# Patient Record
Sex: Female | Born: 1955 | Race: Black or African American | Hispanic: No | Smoking: Former smoker
Health system: Southern US, Community
[De-identification: ages and names within clinical notes are randomized; demographics above are authoritative.]

## PROBLEM LIST (undated history)

## (undated) DIAGNOSIS — I671 Cerebral aneurysm, nonruptured: Secondary | ICD-10-CM

## (undated) DIAGNOSIS — M1990 Unspecified osteoarthritis, unspecified site: Secondary | ICD-10-CM

## (undated) DIAGNOSIS — F329 Major depressive disorder, single episode, unspecified: Secondary | ICD-10-CM

## (undated) DIAGNOSIS — I10 Essential (primary) hypertension: Secondary | ICD-10-CM

## (undated) DIAGNOSIS — F32A Depression, unspecified: Secondary | ICD-10-CM

## (undated) HISTORY — PX: BRAIN SURGERY: SHX531

---

## 2011-01-01 ENCOUNTER — Emergency Department (INDEPENDENT_AMBULATORY_CARE_PROVIDER_SITE_OTHER)
Admission: EM | Admit: 2011-01-01 | Discharge: 2011-01-01 | Disposition: A | Discharge status: Home / Self Care | Payer: Medicare Other | Source: Home / Self Care | Attending: Family Medicine | Admitting: Family Medicine

## 2011-01-01 ENCOUNTER — Encounter: Payer: Self-pay | Admitting: *Deleted

## 2011-01-01 DIAGNOSIS — E119 Type 2 diabetes mellitus without complications: Secondary | ICD-10-CM

## 2011-01-01 DIAGNOSIS — I10 Essential (primary) hypertension: Secondary | ICD-10-CM

## 2011-01-01 HISTORY — DX: Essential (primary) hypertension: I10

## 2011-01-01 HISTORY — DX: Major depressive disorder, single episode, unspecified: F32.9

## 2011-01-01 HISTORY — DX: Cerebral aneurysm, nonruptured: I67.1

## 2011-01-01 HISTORY — DX: Depression, unspecified: F32.A

## 2011-01-01 MED ORDER — METFORMIN HCL 850 MG PO TABS: 850.0000 mg | ORAL_TABLET | Freq: Two times a day (BID) | ORAL | Status: DC

## 2011-01-01 NOTE — ED Notes (Signed)
Requesting refill for Metformin - new to [place], no PCP, almost out of med.  Has not checked CBGs.

## 2011-01-01 NOTE — ED Provider Notes (Signed)
Linda Chase is a 55 year old woman with past medical history significant for hypertension, diabetes, depression and, history of cerebral aneurysm status post surgery who presents to clinic tonight to refill her metformin.  She moved from [place] one and a half months ago and is about to run out of metformin. She has no complaints today and feels well otherwise. She can't remember last blood sugars have been. She denies any polyuria or polydipsia or symptomatic hypoglycemic episodes. Additionally she denies any history of renal disease. She has tried: Several doctors in town to get an appointment however cannot find one that is taking new patients.    PMH reviewed.  ROS as above otherwise neg Medications reviewed. No current facility-administered medications for this encounter.   Current Outpatient Prescriptions  Medication Sig Dispense Refill  . HYDROCHLOROTHIAZIDE PO Take by mouth.        . labetalol (NORMODYNE) 200 MG tablet Take 200 mg by mouth 2 (two) times daily.        . metFORMIN (GLUCOPHAGE) 850 MG tablet Take 850 mg by mouth 2 (two) times daily with a meal.        . UNKNOWN TO PATIENT Antidepressant & pain pill for arthritis       . metFORMIN (GLUCOPHAGE) 850 MG tablet Take 1 tablet (850 mg total) by mouth 2 (two) times daily with a meal.  60 tablet  2    Exam:  BP 157/83  Pulse 82  Temp(Src) 98.7 F (37.1 C) (Oral)  Resp 20  SpO2 100% Gen: Well NAD, obese HEENT: EOMI,  MMM Lungs: CTABL Nl WOB Heart: RRR no MRG Abd: NABS, NT, ND Exts: Non edematous BL  LE, warm and well perfused.   Assessment and plan: 55 year old with diabetes and hypertension. 1) Will refill metformin for diabetes. Encouraged her to establish with primary care provider. 2) encourage her to continue taking her hypertension medicines and followup with her primary care provider.  Provided her the number to [facility] to get a doctor.  Handout on diabetes and hypertension given  Clementeen Graham 01/01/11  1950

## 2011-01-02 NOTE — ED Provider Notes (Signed)
Medical screening examination/treatment/procedure(s) were performed by resident physician and as supervising physician I was immediately available for consultation/collaboration.   [facility]; MD   Sharin Grave, MD 01/02/11 [phone]

## 2011-04-22 ENCOUNTER — Other Ambulatory Visit (HOSPITAL_COMMUNITY): Payer: Self-pay | Admitting: Internal Medicine

## 2011-04-22 DIAGNOSIS — Z1231 Encounter for screening mammogram for malignant neoplasm of breast: Secondary | ICD-10-CM

## 2011-05-19 ENCOUNTER — Ambulatory Visit (HOSPITAL_COMMUNITY)
Admission: RE | Admit: 2011-05-19 | Discharge: 2011-05-19 | Disposition: A | Discharge status: Home / Self Care | Payer: Medicare Other | Source: Ambulatory Visit | Attending: Internal Medicine | Admitting: Internal Medicine

## 2011-05-19 DIAGNOSIS — Z1231 Encounter for screening mammogram for malignant neoplasm of breast: Secondary | ICD-10-CM | POA: Insufficient documentation

## 2012-02-01 ENCOUNTER — Emergency Department (HOSPITAL_COMMUNITY): Payer: Medicare Other

## 2012-02-01 ENCOUNTER — Encounter (HOSPITAL_COMMUNITY): Payer: Self-pay | Admitting: Emergency Medicine

## 2012-02-01 ENCOUNTER — Inpatient Hospital Stay (HOSPITAL_COMMUNITY)
Admission: EM | Admit: 2012-02-01 | Discharge: 2012-02-05 | DRG: 871 | Disposition: A | Discharge status: Home / Self Care | Payer: Medicare Other | Attending: Internal Medicine | Admitting: Internal Medicine

## 2012-02-01 ENCOUNTER — Inpatient Hospital Stay (HOSPITAL_COMMUNITY): Payer: Medicare Other

## 2012-02-01 DIAGNOSIS — A419 Sepsis, unspecified organism: Principal | ICD-10-CM | POA: Diagnosis present

## 2012-02-01 DIAGNOSIS — E669 Obesity, unspecified: Secondary | ICD-10-CM | POA: Diagnosis present

## 2012-02-01 DIAGNOSIS — F329 Major depressive disorder, single episode, unspecified: Secondary | ICD-10-CM

## 2012-02-01 DIAGNOSIS — R6510 Systemic inflammatory response syndrome (SIRS) of non-infectious origin without acute organ dysfunction: Secondary | ICD-10-CM | POA: Diagnosis present

## 2012-02-01 DIAGNOSIS — F17200 Nicotine dependence, unspecified, uncomplicated: Secondary | ICD-10-CM | POA: Diagnosis present

## 2012-02-01 DIAGNOSIS — J101 Influenza due to other identified influenza virus with other respiratory manifestations: Secondary | ICD-10-CM

## 2012-02-01 DIAGNOSIS — R0689 Other abnormalities of breathing: Secondary | ICD-10-CM

## 2012-02-01 DIAGNOSIS — J9602 Acute respiratory failure with hypercapnia: Secondary | ICD-10-CM | POA: Diagnosis present

## 2012-02-01 DIAGNOSIS — J9620 Acute and chronic respiratory failure, unspecified whether with hypoxia or hypercapnia: Secondary | ICD-10-CM | POA: Diagnosis present

## 2012-02-01 DIAGNOSIS — Z6841 Body Mass Index (BMI) 40.0 and over, adult: Secondary | ICD-10-CM

## 2012-02-01 DIAGNOSIS — E662 Morbid (severe) obesity with alveolar hypoventilation: Secondary | ICD-10-CM | POA: Diagnosis present

## 2012-02-01 DIAGNOSIS — I10 Essential (primary) hypertension: Secondary | ICD-10-CM | POA: Diagnosis present

## 2012-02-01 DIAGNOSIS — J449 Chronic obstructive pulmonary disease, unspecified: Secondary | ICD-10-CM

## 2012-02-01 DIAGNOSIS — G4733 Obstructive sleep apnea (adult) (pediatric): Secondary | ICD-10-CM | POA: Diagnosis present

## 2012-02-01 DIAGNOSIS — J441 Chronic obstructive pulmonary disease with (acute) exacerbation: Secondary | ICD-10-CM | POA: Diagnosis present

## 2012-02-01 DIAGNOSIS — R0902 Hypoxemia: Secondary | ICD-10-CM | POA: Diagnosis present

## 2012-02-01 DIAGNOSIS — R5381 Other malaise: Secondary | ICD-10-CM | POA: Diagnosis present

## 2012-02-01 DIAGNOSIS — F32A Depression, unspecified: Secondary | ICD-10-CM | POA: Diagnosis present

## 2012-02-01 DIAGNOSIS — J9600 Acute respiratory failure, unspecified whether with hypoxia or hypercapnia: Secondary | ICD-10-CM

## 2012-02-01 DIAGNOSIS — E119 Type 2 diabetes mellitus without complications: Secondary | ICD-10-CM | POA: Diagnosis present

## 2012-02-01 LAB — CBC WITH DIFFERENTIAL/PLATELET
Basophils Relative: 0 % (ref 0–1)
Hemoglobin: 11.9 g/dL — ABNORMAL LOW (ref 12.0–15.0)
Lymphocytes Relative: 8 % — ABNORMAL LOW (ref 12–46)
Lymphs Abs: 0.8 10*3/uL (ref 0.7–4.0)
MCHC: 32.2 g/dL (ref 30.0–36.0)
Monocytes Relative: 6 % (ref 3–12)
Neutro Abs: 8.6 10*3/uL — ABNORMAL HIGH (ref 1.7–7.7)
Neutrophils Relative %: 86 % — ABNORMAL HIGH (ref 43–77)
RBC: 4.37 MIL/uL (ref 3.87–5.11)
WBC: 10 10*3/uL (ref 4.0–10.5)

## 2012-02-01 LAB — BLOOD GAS, ARTERIAL
Bicarbonate: 26.9 mEq/L — ABNORMAL HIGH (ref 20.0–24.0)
Bicarbonate: 28.2 mEq/L — ABNORMAL HIGH (ref 20.0–24.0)
Delivery systems: POSITIVE
FIO2: 0.4 %
O2 Saturation: 94.8 %
O2 Saturation: 93.7 %
Patient temperature: 98.6
Patient temperature: 98.6
TCO2: 24.6 mmol/L (ref 0–100)
TCO2: 26 mmol/L (ref 0–100)
pH, Arterial: 7.316 — ABNORMAL LOW (ref 7.350–7.450)

## 2012-02-01 LAB — URINALYSIS, MICROSCOPIC ONLY
Glucose, UA: NEGATIVE mg/dL
Hgb urine dipstick: NEGATIVE
Specific Gravity, Urine: 1.027 (ref 1.005–1.030)
pH: 5.5 (ref 5.0–8.0)

## 2012-02-01 LAB — COMPREHENSIVE METABOLIC PANEL
Albumin: 3.2 g/dL — ABNORMAL LOW (ref 3.5–5.2)
Alkaline Phosphatase: 67 U/L (ref 39–117)
BUN: 12 mg/dL (ref 6–23)
Chloride: 97 mEq/L (ref 96–112)
Potassium: 3.8 mEq/L (ref 3.5–5.1)
Total Bilirubin: 0.3 mg/dL (ref 0.3–1.2)

## 2012-02-01 LAB — GLUCOSE, CAPILLARY: Glucose-Capillary: 163 mg/dL — ABNORMAL HIGH (ref 70–99)

## 2012-02-01 LAB — RAPID URINE DRUG SCREEN, HOSP PERFORMED
Benzodiazepines: NOT DETECTED
Cocaine: NOT DETECTED

## 2012-02-01 MED ORDER — LISINOPRIL 40 MG PO TABS
40.0000 mg | ORAL_TABLET | Freq: Every morning | ORAL | Status: DC
  Administered 2012-02-02 – 2012-02-05 (×4): 40 mg via ORAL
  Filled 2012-02-01 (×4): qty 1

## 2012-02-01 MED ORDER — SODIUM CHLORIDE 0.9 % IV SOLN: 250.0000 mL | INTRAVENOUS | Status: DC | PRN

## 2012-02-01 MED ORDER — IPRATROPIUM BROMIDE 0.02 % IN SOLN
0.5000 mg | Freq: Four times a day (QID) | RESPIRATORY_TRACT | Status: DC
  Administered 2012-02-01 – 2012-02-02 (×5): 0.5 mg via RESPIRATORY_TRACT
  Filled 2012-02-01 (×5): qty 2.5

## 2012-02-01 MED ORDER — ALBUTEROL SULFATE (5 MG/ML) 0.5% IN NEBU
2.5000 mg | INHALATION_SOLUTION | RESPIRATORY_TRACT | Status: DC
  Administered 2012-02-01 – 2012-02-03 (×8): 2.5 mg via RESPIRATORY_TRACT
  Filled 2012-02-01 (×8): qty 0.5

## 2012-02-01 MED ORDER — ACETAMINOPHEN 650 MG RE SUPP: 650.0000 mg | Freq: Four times a day (QID) | RECTAL | Status: DC | PRN

## 2012-02-01 MED ORDER — DEXTROSE 5 % IV SOLN
1.0000 g | Freq: Two times a day (BID) | INTRAVENOUS | Status: DC
  Administered 2012-02-02: 1 g via INTRAVENOUS
  Filled 2012-02-01: qty 1

## 2012-02-01 MED ORDER — IOHEXOL 350 MG/ML SOLN
100.0000 mL | Freq: Once | INTRAVENOUS | Status: AC | PRN
Start: 2012-02-01 — End: 2012-02-01
  Administered 2012-02-01: 100 mL via INTRAVENOUS

## 2012-02-01 MED ORDER — INSULIN ASPART 100 UNIT/ML ~~LOC~~ SOLN
0.0000 [IU] | Freq: Three times a day (TID) | SUBCUTANEOUS | Status: DC
  Administered 2012-02-02 (×3): 3 [IU] via SUBCUTANEOUS
  Administered 2012-02-03: 5 [IU] via SUBCUTANEOUS
  Administered 2012-02-03 (×2): 3 [IU] via SUBCUTANEOUS

## 2012-02-01 MED ORDER — SODIUM CHLORIDE 0.9 % IV SOLN
INTRAVENOUS | Status: DC
  Administered 2012-02-01: 17:00:00 via INTRAVENOUS

## 2012-02-01 MED ORDER — SODIUM CHLORIDE 0.9 % IV SOLN
INTRAVENOUS | Status: DC
  Administered 2012-02-01 (×2): via INTRAVENOUS

## 2012-02-01 MED ORDER — SODIUM CHLORIDE 0.9 % IJ SOLN
3.0000 mL | Freq: Two times a day (BID) | INTRAMUSCULAR | Status: DC
  Administered 2012-02-01 – 2012-02-05 (×3): 3 mL via INTRAVENOUS

## 2012-02-01 MED ORDER — FUROSEMIDE 10 MG/ML IJ SOLN
40.0000 mg | Freq: Once | INTRAMUSCULAR | Status: AC
  Administered 2012-02-01: 40 mg via INTRAVENOUS
  Filled 2012-02-01: qty 4

## 2012-02-01 MED ORDER — NICOTINE 21 MG/24HR TD PT24
21.0000 mg | MEDICATED_PATCH | Freq: Every day | TRANSDERMAL | Status: DC
  Administered 2012-02-01 – 2012-02-05 (×5): 21 mg via TRANSDERMAL
  Filled 2012-02-01 (×5): qty 1

## 2012-02-01 MED ORDER — ACETAMINOPHEN 325 MG PO TABS
650.0000 mg | ORAL_TABLET | Freq: Four times a day (QID) | ORAL | Status: DC | PRN
  Administered 2012-02-01 – 2012-02-03 (×2): 650 mg via ORAL
  Filled 2012-02-01 (×2): qty 2

## 2012-02-01 MED ORDER — DEXTROSE 5 % IV SOLN
1.0000 g | Freq: Once | INTRAVENOUS | Status: AC
  Administered 2012-02-01: 1 g via INTRAVENOUS
  Filled 2012-02-01 (×2): qty 1

## 2012-02-01 MED ORDER — DEXTROSE 5 % IV SOLN: 1.0000 g | Freq: Two times a day (BID) | INTRAVENOUS | Status: DC

## 2012-02-01 MED ORDER — SODIUM CHLORIDE 0.9 % IJ SOLN
3.0000 mL | INTRAMUSCULAR | Status: DC | PRN
  Administered 2012-02-04: 3 mL via INTRAVENOUS

## 2012-02-01 MED ORDER — SODIUM CHLORIDE 0.9 % IV BOLUS (SEPSIS)
250.0000 mL | Freq: Once | INTRAVENOUS | Status: AC
  Administered 2012-02-01: 250 mL via INTRAVENOUS

## 2012-02-01 MED ORDER — VANCOMYCIN HCL 10 G IV SOLR
1250.0000 mg | Freq: Two times a day (BID) | INTRAVENOUS | Status: DC
  Administered 2012-02-02 – 2012-02-03 (×3): 1250 mg via INTRAVENOUS
  Filled 2012-02-01 (×3): qty 1250

## 2012-02-01 MED ORDER — ALBUTEROL SULFATE (5 MG/ML) 0.5% IN NEBU: 2.5000 mg | INHALATION_SOLUTION | RESPIRATORY_TRACT | Status: DC | PRN

## 2012-02-01 MED ORDER — ACETAMINOPHEN 325 MG PO TABS: 650.0000 mg | ORAL_TABLET | Freq: Four times a day (QID) | ORAL | Status: DC | PRN

## 2012-02-01 MED ORDER — ALBUTEROL SULFATE (5 MG/ML) 0.5% IN NEBU
2.5000 mg | INHALATION_SOLUTION | RESPIRATORY_TRACT | Status: DC
  Administered 2012-02-01: 2.5 mg via RESPIRATORY_TRACT

## 2012-02-01 MED ORDER — VANCOMYCIN HCL 10 G IV SOLR
2500.0000 mg | Freq: Once | INTRAVENOUS | Status: AC
  Administered 2012-02-01: 2500 mg via INTRAVENOUS
  Filled 2012-02-01: qty 2500

## 2012-02-01 MED ORDER — LABETALOL HCL 200 MG PO TABS
200.0000 mg | ORAL_TABLET | Freq: Two times a day (BID) | ORAL | Status: DC
  Administered 2012-02-01 – 2012-02-05 (×8): 200 mg via ORAL
  Filled 2012-02-01 (×10): qty 1

## 2012-02-01 MED ORDER — SODIUM CHLORIDE 0.9 % IJ SOLN
3.0000 mL | Freq: Two times a day (BID) | INTRAMUSCULAR | Status: DC
  Administered 2012-02-01 – 2012-02-04 (×3): 3 mL via INTRAVENOUS

## 2012-02-01 MED ORDER — METHYLPREDNISOLONE SODIUM SUCC 125 MG IJ SOLR
80.0000 mg | Freq: Three times a day (TID) | INTRAMUSCULAR | Status: DC
  Administered 2012-02-01 – 2012-02-03 (×5): 80 mg via INTRAVENOUS
  Filled 2012-02-01 (×8): qty 1.28

## 2012-02-01 MED ORDER — ENOXAPARIN SODIUM 40 MG/0.4ML ~~LOC~~ SOLN
40.0000 mg | SUBCUTANEOUS | Status: DC
  Administered 2012-02-01 – 2012-02-04 (×4): 40 mg via SUBCUTANEOUS
  Filled 2012-02-01 (×6): qty 0.4

## 2012-02-01 NOTE — ED Provider Notes (Signed)
History     CSN: [id number]  Arrival date & time 02/01/12  1115   First MD Initiated Contact with Patient 02/01/12 1146      Chief Complaint  Patient presents with  . Shortness of Breath    (Consider location/radiation/quality/duration/timing/severity/associated sxs/prior treatment) The history is provided by the patient. The history is limited by the condition of the patient.   patient 57 year old the female in by EMS for shortness of breath room air saturation was 88%. They placed her on oxygen oxygen level went up to 98%. Patient still breathing rapidly around 32 little tachycardic somnolent some limitation on the bili for answer questions however she will follow commands. Patient states that she started with a cough 2 days ago cut very short of breath today. Primary care doctor is Dr. Hanley Hays.  Past Medical History  Diagnosis Date  . Diabetes mellitus   . Depression   . Hypertension   . Cerebral aneurysm     x2    Past Surgical History  Procedure Date  . Brain surgery     double aneurysm    History reviewed. No pertinent family history.  History  Substance Use Topics  . Smoking status: Current Some Day Smoker  . Smokeless tobacco: Never Used  . Alcohol Use: No    OB History    Grav Para Term Preterm Abortions TAB SAB Ect Mult Living                  Review of Systems  Constitutional: Positive for fever and fatigue.  HENT: Negative for neck pain.   Eyes: Negative for visual disturbance.  Respiratory: Positive for shortness of breath.   Cardiovascular: Negative for chest pain.  Gastrointestinal: Negative for vomiting and abdominal pain.  Genitourinary: Negative for dysuria.  Musculoskeletal: Negative for back pain.  Skin: Negative for rash.  Neurological: Negative for headaches.  Hematological: Does not bruise/bleed easily.    Allergies  Review of patient's allergies indicates no known allergies.  Home Medications   Current Outpatient Rx  Name   Route  Sig  Dispense  Refill  . LABETALOL HCL 200 MG PO TABS   Oral   Take 200 mg by mouth 2 (two) times daily.           Marland Kitchen LISINOPRIL 40 MG PO TABS   Oral   Take 40 mg by mouth every morning.         Marland Kitchen METFORMIN HCL 850 MG PO TABS   Oral   Take 1,700 mg by mouth daily with breakfast.          . METFORMIN HCL 850 MG PO TABS   Oral   Take 1 tablet (850 mg total) by mouth 2 (two) times daily with a meal.   60 tablet   2     BP 160/90  Pulse 108  Temp 100.8 F (38.2 C) (Oral)  Resp 28  SpO2 100%  Physical Exam  Nursing note and vitals reviewed. Constitutional: She appears well-developed and well-nourished. No distress.  HENT:  Head: Normocephalic and atraumatic.  Eyes: Conjunctivae normal and EOM are normal. Pupils are equal, round, and reactive to light.  Neck: Normal range of motion.  Cardiovascular: Normal rate, regular rhythm and normal heart sounds.        Tachycardia around 108.  Pulmonary/Chest: Effort normal and breath sounds normal.  Abdominal: Soft. Bowel sounds are normal. There is no tenderness.  Musculoskeletal: Normal range of motion.  Neurological: She is alert.  Somnolent we'll follow commands.  Skin: Skin is warm. No rash noted.    ED Course  Procedures (including critical care time)  Labs Reviewed  CBC WITH DIFFERENTIAL - Abnormal; Notable for the following:    Hemoglobin 11.9 (*)     Neutrophils Relative 86 (*)     Neutro Abs 8.6 (*)     Lymphocytes Relative 8 (*)     All other components within normal limits  COMPREHENSIVE METABOLIC PANEL - Abnormal; Notable for the following:    Sodium 134 (*)     Glucose, Bld 148 (*)     Albumin 3.2 (*)     GFR calc non Af Amer 67 (*)     GFR calc Af Amer 78 (*)     All other components within normal limits  BLOOD GAS, ARTERIAL - Abnormal; Notable for the following:    pH, Arterial 7.347 (*)     pCO2 arterial 50.5 (*)     pO2, Arterial 76.8 (*)     Bicarbonate 26.9 (*)     All other  components within normal limits  URINALYSIS, MICROSCOPIC ONLY - Abnormal; Notable for the following:    Bacteria, UA FEW (*)     Squamous Epithelial / LPF FEW (*)     All other components within normal limits  GLUCOSE, CAPILLARY - Abnormal; Notable for the following:    Glucose-Capillary 148 (*)     All other components within normal limits  LACTIC ACID, PLASMA  URINE RAPID DRUG SCREEN (HOSP PERFORMED)  CULTURE, BLOOD (ROUTINE X 2)  CULTURE, BLOOD (ROUTINE X 2)  PRO B NATRIURETIC PEPTIDE   Dg Chest Portable 1 View  02/01/2012  *RADIOLOGY REPORT*  Clinical Data: Shortness of breath, hypertension  PORTABLE CHEST - 1 VIEW  Comparison: Portable exam 1152 hours without priors for comparison  Findings: Enlargement of cardiac silhouette. Tortuous aorta. Pulmonary vascular congestion. Lungs grossly clear without infiltrate, pleural effusion or pneumothorax. No acute osseous findings.  IMPRESSION: Enlargement of cardiac silhouette with minimal pulmonary vascular congestion. No acute abnormalities.   Original Report Authenticated By: Ulyses Southward, M.D.     Date: 02/01/2012  Rate: 108  Rhythm: sinus tachycardia  QRS Axis: normal  Intervals: normal  ST/T Wave abnormalities: normal  Conduction Disutrbances:left anterior fascicular block  Narrative Interpretation:   Old EKG Reviewed: none available    1. Hypoxia   2. Respiratory insufficiency     CRITICAL CARE Performed by: Shelda Jakes.   Total critical care time: 30  Critical care time was exclusive of separately billable procedures and treating other patients.  Critical care was necessary to treat or prevent imminent or life-threatening deterioration.  Critical care was time spent personally by me on the following activities: development of treatment plan with patient and/or surrogate as well as nursing, discussions with consultants, evaluation of patient's response to treatment, examination of patient, obtaining history from  patient or surrogate, ordering and performing treatments and interventions, ordering and review of laboratory studies, ordering and review of radiographic studies, pulse oximetry and re-evaluation of patient's condition.   MDM   The patient called EMS for shortness of breath and not feeling well. Rib at home on room air she was 88% she complaining of a cough for a couple days shortness of breath got worse today temperature here was 100.8 her primary care doctors Dr. August Luz. Patient does not normally use oxygen at home. Past medical history significant for diabetes and hypertension. Upon arrival to ED patient was breathing rapidly  about 32 times a minute. On oxygen she was satting 98%.  Blood gas was done which showed some mild acidosis and elevated PCO2 to 50. Patient placed on BiPAP and felt much better with significant improvement. Chest x-ray was negative for pneumonia. Urinalysis negative for urinary tract infection. Labs without significant abnormalities. No leukocytosis.  Discussed with the hospitalist team for admission they will admit to step down have added a BNP and CT angios for chest rule out pulmonary embolism for them. Also requested respiratory to do a repeat arterial blood gas.        Shelda Jakes, MD 02/01/12 [phone]

## 2012-02-01 NOTE — ED Notes (Signed)
Respiratory at bedside for eval

## 2012-02-01 NOTE — ED Notes (Signed)
EMS reports patient has had cough x 2 days.  Patient is diminished in the bases.  Patient denies fevers and pain.  Patient on 3L O2 via [place] upon arrival.

## 2012-02-01 NOTE — ED Notes (Signed)
Respiratory notified via phone

## 2012-02-01 NOTE — ED Notes (Signed)
Patient's RR 30, pulse 98, 99% on bipap.  Patient requesting to come off bipap.

## 2012-02-01 NOTE — ED Notes (Signed)
Patient transported to CT 

## 2012-02-01 NOTE — Progress Notes (Signed)
ANTIBIOTIC CONSULT NOTE - INITIAL  Pharmacy Consult for Vancomycin Indication: suspected pneumonia  No Known Allergies  Patient Measurements: Height: 5\' 4"  (162.6 cm) Weight: 279 lb 8.7 oz (126.8 kg) IBW/kg (Calculated) : 54.7  Adjusted Body Weight:   Vital Signs: Temp: 99.6 F (37.6 C) (01/21 1800) Temp src: Axillary (01/21 1800) BP: 172/96 mmHg (01/21 1800) Pulse Rate: 109  (01/21 1800) Intake/Output from previous day:   Intake/Output from this shift: Total I/O In: 500 [I.V.:500] Out: -   Labs:  Basename 02/01/12 1140  WBC 10.0  HGB 11.9*  PLT 240  LABCREA --  CREATININE 0.93   Estimated Creatinine Clearance: 89 ml/min (by C-G formula based on Cr of 0.93). No results found for this basename: VANCOTROUGH:2,VANCOPEAK:2,VANCORANDOM:2,GENTTROUGH:2,GENTPEAK:2,GENTRANDOM:2,TOBRATROUGH:2,TOBRAPEAK:2,TOBRARND:2,AMIKACINPEAK:2,AMIKACINTROU:2,AMIKACIN:2, in the last 72 hours   Microbiology: No results found for this or any previous visit (from the past 720 hour(s)).  Medical History: Past Medical History  Diagnosis Date  . Diabetes mellitus   . Depression   . Hypertension   . Cerebral aneurysm     x2    Medications:  Scheduled:    . sodium chloride   Intravenous STAT  . albuterol  2.5 mg Nebulization Q4H  . ceFEPime (MAXIPIME) IV  1 g Intravenous Once  . ceFEPime (MAXIPIME) IV  1 g Intravenous Q12H  . enoxaparin (LOVENOX) injection  40 mg Subcutaneous Q24H  . furosemide  40 mg Intravenous Once  . insulin aspart  0-15 Units Subcutaneous TID WC  . ipratropium  0.5 mg Nebulization Q6H  . labetalol  200 mg Oral BID  . lisinopril  40 mg Oral q morning - 10a  . methylPREDNISolone (SOLU-MEDROL) injection  80 mg Intravenous Q8H  . nicotine  21 mg Transdermal Daily  . [COMPLETED] sodium chloride  250 mL Intravenous Once  . [COMPLETED] sodium chloride  250 mL Intravenous Once  . sodium chloride  3 mL Intravenous Q12H  . sodium chloride  3 mL Intravenous Q12H  .  [DISCONTINUED] albuterol  2.5 mg Nebulization Q4H  . [DISCONTINUED] ceFEPime (MAXIPIME) IV  1 g Intravenous Q12H   Infusions:    . [DISCONTINUED] sodium chloride 100 mL/hr at 02/01/12 1645   PRN: sodium chloride, acetaminophen, acetaminophen, albuterol, [COMPLETED] iohexol, sodium chloride, [DISCONTINUED] acetaminophen, [DISCONTINUED] acetaminophen, [DISCONTINUED] albuterol Assessment:  57 yo F with suspected pneumonia  Sx-SOB,productive cough, chills x 2 days, temp 100.8 today  Goal of Therapy:  Vancomycin trough level 15-20 mcg/ml  Plan:  1.  Vancomycin loading dose 2500mg   ( for pt > 100kg) 2.  Vancomycin 1250mg  IV q 12 hours Measure antibiotic drug levels at steady state Follow up culture results(blood and sputum cultures)  Loletta Specter 02/01/2012,6:21 PM

## 2012-02-01 NOTE — H&P (Signed)
[facility] History and Physical  Linda Chase WUJ:[id number] DOB: [birth year] DOA: 02/01/2012  Referring physician: Dr. Deretha Emory PCP: Lonia Blood, MD   Chief Complaint:  Shortness of breath with productive cough and chills for 2 days  HPI:  57 year old obese African American female with history of hypertension, diabetes mellitus (on metformin), history of depression and cerebral aneurysm with repair presents with 2 day history of progressive shortness of breath at rest associated with cough with productive mucus. Patient informs being clearly days back when she suddenly started having the symptoms with dyspnea on rest associated with productive cough and chills. She denies any fever or body aches. Denies any headache, blurry vision, nasal congestion, chest pain, palpitations, abdominal pain, nausea, vomiting, bowel or urinary symptoms. She does complain of orthopnea. Denies any neck swellings. At baseline she is able to walk an hour a day. She denies monitoring her blood glucose regularly but he informs to be compliant with her medications. She continues to smoke one pack per day for past several years. In the ED patient was noted to be hypoxic in the 80s on room air and tachycardic. Patient was noted to have a temperature of 100.55F and tachypneic. She was placed on a BiPAP. A blood gas obtained showed pH of 7.3, PCO2 of 50.5 and PO2 of 78. Chest x-ray done showed minimal pulmonary vascular congestion. An EKG was unremarkable. [facility] called for admission to step down monitoring.  Review of Systems: (Positive symptoms in bold) Constitutional: Complains of chills, and fatigue, Denies fever, , diaphoresis, appetite change HEENT: Denies photophobia, eye pain, redness, hearing loss, ear pain, congestion, sore throat, rhinorrhea, sneezing, mouth sores, trouble swallowing, neck pain, neck stiffness and tinnitus.   Respiratory:  SOB, DOE, cough, chest tightness,  and wheezing.     Cardiovascular: Denies chest pain, palpitations and leg swelling.  Gastrointestinal: Denies nausea, vomiting, abdominal pain, diarrhea, constipation, blood in stool and abdominal distention.  Genitourinary: Denies dysuria, urgency, frequency, hematuria, flank pain and difficulty urinating.  Musculoskeletal: Denies myalgias, back pain, joint swelling, arthralgias and gait problem.  Skin: Denies pallor, rash and wound.  Neurological: Denies dizziness, seizures, syncope, weakness, light-headedness, numbness and headaches.  Hematological: Denies adenopathy. Easy bruising, personal or family bleeding history  Psychiatric/Behavioral: Denies suicidal ideation, mood changes, confusion, nervousness, sleep disturbance and agitation   Past Medical History  Diagnosis Date  . Diabetes mellitus   . Depression   . Hypertension   . Cerebral aneurysm     x2   Past Surgical History  Procedure Date  . Brain surgery     double aneurysm   Social History:  reports that she has been smoking.  She has never used smokeless tobacco. She reports that she does not drink alcohol or use illicit drugs.  No Known Allergies  History reviewed. No pertinent family history.  Prior to Admission medications   Medication Sig Start Date End Date Taking? Authorizing Provider  labetalol (NORMODYNE) 200 MG tablet Take 200 mg by mouth 2 (two) times daily.     Yes Historical Provider, MD  lisinopril (PRINIVIL,ZESTRIL) 40 MG tablet Take 40 mg by mouth every morning.   Yes Historical Provider, MD  metFORMIN (GLUCOPHAGE) 850 MG tablet Take 1,700 mg by mouth daily with breakfast.    Yes Historical Provider, MD  metFORMIN (GLUCOPHAGE) 850 MG tablet Take 1 tablet (850 mg total) by mouth 2 (two) times daily with a meal. 01/01/11 01/01/12  Rodolph Bong, MD    Physical Exam:  Ceasar Mons Vitals:  02/01/12 1117 02/01/12 1133 02/01/12 1329  BP:  160/84 160/90  Pulse:  107 108  Temp:  100.8 F (38.2 C)   TempSrc:  Oral   Resp:   32 28  SpO2: 99% 98% 100%    Constitutional: Vital signs reviewed.  Patient is an obese female lying in bed on nasal cannula with some respiratory distress and using accessory muscles of respiration. Head: Normocephalic and atraumatic Ear: TM normal bilaterally Mouth: no erythema or exudates, MMM Eyes: PERRL, EOMI, conjunctivae normal, No scleral icterus.  Neck: Supple, Trachea midline normal ROM, No JVD, mass, thyromegaly, or carotid bruit present.  Cardiovascular: RRR, S1 normal, S2 normal, no MRG, pulses symmetric and intact bilaterally Pulmonary/Chest: Increased work of breathing noted, diffuse wheezing bilaterally. Abdominal: Obese, Soft. Non-tender, non-distended, bowel sounds are normal, no masses, organomegaly, or guarding present.  GU: no CVA tenderness Musculoskeletal: No joint deformities, erythema, or stiffness, ROM full and no nontender Ext: Trace edema, no cyanosis, pulses palpable bilaterally (DP and PT) Hematology: no cervical, inginal, or axillary adenopathy.  Neurological: A&O x3, Strenght is normal and symmetric bilaterally, cranial nerve II-XII are grossly intact, no focal motor deficit, sensory intact to light touch bilaterally.  Skin: Warm, dry and intact. No rash, cyanosis, or clubbing.  Psychiatric: Normal mood and affect. speech and behavior is normal. Judgment and thought content normal. Cognition and memory are normal.   Labs on Admission:  Basic Metabolic Panel:  Lab 02/01/12 5784  NA 134*  K 3.8  CL 97  CO2 27  GLUCOSE 148*  BUN 12  CREATININE 0.93  CALCIUM 9.0  MG --  PHOS --   Liver Function Tests:  Lab 02/01/12 1140  AST 13  ALT 8  ALKPHOS 67  BILITOT 0.3  PROT 7.2  ALBUMIN 3.2*   No results found for this basename: LIPASE:5,AMYLASE:5 in the last 168 hours No results found for this basename: AMMONIA:5 in the last 168 hours CBC:  Lab 02/01/12 1140  WBC 10.0  NEUTROABS 8.6*  HGB 11.9*  HCT 36.9  MCV 84.4  PLT 240   Cardiac  Enzymes: No results found for this basename: CKTOTAL:5,CKMB:5,CKMBINDEX:5,TROPONINI:5 in the last 168 hours BNP: No components found with this basename: POCBNP:5 CBG:  Lab 02/01/12 1502  GLUCAP 148*    Radiological Exams on Admission: Dg Chest Portable 1 View  02/01/2012  *RADIOLOGY REPORT*  Clinical Data: Shortness of breath, hypertension  PORTABLE CHEST - 1 VIEW  Comparison: Portable exam 1152 hours without priors for comparison  Findings: Enlargement of cardiac silhouette. Tortuous aorta. Pulmonary vascular congestion. Lungs grossly clear without infiltrate, pleural effusion or pneumothorax. No acute osseous findings.  IMPRESSION: Enlargement of cardiac silhouette with minimal pulmonary vascular congestion. No acute abnormalities.   Original Report Authenticated By: Ulyses Southward, M.D.     EKG: Sinus tachycardia at 108, left anterior vascular block no old EKG to compare  Assessment/Plan Principal Problem:  *Acute respiratory failure with hypercapnia Patient admitted to step down for close monitoring She probably has a chronic respiratory failure with associated OSA/OHS and possible underlying COPD as well. -Blood gas showing hypercapnia. Chest x-ray in the ED unremarkable except for some pulmonary congestion. Ordered CT angio chest by  ED to rule out for PE. -Patient has been weaned off BiPAP in the ED and currently maintaining sats on 3-4 L. Will start her on IV steroid and scheduled and nebs. Given fever with associated shortness of breath and productive cough cannot rule out underlying pneumonia. Will cover her empirically  with IV vancomycin and cefepime. -Check flu PCR. -Will order nicotine patch. -Patient does give history of snoring and daytime sleepiness. Will need outpatient sleep studies. -Check proBNP. We'll obtain 2-D echo   Active Problems:  SIRS (systemic inflammatory response syndrome) Likely in the setting of underlying pneumonia/bronchitis. Check flu PCR. Patient  started empirically on IV vancomycin and Zosyn. Blood culture and sputum culture ordered. Check urine strep and Legionella antigen   Diabetes mellitus Hold metformin. Continue sliding scale insulin. Check hemoglobin A1c and lipid panel.   Hypertension Blood pressure stable. Continue home medications    Obesity Will get nutrition consult.  DVT prophylaxis: Subcutaneous Lovenox Diet: Diabetic  Code Status: full Family Communication: none at bedside Disposition Plan: home once stable  Eddie North [facility] Pager [phone]  If 7PM-7AM, please contact night-coverage [email or web address] Password [facility] 02/01/2012, 5:12 PM    Total time spent: 70 minutes

## 2012-02-01 NOTE — ED Notes (Signed)
Bed:WA12<BR> Expected date:<BR> Expected time:<BR> Means of arrival:<BR> Comments:<BR> ems 

## 2012-02-01 NOTE — ED Notes (Signed)
IV team at bedside 

## 2012-02-01 NOTE — ED Notes (Signed)
Hospitalist at bedside for eval.

## 2012-02-01 NOTE — ED Notes (Signed)
IV team paged.  

## 2012-02-01 NOTE — ED Notes (Signed)
RT notified of orders 

## 2012-02-02 DIAGNOSIS — F329 Major depressive disorder, single episode, unspecified: Secondary | ICD-10-CM

## 2012-02-02 DIAGNOSIS — R0902 Hypoxemia: Secondary | ICD-10-CM

## 2012-02-02 DIAGNOSIS — J449 Chronic obstructive pulmonary disease, unspecified: Secondary | ICD-10-CM

## 2012-02-02 DIAGNOSIS — I509 Heart failure, unspecified: Secondary | ICD-10-CM

## 2012-02-02 DIAGNOSIS — J441 Chronic obstructive pulmonary disease with (acute) exacerbation: Secondary | ICD-10-CM

## 2012-02-02 LAB — CBC
MCH: 26.8 pg (ref 26.0–34.0)
Platelets: 241 10*3/uL (ref 150–400)
RBC: 4.33 MIL/uL (ref 3.87–5.11)
RDW: 13.1 % (ref 11.5–15.5)
WBC: 8.4 10*3/uL (ref 4.0–10.5)

## 2012-02-02 LAB — GLUCOSE, CAPILLARY
Glucose-Capillary: 168 mg/dL — ABNORMAL HIGH (ref 70–99)
Glucose-Capillary: 194 mg/dL — ABNORMAL HIGH (ref 70–99)

## 2012-02-02 LAB — INFLUENZA PANEL BY PCR (TYPE A & B): Influenza B By PCR: NEGATIVE

## 2012-02-02 LAB — LEGIONELLA ANTIGEN, URINE: Legionella Antigen, Urine: NEGATIVE

## 2012-02-02 LAB — BASIC METABOLIC PANEL
Calcium: 8.9 mg/dL (ref 8.4–10.5)
Creatinine, Ser: 0.95 mg/dL (ref 0.50–1.10)
GFR calc non Af Amer: 66 mL/min — ABNORMAL LOW (ref >90)
Glucose, Bld: 177 mg/dL — ABNORMAL HIGH (ref 70–99)
Sodium: 139 mEq/L (ref 135–145)

## 2012-02-02 LAB — HIV ANTIBODY (ROUTINE TESTING W REFLEX): HIV: NONREACTIVE

## 2012-02-02 LAB — EXPECTORATED SPUTUM ASSESSMENT W GRAM STAIN, RFLX TO RESP C

## 2012-02-02 LAB — HEMOGLOBIN A1C: Hgb A1c MFr Bld: 7.3 % — ABNORMAL HIGH (ref <5.7)

## 2012-02-02 MED ORDER — VITAMINS A & D EX OINT
TOPICAL_OINTMENT | CUTANEOUS | Status: AC
  Administered 2012-02-02: via TOPICAL
  Filled 2012-02-02: qty 10

## 2012-02-02 MED ORDER — CEFEPIME HCL 2 G IJ SOLR
2.0000 g | Freq: Two times a day (BID) | INTRAMUSCULAR | Status: DC
  Administered 2012-02-02 – 2012-02-05 (×6): 2 g via INTRAVENOUS
  Filled 2012-02-02 (×7): qty 2

## 2012-02-02 MED ORDER — BIOTENE DRY MOUTH MT LIQD
15.0000 mL | Freq: Two times a day (BID) | OROMUCOSAL | Status: DC
  Administered 2012-02-02 – 2012-02-05 (×7): 15 mL via OROMUCOSAL

## 2012-02-02 MED ORDER — OSELTAMIVIR PHOSPHATE 75 MG PO CAPS
75.0000 mg | ORAL_CAPSULE | Freq: Two times a day (BID) | ORAL | Status: DC
  Administered 2012-02-02 – 2012-02-05 (×7): 75 mg via ORAL
  Filled 2012-02-02 (×9): qty 1

## 2012-02-02 NOTE — Progress Notes (Signed)
  Echocardiogram 2D Echocardiogram has been performed.  Zira Helinski 02/02/2012, 9:30 AM 

## 2012-02-02 NOTE — Progress Notes (Signed)
[facility] PROGRESS NOTE  Linda Chase XBJ:[id number] DOB: [birth year] DOA: 02/01/2012 PCP: Lonia Blood, MD  Assessment/Plan: Acute respiratory failure with hypercapnia  -Plan to transfer to telemetry unit if remains off of BiPAP -Likely due to COPD exacerbation -probably has a chronic respiratory failure with associated OSA/OHS and possible underlying COPD as well.  -Blood gas showing hypercapnia. Chest x-ray in the ED unremarkable except for some pulmonary congestion. -CT angiogram chest negative -Patient has been weaned off BiPAP in the ED  -Patient on BiPAP last night, and off this morning--monitor off -productive cough cannot rule out underlying pneumonia. Will cover her empirically with IV vancomycin and cefepime.  -Check flu PCR--awaiting results COPD exacerbation/continued tobacco use  -Will order nicotine patch.  -Patient does give history of snoring and daytime sleepiness. Will need outpatient sleep studies.  -Check proBNP. We'll obtain 2-D echo--pending -Tobacco cessation discussed SIRS (systemic inflammatory response syndrome)  Likely in the setting of underlying pneumonia/bronchitis.   -flu PCR--pending  -Patient started empirically on IV vancomycin and Zosyn. Blood culture and sputum culture ordered. -urine strep--- negative and Legionella antigen--pending  Diabetes mellitus  Hold metformin. Continue sliding scale insulin. Check hemoglobin A1c and lipid panel.  Hypertension  Blood pressure stable. Continue home medications  Obesity  nutrition consult.    Family Communication:   Pt at beside Disposition Plan:   Home when medically stable    Antibiotics:  Vancomycin/cefepime 02/01/2012>>>    Procedures/Studies: Ct Angio Chest Pe W/cm &/or Wo Cm  02/01/2012  *RADIOLOGY REPORT*  Clinical Data: Cough for 2 days.  CT ANGIOGRAPHY CHEST  Technique:  Multidetector CT imaging of the chest using the standard protocol during bolus administration of intravenous  contrast. Multiplanar reconstructed images including MIPs were obtained and reviewed to evaluate the vascular anatomy.  Contrast: OMNIPAQUE IOHEXOL 350 MG/ML SOLN  Comparison: Plain film chest 02/01/2012  Findings: The study is degraded by respiratory motion.  No pulmonary embolus is identified.  There is cardiomegaly.  No pleural or pericardial effusion.  There is no axillary lymphadenopathy.  Small right hilar node on image 34 measures 1.5 cm. No mediastinal lymphadenopathy is identified.  Lungs demonstrate only some dependent atelectatic change.  Incidentally imaged upper abdomen shows fatty infiltration of the liver.  No focal bony abnormality is identified.  IMPRESSION:  1.  Negative for pulmonary embolus or acute cardiopulmonary disease. 2.  Cardiomegaly. 3.  Fatty infiltration of the liver.   Original Report Authenticated By: Holley Dexter, M.D.    Dg Chest Portable 1 View  02/01/2012  *RADIOLOGY REPORT*  Clinical Data: Shortness of breath, hypertension  PORTABLE CHEST - 1 VIEW  Comparison: Portable exam 1152 hours without priors for comparison  Findings: Enlargement of cardiac silhouette. Tortuous aorta. Pulmonary vascular congestion. Lungs grossly clear without infiltrate, pleural effusion or pneumothorax. No acute osseous findings.  IMPRESSION: Enlargement of cardiac silhouette with minimal pulmonary vascular congestion. No acute abnormalities.   Original Report Authenticated By: Ulyses Southward, M.D.          Subjective: Patient has been off of BiPAP since 7 AM--no respiratory distress. Feels that she is breathing 50% better. Feels the cough is improved. Denies any chest discomfort, fevers, chills, nausea, vomiting, diarrhea, abdominal pain.  Objective: Filed Vitals:   02/02/12 0424 02/02/12 0630 02/02/12 0800 02/02/12 0830  BP:    133/73  Pulse:  84 88   Temp:   98 F (36.7 C)   TempSrc:   Oral   Resp:  22 25   Height:  Weight:      SpO2: 100% 100% 98%      Intake/Output Summary (Last 24 hours) at 02/02/12 1610 Last data filed at 02/02/12 0848  Gross per 24 hour  Intake   1160 ml  Output   2445 ml  Net  -1285 ml   Weight change:  Exam:   General:  Pt is alert, follows commands appropriately, not in acute distress  HEENT: No icterus, No thrush,  [place]/AT  Cardiovascular: RRR, S1/S2, no rubs, no gallops  Respiratory: Scattered expiratory wheezing with rales. Good air movement.  Abdomen: Soft/+BS, non tender, non distended, no guarding  Extremities: No edema, No lymphangitis, No petechiae, No rashes, no synovitis  Data Reviewed: Basic Metabolic Panel:  Lab 02/02/12 9604 02/01/12 1140  NA 139 134*  K 4.1 3.8  CL 102 97  CO2 29 27  GLUCOSE 177* 148*  BUN 16 12  CREATININE 0.95 0.93  CALCIUM 8.9 9.0  MG -- --  PHOS -- --   Liver Function Tests:  Lab 02/01/12 1140  AST 13  ALT 8  ALKPHOS 67  BILITOT 0.3  PROT 7.2  ALBUMIN 3.2*   No results found for this basename: LIPASE:5,AMYLASE:5 in the last 168 hours No results found for this basename: AMMONIA:5 in the last 168 hours CBC:  Lab 02/02/12 0335 02/01/12 1140  WBC 8.4 10.0  NEUTROABS -- 8.6*  HGB 11.6* 11.9*  HCT 37.3 36.9  MCV 86.1 84.4  PLT 241 240   Cardiac Enzymes: No results found for this basename: CKTOTAL:5,CKMB:5,CKMBINDEX:5,TROPONINI:5 in the last 168 hours BNP: No components found with this basename: POCBNP:5 CBG:  Lab 02/02/12 0719 02/01/12 2149 02/01/12 1502  GLUCAP 151* 163* 148*    Recent Results (from the past 240 hour(s))  CULTURE, BLOOD (ROUTINE X 2)     Status: Normal (Preliminary result)   Collection Time   02/01/12 11:40 AM      Component Value Range Status Comment   Specimen Description BLOOD RIGHT ANTECUBITAL   Final    Special Requests BOTTLES DRAWN AEROBIC ONLY   Final    Culture  Setup Time 02/01/2012 17:04   Final    Culture     Final    Value:        BLOOD CULTURE RECEIVED NO GROWTH TO DATE CULTURE WILL BE HELD FOR 5  DAYS BEFORE ISSUING A FINAL NEGATIVE REPORT   Report Status PENDING   Incomplete   CULTURE, BLOOD (ROUTINE X 2)     Status: Normal (Preliminary result)   Collection Time   02/01/12 11:45 AM      Component Value Range Status Comment   Specimen Description BLOOD LEFT ANTECUBITAL   Final    Special Requests BOTTLES DRAWN AEROBIC AND ANAEROBIC   Final    Culture  Setup Time 02/01/2012 17:04   Final    Culture     Final    Value:        BLOOD CULTURE RECEIVED NO GROWTH TO DATE CULTURE WILL BE HELD FOR 5 DAYS BEFORE ISSUING A FINAL NEGATIVE REPORT   Report Status PENDING   Incomplete   MRSA PCR SCREENING     Status: Normal   Collection Time   02/01/12  6:03 PM      Component Value Range Status Comment   MRSA by PCR NEGATIVE  NEGATIVE Final   CULTURE, EXPECTORATED SPUTUM-ASSESSMENT     Status: Normal   Collection Time   02/02/12  6:20 AM  Component Value Range Status Comment   Specimen Description SPUTUM   Final    Special Requests Normal   Final    Sputum evaluation     Final    Value: THIS SPECIMEN IS ACCEPTABLE. RESPIRATORY CULTURE REPORT TO FOLLOW.   Report Status 02/02/2012 FINAL   Final      Scheduled Meds:   . albuterol  2.5 mg Nebulization Q4H  . antiseptic oral rinse  15 mL Mouth Rinse BID  . ceFEPime (MAXIPIME) IV  1 g Intravenous Q12H  . enoxaparin (LOVENOX) injection  40 mg Subcutaneous Q24H  . insulin aspart  0-15 Units Subcutaneous TID WC  . ipratropium  0.5 mg Nebulization Q6H  . labetalol  200 mg Oral BID  . lisinopril  40 mg Oral q morning - 10a  . methylPREDNISolone (SOLU-MEDROL) injection  80 mg Intravenous Q8H  . nicotine  21 mg Transdermal Daily  . sodium chloride  3 mL Intravenous Q12H  . sodium chloride  3 mL Intravenous Q12H  . vancomycin  1,250 mg Intravenous Q12H   Continuous Infusions:    Linda Longhi, DO  [facility] Pager [phone]  If 7PM-7AM, please contact night-coverage [email or web address] Password [email or web address] 02/02/2012, 9:04 AM   LOS: 1  day

## 2012-02-02 NOTE — Progress Notes (Signed)
Dr. Arbutus Leas notified of lab results +H1N1 and influenza A  Linda Chase

## 2012-02-02 NOTE — Progress Notes (Signed)
ANTIBIOTIC CONSULT NOTE   Pharmacy Consult for Vancomycin/Cefepime Indication: suspected pneumonia/AECOPD  No Known Allergies  Patient Measurements: Height: 5\' 4"  (162.6 cm) Weight: 279 lb 8.7 oz (126.8 kg) IBW/kg (Calculated) : 54.7  Adjusted Body Weight:   Vital Signs: Temp: 98 F (36.7 C) (01/22 0800) Temp src: Oral (01/22 0800) BP: 138/70 mmHg (01/22 1032) Pulse Rate: 93  (01/22 1000) Intake/Output from previous day: 01/21 0701 - 01/22 0700 In: 744 [I.V.:690; IV Piggyback:54] Out: 2445 [Urine:2445] Intake/Output from this shift: Total I/O In: 511 [P.O.:240; I.V.:10; IV Piggyback:261] Out: -   Labs:  Basename 02/02/12 0335 02/01/12 1140  WBC 8.4 10.0  HGB 11.6* 11.9*  PLT 241 240  LABCREA -- --  CREATININE 0.95 0.93   Estimated Creatinine Clearance: 87.2 ml/min (by C-G formula based on Cr of 0.95). No results found for this basename: VANCOTROUGH:2,VANCOPEAK:2,VANCORANDOM:2,GENTTROUGH:2,GENTPEAK:2,GENTRANDOM:2,TOBRATROUGH:2,TOBRAPEAK:2,TOBRARND:2,AMIKACINPEAK:2,AMIKACINTROU:2,AMIKACIN:2, in the last 72 hours   Microbiology: Recent Results (from the past 720 hour(s))  CULTURE, BLOOD (ROUTINE X 2)     Status: Normal (Preliminary result)   Collection Time   02/01/12 11:40 AM      Component Value Range Status Comment   Specimen Description BLOOD RIGHT ANTECUBITAL   Final    Special Requests BOTTLES DRAWN AEROBIC ONLY   Final    Culture  Setup Time 02/01/2012 17:04   Final    Culture     Final    Value:        BLOOD CULTURE RECEIVED NO GROWTH TO DATE CULTURE WILL BE HELD FOR 5 DAYS BEFORE ISSUING A FINAL NEGATIVE REPORT   Report Status PENDING   Incomplete   CULTURE, BLOOD (ROUTINE X 2)     Status: Normal (Preliminary result)   Collection Time   02/01/12 11:45 AM      Component Value Range Status Comment   Specimen Description BLOOD LEFT ANTECUBITAL   Final    Special Requests BOTTLES DRAWN AEROBIC AND ANAEROBIC   Final    Culture  Setup Time 02/01/2012  17:04   Final    Culture     Final    Value:        BLOOD CULTURE RECEIVED NO GROWTH TO DATE CULTURE WILL BE HELD FOR 5 DAYS BEFORE ISSUING A FINAL NEGATIVE REPORT   Report Status PENDING   Incomplete   MRSA PCR SCREENING     Status: Normal   Collection Time   02/01/12  6:03 PM      Component Value Range Status Comment   MRSA by PCR NEGATIVE  NEGATIVE Final   CULTURE, EXPECTORATED SPUTUM-ASSESSMENT     Status: Normal   Collection Time   02/02/12  6:20 AM      Component Value Range Status Comment   Specimen Description SPUTUM   Final    Special Requests Normal   Final    Sputum evaluation     Final    Value: THIS SPECIMEN IS ACCEPTABLE. RESPIRATORY CULTURE REPORT TO FOLLOW.   Report Status 02/02/2012 FINAL   Final     Medical History: Past Medical History  Diagnosis Date  . Diabetes mellitus   . Depression   . Hypertension   . Cerebral aneurysm     x2    Medications:  Scheduled:     . albuterol  2.5 mg Nebulization Q4H  . antiseptic oral rinse  15 mL Mouth Rinse BID  . [COMPLETED] ceFEPime (MAXIPIME) IV  1 g Intravenous Once  . ceFEPime (MAXIPIME) IV  1 g Intravenous  Q12H  . enoxaparin (LOVENOX) injection  40 mg Subcutaneous Q24H  . [COMPLETED] furosemide  40 mg Intravenous Once  . insulin aspart  0-15 Units Subcutaneous TID WC  . ipratropium  0.5 mg Nebulization Q6H  . labetalol  200 mg Oral BID  . lisinopril  40 mg Oral q morning - 10a  . methylPREDNISolone (SOLU-MEDROL) injection  80 mg Intravenous Q8H  . nicotine  21 mg Transdermal Daily  . [COMPLETED] sodium chloride  250 mL Intravenous Once  . [COMPLETED] sodium chloride  250 mL Intravenous Once  . sodium chloride  3 mL Intravenous Q12H  . sodium chloride  3 mL Intravenous Q12H  . vancomycin  1,250 mg Intravenous Q12H  . [COMPLETED] vancomycin  2,500 mg Intravenous Once  . [COMPLETED] vitamin A & D      . [COMPLETED] sodium chloride   Intravenous STAT  . [DISCONTINUED] albuterol  2.5 mg Nebulization Q4H  .  [DISCONTINUED] ceFEPime (MAXIPIME) IV  1 g Intravenous Q12H   Infusions:     . [DISCONTINUED] sodium chloride 100 mL/hr at 02/01/12 1645   PRN: sodium chloride, acetaminophen, acetaminophen, albuterol, [COMPLETED] iohexol, sodium chloride, [DISCONTINUED] acetaminophen, [DISCONTINUED] acetaminophen, [DISCONTINUED] albuterol Assessment: 58 YOF admitted with SOB, cough, and chills.  She was placed on BiPAP in ED (now off).  Broad spectrum abx started for possible PNA and AECOPD  1/ 21>>Vanc >> 1/21>>Cefepime >>   Tmax:103 WBCs:8.4 Renal:SCr 0.95, CrCl = 87, N = 75  1/21 blood: NG 1/21 sputum: pending S. pneumo Ag: neg Legionella Ag: pending  Goal of Therapy:  Vancomycin trough level 15-20 mcg/ml  Plan:   Due to body weight, change cefepime 2gm IV q12h  Continue vancomycin 1250mg  IV q12h, f/u need for vanco trough if continues  Dannielle Huh 02/02/2012,10:34 AM

## 2012-02-02 NOTE — Plan of Care (Signed)
Problem: ICU Phase Progression Outcomes Goal: Pain controlled with appropriate interventions Outcome: Not Met (add Reason) No pain

## 2012-02-02 NOTE — Progress Notes (Signed)
[id number]/Rhonda Davis, RN, BSN, CCM:  CHART REVIEWED AND UPDATED.  Next chart review due on [id number]. NO DISCHARGE NEEDS PRESENT AT THIS TIME. CASE MANAGEMENT [phone]  

## 2012-02-02 NOTE — Progress Notes (Signed)
  RD consulted for nutrition education regarding weight loss.  Body mass index is 47.98 kg/(m^2). Pt meets criteria for morbid obesity based on current BMI.  RD met with pt for nutrition education.  Pt denies nutrition-related concerns r/t to wt or diabetes management.  She states she does not check her blood sugars at home.  Pt reports she consumes 2 meals/day (10 am and 7pm).  Discussed importance of controlled and consistent intake throughout the day.  Emphasized the importance of hydration with calorie-free beverages and limiting sugar-sweetened beverages. Encouraged pt to discuss physical activity options with physician. Teach back method used.  Expect good compliance.  Current diet order is CHO Mod Med, patient is consuming approximately 100% of meals at this time. Labs and medications reviewed. No further nutrition interventions warranted at this time. RD contact information provided. If additional nutrition issues arise, please re-consult RD.  Loyce Dys, MS RD LDN Clinical Inpatient Dietitian Pager: [phone] Weekend/After hours pager: [phone]

## 2012-02-03 DIAGNOSIS — J101 Influenza due to other identified influenza virus with other respiratory manifestations: Secondary | ICD-10-CM

## 2012-02-03 LAB — CBC
Hemoglobin: 11.5 g/dL — ABNORMAL LOW (ref 12.0–15.0)
MCH: 27.1 pg (ref 26.0–34.0)
RBC: 4.25 MIL/uL (ref 3.87–5.11)

## 2012-02-03 LAB — BASIC METABOLIC PANEL
GFR calc Af Amer: >90 mL/min (ref >90)
GFR calc non Af Amer: >90 mL/min (ref >90)
Glucose, Bld: 250 mg/dL — ABNORMAL HIGH (ref 70–99)
Potassium: 4.4 mEq/L (ref 3.5–5.1)
Sodium: 135 mEq/L (ref 135–145)

## 2012-02-03 LAB — GLUCOSE, CAPILLARY
Glucose-Capillary: 201 mg/dL — ABNORMAL HIGH (ref 70–99)
Glucose-Capillary: 158 mg/dL — ABNORMAL HIGH (ref 70–99)

## 2012-02-03 MED ORDER — IPRATROPIUM BROMIDE 0.02 % IN SOLN
0.5000 mg | Freq: Four times a day (QID) | RESPIRATORY_TRACT | Status: DC
  Administered 2012-02-03 – 2012-02-05 (×9): 0.5 mg via RESPIRATORY_TRACT
  Filled 2012-02-03 (×9): qty 2.5

## 2012-02-03 MED ORDER — INSULIN ASPART 100 UNIT/ML ~~LOC~~ SOLN
0.0000 [IU] | Freq: Three times a day (TID) | SUBCUTANEOUS | Status: DC
  Administered 2012-02-03: 5 [IU] via SUBCUTANEOUS
  Administered 2012-02-04: 2 [IU] via SUBCUTANEOUS
  Administered 2012-02-04: 3 [IU] via SUBCUTANEOUS
  Administered 2012-02-04: 5 [IU] via SUBCUTANEOUS

## 2012-02-03 MED ORDER — ALBUTEROL SULFATE (5 MG/ML) 0.5% IN NEBU
2.5000 mg | INHALATION_SOLUTION | Freq: Four times a day (QID) | RESPIRATORY_TRACT | Status: DC
  Administered 2012-02-03: 2.5 mg via RESPIRATORY_TRACT
  Administered 2012-02-03: 21:00:00 via RESPIRATORY_TRACT
  Administered 2012-02-03 – 2012-02-05 (×7): 2.5 mg via RESPIRATORY_TRACT
  Filled 2012-02-03 (×9): qty 0.5

## 2012-02-03 MED ORDER — DOCUSATE SODIUM 100 MG PO CAPS
100.0000 mg | ORAL_CAPSULE | Freq: Every day | ORAL | Status: DC | PRN
  Administered 2012-02-03: 100 mg via ORAL
  Filled 2012-02-03: qty 1

## 2012-02-03 MED ORDER — PREDNISONE 50 MG PO TABS
50.0000 mg | ORAL_TABLET | Freq: Two times a day (BID) | ORAL | Status: DC
  Administered 2012-02-03 – 2012-02-04 (×3): 50 mg via ORAL
  Filled 2012-02-03 (×4): qty 1

## 2012-02-03 NOTE — Progress Notes (Signed)
[facility] PROGRESS NOTE  Navi Ewton AOZ:[id number] DOB: Sep 25, 1955 DOA: 02/01/2012 PCP: Lonia Blood, MD  Assessment/Plan: Acute respiratory failure with hypercapnia  -improved-->transfer to tele -Likely due to COPD exacerbation and influenza -probably has a chronic respiratory failure with associated OSA/OHS and possible underlying COPD as well.  -Blood gas showing hypercapnia. Chest x-ray in the ED unremarkable except for some pulmonary congestion.  -CT angiogram chest negative  -Patient has been weaned off BiPAP  Influenza -Complicated by COPD exacerbation -Suspect prolonged viral shedding do to concomitant steroid use -Oseltamivir started 02/02/2012 -Continue droplet isolation COPD exacerbation/continued tobacco use  -nicotine patch.  -Patient does give history of snoring and daytime sleepiness. Will need outpatient sleep studies.  -Discontinue methylprednisolone, start prednisone by mouth -Tobacco cessation discussed  -Discontinue vancomycin -Continue cefepime SIRS (systemic inflammatory response syndrome)  Likely in the setting of underlying pneumonia/bronchitis.  -Patient started empirically on IV vancomycin and Zosyn. Blood culture and sputum culture ordered.  -urine strep--- negative and Legionella antigen--pending  Diabetes mellitus  Hold metformin. Continue sliding scale insulin.  -Hemoglobin A1c 7.3 -Sugars elevated partly due to steroids -Will add pre-meal insulin his oral intake continues to improve Hypertension  Blood pressure stable. Continue home medications  Obesity  nutrition consult.  Deconditioning -PT evaluation -Discontinue Foley catheter      Family Communication:   Pt at beside Disposition Plan:   Home when medically stable   Antibiotics:  Oseltamivir 02/02/2012>>>  Vancomycin 02/01/2012>>> January 23  Cefepime 02/01/2012>>>    Procedures/Studies: Ct Angio Chest Pe W/cm &/or Wo Cm  02/01/2012  *RADIOLOGY REPORT*  Clinical  Data: Cough for 2 days.  CT ANGIOGRAPHY CHEST  Technique:  Multidetector CT imaging of the chest using the standard protocol during bolus administration of intravenous contrast. Multiplanar reconstructed images including MIPs were obtained and reviewed to evaluate the vascular anatomy.  Contrast: OMNIPAQUE IOHEXOL 350 MG/ML SOLN  Comparison: Plain film chest 02/01/2012  Findings: The study is degraded by respiratory motion.  No pulmonary embolus is identified.  There is cardiomegaly.  No pleural or pericardial effusion.  There is no axillary lymphadenopathy.  Small right hilar node on image 34 measures 1.5 cm. No mediastinal lymphadenopathy is identified.  Lungs demonstrate only some dependent atelectatic change.  Incidentally imaged upper abdomen shows fatty infiltration of the liver.  No focal bony abnormality is identified.  IMPRESSION:  1.  Negative for pulmonary embolus or acute cardiopulmonary disease. 2.  Cardiomegaly. 3.  Fatty infiltration of the liver.   Original Report Authenticated By: Holley Dexter, M.D.    Dg Chest Portable 1 View  02/01/2012  *RADIOLOGY REPORT*  Clinical Data: Shortness of breath, hypertension  PORTABLE CHEST - 1 VIEW  Comparison: Portable exam 1152 hours without priors for comparison  Findings: Enlargement of cardiac silhouette. Tortuous aorta. Pulmonary vascular congestion. Lungs grossly clear without infiltrate, pleural effusion or pneumothorax. No acute osseous findings.  IMPRESSION: Enlargement of cardiac silhouette with minimal pulmonary vascular congestion. No acute abnormalities.   Original Report Authenticated By: Ulyses Southward, M.D.          Subjective: Patient continues to complain of some dyspnea on exertion, but states that her breathing continues to improve. Denies fevers, chills, chest pain, nausea, vomiting, diarrhea, abdominal pain, rashes, dizziness.  Objective: Filed Vitals:   02/03/12 0400 02/03/12 0430 02/03/12 0800 02/03/12 0929  BP:   133/73 148/92   Pulse:  31 64   Temp: 97.6 F (36.4 C)  96.2 F (35.7 C)   TempSrc: Oral  Axillary  Resp:  21 17   Height:      Weight:      SpO2:  99% 96% 95%    Intake/Output Summary (Last 24 hours) at 02/03/12 0952 Last data filed at 02/03/12 0554  Gross per 24 hour  Intake   1390 ml  Output   1375 ml  Net     15 ml   Weight change:  Exam:   General:  Pt is alert, follows commands appropriately, not in acute distress  HEENT: No icterus, No thrush, [place]/AT  Cardiovascular: RRR, S1/S2, no rubs, no gallops  Respiratory: Bibasilar crackles. No wheezes or rhonchi. Good air movement.  Abdomen: Soft/+BS, non tender, non distended, no guarding  Extremities: No edema, No lymphangitis, No petechiae, No rashes, no synovitis  Data Reviewed: Basic Metabolic Panel:  Lab 02/03/12 1610 02/02/12 0335 02/01/12 1140  NA 135 139 134*  K 4.4 4.1 3.8  CL 99 102 97  CO2 28 29 27   GLUCOSE 250* 177* 148*  BUN 19 16 12   CREATININE 0.73 0.95 0.93  CALCIUM 8.9 8.9 9.0  MG -- -- --  PHOS -- -- --   Liver Function Tests:  Lab 02/01/12 1140  AST 13  ALT 8  ALKPHOS 67  BILITOT 0.3  PROT 7.2  ALBUMIN 3.2*   No results found for this basename: LIPASE:5,AMYLASE:5 in the last 168 hours No results found for this basename: AMMONIA:5 in the last 168 hours CBC:  Lab 02/03/12 0330 02/02/12 0335 02/01/12 1140  WBC 12.5* 8.4 10.0  NEUTROABS -- -- 8.6*  HGB 11.5* 11.6* 11.9*  HCT 36.2 37.3 36.9  MCV 85.2 86.1 84.4  PLT 232 241 240   Cardiac Enzymes: No results found for this basename: CKTOTAL:5,CKMB:5,CKMBINDEX:5,TROPONINI:5 in the last 168 hours BNP: No components found with this basename: POCBNP:5 CBG:  Lab 02/03/12 0742 02/02/12 2200 02/02/12 1720 02/02/12 1136 02/02/12 0719  GLUCAP 172* 201* 194* 168* 151*    Recent Results (from the past 240 hour(s))  CULTURE, BLOOD (ROUTINE X 2)     Status: Normal (Preliminary result)   Collection Time   02/01/12 11:40 AM       Component Value Range Status Comment   Specimen Description BLOOD RIGHT ANTECUBITAL   Final    Special Requests BOTTLES DRAWN AEROBIC ONLY   Final    Culture  Setup Time 02/01/2012 17:04   Final    Culture     Final    Value:        BLOOD CULTURE RECEIVED NO GROWTH TO DATE CULTURE WILL BE HELD FOR 5 DAYS BEFORE ISSUING A FINAL NEGATIVE REPORT   Report Status PENDING   Incomplete   CULTURE, BLOOD (ROUTINE X 2)     Status: Normal (Preliminary result)   Collection Time   02/01/12 11:45 AM      Component Value Range Status Comment   Specimen Description BLOOD LEFT ANTECUBITAL   Final    Special Requests BOTTLES DRAWN AEROBIC AND ANAEROBIC   Final    Culture  Setup Time 02/01/2012 17:04   Final    Culture     Final    Value:        BLOOD CULTURE RECEIVED NO GROWTH TO DATE CULTURE WILL BE HELD FOR 5 DAYS BEFORE ISSUING A FINAL NEGATIVE REPORT   Report Status PENDING   Incomplete   MRSA PCR SCREENING     Status: Normal   Collection Time   02/01/12  6:03 PM  Component Value Range Status Comment   MRSA by PCR NEGATIVE  NEGATIVE Final   CULTURE, EXPECTORATED SPUTUM-ASSESSMENT     Status: Normal   Collection Time   02/02/12  6:20 AM      Component Value Range Status Comment   Specimen Description SPUTUM   Final    Special Requests Normal   Final    Sputum evaluation     Final    Value: THIS SPECIMEN IS ACCEPTABLE. RESPIRATORY CULTURE REPORT TO FOLLOW.   Report Status 02/02/2012 FINAL   Final   CULTURE, RESPIRATORY     Status: Normal (Preliminary result)   Collection Time   02/02/12  6:20 AM      Component Value Range Status Comment   Specimen Description SPUTUM   Final    Special Requests NONE   Final    Gram Stain     Final    Value: MODERATE WBC PRESENT, PREDOMINANTLY PMN     MODERATE SQUAMOUS EPITHELIAL CELLS PRESENT     MODERATE GRAM POSITIVE RODS     MODERATE GRAM NEGATIVE RODS     MODERATE GRAM POSITIVE COCCI   Culture PENDING   Incomplete    Report Status PENDING    Incomplete      Scheduled Meds:   . albuterol  2.5 mg Nebulization Q6H  . antiseptic oral rinse  15 mL Mouth Rinse BID  . ceFEPime (MAXIPIME) IV  2 g Intravenous Q12H  . enoxaparin (LOVENOX) injection  40 mg Subcutaneous Q24H  . insulin aspart  0-15 Units Subcutaneous TID WC  . ipratropium  0.5 mg Nebulization Q6H  . labetalol  200 mg Oral BID  . lisinopril  40 mg Oral q morning - 10a  . nicotine  21 mg Transdermal Daily  . oseltamivir  75 mg Oral BID  . predniSONE  50 mg Oral BID WC  . sodium chloride  3 mL Intravenous Q12H  . sodium chloride  3 mL Intravenous Q12H   Continuous Infusions:    Arsalan Brisbin, DO  [facility] Pager [phone]  If 7PM-7AM, please contact night-coverage [email or web address] Password [facility] 02/03/2012, 9:52 AM   LOS: 2 days

## 2012-02-04 DIAGNOSIS — I10 Essential (primary) hypertension: Secondary | ICD-10-CM

## 2012-02-04 LAB — BASIC METABOLIC PANEL
BUN: 17 mg/dL (ref 6–23)
Chloride: 102 mEq/L (ref 96–112)
GFR calc Af Amer: >90 mL/min (ref >90)
GFR calc non Af Amer: >90 mL/min (ref >90)
Potassium: 4.2 mEq/L (ref 3.5–5.1)
Sodium: 137 mEq/L (ref 135–145)

## 2012-02-04 LAB — GLUCOSE, CAPILLARY
Glucose-Capillary: 121 mg/dL — ABNORMAL HIGH (ref 70–99)
Glucose-Capillary: 185 mg/dL — ABNORMAL HIGH (ref 70–99)

## 2012-02-04 LAB — CBC
HCT: 36.2 % (ref 36.0–46.0)
MCH: 27.3 pg (ref 26.0–34.0)
MCHC: 31.8 g/dL (ref 30.0–36.0)
MCV: 85.8 fL (ref 78.0–100.0)
RDW: 13.3 % (ref 11.5–15.5)

## 2012-02-04 LAB — CULTURE, RESPIRATORY W GRAM STAIN

## 2012-02-04 MED ORDER — AMLODIPINE BESYLATE 5 MG PO TABS: 5.0000 mg | ORAL_TABLET | Freq: Every day | ORAL | Status: DC

## 2012-02-04 MED ORDER — PREDNISONE 20 MG PO TABS
40.0000 mg | ORAL_TABLET | Freq: Two times a day (BID) | ORAL | Status: DC
  Administered 2012-02-05: 40 mg via ORAL
  Filled 2012-02-04 (×3): qty 2

## 2012-02-04 MED ORDER — INSULIN ASPART 100 UNIT/ML ~~LOC~~ SOLN
0.0000 [IU] | Freq: Three times a day (TID) | SUBCUTANEOUS | Status: DC
  Administered 2012-02-05: 3 [IU] via SUBCUTANEOUS

## 2012-02-04 MED ORDER — AMLODIPINE BESYLATE 5 MG PO TABS
5.0000 mg | ORAL_TABLET | ORAL | Status: DC
  Administered 2012-02-04: 5 mg via ORAL
  Filled 2012-02-04 (×3): qty 1

## 2012-02-04 NOTE — Progress Notes (Signed)
Patients bp has been trending up today. Last blood pressure taken was 178/92 manually with a heart rate ranging from 55-60 SB on monitor. Patient had an episode at 0907 when HR increased to 140 when ambulating to chair. Patient did not complain of shortness of breath nor pain at the time. MD was notified of findings.  Will continue to monitor patient. Setzer, Don Broach

## 2012-02-04 NOTE — Progress Notes (Signed)
[facility] PROGRESS NOTE  Linda Chase MWU:[id number] DOB: 1955-07-13 DOA: 02/01/2012 PCP: Lonia Blood, MD  Assessment/Plan: Acute respiratory failure with hypercapnia  -improved-->transfer to tele (1/23) -Likely due to COPD exacerbation and influenza  -probably has a chronic respiratory failure with associated OSA/OHS and possible underlying COPD as well.  -Blood gas showing hypercapnia. Chest x-ray in the ED unremarkable except for some pulmonary congestion.  -CT angiogram chest negative  -Patient has been weaned off BiPAP  -check ambulatory pulseox Influenza  -Complicated by COPD exacerbation  -Suspect prolonged viral shedding due to concomitant steroid use  -Oseltamivir started 02/02/2012  -Continue droplet isolation  COPD exacerbation/continued tobacco use  -nicotine patch.  -Patient does give history of snoring and daytime sleepiness. Will need outpatient sleep studies.  -Discontinue methylprednisolone, start prednisone by mouth  -wean prednisone to 40mg  bid -Tobacco cessation discussed  -Discontinue vancomycin  -Continue cefepime  SIRS (systemic inflammatory response syndrome)  -Likely in the setting of underlying pneumonia/bronchitis.  -Patient started empirically on IV vancomycin and Zosyn. Blood culture and sputum culture ordered.  -urine strep--- negative and Legionella antigen--pending  Diabetes mellitus  Hold metformin.  -elevated novolog SSI -Hemoglobin A1c 7.3  -Sugars elevated partly due to steroids  Hypertension  -manual BP during my exam 162/96 -Continue home medications  -add amlodipine Obesity  nutrition consult.  Deconditioning  -PT evaluation  -Discontinue Foley catheter      Family Communication:   Pt at beside Disposition Plan:   Home when medically stable     Procedures/Studies: Ct Angio Chest Pe W/cm &/or Wo Cm  02/01/2012  *RADIOLOGY REPORT*  Clinical Data: Cough for 2 days.  CT ANGIOGRAPHY CHEST  Technique:  Multidetector CT  imaging of the chest using the standard protocol during bolus administration of intravenous contrast. Multiplanar reconstructed images including MIPs were obtained and reviewed to evaluate the vascular anatomy.  Contrast: OMNIPAQUE IOHEXOL 350 MG/ML SOLN  Comparison: Plain film chest 02/01/2012  Findings: The study is degraded by respiratory motion.  No pulmonary embolus is identified.  There is cardiomegaly.  No pleural or pericardial effusion.  There is no axillary lymphadenopathy.  Small right hilar node on image 34 measures 1.5 cm. No mediastinal lymphadenopathy is identified.  Lungs demonstrate only some dependent atelectatic change.  Incidentally imaged upper abdomen shows fatty infiltration of the liver.  No focal bony abnormality is identified.  IMPRESSION:  1.  Negative for pulmonary embolus or acute cardiopulmonary disease. 2.  Cardiomegaly. 3.  Fatty infiltration of the liver.   Original Report Authenticated By: Holley Dexter, M.D.    Dg Chest Portable 1 View  02/01/2012  *RADIOLOGY REPORT*  Clinical Data: Shortness of breath, hypertension  PORTABLE CHEST - 1 VIEW  Comparison: Portable exam 1152 hours without priors for comparison  Findings: Enlargement of cardiac silhouette. Tortuous aorta. Pulmonary vascular congestion. Lungs grossly clear without infiltrate, pleural effusion or pneumothorax. No acute osseous findings.  IMPRESSION: Enlargement of cardiac silhouette with minimal pulmonary vascular congestion. No acute abnormalities.   Original Report Authenticated By: Ulyses Southward, M.D.          Subjective: Patient states that she is breathing 40-50% better. Denies fevers, chills, chest pain, nausea, vomiting, diarrhea, abdominal pain, dysuria, hematuria. Patient is eating 100% of her meals.  Objective: Filed Vitals:   02/04/12 0556 02/04/12 0952 02/04/12 1557 02/04/12 1729  BP: 150/71  171/98 178/92  Pulse: 59  63   Temp: 97.6 F (36.4 C)  97.4 F (36.3 C)   TempSrc: Oral  Oral   Resp: 18  18   Height:      Weight:      SpO2: 92% 91%      Intake/Output Summary (Last 24 hours) at 02/04/12 1756 Last data filed at 02/04/12 1557  Gross per 24 hour  Intake   1620 ml  Output      0 ml  Net   1620 ml   Weight change:  Exam:   General:  Pt is alert, follows commands appropriately, not in acute distress  HEENT: No icterus, No thrush, [place]/AT  Cardiovascular: RRR, S1/S2, no rubs, no gallops  Respiratory: Scattered basilar rales without any wheezing. No rhonchi. Good air movement.  Abdomen: Soft/+BS, non tender, non distended, no guarding  Extremities: No edema, No lymphangitis, No petechiae, No rashes, no synovitis  Data Reviewed: Basic Metabolic Panel:  Lab 02/04/12 4098 02/03/12 0330 02/02/12 0335 02/01/12 1140  NA 137 135 139 134*  K 4.2 4.4 4.1 3.8  CL 102 99 102 97  CO2 28 28 29 27   GLUCOSE 146* 250* 177* 148*  BUN 17 19 16 12   CREATININE 0.76 0.73 0.95 0.93  CALCIUM 8.8 8.9 8.9 9.0  MG -- -- -- --  PHOS -- -- -- --   Liver Function Tests:  Lab 02/01/12 1140  AST 13  ALT 8  ALKPHOS 67  BILITOT 0.3  PROT 7.2  ALBUMIN 3.2*   No results found for this basename: LIPASE:5,AMYLASE:5 in the last 168 hours No results found for this basename: AMMONIA:5 in the last 168 hours CBC:  Lab 02/04/12 0450 02/03/12 0330 02/02/12 0335 02/01/12 1140  WBC 13.3* 12.5* 8.4 10.0  NEUTROABS -- -- -- 8.6*  HGB 11.5* 11.5* 11.6* 11.9*  HCT 36.2 36.2 37.3 36.9  MCV 85.8 85.2 86.1 84.4  PLT 234 232 241 240   Cardiac Enzymes: No results found for this basename: CKTOTAL:5,CKMB:5,CKMBINDEX:5,TROPONINI:5 in the last 168 hours BNP: No components found with this basename: POCBNP:5 CBG:  Lab 02/04/12 1646 02/04/12 1205 02/04/12 0806 02/03/12 2110 02/03/12 1715  GLUCAP 217* 185* 121* 245* 158*    Recent Results (from the past 240 hour(s))  CULTURE, BLOOD (ROUTINE X 2)     Status: Normal (Preliminary result)   Collection Time   02/01/12 11:40 AM       Component Value Range Status Comment   Specimen Description BLOOD RIGHT ANTECUBITAL   Final    Special Requests BOTTLES DRAWN AEROBIC ONLY   Final    Culture  Setup Time 02/01/2012 17:04   Final    Culture     Final    Value:        BLOOD CULTURE RECEIVED NO GROWTH TO DATE CULTURE WILL BE HELD FOR 5 DAYS BEFORE ISSUING A FINAL NEGATIVE REPORT   Report Status PENDING   Incomplete   CULTURE, BLOOD (ROUTINE X 2)     Status: Normal (Preliminary result)   Collection Time   02/01/12 11:45 AM      Component Value Range Status Comment   Specimen Description BLOOD LEFT ANTECUBITAL   Final    Special Requests BOTTLES DRAWN AEROBIC AND ANAEROBIC   Final    Culture  Setup Time 02/01/2012 17:04   Final    Culture     Final    Value:        BLOOD CULTURE RECEIVED NO GROWTH TO DATE CULTURE WILL BE HELD FOR 5 DAYS BEFORE ISSUING A FINAL NEGATIVE REPORT   Report Status PENDING  Incomplete   MRSA PCR SCREENING     Status: Normal   Collection Time   02/01/12  6:03 PM      Component Value Range Status Comment   MRSA by PCR NEGATIVE  NEGATIVE Final   CULTURE, EXPECTORATED SPUTUM-ASSESSMENT     Status: Normal   Collection Time   02/02/12  6:20 AM      Component Value Range Status Comment   Specimen Description SPUTUM   Final    Special Requests Normal   Final    Sputum evaluation     Final    Value: THIS SPECIMEN IS ACCEPTABLE. RESPIRATORY CULTURE REPORT TO FOLLOW.   Report Status 02/02/2012 FINAL   Final   CULTURE, RESPIRATORY     Status: Normal   Collection Time   02/02/12  6:20 AM      Component Value Range Status Comment   Specimen Description SPUTUM   Final    Special Requests NONE   Final    Gram Stain     Final    Value: MODERATE WBC PRESENT, PREDOMINANTLY PMN     MODERATE SQUAMOUS EPITHELIAL CELLS PRESENT     MODERATE GRAM POSITIVE RODS     MODERATE GRAM NEGATIVE RODS     MODERATE GRAM POSITIVE COCCI   Culture NORMAL OROPHARYNGEAL FLORA   Final    Report Status 02/04/2012 FINAL    Final      Scheduled Meds:   . albuterol  2.5 mg Nebulization Q6H  . antiseptic oral rinse  15 mL Mouth Rinse BID  . ceFEPime (MAXIPIME) IV  2 g Intravenous Q12H  . enoxaparin (LOVENOX) injection  40 mg Subcutaneous Q24H  . insulin aspart  0-20 Units Subcutaneous TID WC  . ipratropium  0.5 mg Nebulization Q6H  . labetalol  200 mg Oral BID  . lisinopril  40 mg Oral q morning - 10a  . nicotine  21 mg Transdermal Daily  . oseltamivir  75 mg Oral BID  . predniSONE  50 mg Oral BID WC  . sodium chloride  3 mL Intravenous Q12H  . sodium chloride  3 mL Intravenous Q12H   Continuous Infusions:    Tatem Holsonback, DO  [facility] Pager [phone]  If 7PM-7AM, please contact night-coverage [email or web address] Password [facility] 02/04/2012, 5:56 PM   LOS: 3 days

## 2012-02-04 NOTE — Progress Notes (Signed)
Patient ambulated around nursing unit >75 feet. Oxygen remained at 94% on room air with some mild shortness of breath.  Patient resting at this time. Will continue to monitor. Setzer, Don Broach

## 2012-02-05 LAB — GLUCOSE, CAPILLARY: Glucose-Capillary: 139 mg/dL — ABNORMAL HIGH (ref 70–99)

## 2012-02-05 LAB — BASIC METABOLIC PANEL
BUN: 16 mg/dL (ref 6–23)
Creatinine, Ser: 0.74 mg/dL (ref 0.50–1.10)
GFR calc Af Amer: >90 mL/min (ref >90)
GFR calc non Af Amer: >90 mL/min (ref >90)
Glucose, Bld: 141 mg/dL — ABNORMAL HIGH (ref 70–99)
Potassium: 3.8 mEq/L (ref 3.5–5.1)

## 2012-02-05 MED ORDER — AMLODIPINE BESYLATE 5 MG PO TABS: 5.0000 mg | ORAL_TABLET | ORAL | Status: DC

## 2012-02-05 MED ORDER — PREDNISONE 10 MG PO TABS: ORAL_TABLET | ORAL | Status: DC

## 2012-02-05 MED ORDER — OSELTAMIVIR PHOSPHATE 75 MG PO CAPS: 75.0000 mg | ORAL_CAPSULE | Freq: Two times a day (BID) | ORAL | Status: DC

## 2012-02-05 NOTE — Progress Notes (Signed)
CSW was contacted by unit concerning transportation home.   Pt requested PTAR however Pt does not qualify for PTAR transport because Pt is ambulatory.   CSW offered a bus pass and Pt stated that she "does not ride the bus".   Pt then requested cab and CSW informed Pt were are unable to provide that at this time.   Pt stated "I will try and find a ride".   CSW will assist with further d/c planning as needed.   Leron Croak, LCSWA [employer] [phone]

## 2012-02-05 NOTE — Discharge Summary (Signed)
Physician Discharge Summary  Adeola Dennen ZOX:[id number] DOB: 04-26-1955 DOA: 02/01/2012  PCP: Lonia Blood, MD  Admit date: 02/01/2012 Discharge date: 02/05/2012  Recommendations for Outpatient Follow-up:  1. Pt will need to follow up with PCP in 2 weeks post discharge 2. Please obtain BMP to evaluate electrolytes and kidney function 3. Please also check CBC to evaluate Hg and Hct levels  Discharge Diagnoses:  Acute respiratory failure with hypercapnia  -Patient was hypoxemic and hypercarbic at time of admission. She required BiPAP and was admitted to step down unit. -Patient was started on empiric vancomycin and cefepime. In addition intravenous Solu-Medrol and around-the-clock bronchodilators were started. -Influenza PCR was positive. The patient was started on Tamiflu. -The patient clinically improved and was weaned off of BiPAP. She remained stable on nasal cannula. She was surgically transferred to telemetry bed. -Likely due to COPD exacerbation and influenza  -probably has a chronic respiratory failure with associated OSA/OHS and possible underlying COPD as well.  -Blood gas showing hypercapnia. Chest x-ray in the ED unremarkable except for some pulmonary congestion.  -CT angiogram chest negative for PE, Influenza  -Complicated by COPD exacerbation  -Suspect prolonged viral shedding do to concomitant steroid use  -Oseltamivir started 02/02/2012--she will complete her course of 5 days with 4 more doses after discharge -Continue droplet isolation  COPD exacerbation/continued tobacco use  -nicotine patch.  -Patient does give history of snoring and daytime sleepiness. Will need outpatient sleep studies.  -Discontinue methylprednisolone, start prednisone by mouth  -Tobacco cessation discussed  -Discontinue vancomycin  -Patient's prednisone will be weaned to 40mg  daily, then 30mg , then 20mg , then 10mg  SIRS (systemic inflammatory response syndrome)  -As the patient was treated with  steroids antimicrobials, fractionation as well as tachycardia improved. Likely in the setting of underlying pneumonia/bronchitis.  -Patient started empirically on IV vancomycin and Zosyn. Blood culture and sputum culture ordered.  -urine strep--- negative and Legionella antigen--pending  Diabetes mellitus  Hold metformin. Continue sliding scale insulin.  -Hemoglobin A1c 7.3  -Sugars elevated partly due to steroids  Hypertension  Blood pressure stable. Continue home medications -As patient clinically improved, her blood pressure did gradually elevate. -Amlodipine 5 mg was added to the patient's current regimen  -The patient will continue on her usual labetalol and lisinopril.  Deconditioning  -PT evaluation  -Discontinue Foley catheter   Discharge Condition: stable  Disposition:   Diet:carbohydrate modified Wt Readings from Last 3 Encounters:  02/03/12 126 kg (277 lb 12.5 oz)    History of present illness:  57 year old obese African American female with history of hypertension, diabetes mellitus (on metformin), history of depression and cerebral aneurysm with repair presents with 2 day history of progressive shortness of breath at rest associated with cough with productive mucus. Patient informs being clearly days back when she suddenly started having the symptoms with dyspnea on rest associated with productive cough and chills. She denies any fever or body aches. Denies any headache, blurry vision, nasal congestion, chest pain, palpitations, abdominal pain, nausea, vomiting, bowel or urinary symptoms. She does complain of orthopnea. Denies any neck swellings. At baseline she is able to walk an hour a day. She denies monitoring her blood glucose regularly but he informs to be compliant with her medications. She continues to smoke one pack per day for past several years.  In the ED patient was noted to be hypoxic in the 80s on room air and tachycardic. Patient was noted to have a  temperature of 100.73F and tachypneic. She was placed on a BiPAP.  A blood gas obtained showed pH of 7.3, PCO2 of 50.5 and PO2 of 78. Chest x-ray done showed minimal pulmonary vascular congestion. An EKG was unremarkable.  [facility] called for admission to step down monitoring      Discharge Exam: Filed Vitals:   02/05/12 0517  BP: 157/81  Pulse: 63  Temp: 97.9 F (36.6 C)  Resp: 18   Filed Vitals:   02/04/12 2119 02/05/12 0245 02/05/12 0517 02/05/12 0825  BP:   157/81   Pulse:   63   Temp:   97.9 F (36.6 C)   TempSrc:   Oral   Resp:   18   Height:      Weight:      SpO2: 97% 99% 100% 95%   General: A&O x 3, NAD, pleasant, cooperative Cardiovascular: RRR, no rub, no gallop, no S3 Respiratory: Diminished breath sounds at the bases. No wheezes or rhonchi. Good air movement. Abdomen:soft, nontender, nondistended, positive bowel sounds Extremities: No edema, No lymphangitis, no petechiae  Discharge Instructions      Discharge Orders    Future Orders Please Complete By Expires   Diet - low sodium heart healthy      Increase activity slowly      Discharge instructions      Comments:   Prednisone--take 30mg  (3 tablets) on 02/06/12;  Take 20mg  (2 tablets) on 02/07/12; Take 10mg  (1 tablet) on 02/08/12 Tamiflu--take one tablet twice a day, start tonight       Medication List     As of 02/05/2012  1:22 PM    TAKE these medications         amLODipine 5 MG tablet   Commonly known as: NORVASC   Take 1 tablet (5 mg total) by mouth daily.      labetalol 200 MG tablet   Commonly known as: NORMODYNE   Take 200 mg by mouth 2 (two) times daily.      lisinopril 40 MG tablet   Commonly known as: PRINIVIL,ZESTRIL   Take 40 mg by mouth every morning.      metFORMIN 850 MG tablet   Commonly known as: GLUCOPHAGE   Take 1,700 mg by mouth daily with breakfast.      metFORMIN 850 MG tablet   Commonly known as: GLUCOPHAGE   Take 1 tablet (850 mg total) by mouth 2 (two)  times daily with a meal.      oseltamivir 75 MG capsule   Commonly known as: TAMIFLU   Take 1 capsule (75 mg total) by mouth 2 (two) times daily.      predniSONE 10 MG tablet   Commonly known as: DELTASONE   Take 3 tablets 02/06/12 and decrease by one tablet daily           The results of significant diagnostics from this hospitalization (including imaging, microbiology, ancillary and laboratory) are listed below for reference.    Significant Diagnostic Studies: Ct Angio Chest Pe W/cm &/or Wo Cm  02/01/2012  *RADIOLOGY REPORT*  Clinical Data: Cough for 2 days.  CT ANGIOGRAPHY CHEST  Technique:  Multidetector CT imaging of the chest using the standard protocol during bolus administration of intravenous contrast. Multiplanar reconstructed images including MIPs were obtained and reviewed to evaluate the vascular anatomy.  Contrast: OMNIPAQUE IOHEXOL 350 MG/ML SOLN  Comparison: Plain film chest 02/01/2012  Findings: The study is degraded by respiratory motion.  No pulmonary embolus is identified.  There is cardiomegaly.  No pleural or pericardial effusion.  There is no axillary lymphadenopathy.  Small right hilar node on image 34 measures 1.5 cm. No mediastinal lymphadenopathy is identified.  Lungs demonstrate only some dependent atelectatic change.  Incidentally imaged upper abdomen shows fatty infiltration of the liver.  No focal bony abnormality is identified.  IMPRESSION:  1.  Negative for pulmonary embolus or acute cardiopulmonary disease. 2.  Cardiomegaly. 3.  Fatty infiltration of the liver.   Original Report Authenticated By: Holley Dexter, M.D.    Dg Chest Portable 1 View  02/01/2012  *RADIOLOGY REPORT*  Clinical Data: Shortness of breath, hypertension  PORTABLE CHEST - 1 VIEW  Comparison: Portable exam 1152 hours without priors for comparison  Findings: Enlargement of cardiac silhouette. Tortuous aorta. Pulmonary vascular congestion. Lungs grossly clear without infiltrate,  pleural effusion or pneumothorax. No acute osseous findings.  IMPRESSION: Enlargement of cardiac silhouette with minimal pulmonary vascular congestion. No acute abnormalities.   Original Report Authenticated By: Ulyses Southward, M.D.      Microbiology: Recent Results (from the past 240 hour(s))  CULTURE, BLOOD (ROUTINE X 2)     Status: Normal (Preliminary result)   Collection Time   02/01/12 11:40 AM      Component Value Range Status Comment   Specimen Description BLOOD RIGHT ANTECUBITAL   Final    Special Requests BOTTLES DRAWN AEROBIC ONLY   Final    Culture  Setup Time 02/01/2012 17:04   Final    Culture     Final    Value:        BLOOD CULTURE RECEIVED NO GROWTH TO DATE CULTURE WILL BE HELD FOR 5 DAYS BEFORE ISSUING A FINAL NEGATIVE REPORT   Report Status PENDING   Incomplete   CULTURE, BLOOD (ROUTINE X 2)     Status: Normal (Preliminary result)   Collection Time   02/01/12 11:45 AM      Component Value Range Status Comment   Specimen Description BLOOD LEFT ANTECUBITAL   Final    Special Requests BOTTLES DRAWN AEROBIC AND ANAEROBIC   Final    Culture  Setup Time 02/01/2012 17:04   Final    Culture     Final    Value:        BLOOD CULTURE RECEIVED NO GROWTH TO DATE CULTURE WILL BE HELD FOR 5 DAYS BEFORE ISSUING A FINAL NEGATIVE REPORT   Report Status PENDING   Incomplete   MRSA PCR SCREENING     Status: Normal   Collection Time   02/01/12  6:03 PM      Component Value Range Status Comment   MRSA by PCR NEGATIVE  NEGATIVE Final   CULTURE, EXPECTORATED SPUTUM-ASSESSMENT     Status: Normal   Collection Time   02/02/12  6:20 AM      Component Value Range Status Comment   Specimen Description SPUTUM   Final    Special Requests Normal   Final    Sputum evaluation     Final    Value: THIS SPECIMEN IS ACCEPTABLE. RESPIRATORY CULTURE REPORT TO FOLLOW.   Report Status 02/02/2012 FINAL   Final   CULTURE, RESPIRATORY     Status: Normal   Collection Time   02/02/12  6:20 AM       Component Value Range Status Comment   Specimen Description SPUTUM   Final    Special Requests NONE   Final    Gram Stain     Final    Value: MODERATE WBC PRESENT, PREDOMINANTLY PMN  MODERATE SQUAMOUS EPITHELIAL CELLS PRESENT     MODERATE GRAM POSITIVE RODS     MODERATE GRAM NEGATIVE RODS     MODERATE GRAM POSITIVE COCCI   Culture NORMAL OROPHARYNGEAL FLORA   Final    Report Status 02/04/2012 FINAL   Final      Labs: Basic Metabolic Panel:  Lab 02/05/12 4540 02/04/12 0450 02/03/12 0330 02/02/12 0335 02/01/12 1140  NA 139 137 135 139 134*  K 3.8 4.2 -- -- --  CL 104 102 99 102 97  CO2 30 28 28 29 27   GLUCOSE 141* 146* 250* 177* 148*  BUN 16 17 19 16 12   CREATININE 0.74 0.76 0.73 0.95 0.93  CALCIUM 9.1 8.8 8.9 8.9 9.0  MG -- -- -- -- --  PHOS -- -- -- -- --   Liver Function Tests:  Lab 02/01/12 1140  AST 13  ALT 8  ALKPHOS 67  BILITOT 0.3  PROT 7.2  ALBUMIN 3.2*   No results found for this basename: LIPASE:5,AMYLASE:5 in the last 168 hours No results found for this basename: AMMONIA:5 in the last 168 hours CBC:  Lab 02/04/12 0450 02/03/12 0330 02/02/12 0335 02/01/12 1140  WBC 13.3* 12.5* 8.4 10.0  NEUTROABS -- -- -- 8.6*  HGB 11.5* 11.5* 11.6* 11.9*  HCT 36.2 36.2 37.3 36.9  MCV 85.8 85.2 86.1 84.4  PLT 234 232 241 240   Cardiac Enzymes: No results found for this basename: CKTOTAL:5,CKMB:5,CKMBINDEX:5,TROPONINI:5 in the last 168 hours BNP: No components found with this basename: POCBNP:5 CBG:  Lab 02/05/12 1203 02/05/12 0724 02/04/12 2128 02/04/12 1646 02/04/12 1205  GLUCAP 139* 96 267* 217* 185*    Time coordinating discharge:  Greater than 30 minutes  Signed:  Kaslyn [place], DO [facility] Pager: [phone] 02/05/2012, 1:22 PM

## 2012-02-05 NOTE — Progress Notes (Signed)
Patient discharge to home, alert and oriented, ambulatory. PIV removed no s/s of infiltration or swelling on insertion site.D/c instruction done and given to patient.

## 2012-02-05 NOTE — Progress Notes (Signed)
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  No Known Allergies  Patient Measurements: Height: 5\' 4"  (162.6 cm) Weight: 277 lb 12.5 oz (126 kg) IBW/kg (Calculated) : 54.7    Vital Signs: Temp: 97.9 F (36.6 C) (01/25 0517) Temp src: Oral (01/25 0517) BP: 157/81 mmHg (01/25 0517) Pulse Rate: 63  (01/25 0517) Intake/Output from previous day: 01/24 0701 - 01/25 0700 In: 1400 [P.O.:1400] Out: -  Intake/Output from this shift:    Labs:  Basename 02/05/12 0439 02/04/12 0450 02/03/12 0330  WBC -- 13.3* 12.5*  HGB -- 11.5* 11.5*  PLT -- 234 232  LABCREA -- -- --  CREATININE 0.74 0.76 0.73   Estimated Creatinine Clearance: 103.1 ml/min (by C-G formula based on Cr of 0.74).    Microbiology: Recent Results (from the past 720 hour(s))  CULTURE, BLOOD (ROUTINE X 2)     Status: Normal (Preliminary result)   Collection Time   02/01/12 11:40 AM      Component Value Range Status Comment   Specimen Description BLOOD RIGHT ANTECUBITAL   Final    Special Requests BOTTLES DRAWN AEROBIC ONLY   Final    Culture  Setup Time 02/01/2012 17:04   Final    Culture     Final    Value:        BLOOD CULTURE RECEIVED NO GROWTH TO DATE CULTURE WILL BE HELD FOR 5 DAYS BEFORE ISSUING A FINAL NEGATIVE REPORT   Report Status PENDING   Incomplete   CULTURE, BLOOD (ROUTINE X 2)     Status: Normal (Preliminary result)   Collection Time   02/01/12 11:45 AM      Component Value Range Status Comment   Specimen Description BLOOD LEFT ANTECUBITAL   Final    Special Requests BOTTLES DRAWN AEROBIC AND ANAEROBIC   Final    Culture  Setup Time 02/01/2012 17:04   Final    Culture     Final    Value:        BLOOD CULTURE RECEIVED NO GROWTH TO DATE CULTURE WILL BE HELD FOR 5 DAYS BEFORE ISSUING A FINAL NEGATIVE REPORT   Report Status PENDING   Incomplete   MRSA PCR SCREENING     Status: Normal   Collection Time   02/01/12  6:03 PM      Component Value  Range Status Comment   MRSA by PCR NEGATIVE  NEGATIVE Final   CULTURE, EXPECTORATED SPUTUM-ASSESSMENT     Status: Normal   Collection Time   02/02/12  6:20 AM      Component Value Range Status Comment   Specimen Description SPUTUM   Final    Special Requests Normal   Final    Sputum evaluation     Final    Value: THIS SPECIMEN IS ACCEPTABLE. RESPIRATORY CULTURE REPORT TO FOLLOW.   Report Status 02/02/2012 FINAL   Final   CULTURE, RESPIRATORY     Status: Normal   Collection Time   02/02/12  6:20 AM      Component Value Range Status Comment   Specimen Description SPUTUM   Final    Special Requests NONE   Final    Gram Stain     Final    Value: MODERATE WBC PRESENT, PREDOMINANTLY PMN     MODERATE SQUAMOUS EPITHELIAL CELLS PRESENT     MODERATE GRAM POSITIVE RODS     MODERATE GRAM NEGATIVE RODS     MODERATE GRAM POSITIVE COCCI  Culture NORMAL OROPHARYNGEAL FLORA   Final    Report Status 02/04/2012 FINAL   Final     Medical History: Past Medical History  Diagnosis Date  . Diabetes mellitus   . Depression   . Hypertension   . Cerebral aneurysm     x2    Medications:  Scheduled:     . albuterol  2.5 mg Nebulization Q6H  . amLODipine  5 mg Oral Q24H  . antiseptic oral rinse  15 mL Mouth Rinse BID  . ceFEPime (MAXIPIME) IV  2 g Intravenous Q12H  . enoxaparin (LOVENOX) injection  40 mg Subcutaneous Q24H  . insulin aspart  0-20 Units Subcutaneous TID WC  . ipratropium  0.5 mg Nebulization Q6H  . labetalol  200 mg Oral BID  . lisinopril  40 mg Oral q morning - 10a  . nicotine  21 mg Transdermal Daily  . oseltamivir  75 mg Oral BID  . predniSONE  40 mg Oral BID WC  . sodium chloride  3 mL Intravenous Q12H  . sodium chloride  3 mL Intravenous Q12H  . [DISCONTINUED] amLODipine  5 mg Oral Daily  . [DISCONTINUED] insulin aspart  0-15 Units Subcutaneous TID WC & HS  . 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 SCr stable.  Sputum culture grew normal flora.  Plan:   No change to current cefepime dosage for now.  Await word on discharge plans.  Elie Goody, PharmD, BCPS Pager: [phone] 02/05/2012  1:29 PM

## 2012-02-07 LAB — CULTURE, BLOOD (ROUTINE X 2): Culture: NO GROWTH

## 2012-07-19 ENCOUNTER — Other Ambulatory Visit (HOSPITAL_COMMUNITY): Payer: Self-pay | Admitting: Internal Medicine

## 2012-07-19 DIAGNOSIS — Z1231 Encounter for screening mammogram for malignant neoplasm of breast: Secondary | ICD-10-CM

## 2012-07-20 ENCOUNTER — Emergency Department (INDEPENDENT_AMBULATORY_CARE_PROVIDER_SITE_OTHER)
Admission: EM | Admit: 2012-07-20 | Discharge: 2012-07-20 | Disposition: A | Discharge status: Home / Self Care | Payer: Medicare Other | Source: Home / Self Care | Attending: Family Medicine | Admitting: Family Medicine

## 2012-07-20 ENCOUNTER — Encounter (HOSPITAL_COMMUNITY): Payer: Self-pay | Admitting: *Deleted

## 2012-07-20 DIAGNOSIS — N760 Acute vaginitis: Secondary | ICD-10-CM

## 2012-07-20 LAB — POCT URINALYSIS DIP (DEVICE)
Glucose, UA: NEGATIVE mg/dL
Ketones, ur: NEGATIVE mg/dL
Leukocytes, UA: NEGATIVE
Protein, ur: NEGATIVE mg/dL
Urobilinogen, UA: 0.2 mg/dL (ref 0.0–1.0)

## 2012-07-20 LAB — GLUCOSE, CAPILLARY: Glucose-Capillary: 137 mg/dL — ABNORMAL HIGH (ref 70–99)

## 2012-07-20 MED ORDER — FLUCONAZOLE 150 MG PO TABS: ORAL_TABLET | ORAL | Status: DC

## 2012-07-20 MED ORDER — NYSTATIN-TRIAMCINOLONE 100000-0.1 UNIT/GM-% EX OINT: TOPICAL_OINTMENT | Freq: Two times a day (BID) | CUTANEOUS | Status: DC

## 2012-07-20 NOTE — ED Notes (Signed)
Pt  Reports  Symptoms  Of      Vaginal  Irritation  With  Burning  And  Itch    Especially  After  Urinating    Pt  Is  A  Diabetic  She  staes  Her  Sugars  Have  Been   Normal      When  Checked  At home

## 2012-07-20 NOTE — ED Provider Notes (Signed)
History    CSN: [id number] Arrival date & time 07/20/12  4098  First MD Initiated Contact with Patient 07/20/12 1004     Chief Complaint  Patient presents with  . Vaginal Itching   (Consider location/radiation/quality/duration/timing/severity/associated sxs/prior Treatment) HPI Comments: 57 year old obese female with history of diabetes. Here complaining of vaginal and vulvar itchiness and irritation for one week. State there is sometimes burning after urination but denies burning during urination. Denies fever or chills. No nausea vomiting or diarrhea. Her fasting CBGs 137 he appears only taking metformin for diabetes. State her last doctor's appointment was 2 weeks ago. She is unsure about hold her diabetes is. Reports compliance with her medications. Denies vaginal discharge or pelvic pain. Patient is not sexually active.   Past Medical History  Diagnosis Date  . Diabetes mellitus   . Depression   . Hypertension   . Cerebral aneurysm     x2   Past Surgical History  Procedure Laterality Date  . Brain surgery      double aneurysm   History reviewed. No pertinent family history. History  Substance Use Topics  . Smoking status: Current Some Day Smoker  . Smokeless tobacco: Never Used  . Alcohol Use: No   OB History   Grav Para Term Preterm Abortions TAB SAB Ect Mult Living                 Review of Systems  Constitutional: Negative for fever, chills, activity change, appetite change and fatigue.  HENT: Negative for sinus pressure.   Eyes: Negative for discharge.  Respiratory: Negative for shortness of breath.   Cardiovascular: Negative for chest pain and leg swelling.  Gastrointestinal: Negative for nausea, vomiting, abdominal pain and diarrhea.  Endocrine: Negative for polydipsia, polyphagia and polyuria.  Genitourinary: Positive for genital sores and vaginal pain. Negative for frequency, hematuria, flank pain, vaginal bleeding, vaginal discharge and pelvic pain.   Skin: Positive for rash. Negative for wound.  Neurological: Negative for dizziness and headaches.  All other systems reviewed and are negative.    Allergies  Review of patient's allergies indicates no known allergies.  Home Medications   Current Outpatient Rx  Name  Route  Sig  Dispense  Refill  . amLODipine (NORVASC) 5 MG tablet   Oral   Take 1 tablet (5 mg total) by mouth daily.   30 tablet   1   . fluconazole (DIFLUCAN) 150 MG tablet      1 tab po Q72 h x3   3 tablet   0   . labetalol (NORMODYNE) 200 MG tablet   Oral   Take 200 mg by mouth 2 (two) times daily.           Marland Kitchen lisinopril (PRINIVIL,ZESTRIL) 40 MG tablet   Oral   Take 40 mg by mouth every morning.         . metFORMIN (GLUCOPHAGE) 850 MG tablet   Oral   Take 1,700 mg by mouth daily with breakfast.          . nystatin-triamcinolone ointment (MYCOLOG)   Topical   Apply topically 2 (two) times daily.   30 g   0    BP 130/80  Pulse 72  Temp(Src) 98.6 F (37 C) (Oral)  Resp 18  SpO2 100% Physical Exam  Nursing note and vitals reviewed. Constitutional: No distress.  Morbid obesity.   HENT:  Head: Normocephalic and atraumatic.  Mouth/Throat: Oropharynx is clear and moist.  Eyes: Conjunctivae are normal. Right  eye exhibits no discharge. Left eye exhibits no discharge. No scleral icterus.  Neck: No JVD present. No thyromegaly present.  Cardiovascular: Normal rate, regular rhythm and normal heart sounds.   Pulmonary/Chest: Breath sounds normal.  Abdominal: Soft. There is no tenderness.  No CVT  Genitourinary:  Generalized Vulvovaginal erythema with excoriations from scratching.   Lymphadenopathy:    She has no cervical adenopathy.  Skin: Rash noted. She is not diaphoretic.  Vulvar rash as per Genital exam. There is also hyperpigmentation of the skin with no brakes or maceration in bilateral groin areas.     ED Course  Procedures (including critical care time) Labs Reviewed  GLUCOSE,  CAPILLARY - Abnormal; Notable for the following:    Glucose-Capillary 137 (*)    All other components within normal limits  POCT URINALYSIS DIP (DEVICE)   No results found. 1. Vaginitis and vulvovaginitis     MDM  Treated with nystatin/stretching along ointment and fluconazole pills. Asked to followup with her primary care provider to monitor her diabetes. Supportive care and red flags that should prompt her return to medical attention discussed with patient and provided in writing.  Sharin Grave, MD 07/21/12 [phone]

## 2012-10-06 ENCOUNTER — Emergency Department (INDEPENDENT_AMBULATORY_CARE_PROVIDER_SITE_OTHER): Payer: Medicare Other

## 2012-10-06 ENCOUNTER — Emergency Department (INDEPENDENT_AMBULATORY_CARE_PROVIDER_SITE_OTHER)
Admission: EM | Admit: 2012-10-06 | Discharge: 2012-10-06 | Disposition: A | Discharge status: Home / Self Care | Payer: Medicare Other | Source: Home / Self Care | Attending: Family Medicine | Admitting: Family Medicine

## 2012-10-06 ENCOUNTER — Encounter (HOSPITAL_COMMUNITY): Payer: Self-pay | Admitting: *Deleted

## 2012-10-06 DIAGNOSIS — N760 Acute vaginitis: Secondary | ICD-10-CM

## 2012-10-06 DIAGNOSIS — M25539 Pain in unspecified wrist: Secondary | ICD-10-CM

## 2012-10-06 DIAGNOSIS — M25531 Pain in right wrist: Secondary | ICD-10-CM

## 2012-10-06 MED ORDER — PREDNISONE 10 MG PO KIT: PACK | ORAL | Status: DC

## 2012-10-06 MED ORDER — KETOROLAC TROMETHAMINE 60 MG/2ML IM SOLN
60.0000 mg | Freq: Once | INTRAMUSCULAR | Status: AC
  Administered 2012-10-06: 60 mg via INTRAMUSCULAR

## 2012-10-06 MED ORDER — KETOROLAC TROMETHAMINE 60 MG/2ML IM SOLN
INTRAMUSCULAR | Status: AC
  Filled 2012-10-06: qty 2

## 2012-10-06 NOTE — ED Notes (Signed)
Pt  reports  Pain r  Arm      Working  Downward       X  2  Nights         denys  Any  specefic  Injury       -  No  obvioud  Deformity noted   Sitting  Upright ion exam  Table  Speaking in  Complete  sentances

## 2012-10-06 NOTE — ED Provider Notes (Signed)
Linda Chase is a 57 y.o. female who presents to Urgent Care today for right wrist pain. Patient has dorsal and radial right wrist pain starting Wednesday without injury. She notes the pain is worse with activity and wrist motion. She denies any radiating pain or numbness. She denies any recent overuse or falls or injury. She has tried ibuprofen which has not helped much.    Past Medical History  Diagnosis Date  . Diabetes mellitus   . Depression   . Hypertension   . Cerebral aneurysm     x2   History  Substance Use Topics  . Smoking status: Current Some Day Smoker  . Smokeless tobacco: Never Used  . Alcohol Use: No   ROS as above Medications reviewed. No current facility-administered medications for this encounter.   Current Outpatient Prescriptions  Medication Sig Dispense Refill  . amLODipine (NORVASC) 5 MG tablet Take 1 tablet (5 mg total) by mouth daily.  30 tablet  1  . fluconazole (DIFLUCAN) 150 MG tablet 1 tab po Q72 h x3  3 tablet  0  . labetalol (NORMODYNE) 200 MG tablet Take 200 mg by mouth 2 (two) times daily.        Marland Kitchen lisinopril (PRINIVIL,ZESTRIL) 40 MG tablet Take 40 mg by mouth every morning.      . metFORMIN (GLUCOPHAGE) 850 MG tablet Take 1,000 mg by mouth daily with breakfast.       . nystatin-triamcinolone ointment (MYCOLOG) Apply topically 2 (two) times daily.  30 g  0  . PredniSONE 10 MG KIT 12 day dose pack po  1 kit  0    Exam:  BP 148/88  Pulse 72  Temp(Src) 98.6 F (37 C) (Oral)  Resp 18  SpO2 100% Gen: Well NAD, morbidly obese  right wrist: Swollen radial and dorsal.  Tender palpation radial dorsal Pain with wrist flexion and ulnar deviation Positive Finkelstein's test Capillary refill sensation intact distally Motion is intact in the hand.   Limited musculoskeletal ultrasound of the dorsal wrist:  Fluid surrounding the intersection of the first and second compartments.  Fluid surrounding the tendons of the fourth compartment.  Dorsal wrist  is otherwise normal appearing  No results found for this or any previous visit (from the past 24 hour(s)). Dg Wrist Complete Right  10/06/2012   CLINICAL DATA:  Pain  EXAM: RIGHT WRIST - COMPLETE 3+ VIEW  COMPARISON:  None.  FINDINGS: Frontal, oblique, lateral, and ulnar deviation scaphoid images were obtained. There is no fracture or dislocation. Joint spaces appear intact. No erosive change.  IMPRESSION: No abnormality noted.   Electronically Signed   By: Bretta Bang   On: 10/06/2012 11:13    Assessment and Plan: 57 y.o. 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  Plan to use prednisone Dosepak and thumb spica splint.  Followup at sports medicine if not improving Discussed warning signs or symptoms. Please see discharge instructions. Patient expresses understanding.      Rodolph Bong, MD 10/06/12 1154

## 2012-11-01 ENCOUNTER — Other Ambulatory Visit (HOSPITAL_COMMUNITY): Payer: Self-pay | Admitting: Internal Medicine

## 2012-11-01 DIAGNOSIS — Z1231 Encounter for screening mammogram for malignant neoplasm of breast: Secondary | ICD-10-CM

## 2012-11-16 ENCOUNTER — Ambulatory Visit (HOSPITAL_COMMUNITY)
Admission: RE | Admit: 2012-11-16 | Discharge: 2012-11-16 | Disposition: A | Discharge status: Home / Self Care | Payer: Medicare Other | Source: Ambulatory Visit | Attending: Internal Medicine | Admitting: Internal Medicine

## 2012-11-16 DIAGNOSIS — Z1231 Encounter for screening mammogram for malignant neoplasm of breast: Secondary | ICD-10-CM | POA: Insufficient documentation

## 2013-04-06 ENCOUNTER — Telehealth (HOSPITAL_COMMUNITY): Payer: Self-pay

## 2013-04-06 ENCOUNTER — Encounter (HOSPITAL_COMMUNITY): Payer: Self-pay | Admitting: Emergency Medicine

## 2013-04-06 ENCOUNTER — Emergency Department (INDEPENDENT_AMBULATORY_CARE_PROVIDER_SITE_OTHER)
Admission: EM | Admit: 2013-04-06 | Discharge: 2013-04-06 | Disposition: A | Discharge status: Nursing Home (Includes ICF) | Payer: Medicare Other | Source: Home / Self Care

## 2013-04-06 ENCOUNTER — Emergency Department (HOSPITAL_COMMUNITY)
Admission: EM | Admit: 2013-04-06 | Discharge: 2013-04-06 | Disposition: A | Discharge status: Home / Self Care | Payer: Medicare Other | Attending: Emergency Medicine | Admitting: Emergency Medicine

## 2013-04-06 ENCOUNTER — Emergency Department (HOSPITAL_COMMUNITY): Payer: Medicare Other

## 2013-04-06 DIAGNOSIS — E119 Type 2 diabetes mellitus without complications: Secondary | ICD-10-CM | POA: Insufficient documentation

## 2013-04-06 DIAGNOSIS — J3489 Other specified disorders of nose and nasal sinuses: Secondary | ICD-10-CM | POA: Insufficient documentation

## 2013-04-06 DIAGNOSIS — Z792 Long term (current) use of antibiotics: Secondary | ICD-10-CM | POA: Insufficient documentation

## 2013-04-06 DIAGNOSIS — K1370 Unspecified lesions of oral mucosa: Secondary | ICD-10-CM

## 2013-04-06 DIAGNOSIS — Z9889 Other specified postprocedural states: Secondary | ICD-10-CM | POA: Insufficient documentation

## 2013-04-06 DIAGNOSIS — R0602 Shortness of breath: Secondary | ICD-10-CM | POA: Insufficient documentation

## 2013-04-06 DIAGNOSIS — J029 Acute pharyngitis, unspecified: Secondary | ICD-10-CM | POA: Insufficient documentation

## 2013-04-06 DIAGNOSIS — Z79899 Other long term (current) drug therapy: Secondary | ICD-10-CM | POA: Insufficient documentation

## 2013-04-06 DIAGNOSIS — F3289 Other specified depressive episodes: Secondary | ICD-10-CM | POA: Insufficient documentation

## 2013-04-06 DIAGNOSIS — Z8669 Personal history of other diseases of the nervous system and sense organs: Secondary | ICD-10-CM | POA: Insufficient documentation

## 2013-04-06 DIAGNOSIS — I10 Essential (primary) hypertension: Secondary | ICD-10-CM | POA: Insufficient documentation

## 2013-04-06 DIAGNOSIS — Z880 Allergy status to penicillin: Secondary | ICD-10-CM | POA: Insufficient documentation

## 2013-04-06 DIAGNOSIS — K1379 Other lesions of oral mucosa: Secondary | ICD-10-CM

## 2013-04-06 DIAGNOSIS — J988 Other specified respiratory disorders: Secondary | ICD-10-CM

## 2013-04-06 DIAGNOSIS — K122 Cellulitis and abscess of mouth: Secondary | ICD-10-CM | POA: Insufficient documentation

## 2013-04-06 DIAGNOSIS — IMO0002 Reserved for concepts with insufficient information to code with codable children: Secondary | ICD-10-CM | POA: Insufficient documentation

## 2013-04-06 DIAGNOSIS — F329 Major depressive disorder, single episode, unspecified: Secondary | ICD-10-CM | POA: Insufficient documentation

## 2013-04-06 DIAGNOSIS — F17200 Nicotine dependence, unspecified, uncomplicated: Secondary | ICD-10-CM | POA: Insufficient documentation

## 2013-04-06 LAB — CBC WITH DIFFERENTIAL/PLATELET
Basophils Absolute: 0 10*3/uL (ref 0.0–0.1)
Basophils Relative: 0 % (ref 0–1)
EOS ABS: 0.2 10*3/uL (ref 0.0–0.7)
EOS PCT: 2 % (ref 0–5)
HEMATOCRIT: 38.6 % (ref 36.0–46.0)
HEMOGLOBIN: 12.6 g/dL (ref 12.0–15.0)
LYMPHS ABS: 2.1 10*3/uL (ref 0.7–4.0)
LYMPHS PCT: 25 % (ref 12–46)
MCH: 27.6 pg (ref 26.0–34.0)
MCHC: 32.6 g/dL (ref 30.0–36.0)
MCV: 84.6 fL (ref 78.0–100.0)
MONO ABS: 0.4 10*3/uL (ref 0.1–1.0)
MONOS PCT: 5 % (ref 3–12)
Neutro Abs: 5.6 10*3/uL (ref 1.7–7.7)
Neutrophils Relative %: 67 % (ref 43–77)
Platelets: 278 10*3/uL (ref 150–400)
RBC: 4.56 MIL/uL (ref 3.87–5.11)
RDW: 14 % (ref 11.5–15.5)
WBC: 8.4 10*3/uL (ref 4.0–10.5)

## 2013-04-06 LAB — BASIC METABOLIC PANEL
BUN: 12 mg/dL (ref 6–23)
CALCIUM: 9.5 mg/dL (ref 8.4–10.5)
CHLORIDE: 104 meq/L (ref 96–112)
CO2: 28 meq/L (ref 19–32)
Creatinine, Ser: 0.79 mg/dL (ref 0.50–1.10)
GFR calc Af Amer: >90 mL/min (ref >90)
GFR calc non Af Amer: >90 mL/min (ref >90)
GLUCOSE: 121 mg/dL — AB (ref 70–99)
POTASSIUM: 4.4 meq/L (ref 3.7–5.3)
SODIUM: 143 meq/L (ref 137–147)

## 2013-04-06 LAB — RAPID STREP SCREEN (MED CTR MEBANE ONLY): Streptococcus, Group A Screen (Direct): NEGATIVE

## 2013-04-06 MED ORDER — IPRATROPIUM-ALBUTEROL 0.5-2.5 (3) MG/3ML IN SOLN
3.0000 mL | Freq: Once | RESPIRATORY_TRACT | Status: AC
  Administered 2013-04-06: 3 mL via RESPIRATORY_TRACT
  Filled 2013-04-06: qty 3

## 2013-04-06 MED ORDER — SODIUM CHLORIDE 0.9 % IV BOLUS (SEPSIS)
1000.0000 mL | Freq: Once | INTRAVENOUS | Status: AC
  Administered 2013-04-06: 1000 mL via INTRAVENOUS

## 2013-04-06 MED ORDER — HYDROCODONE-ACETAMINOPHEN 7.5-325 MG/15ML PO SOLN: 10.0000 mL | Freq: Three times a day (TID) | ORAL | Status: DC | PRN

## 2013-04-06 MED ORDER — DIPHENHYDRAMINE HCL 50 MG/ML IJ SOLN
50.0000 mg | Freq: Once | INTRAMUSCULAR | Status: AC
  Administered 2013-04-06: 50 mg via INTRAMUSCULAR

## 2013-04-06 MED ORDER — METHYLPREDNISOLONE SODIUM SUCC 125 MG IJ SOLR
80.0000 mg | Freq: Once | INTRAMUSCULAR | Status: AC
  Administered 2013-04-06: 80 mg via INTRAMUSCULAR

## 2013-04-06 MED ORDER — DEXTROSE 5 % IV SOLN
1.0000 g | Freq: Once | INTRAVENOUS | Status: AC
  Administered 2013-04-06: 1 g via INTRAVENOUS
  Filled 2013-04-06: qty 10

## 2013-04-06 MED ORDER — HYDROCODONE-ACETAMINOPHEN 7.5-325 MG/15ML PO SOLN
10.0000 mL | Freq: Once | ORAL | Status: AC
  Administered 2013-04-06: 10 mL via ORAL
  Filled 2013-04-06: qty 15

## 2013-04-06 MED ORDER — PREDNISONE 20 MG PO TABS: 40.0000 mg | ORAL_TABLET | Freq: Every day | ORAL | Status: DC

## 2013-04-06 MED ORDER — METHYLPREDNISOLONE SODIUM SUCC 125 MG IJ SOLR
INTRAMUSCULAR | Status: AC
  Filled 2013-04-06: qty 2

## 2013-04-06 MED ORDER — CEFUROXIME AXETIL 500 MG PO TABS: 500.0000 mg | ORAL_TABLET | Freq: Two times a day (BID) | ORAL | Status: DC

## 2013-04-06 MED ORDER — DIPHENHYDRAMINE HCL 50 MG/ML IJ SOLN
INTRAMUSCULAR | Status: AC
  Filled 2013-04-06: qty 1

## 2013-04-06 NOTE — ED Notes (Signed)
Grey PA at bedside.

## 2013-04-06 NOTE — Discharge Instructions (Signed)
Take medications as directed. Make follow up appointment with your doctor in 2 days for further reevaluation. Please return to Emergency Department if you develop any worsening symptoms, worsening swelling, inability to swallow, or difficulty breathing.     Uvulitis Uvulitis is redness and soreness (inflammation) of the uvula. The uvula is the small tongue-shaped piece of tissue in the back of your mouth.  CAUSES Infection is a common cause of uvulitis. Infection of the uvula can be either viral or bacterial. Infectious uvulitis usually only occurs in association with another condition, such as inflammation and infection of the mouth or throat. Other causes of uvulitis include:  Trauma to the uvula.  Swelling from excess fluid buildup (edema), which may be an allergic reaction.  Inhalation of irritants, such as chemical agents, smoke, or steam. DIAGNOSIS Your caregiver can usually diagnose uvulitis through a physical examination. Bacterial uvulitis can be diagnosed through the results of the growth of samples of bodily substances taken from your mouth (cultures). HOME CARE INSTRUCTIONS   Rest as much as possible.  Young children may suck on frozen juice bars or frozen ice pops. Older children and adults may gargle with a warm or cold liquid to help soothe the throat. (Mix  tsp of salt in 8 oz of water, or use strong tea.)  Use a cool-mist humidifier to lessen throat irritation and cough.  Drink enough fluids to keep your urine clear or pale yellow.  While the throat is very sore, eat soft or liquid foods such as milk, ice cream, soups, or milk drinks.  Family members who develop a sore throat or fever should have a medical exam or throat culture.  If your child has uvulitis and is taking antibiotic medicine, wait 24 hours or until his or her temperature is near normal (less than 100 F [37.8 C]) before allowing him or her to return to school or day care.  Only take  over-the-counter or prescription medicines for pain, discomfort, or fever as directed by your caregiver. Ask when your test results will be ready. Make sure you get your test results. SEEK MEDICAL CARE IF:   You have an oral temperature above 102 F (38.9 C).  You develop large, tender lumps your the neck.  Your child develops a rash.  You cough up green, yellow-brown, or bloody substances. SEEK IMMEDIATE MEDICAL CARE IF:   You develop any new symptoms, such as vomiting, earache, severe headache, stiff neck, chest pain, or trouble breathing or swallowing.  Your airway is blocked.  You develop more severe throat pain along with drooling or voice changes. Document Released: 08/08/2003 Document Revised: 03/22/2011 Document Reviewed: 03/05/2010 [facility] Patient Information 2014 [employer], [place].

## 2013-04-06 NOTE — ED Notes (Addendum)
[facility] Reports this AM felt "something in my throat" and sore throat. Uvula noted to be swollen, airway intact but narrow. Pt able to swallow; denies SOB. [facility] 80 mg and Benadryl 50 IM. Pt denies any relief. AO x4.

## 2013-04-06 NOTE — ED Notes (Signed)
Pt calling to find out if she should take her Metformin.  Dr Fayrene FearingJames consulted "Dont take Metformin for 48 hours because pt had CT w/contrast"  Pt informed.

## 2013-04-06 NOTE — ED Notes (Addendum)
Pt  Reports  Symptoms  Of     Something  In her  Throat     With    Difficulty  Swallowing          Shortness  Of  Breath  And  Gurgling  Sensation in her throat      She  denys any  No  FB      She  Reports  Has  Had  Nasal  Congestion  With  Drainage           And  Runny  Nose             She  Ambulated  To  Room  With          A  Slow  Steady    Gait           Pt  Has  Some  swelling of the  Uvula      Pt tooks  Some  Benadryl earlier   this  Am

## 2013-04-06 NOTE — ED Notes (Signed)
Patient transported to X-ray 

## 2013-04-06 NOTE — ED Provider Notes (Signed)
CSN: [id number]     Arrival date & time 04/06/13  6389 History   First MD Initiated Contact with Patient 04/06/13 [phone]     Chief Complaint  Patient presents with  . Dysphagia   (Consider location/radiation/quality/duration/timing/severity/associated sxs/prior Treatment) HPI Comments: 58 year old morbidly obese female at type 2 diabetes mellitus awoke this morning with a sensation of a foreign body in her throat. She is having trouble swallowing and a feeling of her airway closing off. There is pain with swallowing and some difficulty in initiating swallowing. She has been taking ibuprofen and last night took Benadryl for these are her usual nighttime medications. Other medicine which may be contributory are lisinopril for which she has taken for-2 years. She is able to swallow liquids and was able to drink a fourth of a couple of water and the urgent care.   Past Medical History  Diagnosis Date  . Diabetes mellitus   . Depression   . Hypertension   . Cerebral aneurysm     x2   Past Surgical History  Procedure Laterality Date  . Brain surgery      double aneurysm   History reviewed. No pertinent family history. History  Substance Use Topics  . Smoking status: Current Some Day Smoker  . Smokeless tobacco: Never Used  . Alcohol Use: No   OB History   Grav Para Term Preterm Abortions TAB SAB Ect Mult Living                 Review of Systems  Constitutional: Positive for activity change and appetite change. Negative for fever and fatigue.  HENT: Positive for postnasal drip, sore throat and trouble swallowing. Negative for dental problem, ear discharge, ear pain, facial swelling and mouth sores.   Respiratory: Negative for cough, shortness of breath and wheezing.   Cardiovascular: Negative.   Gastrointestinal: Negative.   Neurological: Negative.     Allergies  Penicillins  Home Medications   Current Outpatient Rx  Name  Route  Sig  Dispense  Refill  . amLODipine  (NORVASC) 5 MG tablet   Oral   Take 1 tablet (5 mg total) by mouth daily.   30 tablet   1   . fluconazole (DIFLUCAN) 150 MG tablet      1 tab po Q72 h x3   3 tablet   0   . labetalol (NORMODYNE) 200 MG tablet   Oral   Take 200 mg by mouth 2 (two) times daily.           Marland Kitchen lisinopril (PRINIVIL,ZESTRIL) 40 MG tablet   Oral   Take 40 mg by mouth every morning.         . metFORMIN (GLUCOPHAGE) 850 MG tablet   Oral   Take 1,000 mg by mouth daily with breakfast.          . nystatin-triamcinolone ointment (MYCOLOG)   Topical   Apply topically 2 (two) times daily.   30 g   0   . PredniSONE 10 MG KIT      12 day dose pack po   1 kit   0    BP 134/89  Pulse 92  Temp(Src) 99.5 F (37.5 C) (Oral)  Resp 20  SpO2 95% Physical Exam  Constitutional: She is oriented to person, place, and time. She appears well-developed and well-nourished. No distress.  HENT:  There are abnormal upper respiratory sounds with inspiration and expiration that are transmitted into the airways. Normal voice, no hoarsness.  The  uvula is swollen and enlarged. The tongue does not appear to be swollen. There is redundant oral tissue probably secondary to patient's body habitus/obesity. The airway is narrowed but patent.    Eyes: Conjunctivae are normal.  Neck: Normal range of motion. Neck supple.  Pulmonary/Chest: She has no wheezes. She has no rales.  There are abnormal upper respiratory sounds with inspiration and expiration that are transmitted into the airways.  The uvula is swollen and enlarged. The tongue does not appear to be swollen. There is redundant oral tissue probably secondary to patient's body habitus/obesity. The airway is narrowed but patent.   Musculoskeletal: She exhibits no edema.  Neurological: She is alert and oriented to person, place, and time. She exhibits normal muscle tone.  Skin: Skin is warm and dry.  No rash, urticarial lesions or itching.  Psychiatric: She has a  normal mood and affect.    ED Course  Procedures (including critical care time) Labs Review Labs Reviewed - No data to display Imaging Review No results found.   MDM   1. Uvular swelling   2. Partial obstruction of airway      Transfer to [facility] ED for partial upper airway obstruction and swelling of the uvula. Exacerbated by pt's body habitus and redundant intraoral tissue. Stable with patent airway and no distress. Solumedrol 80 mg and Benadryl 50 IM prior to transfer    Janne Napoleon, NP 04/06/13 1014

## 2013-04-06 NOTE — ED Provider Notes (Signed)
CSN: [id number]     Arrival date & time 04/06/13  1045 History   First MD Initiated Contact with Patient 04/06/13 1114     Chief Complaint  Patient presents with  . Oral Swelling     (Consider location/radiation/quality/duration/timing/severity/associated sxs/prior Treatment) HPI 58 yo obese female with hx of DM and HTN treated with lisinopril x 2 yrs presents with oral swelling that started this morning when she awoke. Patient seen at urgent care earlier, given IM benadryl and Solumedrol and sent to ED for further evaluation. Patient Complains of foreign body sensation in throat, dysphagia, sore throat, and painful swallowing starting this morning. Never had these sxs before. Admits to runny nose, congestion, and occasional SOB (ongoing x "years"). Admits to hx of heartburn controlled with tums and diet.  Past Medical History  Diagnosis Date  . Diabetes mellitus   . Depression   . Hypertension   . Cerebral aneurysm     x2   Past Surgical History  Procedure Laterality Date  . Brain surgery      double aneurysm   History reviewed. No pertinent family history. History  Substance Use Topics  . Smoking status: Current Some Day Smoker  . Smokeless tobacco: Never Used  . Alcohol Use: No   OB History   Grav Para Term Preterm Abortions TAB SAB Ect Mult Living                 Review of Systems  All other systems reviewed and are negative.      Allergies  Penicillins  Home Medications   Current Outpatient Rx  Name  Route  Sig  Dispense  Refill  . amLODipine (NORVASC) 5 MG tablet   Oral   Take 1 tablet (5 mg total) by mouth daily.   30 tablet   1   . doxycycline (VIBRA-TABS) 100 MG tablet   Oral   Take 100 mg by mouth 2 (two) times daily.         Marland Kitchen labetalol (NORMODYNE) 200 MG tablet   Oral   Take 200 mg by mouth 2 (two) times daily.           Marland Kitchen lisinopril (PRINIVIL,ZESTRIL) 40 MG tablet   Oral   Take 40 mg by mouth every morning.         . metFORMIN  (GLUCOPHAGE) 1000 MG tablet   Oral   Take 1,000 mg by mouth 2 (two) times daily with a meal.         . cefUROXime (CEFTIN) 500 MG tablet   Oral   Take 1 tablet (500 mg total) by mouth 2 (two) times daily with a meal.   20 tablet   0   . HYDROcodone-acetaminophen (HYCET) 7.5-325 mg/15 ml solution   Oral   Take 10 mLs by mouth every 8 (eight) hours as needed for moderate pain.   120 mL   0   . predniSONE (DELTASONE) 20 MG tablet   Oral   Take 2 tablets (40 mg total) by mouth daily.   10 tablet   0    BP 119/69  Pulse 73  Temp(Src) 98.6 F (37 C) (Oral)  Resp 18  SpO2 92% Physical Exam  Nursing note and vitals reviewed. Constitutional: She is oriented to person, place, and time. She appears well-developed and well-nourished. No distress.  HENT:  Head: Normocephalic and atraumatic.  Right Ear: Tympanic membrane and ear canal normal. No middle ear effusion.  Left Ear: Tympanic membrane and ear  canal normal.  No middle ear effusion.  Nose: Nose normal. Right sinus exhibits no maxillary sinus tenderness and no frontal sinus tenderness. Left sinus exhibits no maxillary sinus tenderness and no frontal sinus tenderness.  Mouth/Throat: Uvula is midline and mucous membranes are normal. No trismus in the jaw. Uvula swelling present. Posterior oropharyngeal edema and posterior oropharyngeal erythema present. No oropharyngeal exudate or tonsillar abscesses.  Eyes: Conjunctivae are normal. Right eye exhibits no discharge. Left eye exhibits no discharge. No scleral icterus.  Neck: Phonation normal. Neck supple. No JVD present. No rigidity. No tracheal deviation, no edema and no erythema present.  Cardiovascular: Normal rate and regular rhythm.  Exam reveals no gallop and no friction rub.   No murmur heard. Pulmonary/Chest: Effort normal. No stridor. No respiratory distress. She has no wheezes. She has rhonchi in the right upper field, the right middle field and the right lower field. She  has no rales.  No labored breathing or tripoding. Speaking in full sentences.    Musculoskeletal: She exhibits no edema.  Lymphadenopathy:    She has no cervical adenopathy.  Neurological: She is alert and oriented to person, place, and time.  Skin: Skin is warm and dry. She is not diaphoretic.  Psychiatric: She has a normal mood and affect. Her behavior is normal.    ED Course  Procedures (including critical care time) Labs Review Labs Reviewed  BASIC METABOLIC PANEL - Abnormal; Notable for the following:    Glucose, Bld 121 (*)    All other components within normal limits  RAPID STREP SCREEN  CULTURE, GROUP A STREP  CBC WITH DIFFERENTIAL   Imaging Review Dg Neck Soft Tissue  04/06/2013   CLINICAL DATA:  Oral swelling  EXAM: NECK SOFT TISSUES - 1+ VIEW  COMPARISON:  None.  FINDINGS: The airway is widely patent. No airway deviation. Epiglottis is normal. No prevertebral soft tissue swelling. Subglottic airway is normal.  Metal hair pin is present posteriorly on the AP view. Mild spondylosis at C5-6 and C6-7.  Intracranial aneurysm clip noted  IMPRESSION: Negative airway   Electronically Signed   By: Marlan Palauharles  Clark M.D.   On: 04/06/2013 13:07   Dg Chest 2 View  04/06/2013   CLINICAL DATA:  Oral swelling.  EXAM: CHEST  2 VIEW  COMPARISON:  02/01/2012  FINDINGS: Heart size upper normal. Vascularity normal. Lungs are clear without infiltrate effusion or mass.  IMPRESSION: No active cardiopulmonary disease.   Electronically Signed   By: Marlan Palauharles  Clark M.D.   On: 04/06/2013 13:06     EKG Interpretation None      MDM   Final diagnoses:  Uvulitis  Sore throat    Rapid strep test negative, culture sent Patient afebrile with stable VS. Patient in NAD. Patient BMP WNL CBC WNL CXR neg Neck films negative. No evidence of epligottitis Patient able to tolerate POs in ED, no evidence of airway compromise. Plan to start patient on oral prednisone burst and antbiotics for suspected  bacterial uveitis.  Discussed labs, and exam findings with patient. Advised discontinue Lisinopril and advised follow up with PCP in 2 days for further evaluation and management of BP. Recommend return to ED should symptoms worsen, or patient develops throat swelling, difficulty breathing/swallowing. Patient agrees with plan. Discharged in good condition.    Meds given in ED:  Medications  HYDROcodone-acetaminophen (HYCET) 7.5-325 mg/15 ml solution 10 mL (10 mLs Oral Given 04/06/13 1222)  ipratropium-albuterol (DUONEB) 0.5-2.5 (3) MG/3ML nebulizer solution 3 mL (3 mLs Nebulization  Given 04/06/13 1329)  cefTRIAXone (ROCEPHIN) 1 g in dextrose 5 % 50 mL IVPB (0 g Intravenous Stopped 04/06/13 1302)  sodium chloride 0.9 % bolus 1,000 mL (0 mLs Intravenous Stopped 04/06/13 1405)    New Prescriptions   CEFUROXIME (CEFTIN) 500 MG TABLET    Take 1 tablet (500 mg total) by mouth 2 (two) times daily with a meal.   HYDROCODONE-ACETAMINOPHEN (HYCET) 7.5-325 MG/15 ML SOLUTION    Take 10 mLs by mouth every 8 (eight) hours as needed for moderate pain.   PREDNISONE (DELTASONE) 20 MG TABLET    Take 2 tablets (40 mg total) by mouth daily.       Rudene Anda, PA-C 04/07/13 1650

## 2013-04-08 LAB — CULTURE, GROUP A STREP

## 2013-04-08 NOTE — ED Provider Notes (Signed)
Medical screening examination/treatment/procedure(s) were performed by a resident physician or non-physician practitioner and as the supervising physician I was immediately available for consultation/collaboration.  Dora Simeone, MD    Aislee Landgren S Jamiel Goncalves, MD 04/08/13 0844 

## 2013-04-08 NOTE — ED Provider Notes (Signed)
Medical screening examination/treatment/procedure(s) were conducted as a shared visit with non-physician practitioner(s) and myself.  I personally evaluated the patient during the encounter.   EKG Interpretation None      Pt seen and examined. Minimal uvular swelling erythema. No peritonsillar or other posterior pharyngeal swelling. Lips and tongue appear normal. Agree with treatment for uvulitis.  Pt is not dyspneic.  Able to lay supine without apprehension. Normal plain ST neck xrays. Pt will hold ACEI, although does not appear to be angioedema.      Rolland PorterMark Seamus Warehime, MD 04/08/13 [phone]

## 2014-07-03 ENCOUNTER — Ambulatory Visit (INDEPENDENT_AMBULATORY_CARE_PROVIDER_SITE_OTHER): Payer: Medicare Other | Admitting: Podiatry

## 2014-07-03 DIAGNOSIS — M79675 Pain in left toe(s): Secondary | ICD-10-CM

## 2014-07-03 DIAGNOSIS — E119 Type 2 diabetes mellitus without complications: Secondary | ICD-10-CM

## 2014-07-03 DIAGNOSIS — B351 Tinea unguium: Secondary | ICD-10-CM | POA: Diagnosis not present

## 2014-07-03 DIAGNOSIS — M79674 Pain in right toe(s): Secondary | ICD-10-CM | POA: Diagnosis not present

## 2014-07-03 DIAGNOSIS — M79676 Pain in unspecified toe(s): Secondary | ICD-10-CM

## 2014-07-03 NOTE — Progress Notes (Signed)
Patient ID: Linda Chase, female   DOB: 1955/09/24, 59 y.o.   MRN: [id number] Complaint:  Visit Type: Patient returns to my office for continued preventative foot care services. Complaint: Patient states" my nails have grown long and thick and become painful to walk and wear shoes" Patient has been diagnosed with DM with n. He presents for preventative foot care services. No changes to ROS  Podiatric Exam: Vascular: dorsalis pedis and posterior tibial pulses are palpable bilateral. Capillary return is immediate. Temperature gradient is WNL. Skin turgor WNL  Sensorium: Normal Semmes Weinstein monofilament test. Normal tactile sensation bilaterally. Nail Exam: Pt has thick disfigured discolored nails with subungual debris noted bilateral entire nail hallux through fifth toenails Ulcer Exam: There is no evidence of ulcer or pre-ulcerative changes or infection. Orthopedic Exam: Muscle tone and strength are WNL. No limitations in general ROM. No crepitus or effusions noted. Foot type and digits show no abnormalities. Bony prominences are unremarkable. Skin: No Porokeratosis. No infection or ulcers  Diagnosis:  Tinea unguium, Pain in right toe, pain in left toes  Treatment & Plan Procedures and Treatment: Consent by patient was obtained for treatment procedures. The patient understood the discussion of treatment and procedures well. All questions were answered thoroughly reviewed. Debridement of mycotic and hypertrophic toenails, 1 through 5 bilateral and clearing of subungual debris. No ulceration, no infection noted.  Return Visit-Office Procedure: Patient instructed to return to the office for a follow up visit 10 weeks  for continued evaluation and treatment.

## 2014-07-05 ENCOUNTER — Ambulatory Visit: Payer: Medicare Other | Admitting: Podiatry

## 2014-09-11 ENCOUNTER — Ambulatory Visit: Payer: Medicare Other | Admitting: Podiatry

## 2014-09-15 ENCOUNTER — Emergency Department (HOSPITAL_COMMUNITY)
Admission: EM | Admit: 2014-09-15 | Discharge: 2014-09-15 | Disposition: A | Discharge status: Home / Self Care | Payer: Medicare Other | Attending: Emergency Medicine | Admitting: Emergency Medicine

## 2014-09-15 ENCOUNTER — Encounter (HOSPITAL_COMMUNITY): Payer: Self-pay | Admitting: *Deleted

## 2014-09-15 DIAGNOSIS — Z79899 Other long term (current) drug therapy: Secondary | ICD-10-CM | POA: Diagnosis not present

## 2014-09-15 DIAGNOSIS — M542 Cervicalgia: Secondary | ICD-10-CM | POA: Diagnosis present

## 2014-09-15 DIAGNOSIS — Z7952 Long term (current) use of systemic steroids: Secondary | ICD-10-CM | POA: Insufficient documentation

## 2014-09-15 DIAGNOSIS — Z8659 Personal history of other mental and behavioral disorders: Secondary | ICD-10-CM | POA: Diagnosis not present

## 2014-09-15 DIAGNOSIS — I10 Essential (primary) hypertension: Secondary | ICD-10-CM | POA: Insufficient documentation

## 2014-09-15 DIAGNOSIS — Z720 Tobacco use: Secondary | ICD-10-CM | POA: Diagnosis not present

## 2014-09-15 DIAGNOSIS — E119 Type 2 diabetes mellitus without complications: Secondary | ICD-10-CM | POA: Diagnosis not present

## 2014-09-15 DIAGNOSIS — Z792 Long term (current) use of antibiotics: Secondary | ICD-10-CM | POA: Insufficient documentation

## 2014-09-15 MED ORDER — HYDROCODONE-ACETAMINOPHEN 5-325 MG PO TABS: 2.0000 | ORAL_TABLET | Freq: Once | ORAL | Status: DC

## 2014-09-15 MED ORDER — HYDROCODONE-ACETAMINOPHEN 5-325 MG PO TABS
1.0000 | ORAL_TABLET | Freq: Once | ORAL | Status: AC
  Administered 2014-09-15: 1 via ORAL
  Filled 2014-09-15: qty 1

## 2014-09-15 MED ORDER — METHOCARBAMOL 500 MG PO TABS
500.0000 mg | ORAL_TABLET | Freq: Two times a day (BID) | ORAL | Status: DC
Start: 2014-09-15 — End: 2015-10-09

## 2014-09-15 MED ORDER — HYDROCODONE-ACETAMINOPHEN 5-325 MG PO TABS: 2.0000 | ORAL_TABLET | ORAL | Status: DC | PRN

## 2014-09-15 MED ORDER — METHOCARBAMOL 500 MG PO TABS
500.0000 mg | ORAL_TABLET | Freq: Once | ORAL | Status: AC
  Administered 2014-09-15: 500 mg via ORAL
  Filled 2014-09-15: qty 1

## 2014-09-15 NOTE — Discharge Instructions (Signed)
Cervical Sprain Follow-up with your primary care physician in the next 2 days. Return for fever, inability to move your neck, dizziness, or severe headache. Take Tylenol or Motrin for pain and hydrocodone for breakthrough pain. A cervical sprain is an injury in the neck in which the strong, fibrous tissues (ligaments) that connect your neck bones stretch or tear. Cervical sprains can range from mild to severe. Severe cervical sprains can cause the neck vertebrae to be unstable. This can lead to damage of the spinal cord and can result in serious nervous system problems. The amount of time it takes for a cervical sprain to get better depends on the cause and extent of the injury. Most cervical sprains heal in 1 to 3 weeks. CAUSES  Severe cervical sprains may be caused by:   Contact sport injuries (such as from football, rugby, wrestling, hockey, auto racing, gymnastics, diving, martial arts, or boxing).   Motor vehicle collisions.   Whiplash injuries. This is an injury from a sudden forward and backward whipping movement of the head and neck.  Falls.  Mild cervical sprains may be caused by:   Being in an awkward position, such as while cradling a telephone between your ear and shoulder.   Sitting in a chair that does not offer proper support.   Working at a poorly Marketing executive station.   Looking up or down for long periods of time.  SYMPTOMS   Pain, soreness, stiffness, or a burning sensation in the front, back, or sides of the neck. This discomfort may develop immediately after the injury or slowly, 24 hours or more after the injury.   Pain or tenderness directly in the middle of the back of the neck.   Shoulder or upper back pain.   Limited ability to move the neck.   Headache.   Dizziness.   Weakness, numbness, or tingling in the hands or arms.   Muscle spasms.   Difficulty swallowing or chewing.   Tenderness and swelling of the neck.  DIAGNOSIS    Most of the time your health care provider can diagnose a cervical sprain by taking your history and doing a physical exam. Your health care provider will ask about previous neck injuries and any known neck problems, such as arthritis in the neck. X-rays may be taken to find out if there are any other problems, such as with the bones of the neck. Other tests, such as a CT scan or MRI, may also be needed.  TREATMENT  Treatment depends on the severity of the cervical sprain. Mild sprains can be treated with rest, keeping the neck in place (immobilization), and pain medicines. Severe cervical sprains are immediately immobilized. Further treatment is done to help with pain, muscle spasms, and other symptoms and may include:  Medicines, such as pain relievers, numbing medicines, or muscle relaxants.   Physical therapy. This may involve stretching exercises, strengthening exercises, and posture training. Exercises and improved posture can help stabilize the neck, strengthen muscles, and help stop symptoms from returning.  HOME CARE INSTRUCTIONS   Put ice on the injured area.   Put ice in a plastic bag.   Place a towel between your skin and the bag.   Leave the ice on for 15-20 minutes, 3-4 times a day.   If your injury was severe, you may have been given a cervical collar to wear. A cervical collar is a two-piece collar designed to keep your neck from moving while it heals.  Do not remove  the collar unless instructed by your health care provider.  If you have long hair, keep it outside of the collar.  Ask your health care provider before making any adjustments to your collar. Minor adjustments may be required over time to improve comfort and reduce pressure on your chin or on the back of your head.  Ifyou are allowed to remove the collar for cleaning or bathing, follow your health care provider's instructions on how to do so safely.  Keep your collar clean by wiping it with mild soap  and water and drying it completely. If the collar you have been given includes removable pads, remove them every 1-2 days and hand wash them with soap and water. Allow them to air dry. They should be completely dry before you wear them in the collar.  If you are allowed to remove the collar for cleaning and bathing, wash and dry the skin of your neck. Check your skin for irritation or sores. If you see any, tell your health care provider.  Do not drive while wearing the collar.   Only take over-the-counter or prescription medicines for pain, discomfort, or fever as directed by your health care provider.   Keep all follow-up appointments as directed by your health care provider.   Keep all physical therapy appointments as directed by your health care provider.   Make any needed adjustments to your workstation to promote good posture.   Avoid positions and activities that make your symptoms worse.   Warm up and stretch before being active to help prevent problems.  SEEK MEDICAL CARE IF:   Your pain is not controlled with medicine.   You are unable to decrease your pain medicine over time as planned.   Your activity level is not improving as expected.  SEEK IMMEDIATE MEDICAL CARE IF:   You develop any bleeding.  You develop stomach upset.  You have signs of an allergic reaction to your medicine.   Your symptoms get worse.   You develop new, unexplained symptoms.   You have numbness, tingling, weakness, or paralysis in any part of your body.  MAKE SURE YOU:   Understand these instructions.  Will watch your condition.  Will get help right away if you are not doing well or get worse. Document Released: 10/25/2006 Document Revised: 01/02/2013 Document Reviewed: 07/05/2012 [facility] Patient Information 2015 [place], [place]. This information is not intended to replace advice given to you by your health care provider. Make sure you discuss any questions you have with  your health care provider.

## 2014-09-15 NOTE — ED Provider Notes (Signed)
CSN: [id number]     Arrival date & time 09/15/14  1702 History   First MD Initiated Contact with Patient 09/15/14 1748     Chief Complaint  Patient presents with  . Neck Pain     (Consider location/radiation/quality/duration/timing/severity/associated sxs/prior Treatment) Patient is a 59 y.o. female presenting with neck pain. The history is provided by the patient. No language interpreter was used.  Neck Pain Associated symptoms: no chest pain, no fever, no numbness and no weakness    Miss Resnick is a 59 year old female with a history of diabetes, depression, hypertension, neuropathy, and cerebral aneurysm who presents for neck pain that began yesterday and is worse with movement. She states she took ibuprofen twice with minimal relief. She also states that she slept without pillows the night before while tossing and turning. She thinks it may have been exacerbated by being out in the wind. She denies any fever, chills, recent illness, slurred speech, facial droop, chest pain, dizziness, shortness of breath, nausea, vomiting, lower or upper extremity weakness. She denies any injury or fall.  Past Medical History  Diagnosis Date  . Diabetes mellitus   . Depression   . Hypertension   . Cerebral aneurysm     x2   Past Surgical History  Procedure Laterality Date  . Brain surgery      double aneurysm   No family history on file. Social History  Substance Use Topics  . Smoking status: Current Some Day Smoker    Types: Cigarettes  . Smokeless tobacco: Never Used  . Alcohol Use: No   OB History    No data available     Review of Systems  Constitutional: Negative for fever and chills.  Cardiovascular: Negative for chest pain and palpitations.  Musculoskeletal: Positive for neck pain.  Neurological: Negative for dizziness, syncope, facial asymmetry, speech difficulty, weakness and numbness.  All other systems reviewed and are negative.     Allergies  Review of patient's  allergies indicates no active allergies.  Home Medications   Prior to Admission medications   Medication Sig Start Date End Date Taking? Authorizing Provider  amLODipine (NORVASC) 5 MG tablet Take 1 tablet (5 mg total) by mouth daily. 02/05/12   Catarina Hartshorn, MD  cefUROXime (CEFTIN) 500 MG tablet Take 1 tablet (500 mg total) by mouth 2 (two) times daily with a meal. 04/06/13   Cristobal Goldmann, PA-C  doxycycline (VIBRA-TABS) 100 MG tablet Take 100 mg by mouth 2 (two) times daily. 03/21/13   Historical Provider, MD  HYDROcodone-acetaminophen (NORCO/VICODIN) 5-325 MG per tablet Take 2 tablets by mouth every 4 (four) hours as needed. 09/15/14   Darchelle Nunes Patel-Mills, PA-C  labetalol (NORMODYNE) 200 MG tablet Take 200 mg by mouth 2 (two) times daily.      Historical Provider, MD  lisinopril (PRINIVIL,ZESTRIL) 40 MG tablet Take 40 mg by mouth every morning.    Historical Provider, MD  metFORMIN (GLUCOPHAGE) 1000 MG tablet Take 1,000 mg by mouth 2 (two) times daily with a meal.    Historical Provider, MD  methocarbamol (ROBAXIN) 500 MG tablet Take 1 tablet (500 mg total) by mouth 2 (two) times daily. 09/15/14   Dawanda Mapel Patel-Mills, PA-C  predniSONE (DELTASONE) 20 MG tablet Take 2 tablets (40 mg total) by mouth daily. 04/06/13   Cristobal Goldmann, PA-C   BP 147/85 mmHg  Pulse 79  Temp(Src) 98 F (36.7 C) (Oral)  Resp 18  Ht 5\' 5"  (1.651 m)  Wt 279 lb (126.554 kg)  BMI 46.43  kg/m2  SpO2 98% Physical Exam  Constitutional: She is oriented to person, place, and time. She appears well-developed and well-nourished.  HENT:  Head: Normocephalic and atraumatic.  Eyes: Conjunctivae are normal.  Neck: Normal range of motion. Neck supple. Muscular tenderness present. No spinous process tenderness present. No rigidity. Normal range of motion present. No Kernig's sign noted.    No carotid bruit. Normal ROM of the neck. Able to touch chin to chest without difficulty or pain. Painful rotation of the neck. No midline cervical  tenderness.  Cardiovascular: Normal rate, regular rhythm and normal heart sounds.   Pulmonary/Chest: Effort normal and breath sounds normal.  Abdominal: Soft. There is no tenderness.  Musculoskeletal: Normal range of motion.  Neurological: She is alert and oriented to person, place, and time. She has normal strength. No sensory deficit. She displays a negative Romberg sign. Coordination normal. GCS eye subscore is 4. GCS verbal subscore is 5. GCS motor subscore is 6.  Cranial nerves III through XII intact. No weakness in the upper or lower extremities. Ambulatory with steady gait.  Skin: Skin is warm and dry.  Psychiatric: She has a normal mood and affect. Her behavior is normal.  Nursing note and vitals reviewed.   ED Course  Procedures (including critical care time) Labs Review Labs Reviewed - No data to display  Imaging Review No results found.  EKG Interpretation None      MDM   Final diagnoses:  Musculoskeletal neck pain  Patient presents for neck pain which is worse with movement. Vital signs are stable and she is well-appearing. No history of fever and she is afebrile here. I do not suspect meningitis. Her neurological exam was normal. This is most likely musculoskeletal pain. I discussed taking a muscle relaxer to help relieve the pain. I also gave her 6 hydrocodone for pain. She can follow-up with her primary care physician. I gave her strict return precautions and she verbally agrees with the plan.   Catha Gosselin, PA-C 09/15/14 1933  Blake Divine, MD 09/18/14 1434

## 2014-09-15 NOTE — ED Notes (Signed)
Pt states she is unable to turn her head from side to side d/t pain.  Pt is able to touch chin to chest with only minimal pain.  Denies nausea/changes in vision or photophobia.

## 2014-12-18 ENCOUNTER — Ambulatory Visit (INDEPENDENT_AMBULATORY_CARE_PROVIDER_SITE_OTHER): Payer: Medicare Other | Admitting: Podiatry

## 2014-12-18 DIAGNOSIS — M79674 Pain in right toe(s): Secondary | ICD-10-CM | POA: Diagnosis not present

## 2014-12-18 DIAGNOSIS — M79675 Pain in left toe(s): Secondary | ICD-10-CM | POA: Diagnosis not present

## 2014-12-18 DIAGNOSIS — E119 Type 2 diabetes mellitus without complications: Secondary | ICD-10-CM

## 2014-12-18 DIAGNOSIS — M79676 Pain in unspecified toe(s): Secondary | ICD-10-CM | POA: Diagnosis not present

## 2014-12-18 DIAGNOSIS — B351 Tinea unguium: Secondary | ICD-10-CM | POA: Diagnosis not present

## 2014-12-18 NOTE — Progress Notes (Signed)
Patient ID: Linda Chase, female   DOB: [birth year], 59 y.o.   MRN: [id number] Complaint:  Visit Type: Patient returns to my office for continued preventative foot care services. Complaint: Patient states" my nails have grown long and thick and become painful to walk and wear shoes" Patient has been diagnosed with DM with neuropathy.. She  presents for preventative foot care services. No changes to ROS  Podiatric Exam: Vascular: dorsalis pedis and posterior tibial pulses are palpable bilateral. Capillary return is immediate. Temperature gradient is WNL. Skin turgor WNL  Sensorium: Normal Semmes Weinstein monofilament test. Normal tactile sensation bilaterally. Nail Exam: Pt has thick disfigured discolored nails with subungual debris noted bilateral entire nail hallux through fifth toenails Ulcer Exam: There is no evidence of ulcer or pre-ulcerative changes or infection. Orthopedic Exam: Muscle tone and strength are WNL. No limitations in general ROM. No crepitus or effusions noted. Foot type and digits show no abnormalities. Bony prominences are unremarkable. Skin: No Porokeratosis. No infection or ulcers.  Asymptomatic plantar callus feet B/L  Diagnosis:  Tinea unguium, Pain in right toe, pain in left toes  Treatment & Plan Procedures and Treatment: Consent by patient was obtained for treatment procedures. The patient understood the discussion of treatment and procedures well. All questions were answered thoroughly reviewed. Debridement of mycotic and hypertrophic toenails, 1 through 5 bilateral and clearing of subungual debris. No ulceration, no infection noted.  Return Visit-Office Procedure: Patient instructed to return to the office for a follow up visit 10 weeks  for continued evaluation and treatment.  Helane GuntherGregory Baker Moronta DPM

## 2015-06-18 ENCOUNTER — Ambulatory Visit: Payer: Medicare Other | Admitting: Podiatry

## 2015-06-25 ENCOUNTER — Ambulatory Visit: Payer: Medicare Other | Admitting: Podiatry

## 2015-07-02 ENCOUNTER — Encounter: Payer: Self-pay | Admitting: Podiatry

## 2015-07-02 ENCOUNTER — Ambulatory Visit (INDEPENDENT_AMBULATORY_CARE_PROVIDER_SITE_OTHER): Payer: Medicare Other | Admitting: Podiatry

## 2015-07-02 DIAGNOSIS — M79676 Pain in unspecified toe(s): Secondary | ICD-10-CM | POA: Diagnosis not present

## 2015-07-02 DIAGNOSIS — M79675 Pain in left toe(s): Secondary | ICD-10-CM | POA: Diagnosis not present

## 2015-07-02 DIAGNOSIS — B351 Tinea unguium: Secondary | ICD-10-CM | POA: Diagnosis not present

## 2015-07-02 DIAGNOSIS — E119 Type 2 diabetes mellitus without complications: Secondary | ICD-10-CM | POA: Diagnosis not present

## 2015-07-02 NOTE — Progress Notes (Signed)
Patient ID: Linda Chase, female   DOB: 07/05/1955, 59 y.o.   MRN: [id number] Complaint:  Visit Type: Patient returns to my office for continued preventative foot care services. Complaint: Patient states" my nails have grown long and thick and become painful to walk and wear shoes" Patient has been diagnosed with DM with neuropathy.. She  presents for preventative foot care services. No changes to ROS  Podiatric Exam: Vascular: dorsalis pedis and posterior tibial pulses are palpable bilateral. Capillary return is immediate. Temperature gradient is WNL. Skin turgor WNL  Sensorium: Normal Semmes Weinstein monofilament test. Normal tactile sensation bilaterally. Nail Exam: Pt has thick disfigured discolored nails with subungual debris noted bilateral entire nail hallux through fifth toenails Ulcer Exam: There is no evidence of ulcer or pre-ulcerative changes or infection. Orthopedic Exam: Muscle tone and strength are WNL. No limitations in general ROM. No crepitus or effusions noted. Foot type and digits show no abnormalities. Bony prominences are unremarkable. Skin: No Porokeratosis. No infection or ulcers.  Asymptomatic plantar callus feet B/L  Diagnosis:  Tinea unguium, Pain in right toe, pain in left toes  Treatment & Plan Procedures and Treatment: Consent by patient was obtained for treatment procedures. The patient understood the discussion of treatment and procedures well. All questions were answered thoroughly reviewed. Debridement of mycotic and hypertrophic toenails, 1 through 5 bilateral and clearing of subungual debris. No ulceration, no infection noted.  Return Visit-Office Procedure: Patient instructed to return to the office for a follow up visit 10 weeks  for continued evaluation and treatment.  [name] DPM 

## 2015-10-09 ENCOUNTER — Emergency Department (HOSPITAL_COMMUNITY): Payer: Medicare Other

## 2015-10-09 ENCOUNTER — Emergency Department (HOSPITAL_COMMUNITY)
Admission: EM | Admit: 2015-10-09 | Discharge: 2015-10-09 | Disposition: A | Discharge status: Home / Self Care | Payer: Medicare Other | Attending: Emergency Medicine | Admitting: Emergency Medicine

## 2015-10-09 ENCOUNTER — Encounter (HOSPITAL_COMMUNITY): Payer: Self-pay | Admitting: Physical Medicine and Rehabilitation

## 2015-10-09 DIAGNOSIS — M7918 Myalgia, other site: Secondary | ICD-10-CM

## 2015-10-09 DIAGNOSIS — Y9389 Activity, other specified: Secondary | ICD-10-CM | POA: Diagnosis not present

## 2015-10-09 DIAGNOSIS — X501XXA Overexertion from prolonged static or awkward postures, initial encounter: Secondary | ICD-10-CM | POA: Insufficient documentation

## 2015-10-09 DIAGNOSIS — M533 Sacrococcygeal disorders, not elsewhere classified: Secondary | ICD-10-CM | POA: Diagnosis present

## 2015-10-09 DIAGNOSIS — Y929 Unspecified place or not applicable: Secondary | ICD-10-CM | POA: Diagnosis not present

## 2015-10-09 DIAGNOSIS — M545 Low back pain: Secondary | ICD-10-CM | POA: Diagnosis not present

## 2015-10-09 DIAGNOSIS — Z79899 Other long term (current) drug therapy: Secondary | ICD-10-CM | POA: Diagnosis not present

## 2015-10-09 DIAGNOSIS — F17210 Nicotine dependence, cigarettes, uncomplicated: Secondary | ICD-10-CM | POA: Insufficient documentation

## 2015-10-09 DIAGNOSIS — I10 Essential (primary) hypertension: Secondary | ICD-10-CM | POA: Diagnosis not present

## 2015-10-09 DIAGNOSIS — J449 Chronic obstructive pulmonary disease, unspecified: Secondary | ICD-10-CM | POA: Diagnosis not present

## 2015-10-09 DIAGNOSIS — Y999 Unspecified external cause status: Secondary | ICD-10-CM | POA: Insufficient documentation

## 2015-10-09 DIAGNOSIS — E119 Type 2 diabetes mellitus without complications: Secondary | ICD-10-CM | POA: Diagnosis not present

## 2015-10-09 DIAGNOSIS — Z7984 Long term (current) use of oral hypoglycemic drugs: Secondary | ICD-10-CM | POA: Diagnosis not present

## 2015-10-09 HISTORY — DX: Unspecified osteoarthritis, unspecified site: M19.90

## 2015-10-09 MED ORDER — NAPROXEN 375 MG PO TABS: 375.0000 mg | ORAL_TABLET | Freq: Two times a day (BID) | ORAL | 0 refills | Status: DC

## 2015-10-09 NOTE — ED Triage Notes (Signed)
Pt reports L buttock pain, ongoing since Tuesday. Denies recent injury. States L buttock feels sore and is more painful when sitting down.

## 2015-10-09 NOTE — ED Provider Notes (Signed)
[facility]-EMERGENCY DEPT Provider Note   CSN: [id number] Arrival date & time: 10/09/15  1420  By signing my name below, I, Soijett Blue, attest that this documentation has been prepared under the direction and in the presence of Kerrie BuffaloHope Neese, NP Electronically Signed: Soijett Blue, ED Scribe. 10/09/15. 4:26 PM.   History   Chief Complaint Chief Complaint  Patient presents with  . Rectal Pain    HPI  Linda Chase is a 60 y.o. female with a PMHx of DM, HTN, who presents to the Emergency Department complaining of tailbone pain onset 2 days. Pt reports that her tailbone pain is worsened with position change and prolonged sitting. Pt denies any alleviating factors. Pt denies recent fall or injury at this time. Pt states that she typically ambulates with a cane. Pt reports that she has associated symptoms of gait problem due to pain. She notes that she has tried ibuprofen with no relief of her symptoms. She denies color change, rash, wound, numbness, tingling, abdominal pain, nausea, vomiting, and any other symptoms.   Nursing notes reviewed.  Patient denies rectal pain, difficulty or pain with stool. Pain occurs with sitting and going to standing.   The history is provided by the patient. No language interpreter was used.    Past Medical History:  Diagnosis Date  . Arthritis   . Cerebral aneurysm    x2  . Depression   . Diabetes mellitus   . Hypertension     Patient Active Problem List   Diagnosis Date Noted  . Influenza A (H1N1) 02/03/2012  . COPD exacerbation (HCC) 02/02/2012  . Diabetes mellitus (HCC) 02/01/2012  . Acute respiratory failure with hypercapnia (HCC) 02/01/2012  . SIRS (systemic inflammatory response syndrome) (HCC) 02/01/2012  . Hypertension 02/01/2012  . Depression 02/01/2012  . Obesity 02/01/2012    Past Surgical History:  Procedure Laterality Date  . BRAIN SURGERY     double aneurysm    OB History    No data available       Home Medications     Prior to Admission medications   Medication Sig Start Date End Date Taking? Authorizing Provider  amLODipine (NORVASC) 5 MG tablet Take 1 tablet (5 mg total) by mouth daily. Patient taking differently: Take 5 mg by mouth every evening.  02/05/12  Yes Catarina Hartshornavid Tat, MD  cholecalciferol (VITAMIN D) 1000 units tablet Take 2,000 Units by mouth daily. Gummies   Yes Historical Provider, MD  hydrochlorothiazide (HYDRODIURIL) 25 MG tablet Take 25 mg by mouth every morning. 07/31/15  Yes Historical Provider, MD  labetalol (NORMODYNE) 200 MG tablet Take 200 mg by mouth 2 (two) times daily.     Yes Historical Provider, MD  lisinopril (PRINIVIL,ZESTRIL) 40 MG tablet Take 40 mg by mouth every morning.   Yes Historical Provider, MD  metFORMIN (GLUCOPHAGE) 1000 MG tablet Take 1,000 mg by mouth 2 (two) times daily with a meal.   Yes Historical Provider, MD  nystatin-triamcinolone ointment (MYCOLOG) Apply 1 application topically every other day.  07/31/15  Yes Historical Provider, MD  naproxen (NAPROSYN) 375 MG tablet Take 1 tablet (375 mg total) by mouth 2 (two) times daily. 10/09/15   Hope Orlene OchM Neese, NP    Family History No family history on file.  Social History Social History  Substance Use Topics  . Smoking status: Current Some Day Smoker    Types: Cigarettes  . Smokeless tobacco: Never Used  . Alcohol use No     Allergies   Hydrocodone  Review of Systems Review of Systems  Gastrointestinal: Negative for abdominal pain, nausea and vomiting.  Musculoskeletal: Positive for arthralgias (tailbone) and gait problem (due to pain).  Skin: Negative for color change, rash and wound.  Neurological: Negative for numbness.       No tingling  All other systems reviewed and are negative.    Physical Exam Updated Vital Signs BP 151/93   Pulse 91   Temp 98.1 F (36.7 C) (Oral)   Resp 18   SpO2 98%   Physical Exam  Constitutional: She is oriented to person, place, and time. She appears  well-developed and well-nourished. No distress.  HENT:  Head: Normocephalic and atraumatic.  Eyes: EOM are normal.  Neck: Neck supple.  Cardiovascular: Normal rate and intact distal pulses.   Distal pulses are intact  Pulmonary/Chest: Effort normal. No respiratory distress.  Abdominal: She exhibits no distension.  Genitourinary:  Genitourinary Comments: Tenderness over left buttock.   Musculoskeletal: Normal range of motion. She exhibits no edema.       Lumbar back: She exhibits tenderness.  Tenderness over coccyx. No LE edema or erythema.   Neurological: She is alert and oriented to person, place, and time.  Skin: Skin is warm and dry.  Psychiatric: She has a normal mood and affect. Her behavior is normal.  Nursing note and vitals reviewed.    ED Treatments / Results  DIAGNOSTIC STUDIES: Oxygen Saturation is 98% on RA, nl by my interpretation.    COORDINATION OF CARE: 4:24 PM Discussed treatment plan with pt at bedside which includes sacrum/coccyx xray and pt agreed to plan.   Radiology Dg Sacrum/coccyx  Result Date: 10/09/2015 CLINICAL DATA:  Sacrococcygeal pain for 1 day.  No known injury. EXAM: SACRUM AND COCCYX - 2+ VIEW COMPARISON:  None. FINDINGS: There is no evidence of fracture or other focal bone lesions. At least 1 calcified uterine fibroid is seen in the anterior pelvis. IMPRESSION: No acute findings.  Small calcified uterine fibroid noted. Electronically Signed   By: Myles Rosenthal M.D.   On: 10/09/2015 17:18    Procedures Procedures (including critical care time)  Medications Ordered in ED Medications - No data to display   Initial Impression / Assessment and Plan / ED Course  I have reviewed the triage vital signs and the nursing notes.  Pertinent imaging results that were available during my care of the patient were reviewed by me and considered in my medical decision making (see chart for details).  Clinical Course   Dr. Verdie Mosher in to examine the patient and  discuss x-ray results and plan of care.   Final Clinical Impressions(s) / ED Diagnoses  60 y.o. female with coccyx and left buttock pain stable for d/c without other symptoms. Will treat with NSAIDS she will f/u with her PCP.  Final diagnoses:  Pain in left buttock    New Prescriptions New Prescriptions   NAPROXEN (NAPROSYN) 375 MG TABLET    Take 1 tablet (375 mg total) by mouth 2 (two) times daily.   I personally performed the services described in this documentation, which was scribed in my presence. The recorded information has been reviewed and is accurate.     [address] [place], NP 10/09/15 1856    Lavera Guise, MD 10/10/15 1610    Lavera Guise, MD 10/10/15 [id number]

## 2015-10-09 NOTE — ED Notes (Signed)
Pt d/c home, ambulatory. 

## 2015-11-27 ENCOUNTER — Encounter: Payer: Self-pay | Admitting: Podiatry

## 2015-11-27 ENCOUNTER — Ambulatory Visit (INDEPENDENT_AMBULATORY_CARE_PROVIDER_SITE_OTHER): Payer: Medicare Other | Admitting: Podiatry

## 2015-11-27 VITALS — BP 132/106 | HR 89 | Resp 18 | Ht 66.0 in | Wt 300.0 lb

## 2015-11-27 DIAGNOSIS — B351 Tinea unguium: Secondary | ICD-10-CM | POA: Diagnosis not present

## 2015-11-27 DIAGNOSIS — M79675 Pain in left toe(s): Secondary | ICD-10-CM | POA: Diagnosis not present

## 2015-11-27 DIAGNOSIS — Q828 Other specified congenital malformations of skin: Secondary | ICD-10-CM | POA: Diagnosis not present

## 2015-11-27 DIAGNOSIS — E119 Type 2 diabetes mellitus without complications: Secondary | ICD-10-CM

## 2015-11-27 NOTE — Progress Notes (Signed)
Patient ID: Linda Chase, female   DOB: 03/05/1955, 60 y.o.   MRN: [id number] Complaint:  Visit Type: Patient returns to my office for continued preventative foot care services. Complaint: Patient states" my nails have grown long and thick and become painful to walk and wear shoes" Patient has been diagnosed with DM with neuropathy.. She  presents for preventative foot care services. No changes to ROS  Podiatric Exam: Vascular: dorsalis pedis and posterior tibial pulses are palpable bilateral. Capillary return is immediate. Temperature gradient is WNL. Skin turgor WNL  Sensorium: Normal Semmes Weinstein monofilament test. Normal tactile sensation bilaterally. Nail Exam: Pt has thick disfigured discolored nails with subungual debris noted bilateral entire nail hallux through fifth toenails Ulcer Exam: There is no evidence of ulcer or pre-ulcerative changes or infection. Orthopedic Exam: Muscle tone and strength are WNL. No limitations in general ROM. No crepitus or effusions noted. Foot type and digits show no abnormalities. Bony prominences are unremarkable. Skin:  Porokeratosis sub 5th left and sub 2 right. No infection or ulcers.    Diagnosis:  Tinea unguium, Pain in right toe, pain in left toes  Treatment & Plan Procedures and Treatment: Consent by patient was obtained for treatment procedures. The patient understood the discussion of treatment and procedures well. All questions were answered thoroughly reviewed. Debridement of mycotic and hypertrophic toenails, 1 through 5 bilateral and clearing of subungual debris. No ulceration, no infection noted. Debride porokeratosis Return Visit-Office Procedure: Patient instructed to return to the office for a follow up visit 10 weeks  for continued evaluation and treatment.  Sheree Lalla DPM 

## 2015-12-23 ENCOUNTER — Ambulatory Visit: Payer: Medicare Other | Admitting: Gynecology

## 2016-04-29 ENCOUNTER — Ambulatory Visit (INDEPENDENT_AMBULATORY_CARE_PROVIDER_SITE_OTHER): Payer: Medicare Other | Admitting: Podiatry

## 2016-04-29 ENCOUNTER — Encounter: Payer: Self-pay | Admitting: Podiatry

## 2016-04-29 DIAGNOSIS — M79675 Pain in left toe(s): Secondary | ICD-10-CM | POA: Diagnosis not present

## 2016-04-29 DIAGNOSIS — Q828 Other specified congenital malformations of skin: Secondary | ICD-10-CM

## 2016-04-29 DIAGNOSIS — B351 Tinea unguium: Secondary | ICD-10-CM | POA: Diagnosis not present

## 2016-04-29 DIAGNOSIS — E119 Type 2 diabetes mellitus without complications: Secondary | ICD-10-CM

## 2016-04-29 NOTE — Progress Notes (Signed)
Patient ID: Linda Chase, female   DOB: 12/10/1955, 60 y.o.   MRN: [id number] Complaint:  Visit Type: Patient returns to my office for continued preventative foot care services. Complaint: Patient states" my nails have grown long and thick and become painful to walk and wear shoes" Patient has been diagnosed with DM with neuropathy.. She  presents for preventative foot care services. No changes to ROS  Podiatric Exam: Vascular: dorsalis pedis and posterior tibial pulses are palpable bilateral. Capillary return is immediate. Temperature gradient is WNL. Skin turgor WNL  Sensorium: Normal Semmes Weinstein monofilament test. Normal tactile sensation bilaterally. Nail Exam: Pt has thick disfigured discolored nails with subungual debris noted bilateral entire nail hallux through fifth toenails Ulcer Exam: There is no evidence of ulcer or pre-ulcerative changes or infection. Orthopedic Exam: Muscle tone and strength are WNL. No limitations in general ROM. No crepitus or effusions noted. Foot type and digits show no abnormalities. Bony prominences are unremarkable. Skin:  Porokeratosis sub 5th left and sub 2 right. No infection or ulcers.    Diagnosis:  Tinea unguium, Pain in right toe, pain in left toes  Treatment & Plan Procedures and Treatment: Consent by patient was obtained for treatment procedures. The patient understood the discussion of treatment and procedures well. All questions were answered thoroughly reviewed. Debridement of mycotic and hypertrophic toenails, 1 through 5 bilateral and clearing of subungual debris. No ulceration, no infection noted. Debride porokeratosis Return Visit-Office Procedure: Patient instructed to return to the office for a follow up visit 10 weeks  for continued evaluation and treatment.  Muhsin Doris DPM 

## 2016-05-26 ENCOUNTER — Other Ambulatory Visit: Payer: Self-pay | Admitting: Internal Medicine

## 2016-05-26 DIAGNOSIS — Z1231 Encounter for screening mammogram for malignant neoplasm of breast: Secondary | ICD-10-CM

## 2016-06-14 ENCOUNTER — Ambulatory Visit
Admission: RE | Admit: 2016-06-14 | Discharge: 2016-06-14 | Disposition: A | Discharge status: Home / Self Care | Payer: Medicare Other | Source: Ambulatory Visit | Attending: Internal Medicine | Admitting: Internal Medicine

## 2016-06-14 DIAGNOSIS — Z1231 Encounter for screening mammogram for malignant neoplasm of breast: Secondary | ICD-10-CM

## 2016-09-15 ENCOUNTER — Encounter: Payer: Self-pay | Admitting: Podiatry

## 2016-09-15 ENCOUNTER — Ambulatory Visit (INDEPENDENT_AMBULATORY_CARE_PROVIDER_SITE_OTHER): Payer: Medicare Other | Admitting: Podiatry

## 2016-09-15 DIAGNOSIS — E119 Type 2 diabetes mellitus without complications: Secondary | ICD-10-CM | POA: Diagnosis not present

## 2016-09-15 DIAGNOSIS — B351 Tinea unguium: Secondary | ICD-10-CM | POA: Diagnosis not present

## 2016-09-15 DIAGNOSIS — M79675 Pain in left toe(s): Secondary | ICD-10-CM

## 2016-09-15 DIAGNOSIS — Q828 Other specified congenital malformations of skin: Secondary | ICD-10-CM | POA: Diagnosis not present

## 2016-09-15 NOTE — Progress Notes (Signed)
Patient ID: Linda Chase, female   DOB: 12/02/1955, 61 y.o.   MRN: [id number] Complaint:  Visit Type: Patient returns to my office for continued preventative foot care services. Complaint: Patient states" my nails have grown long and thick and become painful to walk and wear shoes" Patient has been diagnosed with DM with neuropathy.. She  presents for preventative foot care services. No changes to ROS  Podiatric Exam: Vascular: dorsalis pedis and posterior tibial pulses are palpable bilateral. Capillary return is immediate. Temperature gradient is WNL. Skin turgor WNL  Sensorium: Normal Semmes Weinstein monofilament test. Normal tactile sensation bilaterally. Nail Exam: Pt has thick disfigured discolored nails with subungual debris noted bilateral entire nail hallux through fifth toenails Ulcer Exam: There is no evidence of ulcer or pre-ulcerative changes or infection. Orthopedic Exam: Muscle tone and strength are WNL. No limitations in general ROM. No crepitus or effusions noted. Foot type and digits show no abnormalities. Bony prominences are unremarkable. Skin:  Porokeratosis sub 5th left and sub 2 right. No infection or ulcers.    Diagnosis:  Tinea unguium, Pain in right toe, pain in left toes  Treatment & Plan Procedures and Treatment: Consent by patient was obtained for treatment procedures. The patient understood the discussion of treatment and procedures well. All questions were answered thoroughly reviewed. Debridement of mycotic and hypertrophic toenails, 1 through 5 bilateral and clearing of subungual debris. No ulceration, no infection noted. Debride porokeratosis Return Visit-Office Procedure: Patient instructed to return to the office for a follow up visit 10 weeks  for continued evaluation and treatment.  Linda Chase DPM

## 2016-11-25 ENCOUNTER — Emergency Department (HOSPITAL_COMMUNITY)
Admission: EM | Admit: 2016-11-25 | Discharge: 2016-11-25 | Discharge status: Against Medical Advice | Payer: Medicare Other | Attending: Emergency Medicine | Admitting: Emergency Medicine

## 2016-11-25 ENCOUNTER — Emergency Department (HOSPITAL_COMMUNITY): Payer: Medicare Other

## 2016-11-25 ENCOUNTER — Other Ambulatory Visit: Payer: Self-pay

## 2016-11-25 ENCOUNTER — Encounter (HOSPITAL_COMMUNITY): Payer: Self-pay | Admitting: Emergency Medicine

## 2016-11-25 DIAGNOSIS — J449 Chronic obstructive pulmonary disease, unspecified: Secondary | ICD-10-CM | POA: Insufficient documentation

## 2016-11-25 DIAGNOSIS — Z7984 Long term (current) use of oral hypoglycemic drugs: Secondary | ICD-10-CM | POA: Diagnosis not present

## 2016-11-25 DIAGNOSIS — K122 Cellulitis and abscess of mouth: Secondary | ICD-10-CM | POA: Diagnosis not present

## 2016-11-25 DIAGNOSIS — E119 Type 2 diabetes mellitus without complications: Secondary | ICD-10-CM | POA: Diagnosis not present

## 2016-11-25 DIAGNOSIS — R0989 Other specified symptoms and signs involving the circulatory and respiratory systems: Secondary | ICD-10-CM | POA: Diagnosis present

## 2016-11-25 DIAGNOSIS — I10 Essential (primary) hypertension: Secondary | ICD-10-CM | POA: Diagnosis not present

## 2016-11-25 DIAGNOSIS — F17210 Nicotine dependence, cigarettes, uncomplicated: Secondary | ICD-10-CM | POA: Insufficient documentation

## 2016-11-25 DIAGNOSIS — Z79899 Other long term (current) drug therapy: Secondary | ICD-10-CM | POA: Diagnosis not present

## 2016-11-25 MED ORDER — DIPHENHYDRAMINE HCL 50 MG/ML IJ SOLN
25.0000 mg | Freq: Once | INTRAMUSCULAR | Status: AC
  Administered 2016-11-25: 25 mg via INTRAVENOUS
  Filled 2016-11-25: qty 1

## 2016-11-25 MED ORDER — CLINDAMYCIN PHOSPHATE 600 MG/50ML IV SOLN
600.0000 mg | Freq: Once | INTRAVENOUS | Status: AC
  Administered 2016-11-25: 600 mg via INTRAVENOUS
  Filled 2016-11-25: qty 50

## 2016-11-25 MED ORDER — FAMOTIDINE IN NACL 20-0.9 MG/50ML-% IV SOLN
20.0000 mg | Freq: Once | INTRAVENOUS | Status: AC
  Administered 2016-11-25: 20 mg via INTRAVENOUS
  Filled 2016-11-25: qty 50

## 2016-11-25 MED ORDER — METHYLPREDNISOLONE 4 MG PO TBPK: ORAL_TABLET | ORAL | 0 refills | Status: DC

## 2016-11-25 MED ORDER — METHYLPREDNISOLONE SODIUM SUCC 125 MG IJ SOLR
125.0000 mg | Freq: Once | INTRAMUSCULAR | Status: AC
  Administered 2016-11-25: 125 mg via INTRAVENOUS
  Filled 2016-11-25: qty 2

## 2016-11-25 MED ORDER — CLINDAMYCIN HCL 300 MG PO CAPS: 300.0000 mg | ORAL_CAPSULE | Freq: Three times a day (TID) | ORAL | 0 refills | Status: DC

## 2016-11-25 NOTE — ED Notes (Addendum)
Per MD, no coffee that patient is requesting at this time. Warm blankets given. Uvula does appear to be swollen and little to no clearance between tonsils and uvula. Pt appears to be in no distress. Talking without issue.

## 2016-11-25 NOTE — Discharge Instructions (Signed)
The swelling of the throat is likely due to the lisinopril that you are taking.  I would recommend stopping this medicine.  Make sure you touch base with your primary care doctor and have them change you to a different blood pressure medicine.  I am also given you a steroid pack to start tomorrow.  Take as directed.  I have also given you antibiotics to take as directed for 7 days.  It is very important that she have close follow-up with your primary care doctor in 24-48 hours.  Again please return the ED if you have any difficulties breathing, worsening swelling, worsening difficulty swallowing.  If you develop any new symptoms please call [phone].  Please make sure you are taking an soft liquids and foods to your swelling decreases to prevent possibility of choking on the food.

## 2016-11-25 NOTE — ED Triage Notes (Signed)
Patient reports that she feels that "something is stuck" in her throat beginning approx 0715. Reports that she has experienced the same symptoms approx 2 years. No resp distress noted.

## 2016-11-25 NOTE — ED Notes (Signed)
Pt reports she desires to leave; PA and nurse st bedside to discuss risks. She accepts. Was able to drink and eat cracker without distress.

## 2016-11-30 NOTE — ED Provider Notes (Signed)
[facility] EMERGENCY DEPARTMENT Provider Note   CSN: [id number] Arrival date & time: 11/25/16  4098     History   Chief Complaint Chief Complaint  Patient presents with  . Foreign Body in Throat    HPI Linda Chase is a 61 y.o. female.  HPI 61 year old African American female past medical history significant for diabetes, hypertension, depression who presents to the emergency department today with complaints of throat swelling.  Patient states that she awoke from sleep this morning and noticed that the back of her throat was swelling.  Patient states she has had this happen before approximately 3 years ago and was diagnosed with uvulitis.  Patient states that she is on lisinopril and has been for several years.  States that time her lisinopril was not stopped because it was thought it was due to infectious cause.  Patient reports foreign body sensation in her throat, dysphagia, sore throat and painful swallowing starting this morning.  Patient does report some nasal congestion runny nose.  She denies any other associated symptoms.  Patient has not taking for her symptoms prior to arrival.  Patient states she is able to tolerate p.o. fluids and denies any difficulties breathing at this time.  Pt denies any fever, chill, ha, vision changes, lightheadedness, dizziness, congestion, neck pain, cp, sob, cough, abd pain, n/v/d, urinary symptoms, change in bowel habits, melena, hematochezia, lower extremity paresthesias.  Past Medical History:  Diagnosis Date  . Arthritis   . Cerebral aneurysm    x2  . Depression   . Diabetes mellitus   . Hypertension     Patient Active Problem List   Diagnosis Date Noted  . Influenza A (H1N1) 02/03/2012  . COPD exacerbation (HCC) 02/02/2012  . Diabetes mellitus (HCC) 02/01/2012  . Acute respiratory failure with hypercapnia (HCC) 02/01/2012  . SIRS (systemic inflammatory response syndrome) (HCC) 02/01/2012  . Hypertension  02/01/2012  . Depression 02/01/2012  . Obesity 02/01/2012    Past Surgical History:  Procedure Laterality Date  . BRAIN SURGERY     double aneurysm    OB History    No data available       Home Medications    Prior to Admission medications   Medication Sig Start Date End Date Taking? Authorizing Provider  amLODipine (NORVASC) 5 MG tablet Take 1 tablet (5 mg total) by mouth daily. Patient taking differently: Take 5 mg by mouth every evening.  02/05/12  Yes Tat, Onalee Hua, MD  cholecalciferol (VITAMIN D) 1000 units tablet Take 2,000 Units by mouth daily. Gummies   Yes [provider]  hydrochlorothiazide (HYDRODIURIL) 25 MG tablet Take 25 mg by mouth every morning. 07/31/15  Yes [provider]  labetalol (NORMODYNE) 200 MG tablet Take 200 mg by mouth 2 (two) times daily.     Yes [provider]  lisinopril (PRINIVIL,ZESTRIL) 40 MG tablet Take 40 mg by mouth every morning.   Yes [provider]  metFORMIN (GLUCOPHAGE) 1000 MG tablet Take 1,000 mg by mouth 2 (two) times daily with a meal.   Yes [provider]  naproxen (NAPROSYN) 375 MG tablet Take 1 tablet (375 mg total) by mouth 2 (two) times daily. Patient taking differently: Take 375 mg 2 (two) times daily as needed by mouth for mild pain.  10/09/15  Yes Neese, Theodora Blow, NP  nystatin-triamcinolone ointment [facility]) Apply 1 application topically every other day.  07/31/15  Yes [provider]  clindamycin (CLEOCIN) 300 MG capsule Take 1 capsule (300  mg total) 3 (three) times daily by mouth. 11/25/16   Rise MuLeaphart, Aashvi Rezabek T, PA-C  methylPREDNISolone (MEDROL DOSEPAK) 4 MG TBPK tablet Take as directed 11/25/16   Rise MuLeaphart, Jetty Berland T, PA-C    Family History No family history on file.  Social History Social History   Tobacco Use  . Smoking status: Current Some Day Smoker    Types: Cigarettes  . Smokeless tobacco: Never Used  Substance Use Topics  . Alcohol use: No  . Drug use: No      Allergies   Hydrocodone   Review of Systems Review of Systems  Constitutional: Negative for chills and fever.  HENT: Positive for congestion, sore throat and trouble swallowing.   Eyes: Negative for visual disturbance.  Respiratory: Negative for cough and shortness of breath.   Cardiovascular: Negative for chest pain.  Gastrointestinal: Negative for abdominal pain, diarrhea, nausea and vomiting.  Genitourinary: Negative for dysuria, flank pain, frequency, hematuria, urgency, vaginal bleeding and vaginal discharge.  Musculoskeletal: Negative for arthralgias and myalgias.  Skin: Negative for rash.  Neurological: Negative for dizziness, syncope, weakness, light-headedness, numbness and headaches.  Psychiatric/Behavioral: Negative for sleep disturbance. The patient is not nervous/anxious.      Physical Exam Updated Vital Signs BP (!) 148/100   Pulse 77   Temp 97.7 F (36.5 C) (Oral)   Ht 5\' 4"  (1.626 m)   Wt 136.1 kg (300 lb)   SpO2 95%   BMI 51.49 kg/m   Physical Exam  Constitutional: She is oriented to person, place, and time. She appears well-developed and well-nourished.  Non-toxic appearance. No distress.  HENT:  Head: Normocephalic and atraumatic.  Nose: Nose normal.  Unable to visualize the posterior oropharynx due to edema of the tonsils and uvula.  Uvula is midline.  Edema noted.  No exudate.  No angioedema of the tongue or lips.  Patient is managing secretions tolerating her airway.  Patient speaking in complete sentences.  No muffled voice.  Eyes: Conjunctivae are normal. Pupils are equal, round, and reactive to light. Right eye exhibits no discharge. Left eye exhibits no discharge.  Neck: Normal range of motion. Neck supple.  Cardiovascular: Normal rate, regular rhythm, normal heart sounds and intact distal pulses. Exam reveals no gallop and no friction rub.  No murmur heard. Pulmonary/Chest: Effort normal and breath sounds normal. No stridor. No respiratory  distress. She has no wheezes. She has no rales. She exhibits no tenderness.  Abdominal: Soft. Bowel sounds are normal. There is no tenderness. There is no rebound and no guarding.  Musculoskeletal: Normal range of motion. She exhibits no tenderness.  Lymphadenopathy:    She has no cervical adenopathy.  Neurological: She is alert and oriented to person, place, and time.  Skin: Skin is warm and dry. Capillary refill takes less than 2 seconds.  Psychiatric: Her behavior is normal. Judgment and thought content normal.  Nursing note and vitals reviewed.    ED Treatments / Results  Labs (all labs ordered are listed, but only abnormal results are displayed) Labs Reviewed - No data to display  EKG  EKG Interpretation None       Radiology No results found.  Procedures Procedures (including critical care time)  Medications Ordered in ED Medications  methylPREDNISolone sodium succinate (SOLU-MEDROL) 125 mg/2 mL injection 125 mg (125 mg Intravenous Given 11/25/16 1019)  diphenhydrAMINE (BENADRYL) injection 25 mg (25 mg Intravenous Given 11/25/16 1019)  clindamycin (CLEOCIN) IVPB 600 mg (0 mg Intravenous Stopped 11/25/16 1246)  famotidine (PEPCID) IVPB 20 mg  premix (0 mg Intravenous Stopped 11/25/16 1325)     Initial Impression / Assessment and Plan / ED Course  I have reviewed the triage vital signs and the nursing notes.  Pertinent labs & imaging results that were available during my care of the patient were reviewed by me and considered in my medical decision making (see chart for details).     Patient presents to the ED with complaints of foreign body sensation in throat and swelling oropharynx.  Patient states this started this morning.  History of same 3 years ago and was diagnosed with uvulitis.  Patient is currently taking lisinopril.  On exam patient is overall well-appearing and nontoxic.  Vital signs are reassuring.  Patient is afebrile.  She is speaking complete sentences  and managing her airway.  Does appear to be in any respiratory distress.  She is tolerating p.o. fluids.  The uvula is enlarged.  Possibly infectious cause versus angioedema from lisinopril.  Patient given Solu-Medrol, Benadryl, Pepcid.  She was watched for several hours in the ED.  To speak with the ENT on-call concerning patient.  They felt like this is likely angioedema and patient should be watched.  Discussed with patient that she needs to discontinue her lisinopril.  Patient watched for several hours and there is no worsening of her symptoms.  I offered patient admission to the hospital for observation.  Patient refused and states that she wants to leave AMA.  Patient does not appear to be any acute distress at this time.  Feel the patient would benefit from admission however she is adamant about leaving.  We will give her prescription for Solu-Medrol.  Patient treated with antibiotics to cover for any infectious uvulitis.  Patient encouraged to stop lisinopril follow-up with your PCP in 24 hours.  Return to the ED with any worsening symptoms.  Pt presents to the ED for sore throat. They have decided to leave AMA. I have discussed my concerns as a provider and the possibility that this may worsen. We discussed the nature, risks and benefits, and alternatives to treatment. I have specifically discussed that without further evaluation I cannot guarantee there is not a life threatening event occuring.  Time was given to allow the opportunity to ask questions and consider the options, and after the discussion, the patient decided to refuse the offered treatment. Pt is A&Ox4, his own POA and states understanding of my concerns and the possible consequences. After refusal, I made every reasonable opportunity to treat them to the best of my ability. I have made the patient aware that this is an AMA discharge, but they may return at any time for further evaluation and treatment.  Patient was seen and evaluated by  attending Dr. Erma HeritageIsaacs who is agreeable the above plan.    Final Clinical Impressions(s) / ED Diagnoses   Final diagnoses:  Uvulitis    ED Discharge Orders        Ordered    methylPREDNISolone (MEDROL DOSEPAK) 4 MG TBPK tablet     11/25/16 1328    clindamycin (CLEOCIN) 300 MG capsule  3 times daily     11/25/16 1328       Wallace KellerLeaphart, Piercen Covino T, PA-C 12/03/16 1541    Shaune PollackIsaacs, [place], MD 12/05/16 [phone]

## 2017-04-20 ENCOUNTER — Encounter: Payer: Self-pay | Admitting: Podiatry

## 2017-04-20 ENCOUNTER — Ambulatory Visit (INDEPENDENT_AMBULATORY_CARE_PROVIDER_SITE_OTHER): Payer: Medicare HMO | Admitting: Podiatry

## 2017-04-20 DIAGNOSIS — E119 Type 2 diabetes mellitus without complications: Secondary | ICD-10-CM | POA: Diagnosis not present

## 2017-04-20 DIAGNOSIS — M79675 Pain in left toe(s): Secondary | ICD-10-CM | POA: Diagnosis not present

## 2017-04-20 DIAGNOSIS — B351 Tinea unguium: Secondary | ICD-10-CM

## 2017-04-20 DIAGNOSIS — Q828 Other specified congenital malformations of skin: Secondary | ICD-10-CM

## 2017-04-20 NOTE — Progress Notes (Signed)
Patient ID: Linda Chase, female   DOB: [birth year], 62 y.o.   MRN: [id number] Complaint:  Visit Type: Patient returns to my office for continued preventative foot care services. Complaint: Patient states" my nails have grown long and thick and become painful to walk and wear shoes" Patient has been diagnosed with DM with neuropathy.. She  presents for preventative foot care services. No changes to ROS  Podiatric Exam: Vascular: dorsalis pedis and posterior tibial pulses are palpable bilateral. Capillary return is immediate. Temperature gradient is WNL. Skin turgor WNL  Sensorium: Normal Semmes Weinstein monofilament test. Normal tactile sensation bilaterally. Nail Exam: Pt has thick disfigured discolored nails with subungual debris noted bilateral entire nail hallux through fifth toenails Ulcer Exam: There is no evidence of ulcer or pre-ulcerative changes or infection. Orthopedic Exam: Muscle tone and strength are WNL. No limitations in general ROM. No crepitus or effusions noted. Foot type and digits show no abnormalities. Bony prominences are unremarkable. Skin:  Porokeratosis sub 5th left and sub 2 right. No infection or ulcers.    Diagnosis:  Tinea unguium, Pain in right toe, pain in left toes  Treatment & Plan Procedures and Treatment: Consent by patient was obtained for treatment procedures. The patient understood the discussion of treatment and procedures well. All questions were answered thoroughly reviewed. Debridement of mycotic and hypertrophic toenails, 1 through 5 bilateral and clearing of subungual debris. No ulceration, no infection noted. Debride porokeratosis Return Visit-Office Procedure: Patient instructed to return to the office for a follow up visit 10 weeks  for continued evaluation and treatment.  Helane GuntherGregory Jeanna Giuffre DPM

## 2017-06-24 ENCOUNTER — Encounter (HOSPITAL_COMMUNITY): Payer: Self-pay | Admitting: Family Medicine

## 2017-06-24 ENCOUNTER — Ambulatory Visit (HOSPITAL_COMMUNITY)
Admission: EM | Admit: 2017-06-24 | Discharge: 2017-06-24 | Disposition: A | Discharge status: Home / Self Care | Payer: Medicare HMO | Attending: Emergency Medicine | Admitting: Emergency Medicine

## 2017-06-24 DIAGNOSIS — M62830 Muscle spasm of back: Secondary | ICD-10-CM

## 2017-06-24 MED ORDER — KETOROLAC TROMETHAMINE 30 MG/ML IJ SOLN
30.0000 mg | Freq: Once | INTRAMUSCULAR | Status: AC
  Administered 2017-06-24: 30 mg via INTRAMUSCULAR

## 2017-06-24 MED ORDER — NAPROXEN 375 MG PO TABS: 375.0000 mg | ORAL_TABLET | Freq: Two times a day (BID) | ORAL | 0 refills | Status: DC

## 2017-06-24 MED ORDER — KETOROLAC TROMETHAMINE 30 MG/ML IJ SOLN
INTRAMUSCULAR | Status: AC
  Filled 2017-06-24: qty 1

## 2017-06-24 MED ORDER — METHOCARBAMOL 500 MG PO TABS: 500.0000 mg | ORAL_TABLET | Freq: Two times a day (BID) | ORAL | 0 refills | Status: DC

## 2017-06-24 NOTE — Discharge Instructions (Signed)
Discussed options of pain meds do not drive while taking.  Will need to see ortho  If she has any chest pain or sob will need to go to the ER  Take meds as needed.

## 2017-06-24 NOTE — ED Provider Notes (Signed)
[facility]    CSN: [id number] Arrival date & time: 06/24/17  1142     History   Chief Complaint Chief Complaint  Patient presents with  . Back Pain    HPI Linda Chase is a 62 y.o. female.   Pt c/o lt upper back pain for the past 3 days now. States that she has arthritits in back and has had this for 10 yrs. Has not seen an ortho since she moved her. The pain began with cleaning up her house. States that it is more with movement of lifting her arm. Denies any injury. No chest pain, no sob,. Has taken motrin with minimal relief.      Past Medical History:  Diagnosis Date  . Arthritis   . Cerebral aneurysm    x2  . Depression   . Diabetes mellitus   . Hypertension     Patient Active Problem List   Diagnosis Date Noted  . Influenza A (H1N1) 02/03/2012  . COPD exacerbation (HCC) 02/02/2012  . Diabetes mellitus (HCC) 02/01/2012  . Acute respiratory failure with hypercapnia (HCC) 02/01/2012  . SIRS (systemic inflammatory response syndrome) (HCC) 02/01/2012  . Hypertension 02/01/2012  . Depression 02/01/2012  . Obesity 02/01/2012    Past Surgical History:  Procedure Laterality Date  . BRAIN SURGERY     double aneurysm    OB History   None      Home Medications    Prior to Admission medications   Medication Sig Start Date End Date Taking? Authorizing Provider  amLODipine (NORVASC) 5 MG tablet Take 1 tablet (5 mg total) by mouth daily. Patient taking differently: Take 5 mg by mouth every evening.  02/05/12   Catarina Hartshorn, MD  cholecalciferol (VITAMIN D) 1000 units tablet Take 2,000 Units by mouth daily. Gummies    [provider]  hydrochlorothiazide (HYDRODIURIL) 25 MG tablet Take 25 mg by mouth every morning. 07/31/15   [provider]  labetalol (NORMODYNE) 200 MG tablet Take 200 mg by mouth 2 (two) times daily.      [provider]  lisinopril (PRINIVIL,ZESTRIL) 40 MG tablet Take 40 mg by mouth every morning.     [provider]  metFORMIN (GLUCOPHAGE) 1000 MG tablet Take 1,000 mg by mouth 2 (two) times daily with a meal.    [provider]  naproxen (NAPROSYN) 375 MG tablet Take 1 tablet (375 mg total) by mouth 2 (two) times daily. Patient taking differently: Take 375 mg 2 (two) times daily as needed by mouth for mild pain.  10/09/15   Janne Napoleon, NP  nystatin-triamcinolone ointment [facility]) Apply 1 application topically every other day.  07/31/15   [provider]    Family History History reviewed. No pertinent family history.  Social History Social History   Tobacco Use  . Smoking status: Current Some Day Smoker    Types: Cigarettes  . Smokeless tobacco: Never Used  Substance Use Topics  . Alcohol use: No  . Drug use: No     Allergies   Hydrocodone   Review of Systems Review of Systems  Respiratory: Negative.   Cardiovascular: Negative.   Gastrointestinal: Negative.   Musculoskeletal: Positive for back pain.       RT upper back pain more with movement of arm   Skin: Negative.   Neurological: Negative.      Physical Exam Triage Vital Signs ED Triage Vitals  Enc Vitals Group     BP 06/24/17 1213 115/75  Pulse Rate 06/24/17 1213 (!) 118     Resp 06/24/17 1213 18     Temp 06/24/17 1213 97.9 F (36.6 C)     Temp src --      SpO2 06/24/17 1213 98 %     Weight --      Height --      Head Circumference --      Peak Flow --      Pain Score 06/24/17 1212 9     Pain Loc --      Pain Edu? --      Excl. in GC? --    No data found.  Updated Vital Signs BP 115/75   Pulse (!) 118   Temp 97.9 F (36.6 C)   Resp 18   SpO2 98%   Visual Acuity Right Eye Distance:   Left Eye Distance:   Bilateral Distance:    Right Eye Near:   Left Eye Near:    Bilateral Near:     Physical Exam  Constitutional: She appears well-developed.  Neck: Normal range of motion.  Cardiovascular: Normal rate and regular rhythm.  Pulmonary/Chest: Effort  normal and breath sounds normal.  Abdominal: Soft. Bowel sounds are normal.  Musculoskeletal: She exhibits tenderness.  Tenderness to lt upper back radiates more lateral. Full ROM,   Neurological: She is alert.  Skin: Skin is warm. Capillary refill takes less than 2 seconds.     UC Treatments / Results  Labs (all labs ordered are listed, but only abnormal results are displayed) Labs Reviewed - No data to display  EKG None  Radiology No results found.  Procedures Procedures (including critical care time)  Medications Ordered in UC Medications  ketorolac (TORADOL) 30 MG/ML injection 30 mg (has no administration in time range)    Initial Impression / Assessment and Plan / UC Course  I have reviewed the triage vital signs and the nursing notes.  Pertinent labs & imaging results that were available during my care of the patient were reviewed by me and considered in my medical decision making (see chart for details).     Discussed options of pain meds do not drive while taking.  Will need to see ortho  If she has any chest pain or sob will need to go to the ER  Take meds as needed.  Final Clinical Impressions(s) / UC Diagnoses   Final diagnoses:  Muscle spasm of back     Discharge Instructions     Discussed options of pain meds do not drive while taking.  Will need to see ortho  If she has any chest pain or sob will need to go to the ER  Take meds as needed.     ED Prescriptions    None     Controlled Substance Prescriptions [place] Controlled Substance Registry consulted? Not Applicable   Coralyn MarkMitchell, Raven Furnas L, NP 06/24/17 1319

## 2017-06-24 NOTE — ED Triage Notes (Addendum)
Pt with hx of arthritis here for left upper back pain that started mid back and now more left sided. Denies injury. She has taken ibuprofen 800 mg. She is tachy in triage. Denies recent illness, or traveling.

## 2017-09-16 ENCOUNTER — Encounter: Payer: Self-pay | Admitting: Podiatry

## 2017-09-16 ENCOUNTER — Ambulatory Visit (INDEPENDENT_AMBULATORY_CARE_PROVIDER_SITE_OTHER): Payer: Medicare HMO | Admitting: Podiatry

## 2017-09-16 DIAGNOSIS — M79675 Pain in left toe(s): Secondary | ICD-10-CM | POA: Diagnosis not present

## 2017-09-16 DIAGNOSIS — Q828 Other specified congenital malformations of skin: Secondary | ICD-10-CM | POA: Diagnosis not present

## 2017-09-16 DIAGNOSIS — B351 Tinea unguium: Secondary | ICD-10-CM | POA: Diagnosis not present

## 2017-09-16 DIAGNOSIS — E119 Type 2 diabetes mellitus without complications: Secondary | ICD-10-CM | POA: Diagnosis not present

## 2017-09-16 NOTE — Progress Notes (Signed)
Patient ID: Linda Chase, female   DOB: Nov 20, 1955, 62 y.o.   MRN: [id number] Complaint:  Visit Type: Patient returns to my office for continued preventative foot care services. Complaint: Patient states" my nails have grown long and thick and become painful to walk and wear shoes" Patient has been diagnosed with DM with neuropathy.. She  presents for preventative foot care services. No changes to ROS  Podiatric Exam: Vascular: dorsalis pedis and posterior tibial pulses are palpable bilateral. Capillary return is immediate. Temperature gradient is WNL. Skin turgor WNL  Sensorium: Normal Semmes Weinstein monofilament test. Normal tactile sensation bilaterally. Nail Exam: Pt has thick disfigured discolored nails with subungual debris noted bilateral entire nail hallux through fifth toenails Ulcer Exam: There is no evidence of ulcer or pre-ulcerative changes or infection. Orthopedic Exam: Muscle tone and strength are WNL. No limitations in general ROM. No crepitus or effusions noted. Foot type and digits show no abnormalities. Bony prominences are unremarkable. Skin:  Porokeratosis sub 5th left . No infection or ulcers.    Diagnosis:  Tinea unguium, Pain in right toe, pain in left toes.  Debride porokeratosis sub 5th left.  Treatment & Plan Procedures and Treatment: Consent by patient was obtained for treatment procedures. The patient understood the discussion of treatment and procedures well. All questions were answered thoroughly reviewed. Debridement of mycotic and hypertrophic toenails, 1 through 5 bilateral and clearing of subungual debris. No ulceration, no infection noted. Debride porokeratosis Return Visit-Office Procedure: Patient instructed to return to the office for a follow up visit 3 months. for continued evaluation and treatment.  Helane Gunther DPM

## 2017-10-24 ENCOUNTER — Other Ambulatory Visit: Payer: Self-pay

## 2017-10-24 ENCOUNTER — Ambulatory Visit (HOSPITAL_COMMUNITY)
Admission: EM | Admit: 2017-10-24 | Discharge: 2017-10-24 | Disposition: A | Discharge status: Home / Self Care | Payer: Medicare HMO | Attending: Family Medicine | Admitting: Family Medicine

## 2017-10-24 ENCOUNTER — Encounter (HOSPITAL_COMMUNITY): Payer: Self-pay | Admitting: Emergency Medicine

## 2017-10-24 DIAGNOSIS — M79601 Pain in right arm: Secondary | ICD-10-CM | POA: Diagnosis not present

## 2017-10-24 DIAGNOSIS — M5489 Other dorsalgia: Secondary | ICD-10-CM | POA: Diagnosis not present

## 2017-10-24 DIAGNOSIS — M546 Pain in thoracic spine: Secondary | ICD-10-CM

## 2017-10-24 MED ORDER — KETOROLAC TROMETHAMINE 30 MG/ML IJ SOLN
INTRAMUSCULAR | Status: AC
  Filled 2017-10-24: qty 1

## 2017-10-24 MED ORDER — CYCLOBENZAPRINE HCL 5 MG PO TABS: 5.0000 mg | ORAL_TABLET | Freq: Three times a day (TID) | ORAL | 0 refills | Status: DC | PRN

## 2017-10-24 MED ORDER — DICLOFENAC SODIUM 50 MG PO TBEC: 50.0000 mg | DELAYED_RELEASE_TABLET | Freq: Two times a day (BID) | ORAL | 0 refills | Status: DC

## 2017-10-24 MED ORDER — KETOROLAC TROMETHAMINE 30 MG/ML IJ SOLN
30.0000 mg | Freq: Once | INTRAMUSCULAR | Status: AC
  Administered 2017-10-24: 30 mg via INTRAMUSCULAR

## 2017-10-24 NOTE — ED Provider Notes (Addendum)
[facility]    CSN: [id number] Arrival date & time: 10/24/17  1235     History   Chief Complaint Chief Complaint  Patient presents with  . Back Pain  . Arm Pain    bilateral    HPI Linda Chase is a 62 y.o. female history of hypertension, DM type II, presenting today for evaluation of back pain and arm pain.  Patient states that approximately 2 and half weeks ago she started with her left arm hurting.  Described as an achy sensation.  Since her left arm pain has improved, but has moved to her middle back and right arm.  Again her pain feels like an achy sensation.  It has been affecting her sleep but she has had difficulty getting comfortable.  Denies any specific motions that worsen her symptoms.  [facility], occasionally does not have machine.  Denies any specific injury or increase in activity.  Denies heavy lifting prior to onset.  Is concerned that her symptoms started after she had been sweating and went into a cold bus.  She has tried ibuprofen, Tylenol, Naprosyn, prednisone, Robaxin, Aspercreme as well as Norco.  Norco eased her pain, but symptoms return after it wore off.  Denies weakness.  Denies numbness or tingling.  Denies chest pain or shortness of breath.  Denies difficulty moving neck.  Headache, dizziness or lightheadedness.  Notes that she has a history of arthritis in her lower back.  HPI  Past Medical History:  Diagnosis Date  . Arthritis   . Cerebral aneurysm    x2  . Depression   . Diabetes mellitus   . Hypertension     Patient Active Problem List   Diagnosis Date Noted  . Influenza A (H1N1) 02/03/2012  . COPD exacerbation (HCC) 02/02/2012  . Diabetes mellitus (HCC) 02/01/2012  . Acute respiratory failure with hypercapnia (HCC) 02/01/2012  . SIRS (systemic inflammatory response syndrome) (HCC) 02/01/2012  . Hypertension 02/01/2012  . Depression 02/01/2012  . Obesity 02/01/2012    Past Surgical History:    Procedure Laterality Date  . BRAIN SURGERY     double aneurysm    OB History   None      Home Medications    Prior to Admission medications   Medication Sig Start Date End Date Taking? Authorizing Provider  amLODipine (NORVASC) 5 MG tablet Take 1 tablet (5 mg total) by mouth daily. Patient taking differently: Take 5 mg by mouth every evening.  02/05/12  Yes TatOnalee Hua, MD  Calcium-Phosphorus-Vitamin D (CALCIUM/VITAMIN D3/ADULT GUMMY PO) GUMMY VITAMIN D 2000 06/15/17  Yes [provider]  cholecalciferol (VITAMIN D) 1000 units tablet Take 2,000 Units by mouth daily. Gummies   Yes [provider]  hydrochlorothiazide (HYDRODIURIL) 25 MG tablet Take 25 mg by mouth every morning. 07/31/15  Yes [provider]  HYDROcodone-acetaminophen (NORCO/VICODIN) 5-325 MG tablet Take 1 tablet by mouth every 6 (six) hours as needed for moderate pain.   Yes [provider]  labetalol (NORMODYNE) 200 MG tablet Take 200 mg by mouth 2 (two) times daily.     Yes [provider]  metFORMIN (GLUCOPHAGE) 1000 MG tablet Take 1,000 mg by mouth 2 (two) times daily with a meal.   Yes [provider]  methocarbamol (ROBAXIN) 500 MG tablet Take 1 tablet (500 mg total) by mouth 2 (two) times daily. 06/24/17  Yes Coralyn Mark, NP  naproxen (NAPROSYN) 500 MG tablet  08/04/17  Yes [provider]  nystatin-triamcinolone ointment (MYCOLOG) Apply 1 application topically every other day.  07/31/15  Yes [provider]  UNABLE TO FIND ADULT INCONTINENCE UNDERWEAR X 06/15/17  Yes [provider]  vitamin E 400 UNIT capsule  06/15/17  Yes [provider]  cyclobenzaprine (FLEXERIL) 5 MG tablet Take 1 tablet (5 mg total) by mouth 3 (three) times daily as needed for muscle spasms. 10/24/17   Regino Fournet C, PA-C  diclofenac (VOLTAREN) 50 MG EC tablet Take 1 tablet (50 mg total) by mouth 2 (two) times daily. 10/24/17   Jagger Demonte, Junius Creamer,  PA-C    Family History History reviewed. No pertinent family history.  Social History Social History   Tobacco Use  . Smoking status: Current Some Day Smoker    Types: Cigarettes  . Smokeless tobacco: Never Used  Substance Use Topics  . Alcohol use: No  . Drug use: No     Allergies   Hydrocodone   Review of Systems Review of Systems  Constitutional: Negative for activity change, chills, diaphoresis and fatigue.  HENT: Negative for tinnitus and trouble swallowing.   Eyes: Negative for photophobia and visual disturbance.  Respiratory: Negative for cough, chest tightness and shortness of breath.   Cardiovascular: Negative for chest pain and leg swelling.  Gastrointestinal: Negative for abdominal pain, blood in stool, nausea and vomiting.  Genitourinary: Negative for decreased urine volume and difficulty urinating.  Musculoskeletal: Positive for arthralgias, back pain and myalgias. Negative for gait problem, neck pain and neck stiffness.  Skin: Negative for color change and wound.  Neurological: Negative for dizziness, weakness, light-headedness, numbness and headaches.     Physical Exam Triage Vital Signs ED Triage Vitals  Enc Vitals Group     BP 10/24/17 1317 (!) 145/81     Pulse Rate 10/24/17 1317 71     Resp --      Temp 10/24/17 1317 98.2 F (36.8 C)     Temp Source 10/24/17 1317 Oral     SpO2 10/24/17 1317 98 %     Weight --      Height --      Head Circumference --      Peak Flow --      Pain Score 10/24/17 1318 10     Pain Loc --      Pain Edu? --      Excl. in [place]? --    No data found.  Updated Vital Signs BP (!) 145/81 (BP Location: Left Wrist)   Pulse 71   Temp 98.2 F (36.8 C) (Oral)   SpO2 98%   Visual Acuity Right Eye Distance:   Left Eye Distance:   Bilateral Distance:    Right Eye Near:   Left Eye Near:    Bilateral Near:     Physical Exam  Constitutional: She appears well-developed and well-nourished. No distress.  HENT:   Head: Normocephalic and atraumatic.  Eyes: Pupils are equal, round, and reactive to light. Conjunctivae and EOM are normal.  Neck: Neck supple.  Nontender to palpation of cervical spine midline, full active range of motion of neck, nontender to palpation trapezius and SCM musculature bilaterally  Cardiovascular: Normal rate and regular rhythm.  No murmur heard. Pulmonary/Chest: Effort normal and breath sounds normal. No respiratory distress.  Breathing comfortably at rest, CTABL, no wheezing, rales or other adventitious sounds auscultated  Abdominal: Soft. There is no tenderness.  Musculoskeletal: She exhibits no edema.  Nontender to palpation of thoracic and lumbar  spine midline, pain not reproducible with palpation of paraspinal and lateral musculature of back  Full active range of motion of both shoulders, strength 5/5 and equal bilaterally, no specific motion triggering pain.  Neurological: She is alert.  Skin: Skin is warm and dry.  Psychiatric: She has a normal mood and affect.  Nursing note and vitals reviewed.    UC Treatments / Results  Labs (all labs ordered are listed, but only abnormal results are displayed) Labs Reviewed - No data to display  EKG None  Radiology No results found.  Procedures Procedures (including critical care time)  Medications Ordered in UC Medications  ketorolac (TORADOL) 30 MG/ML injection 30 mg (has no administration in time range)    Initial Impression / Assessment and Plan / UC Course  I have reviewed the triage vital signs and the nursing notes.  Pertinent labs & imaging results that were available during my care of the patient were reviewed by me and considered in my medical decision making (see chart for details).    Patient likely with muscular skeletal pain versus inflammatory pain from arthritic changes.  Will treat with anti-inflammatories muscle relaxer.  Will provide Toradol in clinic prior to discharge, will trial  diclofenac as alternative to ibuprofen.  Flexeril at bedtime as alternative to Robaxin.  Ice and heat.  No neuro deficits.  Discussed sedation regarding Flexeril and advised not to mix the 2 muscle relaxers as well as do not use when needing to drive.  Follow-up with PCP/orthopedics if symptoms persisting.  Pain associated with movement, does not seem cardiac in nature.  No chest pain or shortness of breath.Discussed strict return precautions. Patient verbalized understanding and is agreeable with plan.  Final Clinical Impressions(s) / UC Diagnoses   Final diagnoses:  Right arm pain  Acute bilateral thoracic back pain     Discharge Instructions     We gave you a shot of Toradol today in clinic, this should begin working in approximately 30 to 40 minutes Please take diclofenac twice daily with food on your stomach; please take this consistently over the next week-do not add an ibuprofen with taking this  Please try Flexeril as an alternative to your Robaxin, mainly use when you will be at home or at bedtime as this may cause drowsiness.  Do not drive after taking.  Do not take with the Robaxin.  You may try alternating ice and heat on to your back to help with inflammation and loosen any muscles that may be tight  Please continue to monitor your symptoms and follow-up if developing worsening pain, weakness, difficulty moving, persistent symptoms.   ED Prescriptions    Medication Sig Dispense Auth. Provider   cyclobenzaprine (FLEXERIL) 5 MG tablet Take 1 tablet (5 mg total) by mouth 3 (three) times daily as needed for muscle spasms. 21 tablet Beva Remund C, PA-C   diclofenac (VOLTAREN) 50 MG EC tablet Take 1 tablet (50 mg total) by mouth 2 (two) times daily. 20 tablet Shalicia Craghead, [place] C, PA-C     Controlled Substance Prescriptions [place] Controlled Substance Registry consulted? Not Applicable   Lew Dawes, PA-C 10/24/17 1403    Lew Dawes, [place] 10/24/17 1403

## 2017-10-24 NOTE — Discharge Instructions (Signed)
We gave you a shot of Toradol today in clinic, this should begin working in approximately 30 to 40 minutes Please take diclofenac twice daily with food on your stomach; please take this consistently over the next week-do not add an ibuprofen with taking this  Please try Flexeril as an alternative to your Robaxin, mainly use when you will be at home or at bedtime as this may cause drowsiness.  Do not drive after taking.  Do not take with the Robaxin.  You may try alternating ice and heat on to your back to help with inflammation and loosen any muscles that may be tight  Please continue to monitor your symptoms and follow-up if developing worsening pain, weakness, difficulty moving, persistent symptoms.

## 2018-03-07 ENCOUNTER — Ambulatory Visit (INDEPENDENT_AMBULATORY_CARE_PROVIDER_SITE_OTHER): Payer: Medicare HMO | Admitting: Podiatry

## 2018-03-07 ENCOUNTER — Encounter: Payer: Self-pay | Admitting: Podiatry

## 2018-03-07 DIAGNOSIS — B351 Tinea unguium: Secondary | ICD-10-CM

## 2018-03-07 DIAGNOSIS — Q828 Other specified congenital malformations of skin: Secondary | ICD-10-CM | POA: Diagnosis not present

## 2018-03-07 DIAGNOSIS — E119 Type 2 diabetes mellitus without complications: Secondary | ICD-10-CM

## 2018-03-07 DIAGNOSIS — M79675 Pain in left toe(s): Secondary | ICD-10-CM | POA: Diagnosis not present

## 2018-03-07 NOTE — Progress Notes (Signed)
Patient ID: Linda Chase, female   DOB: 07/12/1955, 62 y.o.   MRN: [id number] Complaint:  Visit Type: Patient returns to my office for continued preventative foot care services. Complaint: Patient states" my nails have grown long and thick and become painful to walk and wear shoes" Patient has been diagnosed with DM with neuropathy.. She  presents for preventative foot care services. No changes to ROS  Podiatric Exam: Vascular: dorsalis pedis and posterior tibial pulses are palpable bilateral. Capillary return is immediate. Temperature gradient is WNL. Skin turgor WNL  Sensorium: Normal Semmes Weinstein monofilament test. Normal tactile sensation bilaterally. Nail Exam: Pt has thick disfigured discolored nails with subungual debris noted bilateral entire nail hallux through fifth toenails Ulcer Exam: There is no evidence of ulcer or pre-ulcerative changes or infection. Orthopedic Exam: Muscle tone and strength are WNL. No limitations in general ROM. No crepitus or effusions noted. Foot type and digits show no abnormalities. Bony prominences are unremarkable. Skin:  Porokeratosis sub 5th B/L. No infection or ulcers.  Diffuse plantar tyloma feet  B/L  Diagnosis:  Tinea unguium, Pain in right toe, pain in left toes.  Debride porokeratosis sub 5th B/L..  Treatment & Plan Procedures and Treatment: Consent by patient was obtained for treatment procedures. The patient understood the discussion of treatment and procedures well. All questions were answered thoroughly reviewed. Debridement of mycotic and hypertrophic toenails, 1 through 5 bilateral and clearing of subungual debris. No ulceration, no infection noted. Debride porokeratosis Return Visit-Office Procedure: Patient instructed to return to the office for a follow up visit 3 months. for continued evaluation and treatment.  Linda Chase DPM 

## 2018-10-10 ENCOUNTER — Encounter: Payer: Self-pay | Admitting: Gynecology

## 2018-11-22 ENCOUNTER — Encounter: Payer: Self-pay | Admitting: Podiatry

## 2018-11-22 ENCOUNTER — Other Ambulatory Visit: Payer: Self-pay

## 2018-11-22 ENCOUNTER — Ambulatory Visit (INDEPENDENT_AMBULATORY_CARE_PROVIDER_SITE_OTHER): Payer: Medicare HMO | Admitting: Podiatry

## 2018-11-22 DIAGNOSIS — E119 Type 2 diabetes mellitus without complications: Secondary | ICD-10-CM | POA: Diagnosis not present

## 2018-11-22 DIAGNOSIS — Q828 Other specified congenital malformations of skin: Secondary | ICD-10-CM | POA: Diagnosis not present

## 2018-11-22 DIAGNOSIS — B351 Tinea unguium: Secondary | ICD-10-CM

## 2018-11-22 DIAGNOSIS — M79675 Pain in left toe(s): Secondary | ICD-10-CM

## 2018-11-22 NOTE — Progress Notes (Signed)
Patient ID: Linda Chase, female   DOB: 1955/09/30, 63 y.o.   MRN: [id number] Complaint:  Visit Type: Patient returns to my office for continued preventative foot care services. Complaint: Patient states" my nails have grown long and thick and become painful to walk and wear shoes" Patient has been diagnosed with DM with neuropathy.. She  presents for preventative foot care services. No changes to ROS  Podiatric Exam: Vascular: dorsalis pedis and posterior tibial pulses are palpable bilateral. Capillary return is immediate. Temperature gradient is WNL. Skin turgor WNL  Sensorium: Normal Semmes Weinstein monofilament test. Normal tactile sensation bilaterally. Nail Exam: Pt has thick disfigured discolored nails with subungual debris noted bilateral entire nail hallux through fifth toenails Ulcer Exam: There is no evidence of ulcer or pre-ulcerative changes or infection. Orthopedic Exam: Muscle tone and strength are WNL. No limitations in general ROM. No crepitus or effusions noted. Foot type and digits show no abnormalities. Bony prominences are unremarkable. Skin:  Porokeratosis sub 5th B/L. No infection or ulcers.  Diffuse plantar tyloma feet  B/L  Diagnosis:  Tinea unguium, Pain in right toe, pain in left toes.  Debride porokeratosis sub 5th B/L..  Treatment & Plan Procedures and Treatment: Consent by patient was obtained for treatment procedures. The patient understood the discussion of treatment and procedures well. All questions were answered thoroughly reviewed. Debridement of mycotic and hypertrophic toenails, 1 through 5 bilateral and clearing of subungual debris. No ulceration, no infection noted. Debride porokeratosis Return Visit-Office Procedure: Patient instructed to return to the office for a follow up visit 3 months. for continued evaluation and treatment.  Gardiner Barefoot DPM

## 2018-12-19 ENCOUNTER — Ambulatory Visit: Payer: Medicare HMO | Admitting: Podiatry

## 2019-01-22 ENCOUNTER — Other Ambulatory Visit: Payer: Self-pay | Admitting: Internal Medicine

## 2019-01-22 DIAGNOSIS — Z1231 Encounter for screening mammogram for malignant neoplasm of breast: Secondary | ICD-10-CM

## 2019-01-24 ENCOUNTER — Other Ambulatory Visit: Payer: Self-pay

## 2019-01-24 ENCOUNTER — Ambulatory Visit
Admission: RE | Admit: 2019-01-24 | Discharge: 2019-01-24 | Disposition: A | Discharge status: Home / Self Care | Payer: Medicare HMO | Source: Ambulatory Visit | Attending: Internal Medicine | Admitting: Internal Medicine

## 2019-01-24 DIAGNOSIS — Z1231 Encounter for screening mammogram for malignant neoplasm of breast: Secondary | ICD-10-CM

## 2019-02-21 ENCOUNTER — Other Ambulatory Visit: Payer: Self-pay

## 2019-02-21 ENCOUNTER — Ambulatory Visit: Payer: Medicare HMO | Admitting: Podiatry

## 2019-02-21 ENCOUNTER — Encounter: Payer: Self-pay | Admitting: Podiatry

## 2019-02-21 DIAGNOSIS — E119 Type 2 diabetes mellitus without complications: Secondary | ICD-10-CM | POA: Diagnosis not present

## 2019-02-21 DIAGNOSIS — B351 Tinea unguium: Secondary | ICD-10-CM | POA: Diagnosis not present

## 2019-02-21 DIAGNOSIS — M79675 Pain in left toe(s): Secondary | ICD-10-CM | POA: Diagnosis not present

## 2019-02-21 DIAGNOSIS — Q828 Other specified congenital malformations of skin: Secondary | ICD-10-CM | POA: Diagnosis not present

## 2019-02-21 NOTE — Progress Notes (Signed)
Patient ID: Linda Chase, female   DOB: 08/16/1955, 63 y.o.   MRN: [id number] Complaint:  Visit Type: Patient returns to my office for continued preventative foot care services. Complaint: Patient states" my nails have grown long and thick and become painful to walk and wear shoes" Patient has been diagnosed with DM with neuropathy.. She  presents for preventative foot care services. No changes to ROS  Podiatric Exam: Vascular: dorsalis pedis and posterior tibial pulses are palpable bilateral. Capillary return is immediate. Temperature gradient is WNL. Skin turgor WNL  Sensorium: Normal Semmes Weinstein monofilament test. Normal tactile sensation bilaterally. Nail Exam: Pt has thick disfigured discolored nails with subungual debris noted bilateral entire nail hallux through fifth toenails Ulcer Exam: There is no evidence of ulcer or pre-ulcerative changes or infection. Orthopedic Exam: Muscle tone and strength are WNL. No limitations in general ROM. No crepitus or effusions noted. Foot type and digits show no abnormalities. Bony prominences are unremarkable. Skin:  Porokeratosis sub 5th B/L. No infection or ulcers.  Diffuse plantar tyloma feet  B/L  Diagnosis:  Tinea unguium, Pain in right toe, pain in left toes.  Debride porokeratosis sub 5th B/L..  Treatment & Plan Procedures and Treatment: Consent by patient was obtained for treatment procedures. The patient understood the discussion of treatment and procedures well. All questions were answered thoroughly reviewed. Debridement of mycotic and hypertrophic toenails, 1 through 5 bilateral and clearing of subungual debris. No ulceration, no infection noted. Debride porokeratosis Return Visit-Office Procedure: Patient instructed to return to the office for a follow up visit 3 months. for continued evaluation and treatment.  Lydon Vansickle DPM 

## 2019-06-14 ENCOUNTER — Ambulatory Visit (HOSPITAL_COMMUNITY)
Admission: EM | Admit: 2019-06-14 | Discharge: 2019-06-14 | Disposition: A | Discharge status: Home / Self Care | Payer: Medicare HMO | Attending: Family Medicine | Admitting: Family Medicine

## 2019-06-14 ENCOUNTER — Encounter (HOSPITAL_COMMUNITY): Payer: Self-pay | Admitting: Emergency Medicine

## 2019-06-14 ENCOUNTER — Other Ambulatory Visit: Payer: Self-pay

## 2019-06-14 DIAGNOSIS — L301 Dyshidrosis [pompholyx]: Secondary | ICD-10-CM | POA: Diagnosis not present

## 2019-06-14 MED ORDER — TRIAMCINOLONE ACETONIDE 0.1 % EX CREA: 1.0000 "application " | TOPICAL_CREAM | Freq: Two times a day (BID) | CUTANEOUS | 0 refills | Status: DC

## 2019-06-14 NOTE — ED Triage Notes (Signed)
Pt cv/o bilateral rash x 1 month. She states it has been very itchy and there are some open blisters, denies any bleeding.

## 2019-06-14 NOTE — Discharge Instructions (Signed)
This is a form of eczema.  Use the cream as prescribed.  Make sure you are keeping the hands moisturized Follow up as needed for continued or worsening symptoms

## 2019-06-15 NOTE — ED Provider Notes (Signed)
[facility]    CSN: [id number] Arrival date & time: 06/14/19  1258      History   Chief Complaint Chief Complaint  Patient presents with  . Rash    HPI Linda Chase is a 64 y.o. female.   Patient is a 64 year old female presents today with a rash.  Rash is located to bilateral hands.  This is been constant, somewhat improving over the past month.  The rash she reports dermatitis small blisters and now she is dry and cracking.  Mildly itchy at times.  She does pain wash dishes and uses some products to clean but reports she uses gloves.  Denies any current pain.  Was previously prescribed clotrimazole cream which she reports did not change the rash at all.  She tried some hydrocortisone cream over-the-counter which did not help.  Denies any fever, joint pain. Denies any recent changes in lotions, detergents, foods or other possible irritants. No recent travel. Nobody else at home has the rash. Patient has been outside but denies any contact with plants or insects. No new foods or medications.   ROS per HPI      Past Medical History:  Diagnosis Date  . Arthritis   . Cerebral aneurysm    x2  . Depression   . Diabetes mellitus   . Hypertension     Patient Active Problem List   Diagnosis Date Noted  . Influenza A (H1N1) 02/03/2012  . COPD exacerbation (HCC) 02/02/2012  . Diabetes mellitus (HCC) 02/01/2012  . Acute respiratory failure with hypercapnia (HCC) 02/01/2012  . SIRS (systemic inflammatory response syndrome) (HCC) 02/01/2012  . Hypertension 02/01/2012  . Depression 02/01/2012  . Obesity 02/01/2012    Past Surgical History:  Procedure Laterality Date  . BRAIN SURGERY     double aneurysm    OB History   No obstetric history on file.      Home Medications    Prior to Admission medications   Medication Sig Start Date End Date Taking? Authorizing Provider  amLODipine (NORVASC) 5 MG tablet Take 1 tablet (5 mg total) by mouth  daily. Patient taking differently: Take 5 mg by mouth every evening.  02/05/12  Yes TatOnalee Hua, MD  Calcium-Phosphorus-Vitamin D (CALCIUM/VITAMIN D3/ADULT GUMMY PO) GUMMY VITAMIN D 2000 06/15/17  Yes [provider]  cholecalciferol (VITAMIN D) 1000 units tablet Take 2,000 Units by mouth daily. Gummies   Yes [provider]  hydrochlorothiazide (HYDRODIURIL) 25 MG tablet Take 25 mg by mouth every morning. 07/31/15  Yes [provider]  HYDROcodone-acetaminophen (NORCO/VICODIN) 5-325 MG tablet Take 1 tablet by mouth every 6 (six) hours as needed for moderate pain.   Yes [provider]  ibuprofen (ADVIL,MOTRIN) 800 MG tablet Take 800 mg by mouth 3 (three) times daily. 09/27/17  Yes [provider]  labetalol (NORMODYNE) 200 MG tablet Take 200 mg by mouth 2 (two) times daily.     Yes [provider]  metFORMIN (GLUCOPHAGE) 1000 MG tablet Take 1,000 mg by mouth 2 (two) times daily with a meal.   Yes [provider]  potassium chloride (KLOR-CON) 10 MEQ tablet  11/28/18  Yes [provider]  UNABLE TO FIND ADULT INCONTINENCE UNDERWEAR X 06/15/17  Yes [provider]  vitamin E 400 UNIT capsule  06/15/17  Yes [provider]  cyclobenzaprine (FLEXERIL) 5 MG tablet Take 1 tablet (5 mg total) by mouth 3 (three) times daily as needed for muscle spasms. 10/24/17   Patterson Hammersmith  C, PA-C  diclofenac (VOLTAREN) 50 MG EC tablet Take 1 tablet (50 mg total) by mouth 2 (two) times daily. 10/24/17   Wieters, Hallie C, PA-C  doxycycline (VIBRAMYCIN) 100 MG capsule TAKE 1 CAPSULE BY MOUTH TWICE DAILY FOR 7 DAYS 09/29/17   [provider]  methocarbamol (ROBAXIN) 500 MG tablet Take 1 tablet (500 mg total) by mouth 2 (two) times daily. 06/24/17   Marney Setting, NP  naproxen (NAPROSYN) 500 MG tablet  08/04/17   [provider]  nystatin-triamcinolone ointment (MYCOLOG) Apply 1 application topically every other day.   07/31/15   [provider]  triamcinolone cream (KENALOG) 0.1 % Apply 1 application topically 2 (two) times daily. 06/14/19   Orvan July, NP    Family History Family History  Family history unknown: Yes    Social History Social History   Tobacco Use  . Smoking status: Current Some Day Smoker    Types: Cigarettes  . Smokeless tobacco: Never Used  Substance Use Topics  . Alcohol use: No  . Drug use: No     Allergies   Hydrocodone   Review of Systems Review of Systems   Physical Exam Triage Vital Signs ED Triage Vitals  Enc Vitals Group     BP --      Pulse Rate 06/14/19 1315 77     Resp 06/14/19 1315 20     Temp 06/14/19 1315 98.2 F (36.8 C)     Temp Source 06/14/19 1315 Oral     SpO2 06/14/19 1315 96 %     Weight --      Height --      Head Circumference --      Peak Flow --      Pain Score 06/14/19 1310 4     Pain Loc --      Pain Edu? --      Excl. in [place]? --    No data found.  Updated Vital Signs Pulse 77   Temp 98.2 F (36.8 C) (Oral)   Resp 20   SpO2 96%   Visual Acuity Right Eye Distance:   Left Eye Distance:   Bilateral Distance:    Right Eye Near:   Left Eye Near:    Bilateral Near:     Physical Exam Vitals and nursing note reviewed.  Constitutional:      General: She is not in acute distress.    Appearance: Normal appearance. She is not ill-appearing, toxic-appearing or diaphoretic.  HENT:     Head: Normocephalic.     Nose: Nose normal.  Eyes:     Conjunctiva/sclera: Conjunctivae normal.  Pulmonary:     Effort: Pulmonary effort is normal.  Musculoskeletal:        General: Normal range of motion.     Cervical back: Normal range of motion.  Skin:    General: Skin is warm and dry.     Findings: No rash.     Comments: Dry, scaly rash to bilateral hands worse to left hand on the palmar aspect of the middle and index finger Mild hyperpigmentation No redness, drainage or tenderness.   Normal range of motion   Neurological:     Mental Status: She is alert.  Psychiatric:        Mood and Affect: Mood normal.      UC Treatments / Results  Labs (all labs ordered are listed, but only abnormal results are displayed) Labs Reviewed - No data to display  EKG  Radiology No results found.  Procedures Procedures (including critical care time)  Medications Ordered in UC Medications - No data to display  Initial Impression / Assessment and Plan / UC Course  I have reviewed the triage vital signs and the nursing notes.  Pertinent labs & imaging results that were available during my care of the patient were reviewed by me and considered in my medical decision making (see chart for details).     Dyshidrotic eczema Rash consistent with this. Will prescribe triamcinolone cream Recommended keep hands hydrated especially before using gloves and avoid irritants to the hands Follow up as needed for continued or worsening symptoms  Final Clinical Impressions(s) / UC Diagnoses   Final diagnoses:  Dyshidrotic eczema     Discharge Instructions     This is a form of eczema.  Use the cream as prescribed.  Make sure you are keeping the hands moisturized Follow up as needed for continued or worsening symptoms     ED Prescriptions    Medication Sig Dispense Auth. Provider   triamcinolone cream (KENALOG) 0.1 % Apply 1 application topically 2 (two) times daily. 30 g Dahlia Byes A, NP     PDMP not reviewed this encounter.   Dahlia Byes A, NP 06/15/19 1030

## 2019-10-30 ENCOUNTER — Other Ambulatory Visit: Payer: Self-pay

## 2019-10-30 ENCOUNTER — Ambulatory Visit: Payer: Medicare HMO | Admitting: Podiatry

## 2019-10-30 ENCOUNTER — Encounter: Payer: Self-pay | Admitting: Podiatry

## 2019-10-30 DIAGNOSIS — B351 Tinea unguium: Secondary | ICD-10-CM

## 2019-10-30 DIAGNOSIS — E119 Type 2 diabetes mellitus without complications: Secondary | ICD-10-CM

## 2019-10-30 DIAGNOSIS — Q828 Other specified congenital malformations of skin: Secondary | ICD-10-CM | POA: Diagnosis not present

## 2019-10-30 DIAGNOSIS — M79675 Pain in left toe(s): Secondary | ICD-10-CM

## 2019-10-30 NOTE — Progress Notes (Signed)
Patient ID: Linda Chase, female   DOB: 16-May-1955, 64 y.o.   MRN: [id number] Complaint:  Visit Type: Patient returns to my office for continued preventative foot care services. Complaint: Patient states" my nails have grown long and thick and become painful to walk and wear shoes" Patient has been diagnosed with DM with neuropathy.. She  presents for preventative foot care services. No changes to ROS  Podiatric Exam: Vascular: dorsalis pedis and posterior tibial pulses are palpable bilateral. Capillary return is immediate. Temperature gradient is WNL. Skin turgor WNL  Sensorium: Normal Semmes Weinstein monofilament test. Normal tactile sensation bilaterally. Nail Exam: Pt has thick disfigured discolored nails with subungual debris noted bilateral entire nail hallux through fifth toenails Ulcer Exam: There is no evidence of ulcer or pre-ulcerative changes or infection. Orthopedic Exam: Muscle tone and strength are WNL. No limitations in general ROM. No crepitus or effusions noted. Foot type and digits show no abnormalities. Bony prominences are unremarkable. Skin:  Porokeratosis sub 5th B/L. No infection or ulcers.  Diffuse plantar tyloma feet  B/L  Diagnosis:  Tinea unguium, Pain in right toe, pain in left toes.  Debride porokeratosis sub 5th B/L..  Treatment & Plan Procedures and Treatment: Consent by patient was obtained for treatment procedures. The patient understood the discussion of treatment and procedures well. All questions were answered thoroughly reviewed. Debridement of mycotic and hypertrophic toenails, 1 through 5 bilateral and clearing of subungual debris. No ulceration, no infection noted. Debride porokeratosis Return Visit-Office Procedure: Patient instructed to return to the office for a follow up visit 3 months. for continued evaluation and treatment.  Helane Gunther DPM

## 2019-11-21 ENCOUNTER — Ambulatory Visit: Payer: Medicare HMO | Admitting: Podiatry

## 2020-02-05 ENCOUNTER — Other Ambulatory Visit: Payer: Self-pay

## 2020-02-05 ENCOUNTER — Ambulatory Visit: Payer: Medicare HMO | Admitting: Podiatry

## 2020-02-05 ENCOUNTER — Encounter: Payer: Self-pay | Admitting: Podiatry

## 2020-02-05 DIAGNOSIS — Q828 Other specified congenital malformations of skin: Secondary | ICD-10-CM

## 2020-02-05 DIAGNOSIS — M79675 Pain in left toe(s): Secondary | ICD-10-CM

## 2020-02-05 DIAGNOSIS — B351 Tinea unguium: Secondary | ICD-10-CM

## 2020-02-05 DIAGNOSIS — E119 Type 2 diabetes mellitus without complications: Secondary | ICD-10-CM

## 2020-02-05 NOTE — Progress Notes (Addendum)
Patient ID: Linda Chase, female   DOB: 1955/06/22, 65 y.o.   MRN: [id number] Complaint:  Visit Type: Patient returns to my office for continued preventative foot care services. Complaint: Patient states" my nails have grown long and thick and become painful to walk and wear shoes" Patient has been diagnosed with DM with neuropathy.. She  presents for preventative foot care services. No changes to ROS  Podiatric Exam: Vascular: dorsalis pedis and posterior tibial pulses are  weakly palpable bilateral. Capillary return is immediate. Temperature gradient is WNL. Skin turgor WNL Absent hair digits  B/L. Sensorium: Normal Semmes Weinstein monofilament test. Normal tactile sensation bilaterally. Nail Exam: Pt has thick disfigured discolored nails with subungual debris noted bilateral entire nail hallux through fifth toenails Ulcer Exam: There is no evidence of ulcer or pre-ulcerative changes or infection. Orthopedic Exam: Muscle tone and strength are WNL. No limitations in general ROM. No crepitus or effusions noted. Foot type and digits show no abnormalities. Bony prominences are unremarkable. Skin:  Porokeratosis sub 5th B/L. No infection or ulcers.  Diffuse plantar tyloma feet  B/L.  Porokeratosis sub 2 right foot.  Diagnosis:  Tinea unguium, Pain in right toe, pain in left toes.  Debride porokeratosis sub 5th B/L..  Treatment & Plan Procedures and Treatment: Consent by patient was obtained for treatment procedures. The patient understood the discussion of treatment and procedures well. All questions were answered thoroughly reviewed. Debridement of mycotic and hypertrophic toenails, 1 through 5 bilateral and clearing of subungual debris. No ulceration, no infection noted. Debride porokeratosis with # 15 blade and dremel tool. Return Visit-Office Procedure: Patient instructed to return to the office for a follow up visit 3 months. for continued evaluation and treatment.  Helane Gunther DPM

## 2020-05-06 ENCOUNTER — Encounter: Payer: Self-pay | Admitting: Podiatry

## 2020-05-06 ENCOUNTER — Other Ambulatory Visit: Payer: Self-pay

## 2020-05-06 ENCOUNTER — Ambulatory Visit: Payer: Medicare HMO | Admitting: Podiatry

## 2020-05-06 DIAGNOSIS — Q828 Other specified congenital malformations of skin: Secondary | ICD-10-CM | POA: Diagnosis not present

## 2020-05-06 DIAGNOSIS — B351 Tinea unguium: Secondary | ICD-10-CM

## 2020-05-06 DIAGNOSIS — E119 Type 2 diabetes mellitus without complications: Secondary | ICD-10-CM | POA: Diagnosis not present

## 2020-05-06 DIAGNOSIS — M79675 Pain in left toe(s): Secondary | ICD-10-CM

## 2020-05-06 NOTE — Progress Notes (Signed)
Patient ID: Linda Chase, female   DOB: [birth year], 65 y.o.   MRN: [id number] Complaint:  Visit Type: Patient returns to my office for continued preventative foot care services. Complaint: Patient states" my nails have grown long and thick and become painful to walk and wear shoes" Patient has been diagnosed with DM with neuropathy.. She  presents for preventative foot care services. No changes to ROS  Podiatric Exam: Vascular: dorsalis pedis and posterior tibial pulses are  weakly palpable bilateral. Capillary return is immediate. Temperature gradient is WNL. Skin turgor WNL Absent hair digits  B/L. Sensorium: Normal Semmes Weinstein monofilament test. Normal tactile sensation bilaterally. Nail Exam: Pt has thick disfigured discolored nails with subungual debris noted bilateral entire nail hallux through fifth toenails Ulcer Exam: There is no evidence of ulcer or pre-ulcerative changes or infection. Orthopedic Exam: Muscle tone and strength are WNL. No limitations in general ROM. No crepitus or effusions noted. Foot type and digits show no abnormalities. Bony prominences are unremarkable. Skin:  Porokeratosis sub 5th B/L. No infection or ulcers.    Diagnosis:  Tinea unguium, Pain in right toe, pain in left toes.  Debride porokeratosis sub 5th B/L..  Treatment & Plan Procedures and Treatment: Consent by patient was obtained for treatment procedures. The patient understood the discussion of treatment and procedures well. All questions were answered thoroughly reviewed. Debridement of mycotic and hypertrophic toenails, 1 through 5 bilateral and clearing of subungual debris. No ulceration, no infection noted. Debride porokeratosis with # 15 blade and dremel tool. Return Visit-Office Procedure: Patient instructed to return to the office for a follow up visit 3 months. for continued evaluation and treatment.  Helane Gunther DPM

## 2020-05-22 ENCOUNTER — Encounter (HOSPITAL_COMMUNITY): Payer: Self-pay | Admitting: Emergency Medicine

## 2020-05-22 ENCOUNTER — Ambulatory Visit (HOSPITAL_COMMUNITY)
Admission: EM | Admit: 2020-05-22 | Discharge: 2020-05-22 | Disposition: A | Discharge status: Home / Self Care | Payer: Medicare HMO | Attending: Emergency Medicine | Admitting: Emergency Medicine

## 2020-05-22 ENCOUNTER — Other Ambulatory Visit: Payer: Self-pay

## 2020-05-22 DIAGNOSIS — M79602 Pain in left arm: Secondary | ICD-10-CM

## 2020-05-22 MED ORDER — KETOROLAC TROMETHAMINE 30 MG/ML IJ SOLN
30.0000 mg | Freq: Once | INTRAMUSCULAR | Status: AC
  Administered 2020-05-22: 30 mg via INTRAMUSCULAR

## 2020-05-22 MED ORDER — METHOCARBAMOL 500 MG PO TABS: 500.0000 mg | ORAL_TABLET | Freq: Two times a day (BID) | ORAL | 0 refills | Status: DC

## 2020-05-22 MED ORDER — NAPROXEN 500 MG PO TABS: 500.0000 mg | ORAL_TABLET | Freq: Two times a day (BID) | ORAL | 0 refills | Status: DC

## 2020-05-22 MED ORDER — KETOROLAC TROMETHAMINE 30 MG/ML IJ SOLN
INTRAMUSCULAR | Status: AC
  Filled 2020-05-22: qty 1

## 2020-05-22 NOTE — ED Provider Notes (Signed)
[facility]    CSN: [id number] Arrival date & time: 05/22/20  1159      History   Chief Complaint Chief Complaint  Patient presents with  . Arm Pain    HPI Linda Chase is a 65 y.o. female.   Patient presents with left shoulder pain radiating down the left upper arm beginning Monday morning and after having cold chill and body Sunday evening.  Pain is described as throbbing.  Denies numbness and tingling.  Range of motion intact but elicits pain.  Patient has attempted use of Benadryl ibuprofen, Flexeril, naproxen ,heating pad with no relief.   Past Medical History:  Diagnosis Date  . Arthritis   . Cerebral aneurysm    x2  . Depression   . Diabetes mellitus   . Hypertension     Patient Active Problem List   Diagnosis Date Noted  . Influenza A (H1N1) 02/03/2012  . COPD exacerbation (HCC) 02/02/2012  . Diabetes mellitus (HCC) 02/01/2012  . Acute respiratory failure with hypercapnia (HCC) 02/01/2012  . SIRS (systemic inflammatory response syndrome) (HCC) 02/01/2012  . Hypertension 02/01/2012  . Depression 02/01/2012  . Obesity 02/01/2012    Past Surgical History:  Procedure Laterality Date  . BRAIN SURGERY     double aneurysm    OB History   No obstetric history on file.      Home Medications    Prior to Admission medications   Medication Sig Start Date End Date Taking? Authorizing Provider  naproxen (NAPROSYN) 500 MG tablet Take 1 tablet (500 mg total) by mouth 2 (two) times daily. 05/22/20  Yes Lacresia Darwish R, NP  amLODipine (NORVASC) 5 MG tablet Take 1 tablet (5 mg total) by mouth daily. Patient taking differently: Take 5 mg by mouth every evening.  02/05/12   Catarina Hartshorn, MD  Calcium-Phosphorus-Vitamin D (CALCIUM/VITAMIN D3/ADULT GUMMY PO) GUMMY VITAMIN D 2000 06/15/17   [provider]  cholecalciferol (VITAMIN D) 1000 units tablet Take 2,000 Units by mouth daily. Gummies    [provider]  cyclobenzaprine  (FLEXERIL) 5 MG tablet Take 1 tablet (5 mg total) by mouth 3 (three) times daily as needed for muscle spasms. 10/24/17   Wieters, Hallie C, PA-C  diclofenac (VOLTAREN) 50 MG EC tablet Take 1 tablet (50 mg total) by mouth 2 (two) times daily. 10/24/17   Wieters, Hallie C, PA-C  doxycycline (VIBRAMYCIN) 100 MG capsule TAKE 1 CAPSULE BY MOUTH TWICE DAILY FOR 7 DAYS Patient not taking: Reported on 05/22/2020 09/29/17   [provider]  hydrochlorothiazide (HYDRODIURIL) 25 MG tablet Take 25 mg by mouth every morning. 07/31/15   [provider]  HYDROcodone-acetaminophen (NORCO/VICODIN) 5-325 MG tablet Take 1 tablet by mouth every 6 (six) hours as needed for moderate pain.    [provider]  ibuprofen (ADVIL,MOTRIN) 800 MG tablet Take 800 mg by mouth 3 (three) times daily. 09/27/17   [provider]  labetalol (NORMODYNE) 200 MG tablet Take 200 mg by mouth 2 (two) times daily.      [provider]  metFORMIN (GLUCOPHAGE) 1000 MG tablet Take 1,000 mg by mouth 2 (two) times daily with a meal.    [provider]  methocarbamol (ROBAXIN) 500 MG tablet Take 1 tablet (500 mg total) by mouth 2 (two) times daily. 05/22/20   Valinda Hoar, NP  nystatin-triamcinolone ointment (MYCOLOG) Apply 1 application topically every other day.  07/31/15   [provider]  potassium chloride (KLOR-CON) 10 MEQ tablet  11/28/18   [provider]  triamcinolone cream (KENALOG) 0.1 % Apply 1 application topically 2 (two) times daily. 06/14/19   Janace Aris, NP  UNABLE TO FIND ADULT INCONTINENCE UNDERWEAR X 06/15/17   [provider]  vitamin E 400 UNIT capsule  06/15/17   [provider]    Family History Family History  Family history unknown: Yes    Social History Social History   Tobacco Use  . Smoking status: Current Some Day Smoker    Types: Cigarettes  . Smokeless tobacco: Never Used  Vaping Use  . Vaping Use: Never used   Substance Use Topics  . Alcohol use: No  . Drug use: No     Allergies   Hydrocodone   Review of Systems Review of Systems  Constitutional: Negative.   Respiratory: Negative.   Cardiovascular: Negative.   Musculoskeletal: Positive for joint swelling. Negative for arthralgias, back pain, gait problem, myalgias, neck pain and neck stiffness.  Skin: Negative.   Neurological: Negative.      Physical Exam Triage Vital Signs ED Triage Vitals  Enc Vitals Group     BP 05/22/20 1334 (!) 141/89     Pulse Rate 05/22/20 1334 71     Resp 05/22/20 1334 (!) 24     Temp 05/22/20 1334 98.6 F (37 C)     Temp Source 05/22/20 1334 Oral     SpO2 05/22/20 1334 96 %     Weight --      Height --      Head Circumference --      Peak Flow --      Pain Score 05/22/20 1330 10     Pain Loc --      Pain Edu? --      Excl. in GC? --    No data found.  Updated Vital Signs BP (!) 141/89 (BP Location: Right Arm) Comment (BP Location): regular cuff on forearm  Pulse 71   Temp 98.6 F (37 C) (Oral)   Resp (!) 24   SpO2 96%   Visual Acuity Right Eye Distance:   Left Eye Distance:   Bilateral Distance:    Right Eye Near:   Left Eye Near:    Bilateral Near:     Physical Exam Constitutional:      Appearance: Normal appearance. She is normal weight.  HENT:     Head: Normocephalic.  Eyes:     Extraocular Movements: Extraocular movements intact.  Pulmonary:     Effort: Pulmonary effort is normal.  Musculoskeletal:     Cervical back: Normal range of motion.     Comments: Tenderness is diffuse throughout the shoulder and upper arm, no point tenderness noted, no ecchymosis or swelling noted range of motion is intact 2+ brachial pulse  Skin:    General: Skin is warm and dry.  Neurological:     General: No focal deficit present.     Mental Status: She is alert and oriented to person, place, and time. Mental status is at baseline.  Psychiatric:        Mood and Affect: Mood normal.         Behavior: Behavior normal.        Thought Content: Thought content normal.        Judgment: Judgment normal.      UC Treatments / Results  Labs (all labs ordered are listed, but only abnormal results are displayed) Labs Reviewed - No data to display  EKG  Radiology No results found.  Procedures Procedures (including critical care time)  Medications Ordered in UC Medications  ketorolac (TORADOL) 30 MG/ML injection 30 mg (30 mg Intramuscular Given 05/22/20 1423)    Initial Impression / Assessment and Plan / UC Course  I have reviewed the triage vital signs and the nursing notes.  Pertinent labs & imaging results that were available during my care of the patient were reviewed by me and considered in my medical decision making (see chart for details).  Acute left arm pain  1.  Toradol 30 mg IM now 2.  Naproxen 500 mg twice daily 3.  Robaxin 500 mg twice daily as needed 4.  Heating pad 15-minute intervals 5.  Follow-up with orthopedic specialist if symptoms persist Final Clinical Impressions(s) / UC Diagnoses   Final diagnoses:  Left arm pain     Discharge Instructions     Can use naproxen twice a day for the next 5 days then as needed  Can use muscle relaxer twice a day, please be mindful that this medication may make you drowsy, therefore do not take before driving  For persistent pain, increased swelling, decreased movement you may follow-up with urgent care or you may go to the orthopedic specialist for reevaluation, information is listed below   ED Prescriptions    Medication Sig Dispense Auth. Provider   naproxen (NAPROSYN) 500 MG tablet Take 1 tablet (500 mg total) by mouth 2 (two) times daily. 30 tablet Christyan Reger R, NP   methocarbamol (ROBAXIN) 500 MG tablet Take 1 tablet (500 mg total) by mouth 2 (two) times daily. 20 tablet Valinda Hoar, NP     PDMP not reviewed this encounter.   Valinda Hoar, NP 05/22/20 1433

## 2020-05-22 NOTE — ED Triage Notes (Signed)
Patient complains about getting very cold Sunday and pain started in left side.  The next morning, pain was in left arm.  Denies chest pain.  Reports using heating pad with minimal relief.    Patient has taken benadryl and ibuprofen.  Patient reports pain is worsened with movement of left shoulder, described as throbbing

## 2020-05-22 NOTE — Discharge Instructions (Signed)
Can use naproxen twice a day for the next 5 days then as needed  Can use muscle relaxer twice a day, please be mindful that this medication may make you drowsy, therefore do not take before driving  For persistent pain, increased swelling, decreased movement you may follow-up with urgent care or you may go to the orthopedic specialist for reevaluation, information is listed below

## 2020-08-06 ENCOUNTER — Ambulatory Visit: Payer: Medicare HMO | Admitting: Podiatry

## 2020-08-20 ENCOUNTER — Ambulatory Visit: Payer: Medicare HMO | Admitting: Podiatry

## 2020-08-20 ENCOUNTER — Other Ambulatory Visit: Payer: Self-pay

## 2020-08-20 ENCOUNTER — Encounter: Payer: Self-pay | Admitting: Podiatry

## 2020-08-20 DIAGNOSIS — M79675 Pain in left toe(s): Secondary | ICD-10-CM | POA: Diagnosis not present

## 2020-08-20 DIAGNOSIS — B351 Tinea unguium: Secondary | ICD-10-CM

## 2020-08-20 DIAGNOSIS — E119 Type 2 diabetes mellitus without complications: Secondary | ICD-10-CM | POA: Diagnosis not present

## 2020-08-20 NOTE — Progress Notes (Signed)
Patient ID: Linda Chase, female   DOB: 04-10-1955, 65 y.o.   MRN: [id number] Complaint:  Visit Type: Patient returns to my office for continued preventative foot care services. Complaint: Patient states" my nails have grown long and thick and become painful to walk and wear shoes" Patient has been diagnosed with DM with neuropathy.. She  presents for preventative foot care services. No changes to ROS  Podiatric Exam: Vascular: dorsalis pedis and posterior tibial pulses are  weakly palpable bilateral. Capillary return is immediate. Temperature gradient is WNL. Skin turgor WNL Absent hair digits  B/L. Sensorium: Normal Semmes Weinstein monofilament test. Normal tactile sensation bilaterally. Nail Exam: Pt has thick disfigured discolored nails with subungual debris noted bilateral entire nail hallux through fifth toenails Ulcer Exam: There is no evidence of ulcer or pre-ulcerative changes or infection. Orthopedic Exam: Muscle tone and strength are WNL. No limitations in general ROM. No crepitus or effusions noted. Foot type and digits show no abnormalities. Bony prominences are unremarkable. Skin:  Porokeratosis sub 5th B/L. No infection or ulcers.    Diagnosis:  Tinea unguium, Pain in right toe, pain in left toes.    Treatment & Plan Procedures and Treatment: Consent by patient was obtained for treatment procedures. The patient understood the discussion of treatment and procedures well. All questions were answered thoroughly reviewed. Debridement of mycotic and hypertrophic toenails, 1 through 5 bilateral and clearing of subungual debris. No ulceration, no infection noted.  Return Visit-Office Procedure: Patient instructed to return to the office for a follow up visit 3 months. for continued evaluation and treatment.  Helane Gunther DPM

## 2020-08-21 ENCOUNTER — Other Ambulatory Visit: Payer: Self-pay | Admitting: Internal Medicine

## 2020-08-21 DIAGNOSIS — Z1231 Encounter for screening mammogram for malignant neoplasm of breast: Secondary | ICD-10-CM

## 2020-09-01 ENCOUNTER — Other Ambulatory Visit: Payer: Self-pay

## 2020-09-01 ENCOUNTER — Ambulatory Visit
Admission: RE | Admit: 2020-09-01 | Discharge: 2020-09-01 | Disposition: A | Discharge status: Home / Self Care | Payer: Medicare HMO | Source: Ambulatory Visit | Attending: Internal Medicine | Admitting: Internal Medicine

## 2020-09-01 DIAGNOSIS — Z1231 Encounter for screening mammogram for malignant neoplasm of breast: Secondary | ICD-10-CM

## 2020-11-24 ENCOUNTER — Ambulatory Visit: Payer: Medicare HMO | Admitting: Podiatry

## 2020-12-22 ENCOUNTER — Encounter: Payer: Self-pay | Admitting: Podiatry

## 2020-12-22 ENCOUNTER — Encounter (INDEPENDENT_AMBULATORY_CARE_PROVIDER_SITE_OTHER): Payer: Self-pay | Admitting: Podiatry

## 2020-12-22 DIAGNOSIS — B351 Tinea unguium: Secondary | ICD-10-CM

## 2020-12-22 DIAGNOSIS — M79675 Pain in left toe(s): Secondary | ICD-10-CM

## 2020-12-22 DIAGNOSIS — E119 Type 2 diabetes mellitus without complications: Secondary | ICD-10-CM

## 2020-12-22 NOTE — Progress Notes (Signed)
This encounter was created in error - please disregard.

## 2021-01-14 ENCOUNTER — Encounter: Payer: Self-pay | Admitting: Podiatry

## 2021-01-14 ENCOUNTER — Other Ambulatory Visit: Payer: Self-pay

## 2021-01-14 ENCOUNTER — Ambulatory Visit (INDEPENDENT_AMBULATORY_CARE_PROVIDER_SITE_OTHER): Payer: Medicare HMO | Admitting: Podiatry

## 2021-01-14 DIAGNOSIS — Q828 Other specified congenital malformations of skin: Secondary | ICD-10-CM

## 2021-01-14 DIAGNOSIS — M79675 Pain in left toe(s): Secondary | ICD-10-CM

## 2021-01-14 DIAGNOSIS — E119 Type 2 diabetes mellitus without complications: Secondary | ICD-10-CM

## 2021-01-14 DIAGNOSIS — B351 Tinea unguium: Secondary | ICD-10-CM

## 2021-01-14 NOTE — Progress Notes (Signed)
Patient ID: Linda Chase, female   DOB: 1955/05/30, 66 y.o.   MRN: [id number] Complaint:  Visit Type: Patient returns to my office for continued preventative foot care services. Complaint: Patient states" my nails have grown long and thick and become painful to walk and wear shoes" Patient has been diagnosed with DM with neuropathy.. She  presents for preventative foot care services. No changes to ROS  Podiatric Exam: Vascular: dorsalis pedis and posterior tibial pulses are  weakly palpable bilateral. Capillary return is immediate. Temperature gradient is WNL. Skin turgor WNL Absent hair digits  B/L. Sensorium: Normal Semmes Weinstein monofilament test. Normal tactile sensation bilaterally. Nail Exam: Pt has thick disfigured discolored nails with subungual debris noted bilateral entire nail hallux through fifth toenails Ulcer Exam: There is no evidence of ulcer or pre-ulcerative changes or infection. Orthopedic Exam: Muscle tone and strength are WNL. No limitations in general ROM. No crepitus or effusions noted. Foot type and digits show no abnormalities. Bony prominences are unremarkable. Skin:  Porokeratosis sub 5th B/L. No infection or ulcers.    Diagnosis:  Tinea unguium, Pain in right toe, pain in left toes.    Treatment & Plan Procedures and Treatment: Consent by patient was obtained for treatment procedures. The patient understood the discussion of treatment and procedures well. All questions were answered thoroughly reviewed. Debridement of mycotic and hypertrophic toenails, 1 through 5 bilateral and clearing of subungual debris. No ulceration, no infection noted.  Return Visit-Office Procedure: Patient instructed to return to the office for a follow up visit 3 months. for continued evaluation and treatment.  Helane Gunther DPM

## 2021-04-15 ENCOUNTER — Ambulatory Visit: Payer: Medicare HMO | Admitting: Podiatry

## 2021-05-19 ENCOUNTER — Encounter: Payer: Self-pay | Admitting: Podiatry

## 2021-05-19 ENCOUNTER — Ambulatory Visit: Payer: Medicare HMO | Admitting: Podiatry

## 2021-05-19 DIAGNOSIS — Q828 Other specified congenital malformations of skin: Secondary | ICD-10-CM

## 2021-05-19 DIAGNOSIS — M79674 Pain in right toe(s): Secondary | ICD-10-CM

## 2021-05-19 DIAGNOSIS — E119 Type 2 diabetes mellitus without complications: Secondary | ICD-10-CM | POA: Diagnosis not present

## 2021-05-19 DIAGNOSIS — M79675 Pain in left toe(s): Secondary | ICD-10-CM

## 2021-05-19 DIAGNOSIS — B351 Tinea unguium: Secondary | ICD-10-CM | POA: Diagnosis not present

## 2021-05-19 NOTE — Progress Notes (Signed)
Patient ID: Linda Chase, female   DOB: 1955/02/28, 66 y.o.   MRN: [id number] ?Complaint:  ?Visit Type: Patient returns to my office for continued preventative foot care services. Complaint: Patient states" my nails have grown long and thick and become painful to walk and wear shoes" Patient has been diagnosed with DM with neuropathy.. She  presents for preventative foot care services. No changes to ROS ? ?Podiatric Exam: ?Vascular: dorsalis pedis and posterior tibial pulses are  weakly palpable bilateral. Capillary return is immediate. Temperature gradient is WNL. Skin turgor WNL Absent hair digits  B/L. ?Sensorium: Normal Semmes Weinstein monofilament test. Normal tactile sensation bilaterally. ?Nail Exam: Pt has thick disfigured discolored nails with subungual debris noted bilateral entire nail hallux through fifth toenails ?Ulcer Exam: There is no evidence of ulcer or pre-ulcerative changes or infection. ?Orthopedic Exam: Muscle tone and strength are WNL. No limitations in general ROM. No crepitus or effusions noted. Foot type and digits show no abnormalities. Bony prominences are unremarkable. ?Skin:  Porokeratosis sub 5th B/L. No infection or ulcers.   ? ?Diagnosis:  ?Tinea unguium, Pain in right toe, pain in left toes.   ? ?Treatment & Plan ?Procedures and Treatment: Consent by patient was obtained for treatment procedures. The patient understood the discussion of treatment and procedures well. All questions were answered thoroughly reviewed. Debridement of mycotic and hypertrophic toenails, 1 through 5 bilateral and clearing of subungual debris. Debride callus with # 15 blade.No ulceration, no infection noted.  ?Return Visit-Office Procedure: Patient instructed to return to the office for a follow up visit 3 months. for continued evaluation and treatment. ? ?Helane Gunther DPM ?

## 2021-08-18 ENCOUNTER — Ambulatory Visit: Payer: Medicare HMO | Admitting: Podiatry

## 2021-09-23 ENCOUNTER — Encounter: Payer: Self-pay | Admitting: Podiatry

## 2021-09-23 ENCOUNTER — Ambulatory Visit (INDEPENDENT_AMBULATORY_CARE_PROVIDER_SITE_OTHER): Payer: Medicare HMO | Admitting: Podiatry

## 2021-09-23 DIAGNOSIS — M2142 Flat foot [pes planus] (acquired), left foot: Secondary | ICD-10-CM

## 2021-09-23 DIAGNOSIS — B351 Tinea unguium: Secondary | ICD-10-CM

## 2021-09-23 DIAGNOSIS — E119 Type 2 diabetes mellitus without complications: Secondary | ICD-10-CM | POA: Diagnosis not present

## 2021-09-23 DIAGNOSIS — M216X9 Other acquired deformities of unspecified foot: Secondary | ICD-10-CM

## 2021-09-23 DIAGNOSIS — M79675 Pain in left toe(s): Secondary | ICD-10-CM | POA: Diagnosis not present

## 2021-09-23 DIAGNOSIS — M2141 Flat foot [pes planus] (acquired), right foot: Secondary | ICD-10-CM

## 2021-09-23 DIAGNOSIS — Q828 Other specified congenital malformations of skin: Secondary | ICD-10-CM | POA: Diagnosis not present

## 2021-09-23 NOTE — Progress Notes (Signed)
Patient presents to the office today for diabetic shoe and insole measuring.  Patient was measured with brannock device to determine size and width for 1 pair of extra depth shoes and foam casted for 3 pair of insoles.   Documentation of medical necessity will be sent to patient's treating diabetic doctor to verify and sign.   Patient's diabetic provider: Dr. Earlie Lou  Shoes and insoles will be ordered at that time and patient will be notified for an appointment for fitting when they arrive.   Shoe size (per patient): 10 Wide   Brannock measurement: RIGHT - 9.5 D   LEFT - 9.5 D  Patient shoe selection-   Shoe choice:   Apex [id number]  Shoe size ordered: Women's 10 X-Wide  Patient ID: Linda Chase, female   DOB: Oct 28, 1955, 66 y.o.   MRN: [id number] Complaint:  Visit Type: Patient returns to my office for continued preventative foot care services. Complaint: Patient states" my nails have grown long and thick and become painful to walk and wear shoes" Patient has been diagnosed with DM with neuropathy.. She  presents for preventative foot care services. No changes to ROS  Podiatric Exam: Vascular: dorsalis pedis and posterior tibial pulses are  weakly palpable bilateral. Capillary return is immediate. Temperature gradient is WNL. Skin turgor WNL Absent hair digits  B/L. Sensorium:  Diminished Semmes Weinstein monofilament test. Normal tactile sensation bilaterally. Nail Exam: Pt has thick disfigured discolored nails with subungual debris noted bilateral entire nail hallux through fifth toenails Ulcer Exam: There is no evidence of ulcer or pre-ulcerative changes or infection. Orthopedic Exam: Muscle tone and strength are WNL. No limitations in general ROM. No crepitus or effusions noted. Foot type and digits show no abnormalities. Bony prominences are unremarkable.  Plantar flexed fifth metatarsal  B/L.  Pes planus Skin:  Porokeratosis sub 5th B/L. No infection or ulcers.     Diagnosis:  Tinea unguium, Pain in right toe, pain in left toes.    Treatment & Plan Procedures and Treatment: Consent by patient was obtained for treatment procedures. The patient understood the discussion of treatment and procedures well. All questions were answered thoroughly reviewed. Debridement of mycotic and hypertrophic toenails, 1 through 5 bilateral and clearing of subungual debris. Debride callus with # 15 blade.No ulceration, no infection noted.  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 Return Visit-Office Procedure: Patient instructed to return to the office for a follow up visit 3 months. for continued evaluation and treatment.  Helane Gunther DPM

## 2021-11-18 ENCOUNTER — Ambulatory Visit (INDEPENDENT_AMBULATORY_CARE_PROVIDER_SITE_OTHER): Payer: Medicare HMO

## 2021-11-18 DIAGNOSIS — M2142 Flat foot [pes planus] (acquired), left foot: Secondary | ICD-10-CM

## 2021-11-18 DIAGNOSIS — E119 Type 2 diabetes mellitus without complications: Secondary | ICD-10-CM

## 2021-11-18 DIAGNOSIS — M2141 Flat foot [pes planus] (acquired), right foot: Secondary | ICD-10-CM

## 2021-11-18 NOTE — Progress Notes (Signed)
Patient presents to the office today with issues concerning the diabetic shoes picked up on 11/28/21.   The shoes feel too big.  We will send the [employer]pex brand, style [place], size 10 women's x-wide back.   Reorder: [id number] 10 x-wide  Patient kept insoles for other shoes. Advised to bring back a pair to check fit of reorder.  Patient will be notified for a fitting appointment once the shoes arrive in office.

## 2021-12-07 ENCOUNTER — Ambulatory Visit (INDEPENDENT_AMBULATORY_CARE_PROVIDER_SITE_OTHER): Payer: Medicare HMO | Admitting: *Deleted

## 2021-12-07 DIAGNOSIS — E119 Type 2 diabetes mellitus without complications: Secondary | ICD-10-CM

## 2021-12-07 DIAGNOSIS — M2142 Flat foot [pes planus] (acquired), left foot: Secondary | ICD-10-CM | POA: Diagnosis not present

## 2021-12-07 DIAGNOSIS — M2141 Flat foot [pes planus] (acquired), right foot: Secondary | ICD-10-CM | POA: Diagnosis not present

## 2021-12-07 NOTE — Progress Notes (Signed)
Patient presents today to pick up diabetic shoe prescribed by Dr. MAYER.   Diabetic shoes and inserts were dispensed and fit was satisfactory. Reviewed instructions for break-in and wear. Written instructions given to patient.  Patient will follow up as needed.       

## 2021-12-07 NOTE — Patient Instructions (Signed)
Change inserts every 4 months. 1st pair was put in today, 2nd pair change in April, 3rd pair in August   

## 2021-12-28 ENCOUNTER — Ambulatory Visit: Payer: Medicare HMO | Admitting: Podiatry

## 2022-07-02 IMAGING — MG MM DIGITAL SCREENING BILAT W/ TOMO AND CAD
8 of 15 series · 8 of 40 positions shown · non-contrast
Comparison: Previous exam(s).

ACR Breast Density Category a: The breast tissue is almost entirely
fatty.

CLINICAL DATA: Screening.

EXAM:
DIGITAL SCREENING BILATERAL MAMMOGRAM WITH TOMOSYNTHESIS AND CAD
TECHNIQUE: Bilateral screening digital craniocaudal and mediolateral oblique
mammograms were obtained. Bilateral screening digital breast
tomosynthesis was performed. The images were evaluated with
computer-aided detection.

[L MLO synth-2D (1 of 3)]
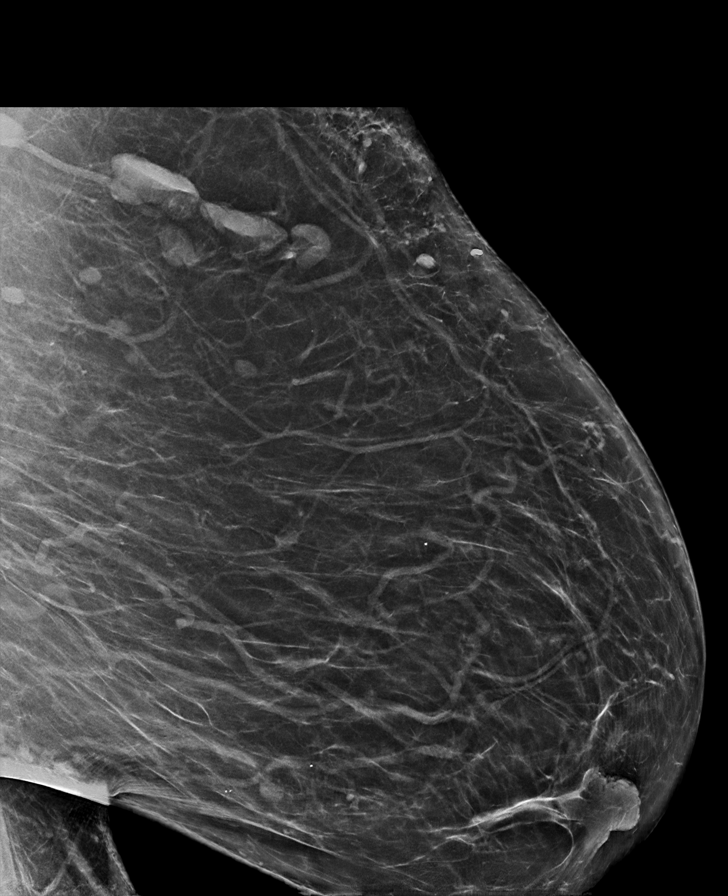

[L XCCL synth-2D]
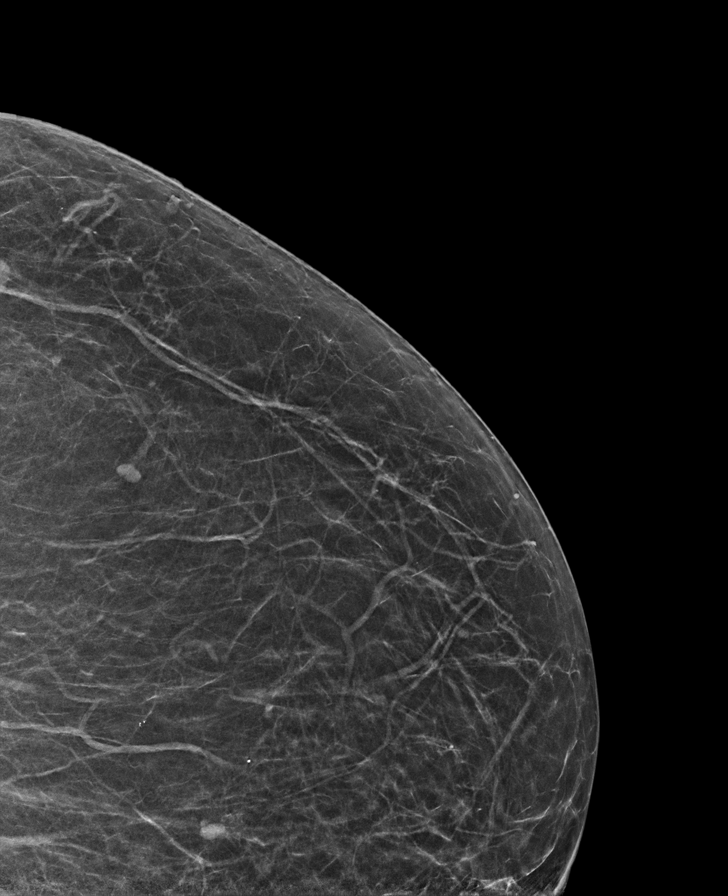

[L MLO synth-2D (2 of 3)]
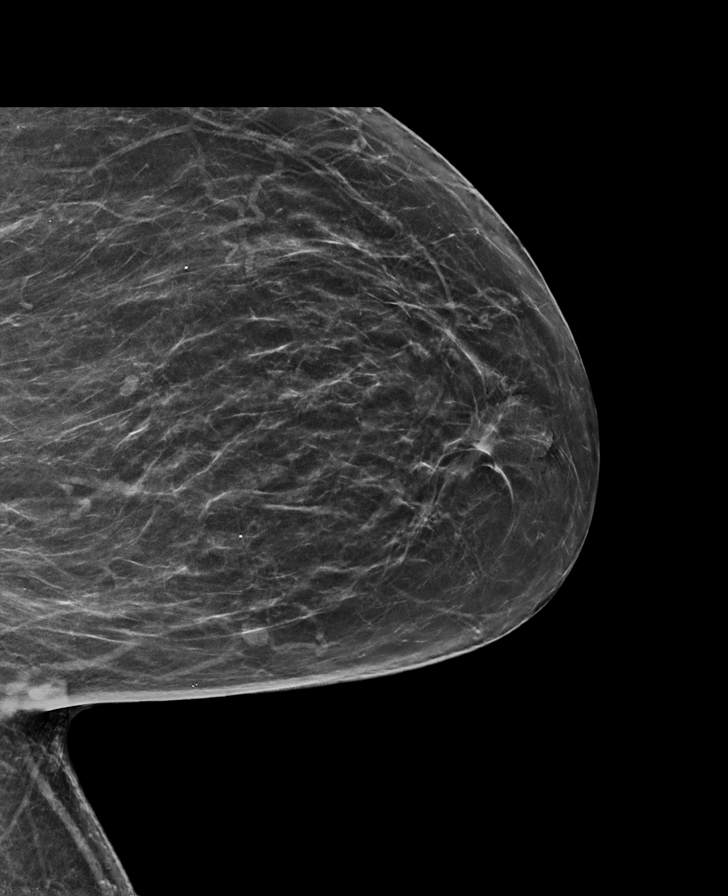

[L MLO synth-2D (3 of 3)]
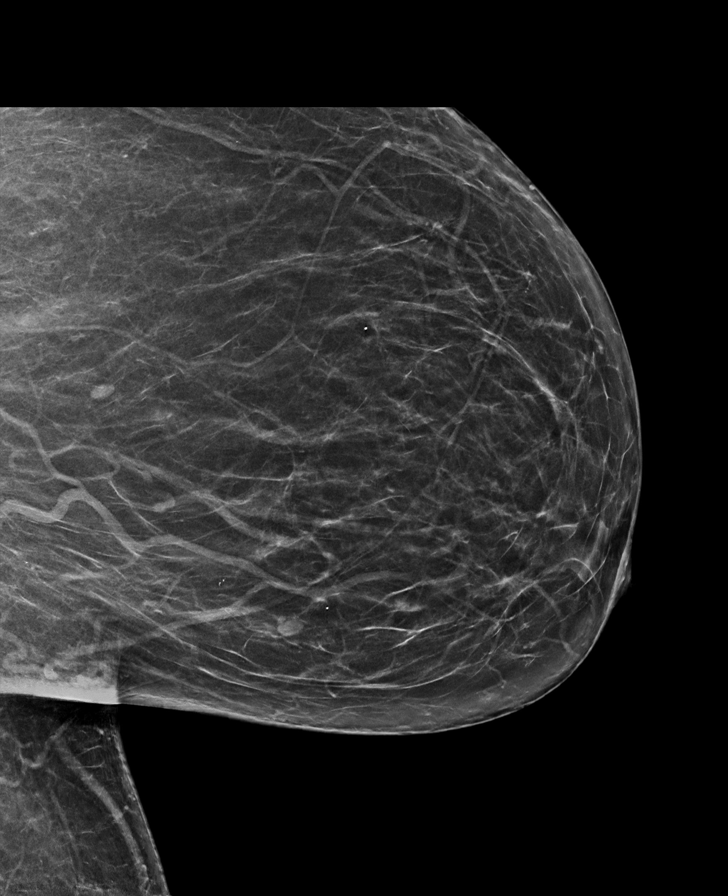

[R MLO synth-2D]
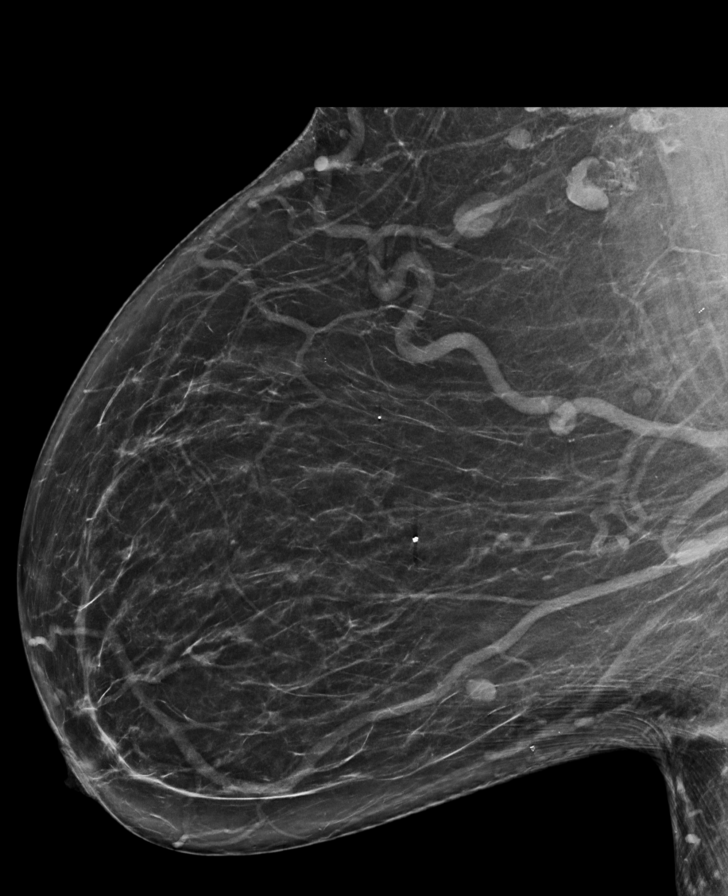

[R XCCL synth-2D]
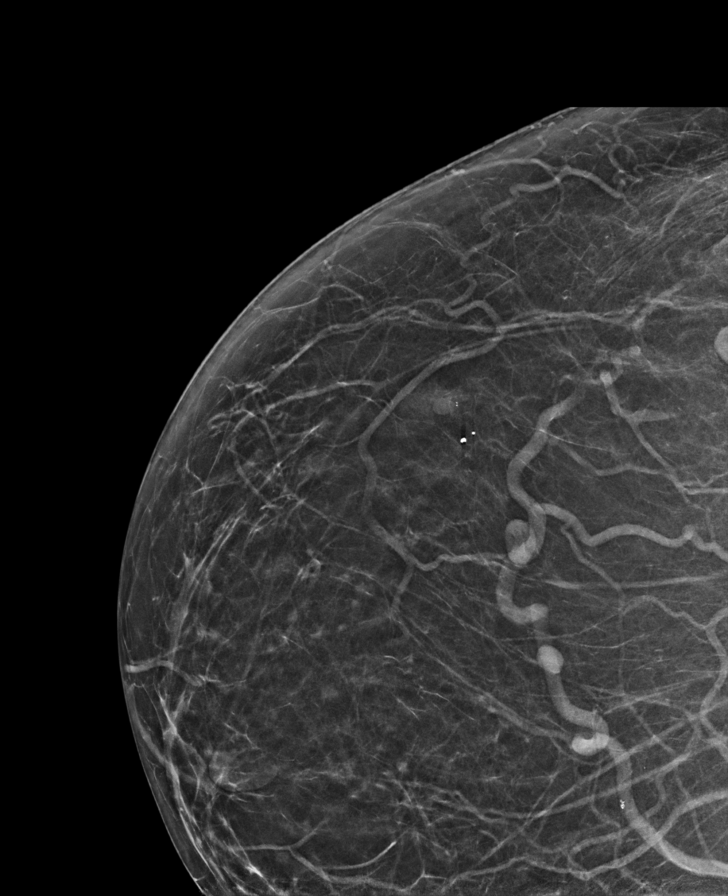

[L CC synth-2D]
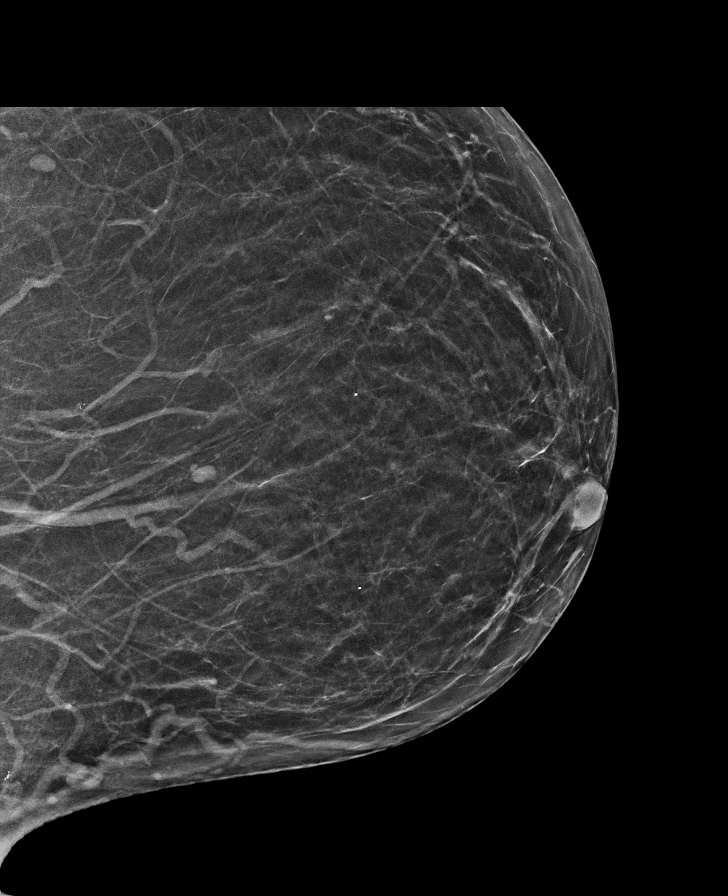

[L MLO tomo · tomo slice 43/63.0]
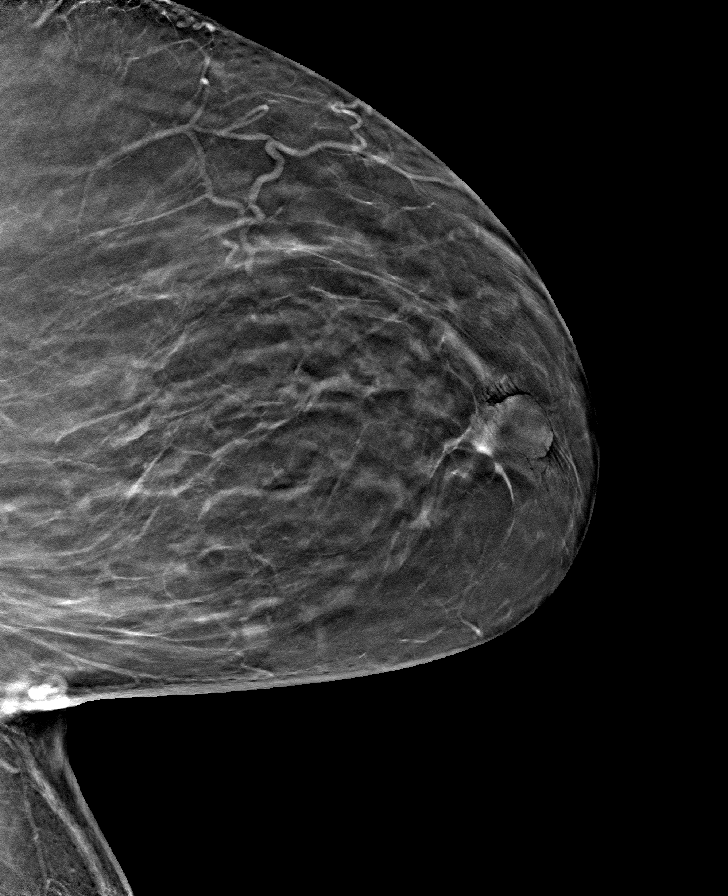

[8 of 40 positions shown; findings below may reference images not displayed]

FINDINGS: There are no findings suspicious for malignancy.
IMPRESSION: No mammographic evidence of malignancy. A result letter of this
screening mammogram will be mailed directly to the patient.

RECOMMENDATION:
Screening mammogram in one year. (Code:0E-3-N98)

BI-RADS CATEGORY  1: Negative.

## 2022-08-18 ENCOUNTER — Encounter: Payer: Self-pay | Admitting: Podiatry

## 2022-08-18 ENCOUNTER — Ambulatory Visit: Payer: Medicare HMO | Admitting: Podiatry

## 2022-08-18 DIAGNOSIS — M2141 Flat foot [pes planus] (acquired), right foot: Secondary | ICD-10-CM

## 2022-08-18 DIAGNOSIS — M2142 Flat foot [pes planus] (acquired), left foot: Secondary | ICD-10-CM | POA: Diagnosis not present

## 2022-08-18 DIAGNOSIS — B351 Tinea unguium: Secondary | ICD-10-CM | POA: Diagnosis not present

## 2022-08-18 DIAGNOSIS — M216X9 Other acquired deformities of unspecified foot: Secondary | ICD-10-CM

## 2022-08-18 DIAGNOSIS — E119 Type 2 diabetes mellitus without complications: Secondary | ICD-10-CM

## 2022-08-18 DIAGNOSIS — M79675 Pain in left toe(s): Secondary | ICD-10-CM | POA: Diagnosis not present

## 2022-08-18 NOTE — Progress Notes (Signed)
Patient ID: Linda Chase, female   DOB: [birth year], 67 y.o.   MRN: [id number] Complaint:  Visit Type: Patient returns to my office for continued preventative foot care services. Complaint: Patient states" my nails have grown long and thick and become painful to walk and wear shoes" Patient has been diagnosed with DM with neuropathy.. She  presents for preventative foot care services. No changes to ROS  Podiatric Exam: Vascular: dorsalis pedis and posterior tibial pulses are  weakly palpable bilateral. Capillary return is immediate. Temperature gradient is WNL. Skin turgor WNL Absent hair digits  B/L. Sensorium: Semmes Weinstein monofilament test diminished  Diminished tactile sensation bilaterally. Nail Exam: Pt has thick disfigured discolored nails with subungual debris noted bilateral entire nail hallux through fifth toenails Ulcer Exam: There is no evidence of ulcer or pre-ulcerative changes or infection. Orthopedic Exam: Muscle tone and strength are WNL. No limitations in general ROM. No crepitus or effusions noted. Foot type and digits show no abnormalities. Bony prominences are unremarkable. Skin   No infection or ulcers.    Diagnosis:  Tinea unguium, Pain in right toe, pain in left toes.    Treatment & Plan Procedures and Treatment: Consent by patient was obtained for treatment procedures. The patient understood the discussion of treatment and procedures well. All questions were answered thoroughly reviewed. Debridement of mycotic and hypertrophic toenails, 1 through 5 bilateral and clearing of subungual debris. Patient qualifies for diabetic shoes with DPN, pes planus and plantar flexed metatarsal.  Return Visit-Office Procedure: Patient instructed to return to the office for a follow up visit 3 months. for continued evaluation and treatment.  Helane Gunther DPM

## 2022-09-06 ENCOUNTER — Emergency Department (HOSPITAL_BASED_OUTPATIENT_CLINIC_OR_DEPARTMENT_OTHER): Payer: Medicare HMO

## 2022-09-06 ENCOUNTER — Other Ambulatory Visit: Payer: Self-pay

## 2022-09-06 ENCOUNTER — Inpatient Hospital Stay (HOSPITAL_BASED_OUTPATIENT_CLINIC_OR_DEPARTMENT_OTHER)
Admission: EM | Admit: 2022-09-06 | Discharge: 2022-09-22 | DRG: 189 | Disposition: A | Discharge status: Home Health | Payer: Medicare HMO | Attending: Internal Medicine | Admitting: Internal Medicine

## 2022-09-06 ENCOUNTER — Inpatient Hospital Stay (HOSPITAL_COMMUNITY): Payer: Medicare HMO

## 2022-09-06 ENCOUNTER — Encounter (HOSPITAL_BASED_OUTPATIENT_CLINIC_OR_DEPARTMENT_OTHER): Payer: Self-pay

## 2022-09-06 DIAGNOSIS — E1165 Type 2 diabetes mellitus with hyperglycemia: Secondary | ICD-10-CM | POA: Diagnosis present

## 2022-09-06 DIAGNOSIS — I2510 Atherosclerotic heart disease of native coronary artery without angina pectoris: Secondary | ICD-10-CM | POA: Diagnosis present

## 2022-09-06 DIAGNOSIS — I159 Secondary hypertension, unspecified: Secondary | ICD-10-CM | POA: Diagnosis not present

## 2022-09-06 DIAGNOSIS — I700 Atherosclerosis of aorta: Secondary | ICD-10-CM | POA: Diagnosis present

## 2022-09-06 DIAGNOSIS — J449 Chronic obstructive pulmonary disease, unspecified: Secondary | ICD-10-CM | POA: Diagnosis present

## 2022-09-06 DIAGNOSIS — E785 Hyperlipidemia, unspecified: Secondary | ICD-10-CM | POA: Diagnosis present

## 2022-09-06 DIAGNOSIS — I77810 Thoracic aortic ectasia: Secondary | ICD-10-CM | POA: Diagnosis present

## 2022-09-06 DIAGNOSIS — G8929 Other chronic pain: Secondary | ICD-10-CM | POA: Diagnosis present

## 2022-09-06 DIAGNOSIS — J9622 Acute and chronic respiratory failure with hypercapnia: Secondary | ICD-10-CM | POA: Diagnosis present

## 2022-09-06 DIAGNOSIS — Z885 Allergy status to narcotic agent status: Secondary | ICD-10-CM

## 2022-09-06 DIAGNOSIS — Z538 Procedure and treatment not carried out for other reasons: Secondary | ICD-10-CM | POA: Diagnosis present

## 2022-09-06 DIAGNOSIS — J9811 Atelectasis: Secondary | ICD-10-CM | POA: Diagnosis present

## 2022-09-06 DIAGNOSIS — I5043 Acute on chronic combined systolic (congestive) and diastolic (congestive) heart failure: Secondary | ICD-10-CM | POA: Diagnosis not present

## 2022-09-06 DIAGNOSIS — I50812 Chronic right heart failure: Secondary | ICD-10-CM | POA: Diagnosis present

## 2022-09-06 DIAGNOSIS — M4620 Osteomyelitis of vertebra, site unspecified: Secondary | ICD-10-CM | POA: Diagnosis not present

## 2022-09-06 DIAGNOSIS — R911 Solitary pulmonary nodule: Secondary | ICD-10-CM | POA: Diagnosis present

## 2022-09-06 DIAGNOSIS — M1990 Unspecified osteoarthritis, unspecified site: Secondary | ICD-10-CM | POA: Diagnosis present

## 2022-09-06 DIAGNOSIS — Z7901 Long term (current) use of anticoagulants: Secondary | ICD-10-CM

## 2022-09-06 DIAGNOSIS — F17210 Nicotine dependence, cigarettes, uncomplicated: Secondary | ICD-10-CM | POA: Diagnosis not present

## 2022-09-06 DIAGNOSIS — I50813 Acute on chronic right heart failure: Secondary | ICD-10-CM | POA: Diagnosis not present

## 2022-09-06 DIAGNOSIS — D649 Anemia, unspecified: Secondary | ICD-10-CM | POA: Diagnosis not present

## 2022-09-06 DIAGNOSIS — I110 Hypertensive heart disease with heart failure: Secondary | ICD-10-CM | POA: Diagnosis present

## 2022-09-06 DIAGNOSIS — I5033 Acute on chronic diastolic (congestive) heart failure: Secondary | ICD-10-CM | POA: Diagnosis not present

## 2022-09-06 DIAGNOSIS — Z8679 Personal history of other diseases of the circulatory system: Secondary | ICD-10-CM

## 2022-09-06 DIAGNOSIS — Z1152 Encounter for screening for COVID-19: Secondary | ICD-10-CM | POA: Diagnosis not present

## 2022-09-06 DIAGNOSIS — I083 Combined rheumatic disorders of mitral, aortic and tricuspid valves: Secondary | ICD-10-CM | POA: Diagnosis present

## 2022-09-06 DIAGNOSIS — N200 Calculus of kidney: Secondary | ICD-10-CM | POA: Diagnosis present

## 2022-09-06 DIAGNOSIS — R599 Enlarged lymph nodes, unspecified: Secondary | ICD-10-CM | POA: Diagnosis present

## 2022-09-06 DIAGNOSIS — E119 Type 2 diabetes mellitus without complications: Secondary | ICD-10-CM

## 2022-09-06 DIAGNOSIS — D72829 Elevated white blood cell count, unspecified: Secondary | ICD-10-CM | POA: Diagnosis not present

## 2022-09-06 DIAGNOSIS — E1140 Type 2 diabetes mellitus with diabetic neuropathy, unspecified: Secondary | ICD-10-CM | POA: Diagnosis present

## 2022-09-06 DIAGNOSIS — F32A Depression, unspecified: Secondary | ICD-10-CM | POA: Diagnosis present

## 2022-09-06 DIAGNOSIS — K449 Diaphragmatic hernia without obstruction or gangrene: Secondary | ICD-10-CM | POA: Diagnosis present

## 2022-09-06 DIAGNOSIS — I480 Paroxysmal atrial fibrillation: Secondary | ICD-10-CM | POA: Diagnosis present

## 2022-09-06 DIAGNOSIS — R1900 Intra-abdominal and pelvic swelling, mass and lump, unspecified site: Secondary | ICD-10-CM | POA: Diagnosis present

## 2022-09-06 DIAGNOSIS — E662 Morbid (severe) obesity with alveolar hypoventilation: Secondary | ICD-10-CM | POA: Diagnosis present

## 2022-09-06 DIAGNOSIS — G4733 Obstructive sleep apnea (adult) (pediatric): Secondary | ICD-10-CM | POA: Insufficient documentation

## 2022-09-06 DIAGNOSIS — D569 Thalassemia, unspecified: Secondary | ICD-10-CM | POA: Diagnosis present

## 2022-09-06 DIAGNOSIS — K5730 Diverticulosis of large intestine without perforation or abscess without bleeding: Secondary | ICD-10-CM | POA: Diagnosis present

## 2022-09-06 DIAGNOSIS — J9602 Acute respiratory failure with hypercapnia: Secondary | ICD-10-CM | POA: Diagnosis not present

## 2022-09-06 DIAGNOSIS — M4624 Osteomyelitis of vertebra, thoracic region: Secondary | ICD-10-CM | POA: Diagnosis present

## 2022-09-06 DIAGNOSIS — I2729 Other secondary pulmonary hypertension: Secondary | ICD-10-CM | POA: Diagnosis present

## 2022-09-06 DIAGNOSIS — I4891 Unspecified atrial fibrillation: Secondary | ICD-10-CM | POA: Diagnosis not present

## 2022-09-06 DIAGNOSIS — I872 Venous insufficiency (chronic) (peripheral): Secondary | ICD-10-CM | POA: Diagnosis present

## 2022-09-06 DIAGNOSIS — M4644 Discitis, unspecified, thoracic region: Secondary | ICD-10-CM | POA: Diagnosis present

## 2022-09-06 DIAGNOSIS — I4819 Other persistent atrial fibrillation: Secondary | ICD-10-CM | POA: Diagnosis present

## 2022-09-06 DIAGNOSIS — R935 Abnormal findings on diagnostic imaging of other abdominal regions, including retroperitoneum: Secondary | ICD-10-CM | POA: Diagnosis present

## 2022-09-06 DIAGNOSIS — I2721 Secondary pulmonary arterial hypertension: Secondary | ICD-10-CM | POA: Diagnosis present

## 2022-09-06 DIAGNOSIS — E1169 Type 2 diabetes mellitus with other specified complication: Secondary | ICD-10-CM | POA: Diagnosis present

## 2022-09-06 DIAGNOSIS — Z6841 Body Mass Index (BMI) 40.0 and over, adult: Secondary | ICD-10-CM | POA: Diagnosis not present

## 2022-09-06 DIAGNOSIS — M4316 Spondylolisthesis, lumbar region: Secondary | ICD-10-CM | POA: Diagnosis present

## 2022-09-06 DIAGNOSIS — Z635 Disruption of family by separation and divorce: Secondary | ICD-10-CM

## 2022-09-06 DIAGNOSIS — K5900 Constipation, unspecified: Secondary | ICD-10-CM | POA: Diagnosis present

## 2022-09-06 DIAGNOSIS — I10 Essential (primary) hypertension: Secondary | ICD-10-CM | POA: Diagnosis present

## 2022-09-06 DIAGNOSIS — Z713 Dietary counseling and surveillance: Secondary | ICD-10-CM

## 2022-09-06 DIAGNOSIS — Z79899 Other long term (current) drug therapy: Secondary | ICD-10-CM

## 2022-09-06 DIAGNOSIS — I2781 Cor pulmonale (chronic): Secondary | ICD-10-CM | POA: Diagnosis present

## 2022-09-06 DIAGNOSIS — J9621 Acute and chronic respiratory failure with hypoxia: Secondary | ICD-10-CM | POA: Diagnosis present

## 2022-09-06 DIAGNOSIS — M4317 Spondylolisthesis, lumbosacral region: Secondary | ICD-10-CM | POA: Diagnosis present

## 2022-09-06 DIAGNOSIS — R32 Unspecified urinary incontinence: Secondary | ICD-10-CM | POA: Diagnosis present

## 2022-09-06 DIAGNOSIS — K760 Fatty (change of) liver, not elsewhere classified: Secondary | ICD-10-CM | POA: Diagnosis present

## 2022-09-06 DIAGNOSIS — I509 Heart failure, unspecified: Secondary | ICD-10-CM | POA: Diagnosis not present

## 2022-09-06 DIAGNOSIS — R262 Difficulty in walking, not elsewhere classified: Secondary | ICD-10-CM | POA: Diagnosis present

## 2022-09-06 DIAGNOSIS — Z7984 Long term (current) use of oral hypoglycemic drugs: Secondary | ICD-10-CM

## 2022-09-06 DIAGNOSIS — M48061 Spinal stenosis, lumbar region without neurogenic claudication: Secondary | ICD-10-CM | POA: Diagnosis present

## 2022-09-06 DIAGNOSIS — M47816 Spondylosis without myelopathy or radiculopathy, lumbar region: Secondary | ICD-10-CM | POA: Diagnosis present

## 2022-09-06 DIAGNOSIS — E874 Mixed disorder of acid-base balance: Secondary | ICD-10-CM | POA: Diagnosis present

## 2022-09-06 DIAGNOSIS — M545 Low back pain, unspecified: Secondary | ICD-10-CM | POA: Diagnosis present

## 2022-09-06 LAB — BLOOD GAS, ARTERIAL
Acid-Base Excess: 3.6 mmol/L — ABNORMAL HIGH (ref 0.0–2.0)
Acid-Base Excess: 8.6 mmol/L — ABNORMAL HIGH (ref 0.0–2.0)
Bicarbonate: 31.4 mmol/L — ABNORMAL HIGH (ref 20.0–28.0)
Bicarbonate: 35.7 mmol/L — ABNORMAL HIGH (ref 20.0–28.0)
Drawn by: 36277
O2 Saturation: 99.5 %
O2 Saturation: 99.3 %
Patient temperature: 37
Patient temperature: 37
pCO2 arterial: 61 mmHg — ABNORMAL HIGH (ref 32–48)
pCO2 arterial: 59 mmHg — ABNORMAL HIGH (ref 32–48)
pH, Arterial: 7.32 — ABNORMAL LOW (ref 7.35–7.45)
pH, Arterial: 7.39 (ref 7.35–7.45)
pO2, Arterial: 118 mmHg — ABNORMAL HIGH (ref 83–108)
pO2, Arterial: 129 mmHg — ABNORMAL HIGH (ref 83–108)

## 2022-09-06 LAB — URINALYSIS, ROUTINE W REFLEX MICROSCOPIC
Bacteria, UA: NONE SEEN
Bilirubin Urine: NEGATIVE
Glucose, UA: NEGATIVE mg/dL
Hgb urine dipstick: NEGATIVE
Ketones, ur: NEGATIVE mg/dL
Leukocytes,Ua: NEGATIVE
Nitrite: NEGATIVE
Protein, ur: 30 mg/dL — AB
Specific Gravity, Urine: >1.046 — ABNORMAL HIGH (ref 1.005–1.030)
pH: 5 (ref 5.0–8.0)

## 2022-09-06 LAB — I-STAT VENOUS BLOOD GAS, ED
Acid-Base Excess: 4 mmol/L — ABNORMAL HIGH (ref 0.0–2.0)
Bicarbonate: 34 mmol/L — ABNORMAL HIGH (ref 20.0–28.0)
Calcium, Ion: 1.2 mmol/L (ref 1.15–1.40)
HCT: 36 % (ref 36.0–46.0)
Hemoglobin: 12.2 g/dL (ref 12.0–15.0)
O2 Saturation: 41 %
Patient temperature: 98
Potassium: 3.9 mmol/L (ref 3.5–5.1)
Sodium: 139 mmol/L (ref 135–145)
TCO2: 36 mmol/L — ABNORMAL HIGH (ref 22–32)
pCO2, Ven: 80.2 mmHg (ref 44–60)
pH, Ven: 7.234 — ABNORMAL LOW (ref 7.25–7.43)
pO2, Ven: 29 mmHg — CL (ref 32–45)

## 2022-09-06 LAB — CBC WITH DIFFERENTIAL/PLATELET
Abs Immature Granulocytes: 0.03 10*3/uL (ref 0.00–0.07)
Basophils Absolute: 0 10*3/uL (ref 0.0–0.1)
Basophils Relative: 0 %
Eosinophils Absolute: 0.2 10*3/uL (ref 0.0–0.5)
Eosinophils Relative: 2 %
HCT: 37.1 % (ref 36.0–46.0)
Hemoglobin: 11 g/dL — ABNORMAL LOW (ref 12.0–15.0)
Immature Granulocytes: 0 %
Lymphocytes Relative: 11 %
Lymphs Abs: 1.3 10*3/uL (ref 0.7–4.0)
MCH: 24.9 pg — ABNORMAL LOW (ref 26.0–34.0)
MCHC: 29.6 g/dL — ABNORMAL LOW (ref 30.0–36.0)
MCV: 83.9 fL (ref 80.0–100.0)
Monocytes Absolute: 0.6 10*3/uL (ref 0.1–1.0)
Monocytes Relative: 6 %
Neutro Abs: 8.9 10*3/uL — ABNORMAL HIGH (ref 1.7–7.7)
Neutrophils Relative %: 81 %
Platelets: 355 10*3/uL (ref 150–400)
RBC: 4.42 MIL/uL (ref 3.87–5.11)
RDW: 15.1 % (ref 11.5–15.5)
WBC: 11 10*3/uL — ABNORMAL HIGH (ref 4.0–10.5)
nRBC: 0 % (ref 0.0–0.2)

## 2022-09-06 LAB — MAGNESIUM: Magnesium: 2.1 mg/dL (ref 1.7–2.4)

## 2022-09-06 LAB — HIV ANTIBODY (ROUTINE TESTING W REFLEX): HIV Screen 4th Generation wRfx: NONREACTIVE

## 2022-09-06 LAB — COMPREHENSIVE METABOLIC PANEL
ALT: 12 U/L (ref 0–44)
AST: 16 U/L (ref 15–41)
Albumin: 3.8 g/dL (ref 3.5–5.0)
Alkaline Phosphatase: 68 U/L (ref 38–126)
Anion gap: 6 (ref 5–15)
BUN: 12 mg/dL (ref 8–23)
CO2: 31 mmol/L (ref 22–32)
Calcium: 9.3 mg/dL (ref 8.9–10.3)
Chloride: 101 mmol/L (ref 98–111)
Creatinine, Ser: 0.89 mg/dL (ref 0.44–1.00)
GFR, Estimated: >60 mL/min (ref >60)
Glucose, Bld: 129 mg/dL — ABNORMAL HIGH (ref 70–99)
Potassium: 3.8 mmol/L (ref 3.5–5.1)
Sodium: 138 mmol/L (ref 135–145)
Total Bilirubin: 0.4 mg/dL (ref 0.3–1.2)
Total Protein: 7.8 g/dL (ref 6.5–8.1)

## 2022-09-06 LAB — D-DIMER, QUANTITATIVE: D-Dimer, Quant: 0.58 ug{FEU}/mL — ABNORMAL HIGH (ref 0.00–0.50)

## 2022-09-06 LAB — C-REACTIVE PROTEIN: CRP: 4.3 mg/dL — ABNORMAL HIGH (ref <1.0)

## 2022-09-06 LAB — GLUCOSE, CAPILLARY: Glucose-Capillary: 122 mg/dL — ABNORMAL HIGH (ref 70–99)

## 2022-09-06 LAB — TSH: TSH: 1.776 u[IU]/mL (ref 0.350–4.500)

## 2022-09-06 LAB — BRAIN NATRIURETIC PEPTIDE: B Natriuretic Peptide: 221.4 pg/mL — ABNORMAL HIGH (ref 0.0–100.0)

## 2022-09-06 LAB — PROCALCITONIN: Procalcitonin: <0.1 ng/mL

## 2022-09-06 MED ORDER — HYDROMORPHONE HCL 1 MG/ML IJ SOLN: 0.5000 mg | INTRAMUSCULAR | Status: DC | PRN

## 2022-09-06 MED ORDER — ENOXAPARIN SODIUM 150 MG/ML IJ SOSY
140.0000 mg | PREFILLED_SYRINGE | Freq: Two times a day (BID) | INTRAMUSCULAR | Status: DC
  Administered 2022-09-06 – 2022-09-07 (×2): 140 mg via SUBCUTANEOUS
  Filled 2022-09-06 (×2): qty 0.94

## 2022-09-06 MED ORDER — ALBUTEROL SULFATE (2.5 MG/3ML) 0.083% IN NEBU: 2.5000 mg | INHALATION_SOLUTION | RESPIRATORY_TRACT | Status: DC | PRN

## 2022-09-06 MED ORDER — IPRATROPIUM-ALBUTEROL 0.5-2.5 (3) MG/3ML IN SOLN: 3.0000 mL | Freq: Four times a day (QID) | RESPIRATORY_TRACT | Status: DC | PRN

## 2022-09-06 MED ORDER — ACETAMINOPHEN 650 MG RE SUPP: 650.0000 mg | Freq: Four times a day (QID) | RECTAL | Status: DC | PRN

## 2022-09-06 MED ORDER — ACETAMINOPHEN 325 MG PO TABS
650.0000 mg | ORAL_TABLET | Freq: Four times a day (QID) | ORAL | Status: DC | PRN
  Administered 2022-09-09 – 2022-09-21 (×5): 650 mg via ORAL
  Filled 2022-09-06 (×5): qty 2

## 2022-09-06 MED ORDER — DILTIAZEM HCL-DEXTROSE 125-5 MG/125ML-% IV SOLN (PREMIX)
5.0000 mg/h | INTRAVENOUS | Status: DC
  Administered 2022-09-06: 5 mg/h via INTRAVENOUS
  Administered 2022-09-06 – 2022-09-08 (×3): 10 mg/h via INTRAVENOUS
  Filled 2022-09-06 (×4): qty 125

## 2022-09-06 MED ORDER — SODIUM CHLORIDE 0.9% FLUSH
3.0000 mL | Freq: Two times a day (BID) | INTRAVENOUS | Status: DC
  Administered 2022-09-06 – 2022-09-22 (×28): 3 mL via INTRAVENOUS

## 2022-09-06 MED ORDER — ONDANSETRON HCL 4 MG PO TABS: 4.0000 mg | ORAL_TABLET | Freq: Four times a day (QID) | ORAL | Status: DC | PRN

## 2022-09-06 MED ORDER — FUROSEMIDE 10 MG/ML IJ SOLN
40.0000 mg | Freq: Two times a day (BID) | INTRAMUSCULAR | Status: DC
  Administered 2022-09-06: 40 mg via INTRAVENOUS
  Filled 2022-09-06: qty 4

## 2022-09-06 MED ORDER — IOHEXOL 300 MG/ML  SOLN
100.0000 mL | Freq: Once | INTRAMUSCULAR | Status: AC | PRN
  Administered 2022-09-06: 100 mL via INTRAVENOUS

## 2022-09-06 MED ORDER — HYDROMORPHONE HCL 1 MG/ML IJ SOLN
1.0000 mg | Freq: Once | INTRAMUSCULAR | Status: AC
  Administered 2022-09-06: 1 mg via INTRAMUSCULAR
  Filled 2022-09-06: qty 1

## 2022-09-06 MED ORDER — IPRATROPIUM-ALBUTEROL 0.5-2.5 (3) MG/3ML IN SOLN
3.0000 mL | Freq: Once | RESPIRATORY_TRACT | Status: AC
  Administered 2022-09-06: 3 mL via RESPIRATORY_TRACT
  Filled 2022-09-06: qty 3

## 2022-09-06 MED ORDER — ONDANSETRON HCL 4 MG/2ML IJ SOLN: 4.0000 mg | Freq: Four times a day (QID) | INTRAMUSCULAR | Status: DC | PRN

## 2022-09-06 NOTE — Progress Notes (Signed)
Pharmacy Consult for Lovenox  Indication: atrial fibrillation  Allergies  Allergen Reactions   Hydrocodone Nausea And Vomiting    Patient Measurements: Height: 5\' 4"  (162.6 cm) Weight: (!) 140.6 kg (309 lb 15.5 oz) IBW/kg (Calculated) : 54.7 Heparin Dosing Weight: HEPARIN DW (KG): 90  Vital Signs: Temp: 97.6 F (36.4 C) (08/26 1028) Temp Source: Axillary (08/26 1028) BP: 131/65 (08/26 1028) Pulse Rate: 78 (08/26 1028)  Labs: Recent Labs    09/06/22 0237 09/06/22 0442  HGB 11.0* 12.2  HCT 37.1 36.0  PLT 355  --   CREATININE 0.89  --     Estimated Creatinine Clearance: 87.5 mL/min (by C-G formula based on SCr of 0.89 mg/dL).  Assessment: Linda Chase a 67 y.o. female presented with severe back pain, found to be in atrial fibrillation. Pharmacy consulted to dose enoxaparin. As patient is currently unable to provide timing of last apixaban dose, will cautiously dose as if the last dose was early AM 8/26. Patient obese with BMI>50, renal function >50 mL/min. Patient is currently on Bi-Pap unable to take oral medications. No signs of PE on CT.   Anticoagulation PTA: apixaban 5 mg BID for unknown indication- Last dose unknown, patient unable to provide information at this time.   Goal of Therapy:  Monitor platelets by anticoagulation protocol: Yes   Plan:  START enoxaparin 140 mg (~ 1 mg/kg) Q12H  CBC daily  F/U transition to apixaban as able Levels PRN   Linda Chase, PharmD Clinical Pharmacist  09/06/2022 12:34 PM

## 2022-09-06 NOTE — Progress Notes (Signed)
ABG noted pH to be 7.32 with pCO2 61, and pO2 118.  Patient still appears to have a respiratory acidosis with hypercapnia.  Continue Bipap. BNP  221.4, CRP 4.3, and procalcitonin <0.1.  Urinalysis did not show any signs of infection.  No signes of cellulitis on abdomen.  Orders placed to give the patient Lasix 40 mg IV x 1 dose. Follow up echo.

## 2022-09-06 NOTE — Progress Notes (Addendum)
Plan of Care Note for accepted transfer   Patient: Linda Chase MRN: [id number]   DOA: 09/06/2022  Facility requesting transfer: [facility]   Requesting Provider: Dr. Bebe Shaggy   Reason for transfer: Rapid atrial fibrillation, acute hypercarbic respiratory failure   Facility course: 67 year old female with HTN, DM, COPD, BMI 50, chronic back pain, and on Eliquis (patient unsure of the indication for this) who presents with severe back pain.  She is in rapid atrial fibrillation with rate in the 130s, saturating low 80s on room air, and had pCO2 of 80.  CT L-spine demonstrates degenerative disease without acute findings.   She was treated with Dilaudid, started on diltiazem infusion, and started on BiPAP in the ED.  ED physician notes that her respiratory issues may have been precipitated by treatment with Dilaudid.   Plan of care: The patient is accepted for admission to progressive unit, at [facility].   Author: Briscoe Deutscher, MD 09/06/2022  Check [email or web address] for on-call coverage.  Nursing staff, Please call [facility] Admits & Consults System-Wide number on Amion as soon as patient's arrival, so appropriate admitting provider can evaluate the pt.

## 2022-09-06 NOTE — Progress Notes (Signed)
RT assesses the Pt and noticed that her head was tilted more toward her chest. RT made a bolster and placed it under her neck and also placed a pillow under her right shoulder.RT also lowered the head of the bed to see if her head would lay back more. Rt isnt sure how much this helped but is leaving her in this position.RT will continue to monitor the Pt.

## 2022-09-06 NOTE — ED Provider Notes (Signed)
[place] EMERGENCY DEPARTMENT AT [facility] Provider Note   CSN: [id number] Arrival date & time: 09/06/22  0131     History  Chief complaint-back pain   Linda Chase is a 67 y.o. female.  The history is provided by the patient and the EMS personnel.  Patient with history of hypertension, diabetes, obesity presents with back pain and spasms.  Patient reports she gets these symptoms frequently.  She reports the tonight the pain became so severe in her low back that she was unable to get around.  She called ambulance for assistance.  Patient reports she has been seen for this previously and was told it was back spasms and given naproxen and muscle relaxants.  No new trauma.  No new falls.  No fevers or vomiting.  No new chest pain.  No abdominal pain.  She is able to ambulate.  No new weakness.  No fecal incontinence.  She reports when the pain becomes severe she does have difficulty controlling her urine but no other incontinence    Past Medical History:  Diagnosis Date   Arthritis    Cerebral aneurysm    x2   Depression    Diabetes mellitus    Hypertension     Home Medications Prior to Admission medications   Medication Sig Start Date End Date Taking? Authorizing Provider  apixaban (ELIQUIS) 5 MG TABS tablet Take 5 mg by mouth 2 (two) times daily.   Yes [provider]  atorvastatin (LIPITOR) 20 MG tablet Take 20 mg by mouth daily.   Yes [provider]  gabapentin (NEURONTIN) 300 MG capsule Take 300 mg by mouth at bedtime.   Yes [provider]  metoprolol tartrate (LOPRESSOR) 25 MG tablet Take 25 mg by mouth 2 (two) times daily.   Yes [provider]  potassium chloride (KLOR-CON) 10 MEQ tablet  11/28/18  Yes [provider]  amLODipine (NORVASC) 5 MG tablet Take 1 tablet (5 mg total) by mouth daily. Patient taking differently: Take 5 mg by mouth every evening. 02/05/12   Catarina Hartshorn, MD   Calcium-Phosphorus-Vitamin D (CALCIUM/VITAMIN D3/ADULT GUMMY PO) GUMMY VITAMIN D 2000 06/15/17   [provider]  cholecalciferol (VITAMIN D) 1000 units tablet Take 2,000 Units by mouth daily. Gummies    [provider]  hydrochlorothiazide (HYDRODIURIL) 25 MG tablet Take 25 mg by mouth every morning. 07/31/15   [provider]  metFORMIN (GLUCOPHAGE) 1000 MG tablet Take 1,000 mg by mouth 2 (two) times daily with a meal.    [provider]  UNABLE TO FIND ADULT INCONTINENCE UNDERWEAR X 06/15/17   [provider]  vitamin E 400 UNIT capsule  06/15/17   [provider]      Allergies    Hydrocodone    Review of Systems   Review of Systems  Constitutional:  Negative for fever.  Gastrointestinal:  Negative for vomiting.  Musculoskeletal:  Positive for back pain.  Neurological:  Negative for weakness.    Physical Exam Updated Vital Signs BP 112/87   Pulse 82   Temp 98 F (36.7 C)   Resp 18   Ht 1.626 m (5\' 4" )   Wt 131.5 kg   SpO2 100%   BMI 49.78 kg/m  Physical Exam CONSTITUTIONAL: Chronically ill-appearing HEAD: Normocephalic/atraumatic EYES: EOMI/PERRL ENMT: Mucous membranes moist NECK: supple no meningeal signs SPINE/BACK: Mild lumbar and lower thoracic tenderness No cervical spine tenderness No bruising/crepitance/stepoffs noted to spine CV: Tachycardic and irregular LUNGS: Mild tachypnea,  decreased breath sounds bilaterally, exam limited due to body habitus ABDOMEN: soft, nontender, obese No focal tenderness.  No erythema, no evidence of abscess or cellulitis GU:no cva tenderness NEURO: Awake/alert, mental status is appropriate  no arm drift. She is able to flex both hips and flex both knees but limited due to pain.  She has equal ankle dorsi and plantarflexion.  She has great toe extension intact.  There is no sensory deficit in lower extremities EXTREMITIES:  full ROM Chronic symmetric edema noted to bilateral lower  extremities No deformities, no signs of cellulitis SKIN: warm, color normal PSYCH: Anxious  ED Results / Procedures / Treatments   Labs (all labs ordered are listed, but only abnormal results are displayed) Labs Reviewed  CBC WITH DIFFERENTIAL/PLATELET - Abnormal; Notable for the following components:      Result Value   WBC 11.0 (*)    Hemoglobin 11.0 (*)    MCH 24.9 (*)    MCHC 29.6 (*)    Neutro Abs 8.9 (*)    All other components within normal limits  COMPREHENSIVE METABOLIC PANEL - Abnormal; Notable for the following components:   Glucose, Bld 129 (*)    All other components within normal limits  I-STAT VENOUS BLOOD GAS, ED - Abnormal; Notable for the following components:   pH, Ven 7.234 (*)    pCO2, Ven 80.2 (*)    pO2, Ven 29 (*)    Bicarbonate 34.0 (*)    TCO2 36 (*)    Acid-Base Excess 4.0 (*)    All other components within normal limits  URINALYSIS, ROUTINE W REFLEX MICROSCOPIC    EKG EKG Interpretation Date/Time:  Monday September 06 2022 02:10:26 EDT Ventricular Rate:  133 PR Interval:    QRS Duration:  82 QT Interval:  334 QTC Calculation: 497 R Axis:   251  Text Interpretation: Atrial fibrillation with rapid ventricular response Possible Anterolateral infarct , age undetermined Abnormal ECG Confirmed by Zadie Rhine ([id number]) on 09/06/2022 2:14:12 AM  Radiology CT ABDOMEN PELVIS W CONTRAST  Result Date: 09/06/2022 CLINICAL DATA:  Abdominal and flank pain. EXAM: CT ABDOMEN AND PELVIS WITH CONTRAST TECHNIQUE: Multidetector CT imaging of the abdomen and pelvis was performed using the standard protocol following bolus administration of intravenous contrast. RADIATION DOSE REDUCTION: This exam was performed according to the departmental dose-optimization program which includes automated exposure control, adjustment of the mA and/or kV according to patient size and/or use of iterative reconstruction technique. CONTRAST:  OMNIPAQUE IOHEXOL 300 MG/ML  SOLN  COMPARISON:  Limited comparison is available with CTA chest 02/01/2012. Portable chest has also been performed today. FINDINGS: Lower chest: The heart is moderately enlarged. There is no pericardial effusion. Small hiatal hernia. There are three-vessel coronary calcifications greatest in the LAD. Scattered calcifications in the aortic valve leaflets. There are posterior atelectatic changes. Newly noted in the right lower lobe on 4:13 adjacent the posterior crest of the hemidiaphragm, there is a 1.6 cm nodular opacity. Also, at base of the right upper lobe on 4:3 there is a noncalcified 6 mm nodule not seen previously. Prompt chest CT is recommended to assess for additional nodules. Remaining lung bases are clear. Hepatobiliary: Respiratory motion limits fine detail. No liver mass is seen through the breathing motion. The liver is 21 cm length and mildly steatotic. The gallbladder and bile ducts are unremarkable. Pancreas: No abnormality is seen through breathing motion. Spleen: No abnormality is seen through the breathing motion. No splenomegaly. Adrenals/Urinary Tract: Also with limited fine  detail in the kidneys due to breathing motion. No focal abnormality is suspected in the adrenal glands and kidneys. There is a 3 mm caliceal stone in the inferior pole of the right kidney. No left nephrolithiasis or other right renal stones are seen. There is no hydronephrosis or ureteral stone. The bladder is unremarkable for the degree of distention. Stomach/Bowel: Unremarkable stomach, unremarkable unopacified small bowel. An appendix is not seen in this patient. There is moderate retained stool ascending and transverse colon. Left-sided diverticulosis but no evidence of acute diverticulitis. Vascular/Lymphatic: Aortic atherosclerosis. No enlarged abdominal or pelvic lymph nodes. There are however, multiple bilateral enlarged inguinal chain nodes, largest on the right is 2.3 cm in short axis and the largest on the left is 2  cm in short axis. Etiology indeterminate. Reproductive: The uterus is intact. There is a 1.9 cm calcified subserosal fibroid to the right along the fundus. No adnexal mass is seen. Multiple pelvic phleboliths. Other: Small umbilical and inguinal fat hernias. Mild body wall edema. Stranding in the lower abdominal wall is seen and could be part of the bilateral body wall edema or could be due to cellulitis. There is no free fluid, free hemorrhage or free air, or incarcerated hernia. Musculoskeletal: Advanced L4-5 facet hypertrophy, acquired spinal canal stenosis and grade 1 L4-5 spondylolisthesis are noted present. Degenerative changes in visualized lower thoracic spine. No acute or other significant osseous findings. IMPRESSION: 1. 1.6 cm nodular opacity in the right lower lobe and 6 mm nodule in the right upper lobe. Prompt chest CT is recommended to assess for additional nodules. 2. Cardiomegaly with calcific CAD and aortic atherosclerosis. 3. Small hiatal hernia. 4. Constipation and diverticulosis. 5. Nonobstructive right-sided nephrolithiasis. No obstructing stones. 6. Bilateral inguinal chain adenopathy of uncertain etiology. 7. Lower abdominal wall stranding which could be part of the bilateral body wall edema or could be due to cellulitis. 8. Small umbilical and inguinal fat hernias. 9. Advanced L4-5 facet hypertrophy, acquired spinal canal stenosis and grade 1 L4-5 spondylolisthesis. 10. Mild hepatic steatosis. Aortic Atherosclerosis (ICD10-I70.0). Electronically Signed   By: Almira Bar M.D.   On: 09/06/2022 05:16   CT L-SPINE NO CHARGE  Result Date: 09/06/2022 CLINICAL DATA:  Abdominal/flank pain with stone suspected EXAM: CT Lumbar Spine with contrast TECHNIQUE: Technique: Multiplanar CT images of the lumbar spine were reconstructed from contemporary CT of the Abdomen and Pelvis. RADIATION DOSE REDUCTION: This exam was performed according to the departmental dose-optimization program which includes  automated exposure control, adjustment of the mA and/or kV according to patient size and/or use of iterative reconstruction technique. CONTRAST:  None additional COMPARISON:  None Available. FINDINGS: Segmentation: 5 lumbar type vertebrae. Alignment: Mild degenerative anterolisthesis at L4-5 and retrolisthesis at L5-S1. Vertebrae: No acute fracture or focal pathologic process. Paraspinal and other soft tissues: Reported separately Disc levels: T12- L1: Facet and costovertebral spurring eccentric to the left with mild foraminal encroachment. L1-L2: Mild disc bulging and facet spurring L2-L3: Mild disc bulging and facet spurring L3-L4: Moderate degenerative facet hypertrophy. Circumferential disc bulging. Moderate spinal and biforaminal stenosis L4-L5: Advanced facet osteoarthritis with spurring and anterolisthesis. The disc is narrowed and bulging. Advanced appearing spinal and biforaminal stenosis L5-S1:Moderate degenerative facet spurring asymmetric to the left. Mild disc bulging and mild left foraminal narrowing. IMPRESSION: 1. No acute finding. 2. Generalized lumbar spine degeneration especially affecting lower lumbar facets with L4-5 and L5-S1 anterolisthesis. 3. L4-5 high-grade spinal stenosis. 4. Foraminal impingement suspected at both L3-4 and L4-5. Electronically Signed  By: Tiburcio Pea M.D.   On: 09/06/2022 04:56   DG Chest Portable 1 View  Result Date: 09/06/2022 CLINICAL DATA:  Shortness of breath EXAM: PORTABLE CHEST 1 VIEW COMPARISON:  04/06/2013 FINDINGS: Cardiac shadow is mildly enlarged. Lungs are clear bilaterally. No bony abnormality is noted. IMPRESSION: No active disease. Electronically Signed   By: Alcide Clever M.D.   On: 09/06/2022 03:13    Procedures .Critical Care  Performed by: Zadie Rhine, MD Authorized by: Zadie Rhine, MD   Critical care provider statement:    Critical care time (minutes):  80   Critical care start time:  09/06/2022 2:30 AM   Critical care end  time:  09/06/2022 3:50 AM   Critical care time was exclusive of:  Separately billable procedures and treating other patients   Critical care was necessary to treat or prevent imminent or life-threatening deterioration of the following conditions:  CNS failure or compromise, respiratory failure and cardiac failure   Critical care was time spent personally by me on the following activities:  Examination of patient, review of old charts, re-evaluation of patient's condition, pulse oximetry, ordering and review of radiographic studies, ordering and review of laboratory studies, ordering and performing treatments and interventions, development of treatment plan with patient or surrogate, obtaining history from patient or surrogate, evaluation of patient's response to treatment and ventilator management   I assumed direction of critical care for this patient from another provider in my specialty: no     Care discussed with: admitting provider       Medications Ordered in ED Medications  diltiazem (CARDIZEM) 125 mg in dextrose 5% 125 mL (1 mg/mL) infusion (10 mg/hr Intravenous New Bag/Given 09/06/22 0513)  HYDROmorphone (DILAUDID) injection 1 mg (1 mg Intramuscular Given 09/06/22 0159)  iohexol (OMNIPAQUE) 300 MG/ML solution 100 mL (100 mLs Intravenous Contrast Given 09/06/22 0440)    ED Course/ Medical Decision Making/ A&P Clinical Course as of 09/06/22 0539  Mon Sep 06, 2022  0202 Patient presents for recurrent back pain and spasms.  Patient appears uncomfortable she refuses to get out of the chair.  She is tachycardic.  She was seen in urgent care back in June for back spasms but I do not see any recent spinal imaging.  Will treat her pain and then help her in the bed. [DW]  0228 Patient is now in the bed.  She is tachycardic and in A-fib.  She is taking Eliquis but was unclear why she was on this medicine.  She reports she was in [place] for over 8 months and just recently got back to [place].   Reports she was admitted in [place] but I am unable to find those records. Will treat her pain, start with labs and will likely need CT imaging [DW]  0319 Pt resting comfortably after pain meds-  placed on oxygen as the meds have made her drowsy.  CXR shows no signs of pna or CHF  Will proceed with CT imaging for back pain and then likely admit  Will treat her afib with RVR [DW]  0447 Patient still tachycardic in A-fib.  She is become more somnolent and short of breath.  Blood gas reveals hypercapnia.  Will trial patient on noninvasive ventilation.  Patient likely has underlying obstructive sleep apnea that was exacerbated by pain medication. [DW]  4098 Pt resting comfortably on bipap, awaiting CT results.  Pt is easily woke up [DW]  0531 Patient informed of results and need for admission.  Patient resting comfortably on noninvasive elation and is easily arousable. [DW]  1324 Discussed with Dr. Antionette Char for admission [DW]  [phone] Per radiology, patient will need CT chest due to lung nodules [DW]    Clinical Course User Index [DW] Zadie Rhine, MD                                 Medical Decision Making Amount and/or Complexity of Data Reviewed Labs: ordered. Radiology: ordered. ECG/medicine tests: ordered.  Risk Prescription drug management. Decision regarding hospitalization.   This patient presents to the ED for concern of back pain, this involves an extensive number of treatment options, and is a complaint that carries with it a high risk of complications and morbidity.  The differential diagnosis includes but is not limited to muscle strain, muscle spasm, discitis, epidural abscess, compression fracture, AAA, kidney stone, pyelonephritis  Comorbidities that complicate the patient evaluation: Patient's presentation is complicated by their history of diabetes, hypertension and morbid obesity  Social Determinants of Health: Patient's impaired access to primary care  increases  the complexity of managing their presentation  Additional history obtained: Additional history obtained from EMS  Records reviewed Care Everywhere/External Records  Lab Tests: I Ordered, and personally interpreted labs.  The pertinent results include: Mild leukocytosis  Imaging Studies ordered: I ordered imaging studies including X-ray chest   I independently visualized and interpreted imaging which showed cardiomegaly I agree with the radiologist interpretation CT imaging of lumbar spine shows spinal stenosis No acute findings on CT abdomen pelvis  Cardiac Monitoring: The patient was maintained on a cardiac monitor.  I personally viewed and interpreted the cardiac monitor which showed an underlying rhythm of:  Atrial Fibrillation  Medicines ordered and prescription drug management: I ordered medication including Dilaudid for pain Reevaluation of the patient after these medicines showed that the patient    improved   Critical Interventions:   placed on noninvasive ventilation  Consultations Obtained: I requested consultation with the admitting physician [facility] , and discussed  findings as well as pertinent plan - they recommend: Admit to progressive  Reevaluation: After the interventions noted above, I reevaluated the patient and found that they have :stayed the same  Complexity of problems addressed: Patient's presentation is most consistent with  acute presentation with potential threat to life or bodily function  Disposition: After consideration of the diagnostic results and the patient's response to treatment,  I feel that the patent would benefit from admission   .           Final Clinical Impression(s) / ED Diagnoses Final diagnoses:  Atrial fibrillation with RVR (HCC)  Acute respiratory failure with hypercapnia (HCC)  Spinal stenosis of lumbar region without neurogenic claudication  Pulmonary nodule    Rx / DC Orders ED Discharge Orders     None          Zadie Rhine, MD 09/06/22 [phone]

## 2022-09-06 NOTE — Progress Notes (Signed)
   09/06/22 2101  BiPAP/CPAP/SIPAP  $ Face Mask Medium Yes  BiPAP/CPAP/SIPAP Pt Type Adult  BiPAP/CPAP/SIPAP SERVO  Mask Type Full face mask  Mask Size Medium  Set Rate 10 breaths/min  Respiratory Rate 20 breaths/min  IPAP 16 cmH20  EPAP 6 cmH2O  Pressure Support 10 cmH20  FiO2 (%) 40 %  Minute Ventilation 10.7  Leak 71  Peak Inspiratory Pressure (PIP) 15  Tidal Volume (Vt) 569  Patient Home Equipment No  Auto Titrate No  Press High Alarm 25 cmH2O  Safety Check Completed by RT for Home Unit Yes, no issues noted

## 2022-09-06 NOTE — H&P (Addendum)
History and Physical    Patient: Linda Chase NWG:[id number] DOB: 05-30-1955 DOA: 09/06/2022 DOS: the patient was seen and examined on 09/06/2022 PCP: Rometta Emery, MD  Patient coming from: transfer from [facility]  Chief Complaint:  Chief Complaint  Patient presents with   Back Pain   HPI: Linda Chase is a 67 y.o. female with medical history significant of hypertension, paroxysmal atrial fibrillation on chronic anticoagulation,  diabetes mellitus type 2, cerebral aneurysm, and morbid obesity who present with complaints of lower back pain.  History is limited from the patient as she is currently on BiPAP.  Son tries to provide additional history.  Pain started while she was out of town last month.  She went to [place] via train and came back possibly at the beginning of this month.  She reports pain was severe and she was having associated spasms.  She has been taking muscle relaxants and naproxen for the symptoms not relieved.  Noted pain was worsening with laying down and is hard for her to breathe due to the pain when trying to take deep breath.  She reported associated symptoms of urinary incontinence related with pain and difficulty ambulating.  Denies any recent trauma, falls, cough, fever, vomiting, or diarrhea.  She reports that she has been taking her Eliquis as prescribed.    In the emergency department patient was noted to be afebrile with heart rates into the 130s in atrial fibrillation, blood pressures maintained, O2 saturations as low as 81% with improvement on 3 L of nasal cannula oxygen.  Venous blood gas noted pH 7.234 with pCO2 80.2, and pO2 29.  Patient was placed on BiPAP.  Labs significant for WBC 11 and hemoglobin 11.  Chest x-ray noted no acute abnormality.  CT scan of the abdomen and pelvis noted 1.6 nodular opacity in the right lower lobe and 6 mm nodule in the right upper lobe, cardiomegaly with calcified CAD and aortic atherosclerosis, small  hiatal hernia, constipation and diverticulosis, lower abdominal wall stranding which could be part of the bilateral body wall edema or cellulitis, nonobstructive right nephrolithiasis, advanced L4-L5 facet hypertrophy with acquired spinal canal stenosis and grade 1 L4-L5 spondylolisthesis, and small focal and inguinal fat hernias.  Patient has been given DuoNeb breathing treatment, Dilaudid 1 mg IM, and started on Cardizem drip  Review of Systems: As mentioned in the history of present illness. All other systems reviewed and are negative. Past Medical History:  Diagnosis Date   Arthritis    Cerebral aneurysm    x2   Depression    Diabetes mellitus    Hypertension    Past Surgical History:  Procedure Laterality Date   BRAIN SURGERY     double aneurysm   Social History:  reports that she has been smoking cigarettes. She has never used smokeless tobacco. She reports that she does not drink alcohol and does not use drugs.  Allergies  Allergen Reactions   Hydrocodone Nausea And Vomiting    Family History  Family history unknown: Yes    Prior to Admission medications   Medication Sig Start Date End Date Taking? Authorizing Provider  apixaban (ELIQUIS) 5 MG TABS tablet Take 5 mg by mouth 2 (two) times daily.   Yes [provider]  atorvastatin (LIPITOR) 20 MG tablet Take 20 mg by mouth daily.   Yes [provider]  gabapentin (NEURONTIN) 300 MG capsule Take 300 mg by mouth at bedtime.   Yes [provider]  metoprolol tartrate (LOPRESSOR) 25 MG tablet Take 25 mg by mouth 2 (two) times daily.   Yes [provider]  potassium chloride (KLOR-CON) 10 MEQ tablet  11/28/18  Yes [provider]  amLODipine (NORVASC) 5 MG tablet Take 1 tablet (5 mg total) by mouth daily. Patient taking differently: Take 5 mg by mouth every evening. 02/05/12   Catarina Hartshorn, MD  Calcium-Phosphorus-Vitamin D (CALCIUM/VITAMIN D3/ADULT GUMMY PO) GUMMY VITAMIN D 2000 06/15/17    [provider]  cholecalciferol (VITAMIN D) 1000 units tablet Take 2,000 Units by mouth daily. Gummies    [provider]  hydrochlorothiazide (HYDRODIURIL) 25 MG tablet Take 25 mg by mouth every morning. 07/31/15   [provider]  metFORMIN (GLUCOPHAGE) 1000 MG tablet Take 1,000 mg by mouth 2 (two) times daily with a meal.    [provider]  UNABLE TO FIND ADULT INCONTINENCE UNDERWEAR X 06/15/17   [provider]  vitamin E 400 UNIT capsule  06/15/17   [provider]    Physical Exam: Vitals:   09/06/22 0659 09/06/22 0700 09/06/22 0730 09/06/22 0925  BP:  110/88 102/83 121/87  Pulse:   78 99  Resp:  14 15 (!) 25  Temp:    98.3 F (36.8 C)  TempSrc:    Axillary  SpO2:  98% 100% 99%  Weight: (!) 148.6 kg   (!) 140.6 kg  Height:    5\' 4"  (1.626 m)   Constitutional: Elderly female who appears to be no acute distress but Eyes: PERRL, lids and conjunctivae normal ENMT: Mucous membranes are dry. Posterior pharynx clear of any exudate or lesions.  Neck: normal, supple  Respiratory: Decreased overall aeration with rales present.  No significant wheezing appreciated at this time. Cardiovascular: Irregular irregular.  +1 pitting lower extremity edema. 2+ pedal pulses.   Abdomen: no tenderness, no masses palpated. No hepatosplenomegaly. Bowel sounds positive.  Musculoskeletal: no clubbing / cyanosis. No joint deformity upper and lower extremities. Good ROM, no contractures.   Skin: Warm appears to be venous stasis changes to the lower extremities with no weeping wounds noted at this time. Neurologic: CN 2-12 grossly intact.  Patient appears able to move all extremities. Psychiatric: Normal judgment and insight. Alert and oriented x 3. Normal mood.   Data Reviewed:  EKG revealed atrial fibrillation at 133 bpm. Assessment and Plan: Acute respiratory failure with hypercapnia  Patient was noted to have difficulty breathing due to her back  pain.  Initial venous blood gas with pH 7.234 with pCO2 80.2, and PO2 29.  She had been on BiPAP.  Patient still noted to be lethargic on physical exam at this time but we will awaken with repeated stimuli and answer questions appropriately.  Lower suspicion for pulmonary embolism as patient reports compliance with Eliquis.  Suspect patient has underlying OSA/OHS in addition to back issues precipitating her shortness of breath. -Admit to a progressive bed -Continuous pulse oximetry with oxygen maintain O2 saturation greater than 92%. -Continue BiPAP -Check ABG and D-dimer -DuoNebs as needed for shortness of breath/wheezing  Lower back pain Acute on chronic.  Patient reports having significant lower back pain with spasms in the lower back that acutely worsened with inability to.  Noted intermittent reports of urinary incontinence CT noted advanced L4-L5 facet hypertrophy with acquired spinal canal stenosis and grade 1 L4-L5 spondylolisthesis.  Patient had been using naproxen and muscle relaxants without improvement in symptoms. -Dilaudid IV as needed for pain -Neurosurgery consulted, will follow-up for any  further recommendations  Leukocytosis Acute.  WBC elevated at 11. -Check CRP and procalcitonin. -Check urinalysis -Recheck CBC tomorrow morning -Will consider starting empiric antibiotics if patient develops fever or more clear source of infection appreciated  Lower abdominal wall swelling  Acute.  CT imaging had noted lower abdominal wall stranding thought to either possibly be cellulitis or associated with lower extremity edema. -Strict intake and output -Check BNP  Paroxysmal atrial fibrillation with RVR on chronic anticoagulation On admission patient was noted to be in atrial fibrillation with heart rates into the 130s.  Patient has been started on a Cardizem drip.  Heart rates currently well-controlled on Cardizem drip. -Continue Cardizem drip -Goal potassium at least 4 and magnesium  at least 2 -Check echocardiogram -Lovenox per pharmacy and resume oral Eliquis once able  Essential hypertension Blood pressures are currently maintained. -Consider resuming home blood pressure regimen once able.  Diabetes mellitus type 2, without long-term use of insulin On admission glucose was noted to be 129.  Home medication regimen includes metformin and Januvia -Hypoglycemic protocols -Hold metformin -Resume Januvia once able  Abnormal CT abdomen and pelvis CT also noted 1.6 nodular opacity in the right lower lobe and 6 mm nodule in the right upper lobe, nonobstructive right sided nephrolithiasis, small focal and inguinal fat hernias, and cardiomegaly with calcified coronary artery disease and aortic atherosclerosis. -Consider additional workup or recommend outpatient follow-up for issues as noted above  Morbid obesity BMI 53.21 kg/m.  Patient likely has obstructive sleep apnea.   DVT prophylaxis: Lovenox and will transition back to Eliquis once able Advance Care Planning:   Code Status: Full Code    Consults: Neurosurgery   Family Communication: Patient's son updated over the phone  Severity of Illness: The appropriate patient status for this patient is INPATIENT. Inpatient status is judged to be reasonable and necessary in order to provide the required intensity of service to ensure the patient's safety. The patient's presenting symptoms, physical exam findings, and initial radiographic and laboratory data in the context of their chronic comorbidities is felt to place them at high risk for further clinical deterioration. Furthermore, it is not anticipated that the patient will be medically stable for discharge from the hospital within 2 midnights of admission.   * I certify that at the point of admission it is my clinical judgment that the patient will require inpatient hospital care spanning beyond 2 midnights from the point of admission due to high intensity of service,  high risk for further deterioration and high frequency of surveillance required.*  Author: Clydie Braun, MD 09/06/2022 9:45 AM  For on call review [email or web address].

## 2022-09-06 NOTE — Plan of Care (Signed)
  Problem: Education: Goal: Knowledge of General Education information will improve Description: Including pain rating scale, medication(s)/side effects and non-pharmacologic comfort measures Outcome: Not Progressing   Problem: Health Behavior/Discharge Planning: Goal: Ability to manage health-related needs will improve Outcome: Not Progressing   Problem: Clinical Measurements: Goal: Will remain free from infection Outcome: Progressing   Problem: Pain Managment: Goal: General experience of comfort will improve Outcome: Progressing   Problem: Pain Managment: Goal: General experience of comfort will improve Outcome: Progressing   Problem: Safety: Goal: Ability to remain free from injury will improve Outcome: Progressing

## 2022-09-06 NOTE — ED Notes (Signed)
O2 via [place] at 3L flow rate applied to pt after admin of IM dilaudid, O2 saturations low 80s before O2 applied. Improved to 94%.

## 2022-09-06 NOTE — ED Notes (Signed)
   09/06/22 0506  BiPAP/CPAP/SIPAP  $ Non-Invasive Ventilator  Non-Invasive Vent Set Up;Non-Invasive Vent Initial  $ Face Mask Medium Yes  BiPAP/CPAP/SIPAP Pt Type Adult  BiPAP/CPAP/SIPAP SERVO  Mask Type Full face mask  Mask Size Medium  Set Rate 16 breaths/min  Respiratory Rate 17 breaths/min  IPAP 13 cmH20  EPAP 6 cmH2O  FiO2 (%) 40 %  Minute Ventilation 9.1  Leak 17  Peak Inspiratory Pressure (PIP) 13  Tidal Volume (Vt) 471  Auto Titrate No  Press High Alarm 30 cmH2O  Press Low Alarm 16 cmH2O

## 2022-09-06 NOTE — Consult Note (Signed)
Reason for Consult: Back pain Referring Physician: Medicine  Linda Chase is an 67 y.o. female.  HPI: 67 year old female admitted with respiratory failure.  Patient also complains of lumbar pain.  Patient currently on BiPAP and is quite somnolent.  She is unable to participate in her history.  Past Medical History:  Diagnosis Date   Arthritis    Cerebral aneurysm    x2   Depression    Diabetes mellitus    Hypertension     Past Surgical History:  Procedure Laterality Date   BRAIN SURGERY     double aneurysm    Family History  Family history unknown: Yes    Social History:  reports that she has been smoking cigarettes. She has never used smokeless tobacco. She reports that she does not drink alcohol and does not use drugs.  Allergies:  Allergies  Allergen Reactions   Hydrocodone Nausea And Vomiting    Medications: I have reviewed the patient's current medications.  Results for orders placed or performed during the hospital encounter of 09/06/22 (from the past 48 hour(s))  CBC with Differential     Status: Abnormal   Collection Time: 09/06/22  2:37 AM  Result Value Ref Range   WBC 11.0 (H) 4.0 - 10.5 K/uL   RBC 4.42 3.87 - 5.11 MIL/uL   Hemoglobin 11.0 (L) 12.0 - 15.0 g/dL   HCT 29.5 28.4 - 13.2 %   MCV 83.9 80.0 - 100.0 fL   MCH 24.9 (L) 26.0 - 34.0 pg   MCHC 29.6 (L) 30.0 - 36.0 g/dL   RDW 44.0 10.2 - 72.5 %   Platelets 355 150 - 400 K/uL   nRBC 0.0 0.0 - 0.2 %   Neutrophils Relative % 81 %   Neutro Abs 8.9 (H) 1.7 - 7.7 K/uL   Lymphocytes Relative 11 %   Lymphs Abs 1.3 0.7 - 4.0 K/uL   Monocytes Relative 6 %   Monocytes Absolute 0.6 0.1 - 1.0 K/uL   Eosinophils Relative 2 %   Eosinophils Absolute 0.2 0.0 - 0.5 K/uL   Basophils Relative 0 %   Basophils Absolute 0.0 0.0 - 0.1 K/uL   Immature Granulocytes 0 %   Abs Immature Granulocytes 0.03 0.00 - 0.07 K/uL    Comment: Performed at [facility], [address] [place],  [place] [address]  Comprehensive metabolic panel     Status: Abnormal   Collection Time: 09/06/22  2:37 AM  Result Value Ref Range   Sodium 138 135 - 145 mmol/L   Potassium 3.8 3.5 - 5.1 mmol/L   Chloride 101 98 - 111 mmol/L   CO2 31 22 - 32 mmol/L   Glucose, Bld 129 (H) 70 - 99 mg/dL    Comment: Glucose reference range applies only to samples taken after fasting for at least 8 hours.   BUN 12 8 - 23 mg/dL   Creatinine, Ser 0.34 0.44 - 1.00 mg/dL   Calcium 9.3 8.9 - 74.2 mg/dL   Total Protein 7.8 6.5 - 8.1 g/dL   Albumin 3.8 3.5 - 5.0 g/dL   AST 16 15 - 41 U/L   ALT 12 0 - 44 U/L   Alkaline Phosphatase 68 38 - 126 U/L   Total Bilirubin 0.4 0.3 - 1.2 mg/dL   GFR, Estimated >59 >56 mL/min    Comment: (NOTE) Calculated using the CKD-EPI Creatinine Equation (2021)    Anion gap 6 5 - 15    Comment: Performed at [facility], [address], [place], [place] [address]  I-Stat venous blood gas, [facility] [place] [facility], [facility], [facility])     Status: Abnormal   Collection Time: 09/06/22  4:42 AM  Result Value Ref Range   pH, Ven 7.234 (L) 7.25 - 7.43   pCO2, Ven 80.2 (HH) 44 - 60 mmHg   pO2, Ven 29 (LL) 32 - 45 mmHg   Bicarbonate 34.0 (H) 20.0 - 28.0 mmol/L   TCO2 36 (H) 22 - 32 mmol/L   O2 Saturation 41 %   Acid-Base Excess 4.0 (H) 0.0 - 2.0 mmol/L   Sodium 139 135 - 145 mmol/L   Potassium 3.9 3.5 - 5.1 mmol/L   Calcium, Ion 1.20 1.15 - 1.40 mmol/L   HCT 36.0 36.0 - 46.0 %   Hemoglobin 12.2 12.0 - 15.0 g/dL   Patient temperature 60.4 F    Collection site IV start    Drawn by Nurse    Sample type VENOUS    Comment NOTIFIED PHYSICIAN   Blood gas, arterial     Status: Abnormal   Collection Time: 09/06/22 12:37 PM  Result Value Ref Range   pH, Arterial 7.32 (L) 7.35 - 7.45   pCO2 arterial 61 (H) 32 - 48 mmHg   pO2, Arterial 118 (H) 83 - 108 mmHg   Bicarbonate 31.4 (H) 20.0 - 28.0 mmol/L   Acid-Base Excess 3.6 (H) 0.0 - 2.0 mmol/L   O2 Saturation 99.5 %   Patient temperature 37.0     Collection site RIGHT RADIAL    Drawn by [id number]    Allens test (pass/fail) PASS PASS    Comment: Performed at [facility], [address], [place], [place] [address]    CT ABDOMEN PELVIS W CONTRAST  Result Date: 09/06/2022 CLINICAL DATA:  Abdominal and flank pain. EXAM: CT ABDOMEN AND PELVIS WITH CONTRAST TECHNIQUE: Multidetector CT imaging of the abdomen and pelvis was performed using the standard protocol following bolus administration of intravenous contrast. RADIATION DOSE REDUCTION: This exam was performed according to the departmental dose-optimization program which includes automated exposure control, adjustment of the mA and/or kV according to patient size and/or use of iterative reconstruction technique. CONTRAST:  OMNIPAQUE IOHEXOL 300 MG/ML  SOLN COMPARISON:  Limited comparison is available with CTA chest 02/01/2012. Portable chest has also been performed today. FINDINGS: Lower chest: The heart is moderately enlarged. There is no pericardial effusion. Small hiatal hernia. There are three-vessel coronary calcifications greatest in the LAD. Scattered calcifications in the aortic valve leaflets. There are posterior atelectatic changes. Newly noted in the right lower lobe on 4:13 adjacent the posterior crest of the hemidiaphragm, there is a 1.6 cm nodular opacity. Also, at base of the right upper lobe on 4:3 there is a noncalcified 6 mm nodule not seen previously. Prompt chest CT is recommended to assess for additional nodules. Remaining lung bases are clear. Hepatobiliary: Respiratory motion limits fine detail. No liver mass is seen through the breathing motion. The liver is 21 cm length and mildly steatotic. The gallbladder and bile ducts are unremarkable. Pancreas: No abnormality is seen through breathing motion. Spleen: No abnormality is seen through the breathing motion. No splenomegaly. Adrenals/Urinary Tract: Also with limited fine detail in the kidneys due to breathing motion. No  focal abnormality is suspected in the adrenal glands and kidneys. There is a 3 mm caliceal stone in the inferior pole of the right kidney. No left nephrolithiasis or other right renal stones are seen. There is no hydronephrosis or ureteral  stone. The bladder is unremarkable for the degree of distention. Stomach/Bowel: Unremarkable stomach, unremarkable unopacified small bowel. An appendix is not seen in this patient. There is moderate retained stool ascending and transverse colon. Left-sided diverticulosis but no evidence of acute diverticulitis. Vascular/Lymphatic: Aortic atherosclerosis. No enlarged abdominal or pelvic lymph nodes. There are however, multiple bilateral enlarged inguinal chain nodes, largest on the right is 2.3 cm in short axis and the largest on the left is 2 cm in short axis. Etiology indeterminate. Reproductive: The uterus is intact. There is a 1.9 cm calcified subserosal fibroid to the right along the fundus. No adnexal mass is seen. Multiple pelvic phleboliths. Other: Small umbilical and inguinal fat hernias. Mild body wall edema. Stranding in the lower abdominal wall is seen and could be part of the bilateral body wall edema or could be due to cellulitis. There is no free fluid, free hemorrhage or free air, or incarcerated hernia. Musculoskeletal: Advanced L4-5 facet hypertrophy, acquired spinal canal stenosis and grade 1 L4-5 spondylolisthesis are noted present. Degenerative changes in visualized lower thoracic spine. No acute or other significant osseous findings. IMPRESSION: 1. 1.6 cm nodular opacity in the right lower lobe and 6 mm nodule in the right upper lobe. Prompt chest CT is recommended to assess for additional nodules. 2. Cardiomegaly with calcific CAD and aortic atherosclerosis. 3. Small hiatal hernia. 4. Constipation and diverticulosis. 5. Nonobstructive right-sided nephrolithiasis. No obstructing stones. 6. Bilateral inguinal chain adenopathy of uncertain etiology. 7. Lower  abdominal wall stranding which could be part of the bilateral body wall edema or could be due to cellulitis. 8. Small umbilical and inguinal fat hernias. 9. Advanced L4-5 facet hypertrophy, acquired spinal canal stenosis and grade 1 L4-5 spondylolisthesis. 10. Mild hepatic steatosis. Aortic Atherosclerosis (ICD10-I70.0). Electronically Signed   By: Almira Bar M.D.   On: 09/06/2022 05:16   CT L-SPINE NO CHARGE  Result Date: 09/06/2022 CLINICAL DATA:  Abdominal/flank pain with stone suspected EXAM: CT Lumbar Spine with contrast TECHNIQUE: Technique: Multiplanar CT images of the lumbar spine were reconstructed from contemporary CT of the Abdomen and Pelvis. RADIATION DOSE REDUCTION: This exam was performed according to the departmental dose-optimization program which includes automated exposure control, adjustment of the mA and/or kV according to patient size and/or use of iterative reconstruction technique. CONTRAST:  None additional COMPARISON:  None Available. FINDINGS: Segmentation: 5 lumbar type vertebrae. Alignment: Mild degenerative anterolisthesis at L4-5 and retrolisthesis at L5-S1. Vertebrae: No acute fracture or focal pathologic process. Paraspinal and other soft tissues: Reported separately Disc levels: T12- L1: Facet and costovertebral spurring eccentric to the left with mild foraminal encroachment. L1-L2: Mild disc bulging and facet spurring L2-L3: Mild disc bulging and facet spurring L3-L4: Moderate degenerative facet hypertrophy. Circumferential disc bulging. Moderate spinal and biforaminal stenosis L4-L5: Advanced facet osteoarthritis with spurring and anterolisthesis. The disc is narrowed and bulging. Advanced appearing spinal and biforaminal stenosis L5-S1:Moderate degenerative facet spurring asymmetric to the left. Mild disc bulging and mild left foraminal narrowing. IMPRESSION: 1. No acute finding. 2. Generalized lumbar spine degeneration especially affecting lower lumbar facets with L4-5  and L5-S1 anterolisthesis. 3. L4-5 high-grade spinal stenosis. 4. Foraminal impingement suspected at both L3-4 and L4-5. Electronically Signed   By: Tiburcio Pea M.D.   On: 09/06/2022 04:56   DG Chest Portable 1 View  Result Date: 09/06/2022 CLINICAL DATA:  Shortness of breath EXAM: PORTABLE CHEST 1 VIEW COMPARISON:  04/06/2013 FINDINGS: Cardiac shadow is mildly enlarged. Lungs are clear bilaterally. No bony abnormality is noted.  IMPRESSION: No active disease. Electronically Signed   By: Alcide Clever M.D.   On: 09/06/2022 03:13    Review of systems not obtained due to patient factors. Blood pressure 131/65, pulse 78, temperature 97.6 F (36.4 C), temperature source Axillary, resp. rate 19, height 5\' 4"  (1.626 m), weight (!) 140.6 kg, SpO2 97%. Patient was quite somnolent.  She will awaken intermittently and answer some simple questions.  She will follow commands bilaterally.  Examination head ears eyes nose and throat is unremarkable aside from obesity.  Neurologically her cranial nerve function appears intact bilaterally.  She has good motor strength in both lower extremities.  Sensory examination is limited by her level of consciousness.  Reflexes are hypoactive.  Assessment/Plan: CT scan of her lumbar spine demonstrates evidence of significant lower lumbar degenerative disease with early degenerative spondylolisthesis and marked facet arthropathy at L4-5.  There is likely significant lumbar stenosis ongoing.  She also has significant degenerative disease and some facet arthropathy at L5-S1.  No evidence of any acute findings.  Nothing to suggest any type of infectious pathology.  At this point all evaluation is severely limited by her respiratory status.  Once this improves a more full assessment can be made of her lumbar spine.  I will likely recommend mobilization with physical therapy and anti-inflammatory medications either steroids or nonsteroidal anti-inflammatory medications depending on  her situation.  No indications for any urgent intervention.  Sherilyn Cooter A Arihant Pennings 09/06/2022, 1:32 PM

## 2022-09-06 NOTE — ED Notes (Signed)
Patient transported to CT 

## 2022-09-06 NOTE — ED Notes (Signed)
[facility] at [facility] will send transport for Bed ready at [facility] 2C RM#[id number].-ABB(NS)

## 2022-09-06 NOTE — Progress Notes (Signed)
Echocardiogram 2D Echocardiogram has been attempted. Pt care in process. Will try again as time permits.  Linda Chase 09/06/2022, 2:18 PM

## 2022-09-06 NOTE — ED Provider Notes (Signed)
Pt resting comfortably Easily arousable but still with some SOB Will add on Maree Erie, MD 09/06/22 [phone]

## 2022-09-07 ENCOUNTER — Inpatient Hospital Stay (HOSPITAL_COMMUNITY): Payer: Medicare HMO

## 2022-09-07 DIAGNOSIS — J9602 Acute respiratory failure with hypercapnia: Secondary | ICD-10-CM | POA: Diagnosis not present

## 2022-09-07 DIAGNOSIS — I4891 Unspecified atrial fibrillation: Secondary | ICD-10-CM | POA: Diagnosis not present

## 2022-09-07 LAB — GLUCOSE, CAPILLARY
Glucose-Capillary: 126 mg/dL — ABNORMAL HIGH (ref 70–99)
Glucose-Capillary: 149 mg/dL — ABNORMAL HIGH (ref 70–99)
Glucose-Capillary: 161 mg/dL — ABNORMAL HIGH (ref 70–99)
Glucose-Capillary: 130 mg/dL — ABNORMAL HIGH (ref 70–99)

## 2022-09-07 LAB — CBC
HCT: 36.2 % (ref 36.0–46.0)
Hemoglobin: 10.1 g/dL — ABNORMAL LOW (ref 12.0–15.0)
MCH: 24 pg — ABNORMAL LOW (ref 26.0–34.0)
MCHC: 27.9 g/dL — ABNORMAL LOW (ref 30.0–36.0)
MCV: 86 fL (ref 80.0–100.0)
Platelets: 290 10*3/uL (ref 150–400)
RBC: 4.21 MIL/uL (ref 3.87–5.11)
RDW: 15 % (ref 11.5–15.5)
WBC: 9.9 10*3/uL (ref 4.0–10.5)
nRBC: 0 % (ref 0.0–0.2)

## 2022-09-07 LAB — BASIC METABOLIC PANEL
Anion gap: 9 (ref 5–15)
BUN: 8 mg/dL (ref 8–23)
CO2: 29 mmol/L (ref 22–32)
Calcium: 8 mg/dL — ABNORMAL LOW (ref 8.9–10.3)
Chloride: 99 mmol/L (ref 98–111)
Creatinine, Ser: 0.8 mg/dL (ref 0.44–1.00)
GFR, Estimated: >60 mL/min (ref >60)
Glucose, Bld: 167 mg/dL — ABNORMAL HIGH (ref 70–99)
Potassium: 3.8 mmol/L (ref 3.5–5.1)
Sodium: 137 mmol/L (ref 135–145)

## 2022-09-07 LAB — HEMOGLOBIN A1C
Hgb A1c MFr Bld: 7.8 % — ABNORMAL HIGH (ref 4.8–5.6)
Mean Plasma Glucose: 177.16 mg/dL

## 2022-09-07 LAB — ECHOCARDIOGRAM COMPLETE
Area-P 1/2: 3.99 cm2
Calc EF: 57.4 %
Est EF: 50
Height: 64 in
S' Lateral: 3.5 cm
Single Plane A2C EF: 59.7 %
Single Plane A4C EF: 53.9 %
Weight: 4803.2 oz

## 2022-09-07 MED ORDER — REVEFENACIN 175 MCG/3ML IN SOLN
175.0000 ug | Freq: Every day | RESPIRATORY_TRACT | Status: DC
  Administered 2022-09-07 – 2022-09-21 (×13): 175 ug via RESPIRATORY_TRACT
  Filled 2022-09-07 (×16): qty 3

## 2022-09-07 MED ORDER — TIOTROPIUM BROMIDE MONOHYDRATE 18 MCG IN CAPS: 18.0000 ug | ORAL_CAPSULE | Freq: Every day | RESPIRATORY_TRACT | Status: DC

## 2022-09-07 MED ORDER — INSULIN ASPART 100 UNIT/ML IJ SOLN
0.0000 [IU] | Freq: Every day | INTRAMUSCULAR | Status: DC
  Administered 2022-09-09 – 2022-09-21 (×5): 2 [IU] via SUBCUTANEOUS

## 2022-09-07 MED ORDER — GABAPENTIN 300 MG PO CAPS
300.0000 mg | ORAL_CAPSULE | Freq: Every day | ORAL | Status: DC
  Administered 2022-09-07 – 2022-09-21 (×15): 300 mg via ORAL
  Filled 2022-09-07 (×15): qty 1

## 2022-09-07 MED ORDER — VITAMIN D 25 MCG (1000 UNIT) PO TABS
2000.0000 [IU] | ORAL_TABLET | Freq: Every day | ORAL | Status: DC
  Administered 2022-09-07 – 2022-09-22 (×16): 2000 [IU] via ORAL
  Filled 2022-09-07 (×16): qty 2

## 2022-09-07 MED ORDER — FUROSEMIDE 40 MG PO TABS
40.0000 mg | ORAL_TABLET | Freq: Every day | ORAL | Status: DC
  Administered 2022-09-07 – 2022-09-09 (×3): 40 mg via ORAL
  Filled 2022-09-07 (×3): qty 1

## 2022-09-07 MED ORDER — ATORVASTATIN CALCIUM 10 MG PO TABS
20.0000 mg | ORAL_TABLET | Freq: Every day | ORAL | Status: DC
  Administered 2022-09-07 – 2022-09-22 (×16): 20 mg via ORAL
  Filled 2022-09-07 (×16): qty 2

## 2022-09-07 MED ORDER — APIXABAN 5 MG PO TABS
5.0000 mg | ORAL_TABLET | Freq: Two times a day (BID) | ORAL | Status: AC
  Administered 2022-09-07 – 2022-09-11 (×9): 5 mg via ORAL
  Filled 2022-09-07 (×9): qty 1

## 2022-09-07 MED ORDER — ALBUTEROL SULFATE (2.5 MG/3ML) 0.083% IN NEBU: 3.0000 mL | INHALATION_SOLUTION | RESPIRATORY_TRACT | Status: DC | PRN

## 2022-09-07 MED ORDER — OXYCODONE HCL 5 MG PO TABS
5.0000 mg | ORAL_TABLET | Freq: Four times a day (QID) | ORAL | Status: DC | PRN
  Administered 2022-09-07 – 2022-09-21 (×24): 5 mg via ORAL
  Filled 2022-09-07 (×24): qty 1

## 2022-09-07 MED ORDER — METOPROLOL SUCCINATE ER 50 MG PO TB24
50.0000 mg | ORAL_TABLET | Freq: Every day | ORAL | Status: DC
  Administered 2022-09-07: 50 mg via ORAL
  Filled 2022-09-07: qty 1

## 2022-09-07 MED ORDER — INSULIN ASPART 100 UNIT/ML IJ SOLN
0.0000 [IU] | Freq: Three times a day (TID) | INTRAMUSCULAR | Status: DC
  Administered 2022-09-07: 2 [IU] via SUBCUTANEOUS
  Administered 2022-09-08 (×2): 3 [IU] via SUBCUTANEOUS
  Administered 2022-09-08: 2 [IU] via SUBCUTANEOUS
  Administered 2022-09-09: 5 [IU] via SUBCUTANEOUS
  Administered 2022-09-09: 2 [IU] via SUBCUTANEOUS
  Administered 2022-09-09: 3 [IU] via SUBCUTANEOUS
  Administered 2022-09-10 (×2): 5 [IU] via SUBCUTANEOUS
  Administered 2022-09-11: 2 [IU] via SUBCUTANEOUS
  Administered 2022-09-11 (×2): 5 [IU] via SUBCUTANEOUS
  Administered 2022-09-12 (×2): 3 [IU] via SUBCUTANEOUS
  Administered 2022-09-12: 2 [IU] via SUBCUTANEOUS
  Administered 2022-09-13 (×2): 3 [IU] via SUBCUTANEOUS
  Administered 2022-09-13: 5 [IU] via SUBCUTANEOUS
  Administered 2022-09-14: 2 [IU] via SUBCUTANEOUS
  Administered 2022-09-14 – 2022-09-15 (×5): 3 [IU] via SUBCUTANEOUS
  Administered 2022-09-16: 2 [IU] via SUBCUTANEOUS
  Administered 2022-09-16: 3 [IU] via SUBCUTANEOUS
  Administered 2022-09-16: 5 [IU] via SUBCUTANEOUS
  Administered 2022-09-17: 2 [IU] via SUBCUTANEOUS
  Administered 2022-09-17 – 2022-09-18 (×3): 3 [IU] via SUBCUTANEOUS
  Administered 2022-09-18: 8 [IU] via SUBCUTANEOUS
  Administered 2022-09-19: 2 [IU] via SUBCUTANEOUS
  Administered 2022-09-19 – 2022-09-20 (×2): 3 [IU] via SUBCUTANEOUS
  Administered 2022-09-20: 5 [IU] via SUBCUTANEOUS
  Administered 2022-09-20 – 2022-09-21 (×4): 3 [IU] via SUBCUTANEOUS
  Administered 2022-09-22: 2 [IU] via SUBCUTANEOUS
  Administered 2022-09-22: 3 [IU] via SUBCUTANEOUS

## 2022-09-07 MED ORDER — APIXABAN 5 MG PO TABS: 5.0000 mg | ORAL_TABLET | Freq: Two times a day (BID) | ORAL | Status: DC

## 2022-09-07 NOTE — Progress Notes (Signed)
Pt noted that she is missing one  of her  earring.  Searched on her bed,floor belongings but none in there. Pt came from [facility]. CN was made  aware.

## 2022-09-07 NOTE — Progress Notes (Signed)
Assisted to bed for echo. Started screaming for pain. Claimed she cannot lay down in bed due to back.pain.  Md made aware with order. Oxy . Given. Continue to monitor.

## 2022-09-07 NOTE — Evaluation (Signed)
Physical Therapy Evaluation Patient Details Name: Linda Chase MRN: [id number] DOB: 1955-02-08 Today's Date: 09/07/2022  History of Present Illness  Linda Chase is a 67 y.o. female who presented to [facility] ED with complaints of lower back pain however due to respiratory difficulty placed on biPap and then transferred to [facility]. HR also in 130s in a-fib. PMH: HTN, paroxysmal a-fib on chronic anticoagulation, DM II, cerebral aneurysm, and morbid obesity   Clinical Impression  Pt admitted with above. Pt with decreased insight to medical condition including her a-fib with high heart rate and her current need for O2. Pt also with back pain and would benefit from RW for ambulation to decrease fall risk, help with back pain management, and for energy conservation due to impaired respiratory status however pt refuses all equip recommendations including O2 supplementation due to "They'll just get in my way." Pt with significant more stability both medically and functional with use of RW. Without use of RW pt with decreased step length and height, wider base of support, increased WOB with SPO2 dropping to 82% on 4LO2 via [place] and HR increased to 150s bpm compared to 130s bpm when using RW. Pt declining any recommendation of post d/c therapy or DME. Acute PT to cont to follow in the hospital. Despite post d/c recommendation for PT pt declining and reports "I"ll be going home."        If plan is discharge home, recommend the following: A little help with bathing/dressing/bathroom;A little help with walking and/or transfers;Assist for transportation;Help with stairs or ramp for entrance   Can travel by private vehicle   Yes    Equipment Recommendations Rolling walker (2 wheels) (bariatric, however pt refusing)  Recommendations for Other Services       Functional Status Assessment Patient has had a recent decline in their functional status and demonstrates the ability to make  significant improvements in function in a reasonable and predictable amount of time.     Precautions / Restrictions Precautions Precautions: Fall Precaution Comments: watch SpO2 and HR Restrictions Weight Bearing Restrictions: No      Mobility  Bed Mobility               General bed mobility comments: pt received sitting up in chair due to "I can't breath and my L hip hurts when I lay down."    Transfers Overall transfer level: Needs assistance Equipment used: Rolling walker (2 wheels) Transfers: Sit to/from Stand Sit to Stand: Mod assist           General transfer comment: modA to power up due to body habitus and generalized weakness, pt used rocking momentum and attempted to push up from arm rests however unable to clear bottom, PT provided modA to power up and patient reaching out for RW, increased time required, completed 3 sit to stand    Ambulation/Gait Ambulation/Gait assistance: Min assist, +2 safety/equipment, Mod assist Gait Distance (Feet): 5 Feet (x1, 60'x1) Assistive device: Rolling walker (2 wheels), None Gait Pattern/deviations: Step-to pattern, Decreased stride length, Wide base of support, Trunk flexed, Antalgic Gait velocity: dec Gait velocity interpretation: <1.31 ft/sec, indicative of household ambulator   General Gait Details: pt initially started with RW with significant UE dependency, noted SOB, increased forward flexion with verbal cues to stay in walker and not push to far ahead however pt stated "Its just in my way, it isn't helping me." Pt then trialed ambulation without RW and pt with significant less step lenght, increased WOB  with drop in SPO2 to 82% on 4LO2 via [place] and HR Increased up into 150s vs SpO2 >92% and HR in 130s when using RW. Pt stated "you keep talking to me making me short of breath" however defers further use of RW and that she would benefit from O2 via [place]  Stairs            Wheelchair Mobility     Tilt Bed     Modified Rankin (Stroke Patients Only)       Balance Overall balance assessment: Needs assistance Sitting-balance support: Feet supported, No upper extremity supported Sitting balance-Leahy Scale: Fair     Standing balance support: During functional activity, Single extremity supported Standing balance-Leahy Scale: Poor Standing balance comment: pt with wide base of support and prefered unilateral UE support                             Pertinent Vitals/Pain Pain Assessment Pain Assessment: 0-10 Pain Score: 8  Pain Location: low back Pain Descriptors / Indicators: Sore Pain Intervention(s): Monitored during session    Home Living Family/patient expects to be discharged to:: Private residence Living Arrangements: Alone Available Help at Discharge: Family;Friend(s);Available PRN/intermittently Type of Home: Apartment Home Access: Stairs to enter Entrance Stairs-Rails: Doctor, general practice of Steps: 22   Home Layout: One level Home Equipment: Grab bars - tub/shower      Prior Function Prior Level of Function : Independent/Modified Independent             Mobility Comments: pt reports using SCAT to go to grocery stores, takes her time on the stairs and stops every 3rd or 4th step to rest ADLs Comments: mod I, "I just take my time"     Extremity/Trunk Assessment   Upper Extremity Assessment Upper Extremity Assessment: Generalized weakness    Lower Extremity Assessment Lower Extremity Assessment: Generalized weakness    Cervical / Trunk Assessment Cervical / Trunk Assessment: Other exceptions Cervical / Trunk Exceptions: morbidly obese, back pain  Communication   Communication Communication: No apparent difficulties  Cognition Arousal: Alert Behavior During Therapy: Impulsive Overall Cognitive Status: Within Functional Limits for tasks assessed                                 General Comments: pt set in her ways  with noted resistance to recommendations to aide in energy conservation and improve ease with ambulation stating "I don't need oxygen or a walker. They'll just get in my way." Pt with decreased insight to deficits including HR and SpO2, however suspect this is patients baseline        General Comments General comments (skin integrity, edema, etc.): HR increased to 150s during amb, SpO2 dropped to 82% on 4LO2 via [place]    Exercises     Assessment/Plan    PT Assessment Patient needs continued PT services  PT Problem List Decreased strength;Decreased activity tolerance;Decreased balance;Decreased mobility;Decreased knowledge of use of DME;Decreased safety awareness;Cardiopulmonary status limiting activity       PT Treatment Interventions DME instruction;Gait training;Stair training;Functional mobility training;Therapeutic activities;Therapeutic exercise;Balance training;Neuromuscular re-education    PT Goals (Current goals can be found in the Care Plan section)  Acute Rehab PT Goals Patient Stated Goal: home asap PT Goal Formulation: With patient Time For Goal Achievement: 09/21/22 Potential to Achieve Goals: Good    Frequency Min 1X/week     Co-evaluation  AM-PAC PT "6 Clicks" Mobility  Outcome Measure Help needed turning from your back to your side while in a flat bed without using bedrails?: A Little Help needed moving from lying on your back to sitting on the side of a flat bed without using bedrails?: A Little Help needed moving to and from a bed to a chair (including a wheelchair)?: A Lot Help needed standing up from a chair using your arms (e.g., wheelchair or bedside chair)?: A Lot Help needed to walk in hospital room?: A Lot Help needed climbing 3-5 steps with a railing? : A Lot 6 Click Score: 14    End of Session Equipment Utilized During Treatment: Gait belt;Oxygen Activity Tolerance: Patient limited by fatigue Patient left: in chair;with call  bell/phone within reach Nurse Communication: Mobility status (RN present and assisted with chair follow, aware of increased HR up to 150bmp) PT Visit Diagnosis: Unsteadiness on feet (R26.81);Muscle weakness (generalized) (M62.81)    Time: 2440-1027 PT Time Calculation (min) (ACUTE ONLY): 36 min   Charges:   PT Evaluation $PT Eval Moderate Complexity: 1 Mod PT Treatments $Gait Training: 8-22 mins PT General Charges $$ ACUTE PT VISIT: 1 Visit         Lewis Shock, PT, DPT Acute Rehabilitation Services Secure chat preferred Office #: [phone]   Iona Hansen 09/07/2022, 10:24 AM

## 2022-09-07 NOTE — Progress Notes (Signed)
Transport came to unit to take patient to CT, patient refused CT stating she "can not lay flat".

## 2022-09-07 NOTE — Progress Notes (Signed)
  Echocardiogram 2D Echocardiogram has been performed.  Linda Chase 09/07/2022, 12:12 PM

## 2022-09-07 NOTE — Progress Notes (Signed)
Cardizem gtt increased to 15 ml/hr. Hr -150 A fib while walking with PT continue to monitor.

## 2022-09-07 NOTE — Progress Notes (Signed)
PROGRESS NOTE    Linda Chase  WUJ:[id number] DOB: [birth year] DOA: 09/06/2022 PCP: Rometta Emery, MD   Brief Narrative:  HPI: Linda Chase is a 67 y.o. female with medical history significant of hypertension, paroxysmal atrial fibrillation on chronic anticoagulation,  diabetes mellitus type 2, cerebral aneurysm, and morbid obesity who present with complaints of lower back pain.  History is limited from the patient as she is currently on BiPAP.  Son tries to provide additional history.  Pain started while she was out of town last month.  She went to [place] via train and came back possibly at the beginning of this month.  She reports pain was severe and she was having associated spasms.  She has been taking muscle relaxants and naproxen for the symptoms not relieved.  Noted pain was worsening with laying down and is hard for her to breathe due to the pain when trying to take deep breath.  She reported associated symptoms of urinary incontinence related with pain and difficulty ambulating.  Denies any recent trauma, falls, cough, fever, vomiting, or diarrhea.  She reports that she has been taking her Eliquis as prescribed.     In the emergency department patient was noted to be afebrile with heart rates into the 130s in atrial fibrillation, blood pressures maintained, O2 saturations as low as 81% with improvement on 3 L of nasal cannula oxygen.  Venous blood gas noted pH 7.234 with pCO2 80.2, and pO2 29.  Patient was placed on BiPAP.  Labs significant for WBC 11 and hemoglobin 11.  Chest x-ray noted no acute abnormality.  CT scan of the abdomen and pelvis noted 1.6 nodular opacity in the right lower lobe and 6 mm nodule in the right upper lobe, cardiomegaly with calcified CAD and aortic atherosclerosis, small hiatal hernia, constipation and diverticulosis, lower abdominal wall stranding which could be part of the bilateral body wall edema or cellulitis, nonobstructive right  nephrolithiasis, advanced L4-L5 facet hypertrophy with acquired spinal canal stenosis and grade 1 L4-L5 spondylolisthesis, and small focal and inguinal fat hernias.  Patient has been given DuoNeb breathing treatment, Dilaudid 1 mg IM, and started on Cardizem drip  Assessment & Plan:   Principal Problem:   Acute respiratory failure with hypercapnia (HCC) Active Problems:   Lower back pain   Leukocytosis   Abdominal wall swelling   Paroxysmal atrial fibrillation with RVR (HCC)   Chronic anticoagulation   Hypertension   Diabetes mellitus (HCC)   Abnormal CT of the abdomen   Obesity, Class III, BMI 40-49.9 (morbid obesity) (HCC)  Acute respiratory failure with hypercapnia and respiratory acidosis: ABG noted pH to be 7.32 with pCO2 61, and pO2 118.  She was started on BiPAP.  Chest x-ray negative.  CT abdomen pelvis shows pulmonary nodules.  Patient has improved significantly.  Now on 5 L of nasal cannula oxygen.  Able to speak in full sentences and denies any shortness of breath.   Acute on chronic lower back pain due to L4-5 high-grade spinal stenosis:  CT noted advanced L4-L5 facet hypertrophy with acquired spinal canal stenosis and grade 1 L4-L5 spondylolisthesis.  Seen by Dr. Pia Mau from neurosurgery but she was not awake enough to provide any history however he recommended no intervention and only physical therapy.  I have consulted PT OT.  I will start her on oxycodone p.o. because Dilaudid caused somnolence yesterday.   Leukocytosis: Resolved.  No signs of infection.  Procalcitonin negative.   Lower abdominal wall swelling  Acute.  CT imaging had noted lower abdominal wall stranding thought to either possibly be cellulitis or associated with lower extremity edema.  No clinical signs of cellulitis.  Paroxysmal atrial fibrillation with RVR on chronic anticoagulation On admission patient was noted to be in atrial fibrillation with heart rates into the 130s. On Cardizem drip, rates around  110, she is asymptomatic.  Resuming home dose of Toprol with the hope that we can wean her off of the Cardizem drip.  Echo pending.  Now that she is off of BiPAP, discontinue Lovenox and resume Eliquis.   Essential hypertension Blood pressures are currently maintained on oral outside, continue to hold amlodipine and hydrochlorothiazide.   Diabetes mellitus type 2, without long-term use of insulin On admission glucose was noted to be 129.  Home medication regimen includes metformin and Januvia, both on hold.  Hemoglobin A1c in January 2014 was 7.3.  Checking hemoglobin A1c and starting on SSI.   Abnormal CT abdomen and pelvis CT also noted 1.6 nodular opacity in the right lower lobe and 6 mm nodule in the right upper lobe, nonobstructive right sided nephrolithiasis, small focal and inguinal fat hernias, and cardiomegaly with calcified coronary artery disease and aortic atherosclerosis.  Prompt CT chest is recommended by radiology.  I have ordered CT chest with IV contrast.   Morbid obesity/obstructive sleep apnea. BMI 53.21 kg/m.  Patient does endorse history of sleep apnea but does not use any CPAP machine.  Weight loss and diet modification counseled and recommended that she has another sleep study updated and use CPAP.  DVT prophylaxis: Eliquis   Code Status: Full Code  Family Communication:  None present at bedside.  Plan of care discussed with patient in length and he/she verbalized understanding and agreed with it.  Status is: Inpatient Remains inpatient appropriate because: Still on Cardizem drip.   Estimated body mass index is 51.53 kg/m as calculated from the following:   Height as of this encounter: 5\' 4"  (1.626 m).   Weight as of this encounter: 136.2 kg.    Nutritional Assessment: Body mass index is 51.53 kg/m.Marland Kitchen Seen by dietician.  I agree with the assessment and plan as outlined below: Nutrition Status:        . Skin Assessment: I have examined the patient's skin  and I agree with the wound assessment as performed by the wound care RN as outlined below:    Consultants:  None  Procedures:  None  Antimicrobials:  Anti-infectives (From admission, onward)    None         Subjective: Patient seen and examined.  Her only complaint is low back pain.  No shortness of breath, chest pain, palpitation or any other complaint.  Objective: Vitals:   09/07/22 0749 09/07/22 0846 09/07/22 1107 09/07/22 1110  BP: 111/78  120/87   Pulse: 91 (!) 101 (!) 103 (!) 102  Resp: (!) 23 (!) 23 (!) 24 20  Temp: 99.8 F (37.7 C)  98.5 F (36.9 C)   TempSrc: Oral  Axillary   SpO2: 94% 97% 96% 97%  Weight:      Height:        Intake/Output Summary (Last 24 hours) at 09/07/2022 1139 Last data filed at 09/07/2022 1100 Gross per 24 hour  Intake 936.62 ml  Output 3400 ml  Net -2463.38 ml   Filed Weights   09/06/22 0659 09/06/22 0925 09/07/22 0156  Weight: (!) 148.6 kg (!) 140.6 kg (!) 136.2 kg    Examination:  General  exam: Appears calm and comfortable, morbidly obese Respiratory system: Clear to auscultation with some diminished breath sounds due to body habitus. Respiratory effort normal. Cardiovascular system: S1 & S2 heard, RRR. No JVD, murmurs, rubs, gallops or clicks.  Chronic +1 pitting edema bilateral lower extremity.  Hyperpigmentation. Gastrointestinal system: Abdomen is nondistended, soft and nontender. No organomegaly or masses felt. Normal bowel sounds heard. Central nervous system: Alert and oriented. No focal neurological deficits. Extremities: Symmetric 5 x 5 power. Skin: No rashes, lesions or ulcers  Data Reviewed: I have personally reviewed following labs and imaging studies  CBC: Recent Labs  Lab 09/06/22 0237 09/06/22 0442 09/07/22 0431  WBC 11.0*  --  9.9  NEUTROABS 8.9*  --   --   HGB 11.0* 12.2 10.1*  HCT 37.1 36.0 36.2  MCV 83.9  --  86.0  PLT 355  --  290   Basic Metabolic Panel: Recent Labs  Lab 09/06/22 0237  09/06/22 0442 09/06/22 1349 09/07/22 0431  NA 138 139  --  137  K 3.8 3.9  --  3.8  CL 101  --   --  99  CO2 31  --   --  29  GLUCOSE 129*  --   --  167*  BUN 12  --   --  8  CREATININE 0.89  --   --  0.80  CALCIUM 9.3  --   --  8.0*  MG  --   --  2.1  --    GFR: Estimated Creatinine Clearance: 95.3 mL/min (by C-G formula based on SCr of 0.8 mg/dL). Liver Function Tests: Recent Labs  Lab 09/06/22 0237  AST 16  ALT 12  ALKPHOS 68  BILITOT 0.4  PROT 7.8  ALBUMIN 3.8   No results for input(s): "LIPASE", "AMYLASE" in the last 168 hours. No results for input(s): "AMMONIA" in the last 168 hours. Coagulation Profile: No results for input(s): "INR", "PROTIME" in the last 168 hours. Cardiac Enzymes: No results for input(s): "CKTOTAL", "CKMB", "CKMBINDEX", "TROPONINI" in the last 168 hours. BNP (last 3 results) No results for input(s): "PROBNP" in the last 8760 hours. HbA1C: No results for input(s): "HGBA1C" in the last 72 hours. CBG: Recent Labs  Lab 09/06/22 0849 09/06/22 2220  GLUCAP 126* 122*   Lipid Profile: No results for input(s): "CHOL", "HDL", "LDLCALC", "TRIG", "CHOLHDL", "LDLDIRECT" in the last 72 hours. Thyroid Function Tests: Recent Labs    09/06/22 1349  TSH 1.776   Anemia Panel: No results for input(s): "VITAMINB12", "FOLATE", "FERRITIN", "TIBC", "IRON", "RETICCTPCT" in the last 72 hours. Sepsis Labs: Recent Labs  Lab 09/06/22 1349  PROCALCITON <0.10    No results found for this or any previous visit (from the past 240 hour(s)).   Radiology Studies: CT ABDOMEN PELVIS W CONTRAST  Result Date: 09/06/2022 CLINICAL DATA:  Abdominal and flank pain. EXAM: CT ABDOMEN AND PELVIS WITH CONTRAST TECHNIQUE: Multidetector CT imaging of the abdomen and pelvis was performed using the standard protocol following bolus administration of intravenous contrast. RADIATION DOSE REDUCTION: This exam was performed according to the departmental dose-optimization program  which includes automated exposure control, adjustment of the mA and/or kV according to patient size and/or use of iterative reconstruction technique. CONTRAST:  OMNIPAQUE IOHEXOL 300 MG/ML  SOLN COMPARISON:  Limited comparison is available with CTA chest 02/01/2012. Portable chest has also been performed today. FINDINGS: Lower chest: The heart is moderately enlarged. There is no pericardial effusion. Small hiatal hernia. There are three-vessel coronary calcifications greatest  in the LAD. Scattered calcifications in the aortic valve leaflets. There are posterior atelectatic changes. Newly noted in the right lower lobe on 4:13 adjacent the posterior crest of the hemidiaphragm, there is a 1.6 cm nodular opacity. Also, at base of the right upper lobe on 4:3 there is a noncalcified 6 mm nodule not seen previously. Prompt chest CT is recommended to assess for additional nodules. Remaining lung bases are clear. Hepatobiliary: Respiratory motion limits fine detail. No liver mass is seen through the breathing motion. The liver is 21 cm length and mildly steatotic. The gallbladder and bile ducts are unremarkable. Pancreas: No abnormality is seen through breathing motion. Spleen: No abnormality is seen through the breathing motion. No splenomegaly. Adrenals/Urinary Tract: Also with limited fine detail in the kidneys due to breathing motion. No focal abnormality is suspected in the adrenal glands and kidneys. There is a 3 mm caliceal stone in the inferior pole of the right kidney. No left nephrolithiasis or other right renal stones are seen. There is no hydronephrosis or ureteral stone. The bladder is unremarkable for the degree of distention. Stomach/Bowel: Unremarkable stomach, unremarkable unopacified small bowel. An appendix is not seen in this patient. There is moderate retained stool ascending and transverse colon. Left-sided diverticulosis but no evidence of acute diverticulitis. Vascular/Lymphatic: Aortic  atherosclerosis. No enlarged abdominal or pelvic lymph nodes. There are however, multiple bilateral enlarged inguinal chain nodes, largest on the right is 2.3 cm in short axis and the largest on the left is 2 cm in short axis. Etiology indeterminate. Reproductive: The uterus is intact. There is a 1.9 cm calcified subserosal fibroid to the right along the fundus. No adnexal mass is seen. Multiple pelvic phleboliths. Other: Small umbilical and inguinal fat hernias. Mild body wall edema. Stranding in the lower abdominal wall is seen and could be part of the bilateral body wall edema or could be due to cellulitis. There is no free fluid, free hemorrhage or free air, or incarcerated hernia. Musculoskeletal: Advanced L4-5 facet hypertrophy, acquired spinal canal stenosis and grade 1 L4-5 spondylolisthesis are noted present. Degenerative changes in visualized lower thoracic spine. No acute or other significant osseous findings. IMPRESSION: 1. 1.6 cm nodular opacity in the right lower lobe and 6 mm nodule in the right upper lobe. Prompt chest CT is recommended to assess for additional nodules. 2. Cardiomegaly with calcific CAD and aortic atherosclerosis. 3. Small hiatal hernia. 4. Constipation and diverticulosis. 5. Nonobstructive right-sided nephrolithiasis. No obstructing stones. 6. Bilateral inguinal chain adenopathy of uncertain etiology. 7. Lower abdominal wall stranding which could be part of the bilateral body wall edema or could be due to cellulitis. 8. Small umbilical and inguinal fat hernias. 9. Advanced L4-5 facet hypertrophy, acquired spinal canal stenosis and grade 1 L4-5 spondylolisthesis. 10. Mild hepatic steatosis. Aortic Atherosclerosis (ICD10-I70.0). Electronically Signed   By: Almira Bar M.D.   On: 09/06/2022 05:16   CT L-SPINE NO CHARGE  Result Date: 09/06/2022 CLINICAL DATA:  Abdominal/flank pain with stone suspected EXAM: CT Lumbar Spine with contrast TECHNIQUE: Technique: Multiplanar CT images  of the lumbar spine were reconstructed from contemporary CT of the Abdomen and Pelvis. RADIATION DOSE REDUCTION: This exam was performed according to the departmental dose-optimization program which includes automated exposure control, adjustment of the mA and/or kV according to patient size and/or use of iterative reconstruction technique. CONTRAST:  None additional COMPARISON:  None Available. FINDINGS: Segmentation: 5 lumbar type vertebrae. Alignment: Mild degenerative anterolisthesis at L4-5 and retrolisthesis at L5-S1. Vertebrae: No acute  fracture or focal pathologic process. Paraspinal and other soft tissues: Reported separately Disc levels: T12- L1: Facet and costovertebral spurring eccentric to the left with mild foraminal encroachment. L1-L2: Mild disc bulging and facet spurring L2-L3: Mild disc bulging and facet spurring L3-L4: Moderate degenerative facet hypertrophy. Circumferential disc bulging. Moderate spinal and biforaminal stenosis L4-L5: Advanced facet osteoarthritis with spurring and anterolisthesis. The disc is narrowed and bulging. Advanced appearing spinal and biforaminal stenosis L5-S1:Moderate degenerative facet spurring asymmetric to the left. Mild disc bulging and mild left foraminal narrowing. IMPRESSION: 1. No acute finding. 2. Generalized lumbar spine degeneration especially affecting lower lumbar facets with L4-5 and L5-S1 anterolisthesis. 3. L4-5 high-grade spinal stenosis. 4. Foraminal impingement suspected at both L3-4 and L4-5. Electronically Signed   By: Tiburcio Pea M.D.   On: 09/06/2022 04:56   DG Chest Portable 1 View  Result Date: 09/06/2022 CLINICAL DATA:  Shortness of breath EXAM: PORTABLE CHEST 1 VIEW COMPARISON:  04/06/2013 FINDINGS: Cardiac shadow is mildly enlarged. Lungs are clear bilaterally. No bony abnormality is noted. IMPRESSION: No active disease. Electronically Signed   By: Alcide Clever M.D.   On: 09/06/2022 03:13    Scheduled Meds:  enoxaparin (LOVENOX)  injection  140 mg Subcutaneous Q12H   metoprolol succinate  50 mg Oral Daily   revefenacin  175 mcg Nebulization Daily   sodium chloride flush  3 mL Intravenous Q12H   Continuous Infusions:  diltiazem (CARDIZEM) infusion 12 mg/hr (09/07/22 1100)     LOS: 1 day   Hughie Closs, MD [facility]  09/07/2022, 11:39 AM   *Please note that this is a verbal dictation therefore any spelling or grammatical errors are due to the "Dragon [employer] One" system interpretation.  Please page via Amion and do not message via secure chat for urgent patient care matters. Secure chat can be used for non urgent patient care matters.  How to contact the [facility] Attending or Consulting provider 7A - 7P or covering provider during after hours 7P -7A, for this patient?  Check the care team in [facility] and look for a) attending/consulting [facility] provider listed and b) the [facility] team listed. Page or secure chat 7A-7P. Log into [email or web address] and use [facility]'s universal password to access. If you do not have the password, please contact the hospital operator. Locate the [facility] provider you are looking for under [facility] and page to a number that you can be directly reached. If you still have difficulty reaching the provider, please page the [facility] (Director on Call) for the Hospitalists listed on amion for assistance.

## 2022-09-07 NOTE — Progress Notes (Signed)
RT at bedside for morning breathing tx. RT found pt on 5L [place] with BiPAP (servo-air) on stby at bedside.

## 2022-09-07 NOTE — Progress Notes (Signed)
Occupational Therapy Evaluation Patient Details Name: Linda Chase MRN: [id number] DOB: 03-31-1955 Today's Date: 09/07/2022   History of Present Illness Linda Chase is a 67 y.o. female who presented to [facility] ED with complaints of lower back pain however due to respiratory difficulty placed on biPap and then transferred to [facility]. HR also in 130s in a-fib. PMH: HTN, paroxysmal a-fib on chronic anticoagulation, DM II, cerebral aneurysm, and morbid obesity   Clinical Impression   Pt with decreased awareness overall and decreased completion of simulated ADL tasks.  She needed min assist for supine to sit with HOB elevated and use of rails.  Increased back pain noted throughout session with pt being resistant to any cueing to help improve transitions.  Oxygen at rest 91% with HR elevating from the low 100s up to the 120s with sitting EOB and standing.  Oxygen 96 % or higher on 5Ls nasal cannula.  Min assist for sit to stand from the elevated bed.  Overall max assist would be needed for LB selfcare and toileting due to her elevated back pain and decreased flexibility to reach her LEs.  Per her report she lives with her son but he's not home a lot due to work. Feel she will benefit from acute care OT to help provide education on back precautions, DME recommendation and use, AE use, and energy conservation.  Do not feel she would be safe at home alone as she demonstrates little awareness of her balance deficits and need for DME.  Recommend post acute patient follow up therapy, <3 hours/day prior to transition home.       If plan is discharge home, recommend the following: A little help with walking and/or transfers;A lot of help with bathing/dressing/bathroom;Direct supervision/assist for medications management;Help with stairs or ramp for entrance;Assist for transportation;Direct supervision/assist for financial management;Assistance with cooking/housework    Functional Status  Assessment  Patient has had a recent decline in their functional status and demonstrates the ability to make significant improvements in function in a reasonable and predictable amount of time.  Equipment Recommendations  Other (comment) (TBD pt likely resistant to DME recommendations)       Precautions / Restrictions Precautions Precautions: Fall Precaution Comments: watch SpO2 and HR Restrictions Weight Bearing Restrictions: No      Mobility Bed Mobility Overal bed mobility: Needs Assistance Bed Mobility: Supine to Sit, Sit to Supine     Supine to sit: Min assist, HOB elevated, Used rails Sit to supine: Total assist, +2 for safety/equipment   General bed mobility comments: Pt needed assist with bringing LEs into the bed as well as getting upper body down and squared up in the middle.    Transfers Overall transfer level: Needs assistance Equipment used: Rolling walker (2 wheels) Transfers: Sit to/from Stand Sit to Stand: Min assist           General transfer comment: Min assist for sit to stand from elevated bed with use of the RW.  Mod demonstrational cueing to not pull up on the walker with one hand but to try and push off of the surface of the bed.  Increased time needed to complete stand with pt telling therapist to "move so I can do it".      Balance Overall balance assessment: Needs assistance Sitting-balance support: Feet supported, No upper extremity supported Sitting balance-Leahy Scale: Fair     Standing balance support: During functional activity, Single extremity supported Standing balance-Leahy Scale: Poor Standing balance comment: Pt needs  BUE support on the RW for standing and mobility.                           ADL either performed or assessed with clinical judgement   ADL Overall ADL's : Needs assistance/impaired Eating/Feeding: Set up;Sitting Eating/Feeding Details (indicate cue type and reason): drink from cup Grooming: Sitting;Set  up Grooming Details (indicate cue type and reason): simulated Upper Body Bathing: Set up;Sitting Upper Body Bathing Details (indicate cue type and reason): simulated Lower Body Bathing: Moderate assistance;Sit to/from stand Lower Body Bathing Details (indicate cue type and reason): simulated Upper Body Dressing : Set up;Sitting Upper Body Dressing Details (indicate cue type and reason): simulated Lower Body Dressing: Maximal assistance;Sit to/from stand Lower Body Dressing Details (indicate cue type and reason): simulated Toilet Transfer: Minimal assistance;Rolling walker (2 wheels);BSC/3in1;Stand-pivot Toilet Transfer Details (indicate cue type and reason): simulated at EOB         Functional mobility during ADLs: Minimal assistance;Rolling walker (2 wheels) (stepping up EOB) General ADL Comments: Pt resistant to all movement and direction from therapist.  When entering she was scooted all the way to the bottom of the bed with her head bent up only secondary to the [facility] being up.  Had to reason with patient that we needed to transition to the EOB and then stand to step up toward the top where she could sit back down and then get positioned better.  Pt would not follow direction for log rolling to help reduce pain but instead resisted assistance and use bed rails to get ub, bringing LEs off of the bed and then sitting up with increased time while yelling out in pain.  Min assist for sit to stand from elevated bed and for taking small steps to the top of the bed.  When transitioning back to supine, pt unable to follow/understand proper technique for laying down.  Max +2 needed for bringing trunk down and LEs back to bed.  Pt hysterical screaming out in pain and sliding down in the bed before therapist could raise the HOB up.  Therapist, nursing, and Echo Tech assisted with getting her more toward the middle of the bed but she would not let us scoot her up.  Pt's HR in the low 100s at rest increasing  up to 120 with supine to sit and standing that therapist was able to see.  Oxygen sats 91% on room air with an increase to 96-98% with 5Ls nasal cannula.     Vision Baseline Vision/History: 0 No visual deficits Ability to See in Adequate Light:  (not tested) Vision Assessment?: No apparent visual deficits Additional Comments: Not formally assessed secondary to cognition.     Perception Perception: Not tested       Praxis Praxis: Not tested       Pertinent Vitals/Pain Pain Assessment Pain Assessment: Faces Faces Pain Scale: Hurts worst Pain Location: low back with movement Pain Descriptors / Indicators: Discomfort, Grimacing, Stabbing Pain Intervention(s): Monitored during session     Extremity/Trunk Assessment Upper Extremity Assessment Upper Extremity Assessment: Difficult to assess due to impaired cognition (Pt would not actively participate and would only squeeze therapist's hand with the LUE.  Noted spontaneous use of UEs with bed mobility and for standing with use of the RW.)   Lower Extremity Assessment Lower Extremity Assessment: Defer to PT evaluation   Cervical / Trunk Assessment Cervical / Trunk Assessment: Other exceptions Cervical / Trunk Exceptions: morbidly obese, back  pain   Communication Communication Communication: No apparent difficulties   Cognition Arousal: Alert Behavior During Therapy: Restless Overall Cognitive Status: No family/caregiver present to determine baseline cognitive functioning Area of Impairment: Following commands, Safety/judgement, Awareness, Problem solving                       Following Commands: Follows one step commands consistently, Follows multi-step commands with increased time Safety/Judgement: Decreased awareness of safety Awareness: Intellectual Problem Solving: Slow processing, Difficulty sequencing, Requires verbal cues, Requires tactile cues General Comments: Pt with decreased awareness of deficits and  inability to transfer and mobilize safety.  When asked if she has problems with balance, she denies them and instead says it's my back that's hurting and that's why I can't do things.  Pt resistant to directions most of the time during transitions and transfers.  Unsure if this is baseline.     General Comments  HR increased to 150s during amb, SpO2 dropped to 82% on 4LO2 via [place]            Home Living Family/patient expects to be discharged to:: Private residence Living Arrangements: Alone Available Help at Discharge: Family;Friend(s);Available PRN/intermittently Type of Home: Apartment Home Access: Stairs to enter Entrance Stairs-Number of Steps: 22 Entrance Stairs-Rails: Right;Left Home Layout: One level     Bathroom Shower/Tub: Chief Strategy Officer: Standard     Home Equipment: Grab bars - tub/shower          Prior Functioning/Environment Prior Level of Function : Independent/Modified Independent             Mobility Comments: pt reports using SCAT to go to grocery stores, takes her time on the stairs and stops every 3rd or 4th step to rest ADLs Comments: mod I, "I just take my time"        OT Problem List: Decreased strength;Decreased activity tolerance;Impaired balance (sitting and/or standing);Decreased safety awareness;Pain;Decreased cognition;Decreased knowledge of precautions;Decreased knowledge of use of DME or AE      OT Treatment/Interventions: Self-care/ADL training;Patient/family education;Balance training;Therapeutic activities;DME and/or AE instruction;Energy conservation;Cognitive remediation/compensation    OT Goals(Current goals can be found in the care plan section) Acute Rehab OT Goals Patient Stated Goal: Pt did not state this session OT Goal Formulation: Patient unable to participate in goal setting Time For Goal Achievement: 09/21/22 Potential to Achieve Goals: Fair  OT Frequency: Min 1X/week       AM-PAC OT "6 Clicks"  Daily Activity     Outcome Measure Help from another person eating meals?: None Help from another person taking care of personal grooming?: A Little Help from another person toileting, which includes using toliet, bedpan, or urinal?: A Lot Help from another person bathing (including washing, rinsing, drying)?: A Lot Help from another person to put on and taking off regular upper body clothing?: A Little Help from another person to put on and taking off regular lower body clothing?: A Lot 6 Click Score: 16   End of Session Equipment Utilized During Treatment: Rolling walker (2 wheels) Nurse Communication: Mobility status  Activity Tolerance: Patient limited by pain Patient left: in bed;with call bell/phone within reach;with nursing/sitter in room  OT Visit Diagnosis: Unsteadiness on feet (R26.81);Other abnormalities of gait and mobility (R26.89);Muscle weakness (generalized) (M62.81);Pain;Other symptoms and signs involving cognitive function Pain - part of body:  (lower back pain)                Time: 4010-2725 OT Time Calculation (min):  25 min Charges:  OT General Charges $OT Visit: 1 Visit OT Evaluation $OT Eval Moderate Complexity: 1 Mod OT Treatments $Self Care/Home Management : 8-22 mins  Perrin Maltese, OTR/L Acute Rehabilitation Services  Office [phone] 09/07/2022

## 2022-09-07 NOTE — Progress Notes (Signed)
Pharmacy Consult for Lovenox  Indication: atrial fibrillation  Allergies  Allergen Reactions   Hydrocodone Nausea And Vomiting    Patient Measurements: Height: 5\' 4"  (162.6 cm) Weight: (!) 136.2 kg (300 lb 3.2 oz) IBW/kg (Calculated) : 54.7 HEPARIN DW (KG): 90  Vital Signs: Temp: 98.6 F (37 C) (08/27 0400) Temp Source: Axillary (08/27 0400) BP: 122/74 (08/27 0341) Pulse Rate: 132 (08/27 0346)  Labs: Recent Labs    09/06/22 0237 09/06/22 0442 09/07/22 0431  HGB 11.0* 12.2 10.1*  HCT 37.1 36.0 36.2  PLT 355  --  290  CREATININE 0.89  --  0.80    Estimated Creatinine Clearance: 95.3 mL/min (by C-G formula based on SCr of 0.8 mg/dL).  Assessment: Linda Chase a 67 y.o. female presented with severe back pain, found to be in atrial fibrillation. Pharmacy consulted to dose enoxaparin. As patient is currently unable to provide timing of last apixaban dose, will cautiously dose as if the last dose was early AM 8/26. Patient obese with BMI>50, renal function >50 mL/min. No signs of PE on CT.   8/27 AM: Hgb drift down 12.2>10.1. No signs of bleeding noted by RN. Per NSGY, patient likely has lumbar stenosis, causing back pain. Surgical intervention limited by her current respiratory status.   Anticoagulation PTA: apixaban 5 mg BID for unknown indication- Last dose unknown, patient unable to provide information at this time.   Goal of Therapy:  Monitor platelets by anticoagulation protocol: Yes   Plan:  Continue enoxaparin 140 mg (~ 1 mg/kg) Q12H  CBC daily  F/U transition to apixaban as able Levels PRN  F/U surgical plans to hold lovenox if indicated   Jani Gravel, PharmD Clinical Pharmacist  09/07/2022 8:01 AM

## 2022-09-07 NOTE — Progress Notes (Signed)
2D echo attempted, patient in chair. Will try later

## 2022-09-08 ENCOUNTER — Inpatient Hospital Stay (HOSPITAL_COMMUNITY): Payer: Medicare HMO

## 2022-09-08 DIAGNOSIS — J9602 Acute respiratory failure with hypercapnia: Secondary | ICD-10-CM | POA: Diagnosis not present

## 2022-09-08 LAB — GLUCOSE, CAPILLARY
Glucose-Capillary: 140 mg/dL — ABNORMAL HIGH (ref 70–99)
Glucose-Capillary: 180 mg/dL — ABNORMAL HIGH (ref 70–99)
Glucose-Capillary: 173 mg/dL — ABNORMAL HIGH (ref 70–99)
Glucose-Capillary: 164 mg/dL — ABNORMAL HIGH (ref 70–99)

## 2022-09-08 LAB — BASIC METABOLIC PANEL
Anion gap: 12 (ref 5–15)
BUN: 9 mg/dL (ref 8–23)
CO2: 33 mmol/L — ABNORMAL HIGH (ref 22–32)
Calcium: 8.6 mg/dL — ABNORMAL LOW (ref 8.9–10.3)
Chloride: 95 mmol/L — ABNORMAL LOW (ref 98–111)
Creatinine, Ser: 1.03 mg/dL — ABNORMAL HIGH (ref 0.44–1.00)
GFR, Estimated: 60 mL/min — ABNORMAL LOW (ref >60)
Glucose, Bld: 139 mg/dL — ABNORMAL HIGH (ref 70–99)
Potassium: 4.2 mmol/L (ref 3.5–5.1)
Sodium: 140 mmol/L (ref 135–145)

## 2022-09-08 LAB — CBC WITH DIFFERENTIAL/PLATELET
Abs Immature Granulocytes: 0.06 10*3/uL (ref 0.00–0.07)
Basophils Absolute: 0 10*3/uL (ref 0.0–0.1)
Basophils Relative: 0 %
Eosinophils Absolute: 0.2 10*3/uL (ref 0.0–0.5)
Eosinophils Relative: 1 %
HCT: 37.8 % (ref 36.0–46.0)
Hemoglobin: 10.7 g/dL — ABNORMAL LOW (ref 12.0–15.0)
Immature Granulocytes: 1 %
Lymphocytes Relative: 12 %
Lymphs Abs: 1.4 10*3/uL (ref 0.7–4.0)
MCH: 24.8 pg — ABNORMAL LOW (ref 26.0–34.0)
MCHC: 28.3 g/dL — ABNORMAL LOW (ref 30.0–36.0)
MCV: 87.5 fL (ref 80.0–100.0)
Monocytes Absolute: 1.3 10*3/uL — ABNORMAL HIGH (ref 0.1–1.0)
Monocytes Relative: 11 %
Neutro Abs: 8.8 10*3/uL — ABNORMAL HIGH (ref 1.7–7.7)
Neutrophils Relative %: 75 %
Platelets: 277 10*3/uL (ref 150–400)
RBC: 4.32 MIL/uL (ref 3.87–5.11)
RDW: 15.1 % (ref 11.5–15.5)
WBC: 11.7 10*3/uL — ABNORMAL HIGH (ref 4.0–10.5)
nRBC: 0 % (ref 0.0–0.2)

## 2022-09-08 MED ORDER — METOPROLOL SUCCINATE ER 100 MG PO TB24
100.0000 mg | ORAL_TABLET | Freq: Every day | ORAL | Status: DC
  Administered 2022-09-08 – 2022-09-22 (×14): 100 mg via ORAL
  Filled 2022-09-08 (×14): qty 1

## 2022-09-08 MED ORDER — IOHEXOL 350 MG/ML SOLN
75.0000 mL | Freq: Once | INTRAVENOUS | Status: AC | PRN
  Administered 2022-09-08: 75 mL via INTRAVENOUS

## 2022-09-08 NOTE — Plan of Care (Signed)

## 2022-09-08 NOTE — Progress Notes (Signed)
PROGRESS NOTE    Linda Chase  OVF:[id number] DOB: 06/30/1955 DOA: 09/06/2022 PCP: Rometta Emery, MD   Brief Narrative:  HPI: Linda Chase is a 67 y.o. female with medical history significant of hypertension, paroxysmal atrial fibrillation on chronic anticoagulation,  diabetes mellitus type 2, cerebral aneurysm, and morbid obesity who present with complaints of lower back pain.  History is limited from the patient as she is currently on BiPAP.  Son tries to provide additional history.  Pain started while she was out of town last month.  She went to [place] via train and came back possibly at the beginning of this month.  She reports pain was severe and she was having associated spasms.  She has been taking muscle relaxants and naproxen for the symptoms not relieved.  Noted pain was worsening with laying down and is hard for her to breathe due to the pain when trying to take deep breath.  She reported associated symptoms of urinary incontinence related with pain and difficulty ambulating.  Denies any recent trauma, falls, cough, fever, vomiting, or diarrhea.  She reports that she has been taking her Eliquis as prescribed.     In the emergency department patient was noted to be afebrile with heart rates into the 130s in atrial fibrillation, blood pressures maintained, O2 saturations as low as 81% with improvement on 3 L of nasal cannula oxygen.  Venous blood gas noted pH 7.234 with pCO2 80.2, and pO2 29.  Patient was placed on BiPAP.  Labs significant for WBC 11 and hemoglobin 11.  Chest x-ray noted no acute abnormality.  CT scan of the abdomen and pelvis noted 1.6 nodular opacity in the right lower lobe and 6 mm nodule in the right upper lobe, cardiomegaly with calcified CAD and aortic atherosclerosis, small hiatal hernia, constipation and diverticulosis, lower abdominal wall stranding which could be part of the bilateral body wall edema or cellulitis, nonobstructive right  nephrolithiasis, advanced L4-L5 facet hypertrophy with acquired spinal canal stenosis and grade 1 L4-L5 spondylolisthesis, and small focal and inguinal fat hernias.  Patient has been given DuoNeb breathing treatment, Dilaudid 1 mg IM, and started on Cardizem drip  Assessment & Plan:   Principal Problem:   Acute respiratory failure with hypercapnia (HCC) Active Problems:   Lower back pain   Leukocytosis   Abdominal wall swelling   Paroxysmal atrial fibrillation with RVR (HCC)   Chronic anticoagulation   Hypertension   Diabetes mellitus (HCC)   Abnormal CT of the abdomen   Obesity, Class III, BMI 40-49.9 (morbid obesity) (HCC)  Acute respiratory failure with hypercapnia and respiratory acidosis: ABG noted pH to be 7.32 with pCO2 61, and pO2 118.  She was started on BiPAP.  Chest x-ray negative.  CT abdomen pelvis shows pulmonary nodules.  Patient has improved significantly.  Now on 4 L of nasal cannula oxygen.  Able to speak in full sentences and denies any shortness of breath.   Acute on chronic lower back pain due to L4-5 high-grade spinal stenosis:  CT noted advanced L4-L5 facet hypertrophy with acquired spinal canal stenosis and grade 1 L4-L5 spondylolisthesis.  Seen by Dr. Pia Mau from neurosurgery but she was not awake enough to provide any history however he recommended no intervention and only physical therapy.  Seen by PT OT, recommendation for SNF.  Continue oxycodone.  Pain is improving.   Leukocytosis: Resolved.  No signs of infection.  Procalcitonin negative.   Lower abdominal wall swelling  Acute.  CT  imaging had noted lower abdominal wall stranding thought to either possibly be cellulitis or associated with lower extremity edema.  No clinical signs of cellulitis.  Paroxysmal atrial fibrillation with RVR on chronic anticoagulation On admission patient was noted to be in atrial fibrillation with heart rates into the 130s.  Started on Cardizem drip and remains on Cardizem drip.   Rates hovering between 100-1 10 during my evaluation.  We resumed her Toprol-XL at 50 yesterday.  I will increase this to 100 mg and try to wean her off of Cardizem today.  Continue Eliquis.   Essential hypertension Blood pressures on low side, continue to hold amlodipine and hydrochlorothiazide.   Diabetes mellitus type 2, without long-term use of insulin On admission glucose was noted to be 129.  Home medication regimen includes metformin and Januvia, both on hold.  Hemoglobin A1c 7.8.  On SSI currently and blood sugar controlled.   Abnormal CT abdomen and pelvis CT also noted 1.6 nodular opacity in the right lower lobe and 6 mm nodule in the right upper lobe, nonobstructive right sided nephrolithiasis, small focal and inguinal fat hernias, and cardiomegaly with calcified coronary artery disease and aortic atherosclerosis.  Prompt CT chest is recommended by radiology.  CT chest was ordered yesterday and is still pending.   Morbid obesity/obstructive sleep apnea. BMI 53.21 kg/m.  Patient does endorse history of sleep apnea but does not use any CPAP machine.  Weight loss and diet modification counseled and recommended that she has another sleep study updated and use CPAP.  DVT prophylaxis: Eliquis   Code Status: Full Code  Family Communication:  None present at bedside.  Plan of care discussed with patient in length and he/she verbalized understanding and agreed with it.  Status is: Inpatient Remains inpatient appropriate because: Still on Cardizem drip.  Will need discharge to SNF once medically stable.   Estimated body mass index is 51.5 kg/m as calculated from the following:   Height as of this encounter: 5\' 4"  (1.626 m).   Weight as of this encounter: 136.1 kg.    Nutritional Assessment: Body mass index is 51.5 kg/m.Marland Kitchen Seen by dietician.  I agree with the assessment and plan as outlined below: Nutrition Status:        . Skin Assessment: I have examined the patient's skin  and I agree with the wound assessment as performed by the wound care RN as outlined below:    Consultants:  None  Procedures:  None  Antimicrobials:  Anti-infectives (From admission, onward)    None         Subjective: Patient seen and examined.  She is feeling better than yesterday.  Her back pain is improving.  She thought that she will be going home and wanted me to prescribe pain medications and discharge her.  I informed her that based on the PT recommendations, we highly recommend that she goes to SNF.  She verbalized understanding and agrees with that.  Objective: Vitals:   09/08/22 0421 09/08/22 0739 09/08/22 0811 09/08/22 0926  BP: 110/62 97/69  117/75  Pulse: 86 (!) 109  92  Resp: 19 (!) 25    Temp: (!) 100.9 F (38.3 C) 97.8 F (36.6 C)    TempSrc: Axillary Oral    SpO2: 97% 91% 96%   Weight: (!) 136.1 kg     Height:        Intake/Output Summary (Last 24 hours) at 09/08/2022 1035 Last data filed at 09/08/2022 0743 Gross per 24 hour  Intake 659.91 ml  Output 1800 ml  Net -1140.09 ml   Filed Weights   09/06/22 0925 09/07/22 0156 09/08/22 0421  Weight: (!) 140.6 kg (!) 136.2 kg (!) 136.1 kg    Examination:  General exam: Appears calm and comfortable, morbidly obese Respiratory system: Clear to auscultation. Respiratory effort normal. Cardiovascular system: S1 & S2 heard, regularly regular RR. No JVD, murmurs, rubs, gallops or clicks.  2 pitting edema bilateral lower extremity. Gastrointestinal system: Abdomen is nondistended, soft and nontender. No organomegaly or masses felt. Normal bowel sounds heard. Central nervous system: Alert and oriented. No focal neurological deficits. Extremities: Symmetric 5 x 5 power. Skin: No rashes, lesions or ulcers.  Psychiatry: Judgement and insight appear normal. Mood & affect appropriate.   Data Reviewed: I have personally reviewed following labs and imaging studies  CBC: Recent Labs  Lab 09/06/22 0237  09/06/22 0442 09/07/22 0431 09/08/22 0338  WBC 11.0*  --  9.9 11.7*  NEUTROABS 8.9*  --   --  8.8*  HGB 11.0* 12.2 10.1* 10.7*  HCT 37.1 36.0 36.2 37.8  MCV 83.9  --  86.0 87.5  PLT 355  --  290 277   Basic Metabolic Panel: Recent Labs  Lab 09/06/22 0237 09/06/22 0442 09/06/22 1349 09/07/22 0431 09/08/22 0338  NA 138 139  --  137 140  K 3.8 3.9  --  3.8 4.2  CL 101  --   --  99 95*  CO2 31  --   --  29 33*  GLUCOSE 129*  --   --  167* 139*  BUN 12  --   --  8 9  CREATININE 0.89  --   --  0.80 1.03*  CALCIUM 9.3  --   --  8.0* 8.6*  MG  --   --  2.1  --   --    GFR: Estimated Creatinine Clearance: 74 mL/min (A) (by C-G formula based on SCr of 1.03 mg/dL (H)). Liver Function Tests: Recent Labs  Lab 09/06/22 0237  AST 16  ALT 12  ALKPHOS 68  BILITOT 0.4  PROT 7.8  ALBUMIN 3.8   No results for input(s): "LIPASE", "AMYLASE" in the last 168 hours. No results for input(s): "AMMONIA" in the last 168 hours. Coagulation Profile: No results for input(s): "INR", "PROTIME" in the last 168 hours. Cardiac Enzymes: No results for input(s): "CKTOTAL", "CKMB", "CKMBINDEX", "TROPONINI" in the last 168 hours. BNP (last 3 results) No results for input(s): "PROBNP" in the last 8760 hours. HbA1C: Recent Labs    09/07/22 0431  HGBA1C 7.8*   CBG: Recent Labs  Lab 09/06/22 2220 09/07/22 1200 09/07/22 1608 09/07/22 2100 09/08/22 0606  GLUCAP 122* 149* 161* 130* 140*   Lipid Profile: No results for input(s): "CHOL", "HDL", "LDLCALC", "TRIG", "CHOLHDL", "LDLDIRECT" in the last 72 hours. Thyroid Function Tests: Recent Labs    09/06/22 1349  TSH 1.776   Anemia Panel: No results for input(s): "VITAMINB12", "FOLATE", "FERRITIN", "TIBC", "IRON", "RETICCTPCT" in the last 72 hours. Sepsis Labs: Recent Labs  Lab 09/06/22 1349  PROCALCITON <0.10    No results found for this or any previous visit (from the past 240 hour(s)).   Radiology Studies: ECHOCARDIOGRAM  COMPLETE  Result Date: 09/07/2022    ECHOCARDIOGRAM REPORT   Patient Name:   Linda Chase Date of Exam: 09/07/2022 Medical Rec #:  [id number]           Height:       64.0 in Accession #:  [id number]          Weight:       300.2 lb Date of Birth:  06-16-1955          BSA:          2.325 m Patient Age:    66 years            BP:           111/78 mmHg Patient Gender: F                   HR:           101 bpm. Exam Location:  Inpatient Procedure: 2D Echo, 3D Echo, Cardiac Doppler and Color Doppler Indications:    I48.91* Unspeicified atrial fibrillation  History:        Patient has prior history of Echocardiogram examinations, most                 recent 02/02/2012. Abnormal ECG, COPD, Arrythmias:Atrial                 Fibrillation; Risk Factors:Diabetes and Hypertension.  Sonographer:    Sheralyn Boatman RDCS Referring Phys: Clydie Braun  Sonographer Comments: Patient is obese. Image acquisition challenging due to patient body habitus. Study delayed 20 minutes to get patient back in bed IMPRESSIONS  1. Left ventricular ejection fraction, by estimation, is 50%. Left ventricular ejection fraction by 3D volume is 49 %. The left ventricle has mildly decreased function. The left ventricle demonstrates global hypokinesis. There is mild left ventricular hypertrophy. Left ventricular diastolic parameters are indeterminate. There is the interventricular septum is flattened in systole, consistent with right ventricular pressure overload.  2. Right ventricular systolic function is mildly reduced. The right ventricular size is mildly enlarged. Mildly increased right ventricular wall thickness. There is mildly elevated pulmonary artery systolic pressure. The estimated right ventricular systolic pressure is 44.8 mmHg.  3. Left atrial size was mildly dilated.  4. Right atrial size was mildly dilated.  5. The mitral valve is grossly normal. Trivial mitral valve regurgitation. No evidence of mitral stenosis. Moderate mitral  annular calcification.  6. There is one beat of systolic flow reversal of hepatic vein spectral Doppler. Tricuspid valve regurgitation is moderate to severe.  7. Nodular calcification of leaflet tips. The aortic valve is tricuspid. There is mild calcification of the aortic valve. Aortic valve regurgitation is trivial. No aortic stenosis is present.  8. Aortic dilatation noted. There is mild dilatation of the ascending aorta, measuring 42 mm.  9. The inferior vena cava is dilated in size with <50% respiratory variability, suggesting right atrial pressure of 15 mmHg. Comparison(s): Unable to view prior study. FINDINGS  Left Ventricle: Left ventricular ejection fraction, by estimation, is 50%. Left ventricular ejection fraction by 3D volume is 49 %. The left ventricle has mildly decreased function. The left ventricle demonstrates global hypokinesis. The left ventricular internal cavity size was normal in size. There is mild left ventricular hypertrophy. The interventricular septum is flattened in systole, consistent with right ventricular pressure overload. Left ventricular diastolic parameters are indeterminate. Right Ventricle: The right ventricular size is mildly enlarged. Mildly increased right ventricular wall thickness. Right ventricular systolic function is mildly reduced. There is mildly elevated pulmonary artery systolic pressure. The tricuspid regurgitant velocity is 2.73 m/s, and with an assumed right atrial pressure of 15 mmHg, the estimated right ventricular systolic pressure is 44.8 mmHg. Left Atrium: Left atrial size was mildly dilated. Right Atrium: Right atrial  size was mildly dilated. Pericardium: There is no evidence of pericardial effusion. Mitral Valve: The mitral valve is grossly normal. Moderate mitral annular calcification. Trivial mitral valve regurgitation. No evidence of mitral valve stenosis. Tricuspid Valve: There is one beat of systolic flow reversal of hepatic vein spectral Doppler. The  tricuspid valve is grossly normal. Tricuspid valve regurgitation is moderate to severe. No evidence of tricuspid stenosis. Aortic Valve: Nodular calcification of leaflet tips. The aortic valve is tricuspid. There is mild calcification of the aortic valve. Aortic valve regurgitation is trivial. No aortic stenosis is present. Pulmonic Valve: The pulmonic valve was normal in structure. Pulmonic valve regurgitation is not visualized. No evidence of pulmonic stenosis. Aorta: Aortic dilatation noted. There is mild dilatation of the ascending aorta, measuring 42 mm. Venous: The inferior vena cava is dilated in size with less than 50% respiratory variability, suggesting right atrial pressure of 15 mmHg. IAS/Shunts: No atrial level shunt detected by color flow Doppler.  LEFT VENTRICLE PLAX 2D LVIDd:         4.60 cm LVIDs:         3.50 cm LV PW:         1.60 cm         3D Volume EF LV IVS:        1.20 cm         LV 3D EF:    Left                                             ventricul                                             ar LV Volumes (MOD)                            ejection LV vol d, MOD    69.4 ml                    fraction A2C:                                        by 3D LV vol d, MOD    64.8 ml                    volume is A4C:                                        49 %. LV vol s, MOD    28.0 ml A2C: LV vol s, MOD    29.9 ml       3D Volume EF: A4C:                           3D EF:        49 % LV SV MOD A2C:   41.4 ml       LV EDV:       170 ml LV SV MOD A4C:  64.8 ml       LV ESV:       87 ml LV SV MOD BP:    39.9 ml       LV SV:        83 ml RIGHT VENTRICLE             IVC RV S prime:     13.30 cm/s  IVC diam: 2.70 cm RVOT diam:      2.90 cm TAPSE (M-mode): 1.9 cm LEFT ATRIUM             Index        RIGHT ATRIUM           Index LA diam:        4.90 cm 2.11 cm/m   RA Area:     24.80 cm LA Vol (A2C):   64.5 ml 27.74 ml/m  RA Volume:   78.20 ml  33.64 ml/m LA Vol (A4C):   68.1 ml 29.29 ml/m LA Biplane Vol:  67.9 ml 29.20 ml/m  AORTIC VALVE LVOT Vmax:   136.50 cm/s LVOT Vmean:  93.500 cm/s LVOT VTI:    0.210 m  AORTA Ao Root diam: 3.10 cm Ao Asc diam:  4.15 cm MITRAL VALVE                TRICUSPID VALVE MV Area (PHT): 3.99 cm     TR Peak grad:   29.8 mmHg MV Decel Time: 190 msec     TR Vmax:        273.00 cm/s MV E velocity: 134.00 cm/s                             SHUNTS                             Systemic VTI:  0.21 m                             Pulmonic Diam: 2.90 cm Riley Lam MD Electronically signed by Riley Lam MD Signature Date/Time: 09/07/2022/1:29:37 PM    Final     Scheduled Meds:  apixaban  5 mg Oral BID   atorvastatin  20 mg Oral Daily   cholecalciferol  2,000 Units Oral Daily   furosemide  40 mg Oral Daily   gabapentin  300 mg Oral QHS   insulin aspart  0-15 Units Subcutaneous TID WC   insulin aspart  0-5 Units Subcutaneous QHS   metoprolol succinate  100 mg Oral Daily   revefenacin  175 mcg Nebulization Daily   sodium chloride flush  3 mL Intravenous Q12H   Continuous Infusions:  diltiazem (CARDIZEM) infusion 5 mg/hr (09/08/22 0926)     LOS: 2 days   Hughie Closs, MD [facility]  09/08/2022, 10:35 AM   *Please note that this is a verbal dictation therefore any spelling or grammatical errors are due to the "Dragon Medical One" system interpretation.  Please page via Amion and do not message via secure chat for urgent patient care matters. Secure chat can be used for non urgent patient care matters.  How to contact the [facility] Attending or Consulting provider 7A - 7P or covering provider during after hours 7P -7A, for this patient?  Check the care team in [facility] and look for a) attending/consulting [facility] provider listed and b)  the [facility] team listed. Page or secure chat 7A-7P. Log into [email or web address] and use [place]'s universal password to access. If you do not have the password, please contact the hospital operator. Locate the [facility] provider you are looking  for under [facility] and page to a number that you can be directly reached. If you still have difficulty reaching the provider, please page the [facility] (Director on Call) for the Hospitalists listed on amion for assistance.

## 2022-09-08 NOTE — Progress Notes (Addendum)
Mobility Specialist Progress Note:   09/08/22 1451  Mobility  Activity Ambulated with assistance in hallway  Level of Assistance Moderate assist, patient does 50-74% (+2)  Assistive Device Front wheel walker  Distance Ambulated (ft) 45 ft  Activity Response Tolerated well  Mobility Referral Yes  $Mobility charge 1 Mobility  Mobility Specialist Start Time (ACUTE ONLY) 1345  Mobility Specialist Stop Time (ACUTE ONLY) 1445  Mobility Specialist Time Calculation (min) (ACUTE ONLY) 60 min    Pre Mobility: 90 HR , 100% SpO2 5 L During Mobility: 102 HR  Post Mobility: 82 HR , 93% SpO2 5 L  Pt received in chair, agreeable to mobility. Prior to standing pt c/o back pain but eager to ambulate. Pt able to tolerate pain throughout session. ModA+2 to stand. Pt requesting to use BSC before ambulation. Void successful. Pt able to ambulate in hallway with CG and chair follow for safety. Pt tends to drift from RW during ambulation requiring verbal cues to stay within walker. During ambulation pt displayed knee buckling requiring seated rest break. Pt unable to continue ambulation and wheeled back in room. Pt left in bed with call bell in hand and all needs met.   Leory Plowman  Mobility Specialist Please contact via [employer] office at [phone]

## 2022-09-08 NOTE — TOC Initial Note (Signed)
Transition of Care [facility]) - Initial/Assessment Note    Patient Details  Name: Linda Chase MRN: [id number] Date of Birth: 1955-02-03  Transition of Care [facility]) CM/SW Contact:    Kingsley Plan, RN Phone Number: 09/08/2022, 11:15 AM  Clinical Narrative:                  NCM and SW spoke to patient at bedside. Discussed PT recommendation for short term rehab at [facility]. Patient declined. Patient stated she wants to go home at discahrge from hospital and have HHPT. NCM disccussed PT note with patient, requiring 2 plus max assist. Patient reported she lives with her son Jill Alexanders and he can assist. Jill Alexanders works second shift and when he is at work she has a friend who can assist.   NCM explained HHPT would only be in the home a couple times a week for a hour at a time.   Patient using walker at hospital , does not have one at home and does not want one. Patient would like a bedside commode for patient. Patient currently on oxygen , does not have home oxygen, and does not want home oxygen.   NCM asked if NCM could call Jill Alexanders and discuss above. Patient agreed . Patient called Jill Alexanders from her cell phone and placed him on speaker. Discussed all of above. Jill Alexanders would prefer patient go to SNF for short term rehab before coming home.   Patient agreed. TOC team will complete FL2 and send out to SNF. Once bed offers received will provide bed offers to patient with medicare ratings. Patient and son voiced understanding.   Patient has never been to SNF before. She will speak to some of her friends who have.    Expected Discharge Plan: Home w Home Health Services Barriers to Discharge: Continued Medical Work up   Patient Goals and CMS Choice Patient states their goals for this hospitalization and ongoing recovery are:: to return to home CMS Medicare.gov Compare Post Acute Care list provided to:: Patient Choice offered to / list presented to : Patient [place] ownership interest in [facility].provided to:: Patient    Expected Discharge Plan and Services   Discharge Planning Services: CM Consult Post Acute Care Choice: Home Health Living arrangements for the past 2 months: Apartment                 DME Arranged:  (see note)                    Prior Living Arrangements/Services Living arrangements for the past 2 months: Apartment Lives with:: Adult Children Patient language and need for interpreter reviewed:: Yes Do you feel safe going back to the place where you live?: Yes      Need for Family Participation in Patient Care: Yes (Comment) Care giver support system in place?: Yes (comment)   Criminal Activity/Legal Involvement Pertinent to Current Situation/Hospitalization: No - Comment as needed  Activities of Daily Living Home Assistive Devices/Equipment: Grab bars in shower ADL Screening (condition at time of admission) Patient's cognitive ability adequate to safely complete daily activities?: Yes Is the patient deaf or have difficulty hearing?: No Does the patient have difficulty seeing, even when wearing glasses/contacts?: No Does the patient have difficulty concentrating, remembering, or making decisions?: No Patient able to express need for assistance with ADLs?: Yes Does the patient have difficulty dressing or bathing?: No Independently performs ADLs?: Yes (appropriate for developmental age) Does the patient have difficulty walking  or climbing stairs?: No Weakness of Legs: None Weakness of Arms/Hands: None  Permission Sought/Granted   Permission granted to share information with : Yes, Verbal Permission Granted  Share Information with NAME: son Chihiro Ganzer [phone]           Emotional Assessment Appearance:: Appears stated age Attitude/Demeanor/Rapport: Self-Confident Affect (typically observed): Accepting Orientation: : Oriented to Self, Oriented to Place, Oriented to  Time, Oriented to Situation Alcohol / Substance Use:  Not Applicable Psych Involvement: No (comment)  Admission diagnosis:  Pulmonary nodule [R91.1] Spinal stenosis of lumbar region without neurogenic claudication [M48.061] Acute respiratory failure with hypercapnia (HCC) [J96.02] Atrial fibrillation with RVR (HCC) [I48.91] Patient Active Problem List   Diagnosis Date Noted   Lower back pain 09/06/2022   Leukocytosis 09/06/2022   Obesity, Class III, BMI 40-49.9 (morbid obesity) (HCC) 09/06/2022   Abdominal wall swelling 09/06/2022   Paroxysmal atrial fibrillation with RVR (HCC) 09/06/2022   Chronic anticoagulation 09/06/2022   Abnormal CT of the abdomen 09/06/2022   Influenza A (H1N1) 02/03/2012   COPD exacerbation (HCC) 02/02/2012   Diabetes mellitus (HCC) 02/01/2012   Acute respiratory failure with hypercapnia (HCC) 02/01/2012   SIRS (systemic inflammatory response syndrome) (HCC) 02/01/2012   Hypertension 02/01/2012   Depression 02/01/2012   Obesity 02/01/2012   PCP:  Rometta Emery, MD Pharmacy:   [id number] - [place]), [place] - [address] [place] ([place]) [place] [address] Phone: [phone] Fax: [phone]     Social Determinants of Health (SDOH) Social History: SDOH Screenings   Tobacco Use: High Risk (09/06/2022)   SDOH Interventions:     Readmission Risk Interventions     No data to display

## 2022-09-08 NOTE — [place] FL2 (Signed)
[place] MEDICAID FL2 LEVEL OF CARE FORM     IDENTIFICATION  Patient Name: Linda Chase Birthdate: [birth year] Sex: female Admission Date (Current Location): 09/06/2022  [facility] and [place] Number:  Producer, television/film/video and Address:  [facility] [place], [address], [place], [place] [address]      Provider Number: [id number]  Attending Physician Name and Address:  Hughie Closs, MD  Relative Name and Phone Number:       Current Level of Care: Hospital Recommended Level of Care: Skilled Nursing Facility Prior Approval Number:    Date Approved/Denied:   PASRR Number: [id number]  Discharge Plan: SNF    Current Diagnoses: Patient Active Problem List   Diagnosis Date Noted   Lower back pain 09/06/2022   Leukocytosis 09/06/2022   Obesity, Class III, BMI 40-49.9 (morbid obesity) (HCC) 09/06/2022   Abdominal wall swelling 09/06/2022   Paroxysmal atrial fibrillation with RVR (HCC) 09/06/2022   Chronic anticoagulation 09/06/2022   Abnormal CT of the abdomen 09/06/2022   Influenza A (H1N1) 02/03/2012   COPD exacerbation (HCC) 02/02/2012   Diabetes mellitus (HCC) 02/01/2012   Acute respiratory failure with hypercapnia (HCC) 02/01/2012   SIRS (systemic inflammatory response syndrome) (HCC) 02/01/2012   Hypertension 02/01/2012   Depression 02/01/2012   Obesity 02/01/2012    Orientation RESPIRATION BLADDER Height & Weight     Self, Time, Situation, Place  O2 ([place] 5) Continent Weight: (!) 300 lb 0.7 oz (136.1 kg) Height:  5\' 4"  (162.6 cm)  BEHAVIORAL SYMPTOMS/MOOD NEUROLOGICAL BOWEL NUTRITION STATUS      Continent Diet (See DC summary)  AMBULATORY STATUS COMMUNICATION OF NEEDS Skin   Extensive Assist Verbally Normal                       Personal Care Assistance Level of Assistance  Bathing, Feeding, Dressing Bathing Assistance: Maximum assistance Feeding assistance: Limited assistance Dressing Assistance: Maximum assistance      Functional Limitations Info  Sight, Hearing, Speech Sight Info: Adequate Hearing Info: Adequate Speech Info: Adequate    SPECIAL CARE FACTORS FREQUENCY  PT (By licensed PT), OT (By licensed OT)     PT Frequency: 5x week OT Frequency: 5x week            Contractures Contractures Info: Not present    Additional Factors Info  Code Status, Allergies, Insulin Sliding Scale Code Status Info: Full Allergies Info: Hydrocodone   Insulin Sliding Scale Info: See DC summary       Current Medications (09/08/2022):  This is the current hospital active medication list Current Facility-Administered Medications  Medication Dose Route Frequency Provider Last Rate Last Admin   acetaminophen (TYLENOL) tablet 650 mg  650 mg Oral Q6H PRN Clydie Braun, MD       Or   acetaminophen (TYLENOL) suppository 650 mg  650 mg Rectal Q6H PRN Taelynn Mcelhannon, Rondell A, MD       albuterol (PROVENTIL) (2.5 MG/3ML) 0.083% nebulizer solution 3 mL  3 mL Inhalation PRN Hughie Closs, MD       apixaban (ELIQUIS) tablet 5 mg  5 mg Oral BID Hughie Closs, MD   5 mg at 09/08/22 0927   atorvastatin (LIPITOR) tablet 20 mg  20 mg Oral Daily Hughie Closs, MD   20 mg at 09/08/22 0102   cholecalciferol (VITAMIN D3) 25 MCG (1000 UNIT) tablet 2,000 Units  2,000 Units Oral Daily Hughie Closs, MD   2,000 Units at 09/08/22 [id number]  furosemide (LASIX) tablet 40 mg  40 mg Oral Daily Hughie Closs, MD   40 mg at 09/08/22 0926   gabapentin (NEURONTIN) capsule 300 mg  300 mg Oral QHS Hughie Closs, MD   300 mg at 09/07/22 2118   insulin aspart (novoLOG) injection 0-15 Units  0-15 Units Subcutaneous TID WC Hughie Closs, MD   3 Units at 09/08/22 1224   insulin aspart (novoLOG) injection 0-5 Units  0-5 Units Subcutaneous QHS Pahwani, Daleen Bo, MD       ipratropium-albuterol (DUONEB) 0.5-2.5 (3) MG/3ML nebulizer solution 3 mL  3 mL Nebulization Q6H PRN Madelyn Flavors A, MD       metoprolol succinate (TOPROL-XL) 24 hr tablet 100 mg  100 mg  Oral Daily Pahwani, Daleen Bo, MD   100 mg at 09/08/22 0926   ondansetron (ZOFRAN) tablet 4 mg  4 mg Oral Q6H PRN Madelyn Flavors A, MD       Or   ondansetron (ZOFRAN) injection 4 mg  4 mg Intravenous Q6H PRN Debbora Ang, Rondell A, MD       oxyCODONE (Oxy IR/ROXICODONE) immediate release tablet 5 mg  5 mg Oral Q6H PRN Hughie Closs, MD   5 mg at 09/08/22 1017   revefenacin (YUPELRI) nebulizer solution 175 mcg  175 mcg Nebulization Daily Pahwani, Daleen Bo, MD   175 mcg at 09/08/22 0810   sodium chloride flush (NS) 0.9 % injection 3 mL  3 mL Intravenous Q12H Madelyn Flavors A, MD   3 mL at 09/08/22 4098     Discharge Medications: Please see discharge summary for a list of discharge medications.  Relevant Imaging Results:  Relevant Lab Results:   Additional Information SS# [id number] [place]

## 2022-09-09 ENCOUNTER — Inpatient Hospital Stay (HOSPITAL_COMMUNITY): Payer: Medicare HMO

## 2022-09-09 DIAGNOSIS — J9602 Acute respiratory failure with hypercapnia: Secondary | ICD-10-CM | POA: Diagnosis not present

## 2022-09-09 DIAGNOSIS — I4891 Unspecified atrial fibrillation: Secondary | ICD-10-CM | POA: Diagnosis not present

## 2022-09-09 LAB — CBC WITH DIFFERENTIAL/PLATELET
Abs Immature Granulocytes: 0.1 10*3/uL — ABNORMAL HIGH (ref 0.00–0.07)
Basophils Absolute: 0 10*3/uL (ref 0.0–0.1)
Basophils Relative: 0 %
Eosinophils Absolute: 0.1 10*3/uL (ref 0.0–0.5)
Eosinophils Relative: 1 %
HCT: 36.3 % (ref 36.0–46.0)
Hemoglobin: 10.2 g/dL — ABNORMAL LOW (ref 12.0–15.0)
Immature Granulocytes: 1 %
Lymphocytes Relative: 7 %
Lymphs Abs: 0.8 10*3/uL (ref 0.7–4.0)
MCH: 24 pg — ABNORMAL LOW (ref 26.0–34.0)
MCHC: 28.1 g/dL — ABNORMAL LOW (ref 30.0–36.0)
MCV: 85.4 fL (ref 80.0–100.0)
Monocytes Absolute: 0.6 10*3/uL (ref 0.1–1.0)
Monocytes Relative: 5 %
Neutro Abs: 10.3 10*3/uL — ABNORMAL HIGH (ref 1.7–7.7)
Neutrophils Relative %: 86 %
Platelets: 307 10*3/uL (ref 150–400)
RBC: 4.25 MIL/uL (ref 3.87–5.11)
RDW: 14.8 % (ref 11.5–15.5)
WBC: 11.9 10*3/uL — ABNORMAL HIGH (ref 4.0–10.5)
nRBC: 0 % (ref 0.0–0.2)

## 2022-09-09 LAB — BLOOD GAS, ARTERIAL
Acid-Base Excess: 10 mmol/L — ABNORMAL HIGH (ref 0.0–2.0)
Acid-Base Excess: 12.1 mmol/L — ABNORMAL HIGH (ref 0.0–2.0)
Bicarbonate: 39 mmol/L — ABNORMAL HIGH (ref 20.0–28.0)
Bicarbonate: 40.5 mmol/L — ABNORMAL HIGH (ref 20.0–28.0)
O2 Saturation: 99 %
O2 Saturation: 97.3 %
Patient temperature: 36.4
Patient temperature: 37.7
pCO2 arterial: 72 mmHg (ref 32–48)
pCO2 arterial: 72 mmHg (ref 32–48)
pH, Arterial: 7.34 — ABNORMAL LOW (ref 7.35–7.45)
pH, Arterial: 7.36 (ref 7.35–7.45)
pO2, Arterial: 86 mmHg (ref 83–108)
pO2, Arterial: 110 mmHg — ABNORMAL HIGH (ref 83–108)

## 2022-09-09 LAB — BASIC METABOLIC PANEL
Anion gap: 10 (ref 5–15)
BUN: 9 mg/dL (ref 8–23)
CO2: 30 mmol/L (ref 22–32)
Calcium: 8.3 mg/dL — ABNORMAL LOW (ref 8.9–10.3)
Chloride: 94 mmol/L — ABNORMAL LOW (ref 98–111)
Creatinine, Ser: 0.95 mg/dL (ref 0.44–1.00)
GFR, Estimated: >60 mL/min (ref >60)
Glucose, Bld: 160 mg/dL — ABNORMAL HIGH (ref 70–99)
Potassium: 4 mmol/L (ref 3.5–5.1)
Sodium: 134 mmol/L — ABNORMAL LOW (ref 135–145)

## 2022-09-09 LAB — GLUCOSE, CAPILLARY
Glucose-Capillary: 162 mg/dL — ABNORMAL HIGH (ref 70–99)
Glucose-Capillary: 201 mg/dL — ABNORMAL HIGH (ref 70–99)
Glucose-Capillary: 139 mg/dL — ABNORMAL HIGH (ref 70–99)
Glucose-Capillary: 208 mg/dL — ABNORMAL HIGH (ref 70–99)

## 2022-09-09 LAB — PROCALCITONIN: Procalcitonin: <0.1 ng/mL

## 2022-09-09 LAB — BRAIN NATRIURETIC PEPTIDE: B Natriuretic Peptide: 293.6 pg/mL — ABNORMAL HIGH (ref 0.0–100.0)

## 2022-09-09 MED ORDER — FUROSEMIDE 10 MG/ML IJ SOLN
40.0000 mg | Freq: Two times a day (BID) | INTRAMUSCULAR | Status: DC
  Administered 2022-09-09 – 2022-09-12 (×6): 40 mg via INTRAVENOUS
  Filled 2022-09-09 (×6): qty 4

## 2022-09-09 MED ORDER — FUROSEMIDE 10 MG/ML IJ SOLN
40.0000 mg | Freq: Two times a day (BID) | INTRAMUSCULAR | Status: DC
  Filled 2022-09-09: qty 4

## 2022-09-09 MED ORDER — DILTIAZEM HCL-DEXTROSE 125-5 MG/125ML-% IV SOLN (PREMIX)
5.0000 mg/h | INTRAVENOUS | Status: DC
  Administered 2022-09-09: 10 mg/h via INTRAVENOUS
  Filled 2022-09-09: qty 125

## 2022-09-09 NOTE — Care Management Important Message (Signed)
Important Message  Patient Details  Name: Linda Chase MRN: [id number] Date of Birth: 09-14-1955   Medicare Important Message Given:  Yes     Dorena Bodo 09/09/2022, 2:52 PM

## 2022-09-09 NOTE — Progress Notes (Signed)
PROGRESS NOTE    Linda Chase  KGM:[id number] DOB: 03-Oct-1955 DOA: 09/06/2022 PCP: Rometta Emery, MD   Brief Narrative:  Linda Chase is a 67 y.o. female with medical history significant of hypertension, paroxysmal atrial fibrillation on chronic anticoagulation,  diabetes mellitus type 2, cerebral aneurysm, and morbid obesity who presented with complaints of lower back pain.  Pain started while she was out of town last month.  She went to [place] via train and came back possibly at the beginning of this month.  She reports pain was severe and she was having associated spasms.  She has been taking muscle relaxants and naproxen for the symptoms not relieved.  She reported associated symptoms of urinary incontinence related with pain and difficulty ambulating.  Denied any recent trauma, falls, cough, fever, vomiting, or diarrhea.  She reports that she has been taking her Eliquis as prescribed.     In the emergency department patient was noted to be afebrile with heart rates into the 130s in atrial fibrillation, blood pressures maintained, O2 saturations as low as 81% with improvement on 3 L of nasal cannula oxygen.  Venous blood gas noted pH 7.234 with pCO2 80.2, and pO2 29.  Patient was placed on BiPAP.  Labs significant for WBC 11 and hemoglobin 11.  Chest x-ray noted no acute abnormality.  CT scan of the abdomen and pelvis noted 1.6 nodular opacity in the right lower lobe and 6 mm nodule in the right upper lobe, cardiomegaly with calcified CAD and aortic atherosclerosis, small hiatal hernia, constipation and diverticulosis, lower abdominal wall stranding which could be part of the bilateral body wall edema or cellulitis, nonobstructive right nephrolithiasis, advanced L4-L5 facet hypertrophy with acquired spinal canal stenosis and grade 1 L4-L5 spondylolisthesis, and small focal and inguinal fat hernias.  Patient has been given DuoNeb breathing treatment, Dilaudid 1 mg IM, and started  on Cardizem drip.  Details below.  Assessment & Plan:   Principal Problem:   Acute respiratory failure with hypercapnia (HCC) Active Problems:   Lower back pain   Leukocytosis   Abdominal wall swelling   Paroxysmal atrial fibrillation with RVR (HCC)   Chronic anticoagulation   Hypertension   Diabetes mellitus (HCC)   Abnormal CT of the abdomen   Obesity, Class III, BMI 40-49.9 (morbid obesity) (HCC)  Acute respiratory failure with hypercapnia and respiratory acidosis: ABG noted pH to be 7.32 with pCO2 61, and pO2 118.  She was started on BiPAP.  Chest x-ray negative.  CT abdomen pelvis shows pulmonary nodules.  Patient improved and was weaned to 5 L but has been on 4 to 5 L since yesterday.  She is slightly lethargic today.  Repeat ABG today once again shows pCO2 of 72.  We are going to start her on BiPAP again.  I have ordered chest x-ray.    Acute on chronic lower back pain due to L4-5 high-grade spinal stenosis:  CT noted advanced L4-L5 facet hypertrophy with acquired spinal canal stenosis and grade 1 L4-L5 spondylolisthesis.  Seen by Dr. Jordan Likes from neurosurgery who does not recommend any intervention.  Recommends PT OT.  Seen by PT OT and recommend SNF.  Continue as needed oxycodone.  Pain is improving.   Leukocytosis: Resolved.  No signs of infection.  Procalcitonin negative.   Lower abdominal wall swelling  Acute.  CT imaging had noted lower abdominal wall stranding thought to either possibly be cellulitis or associated with lower extremity edema.  No clinical signs of cellulitis.  Possible acute on chronic diastolic congestive heart failure: I was able to hear some crackles at the bases bilaterally, although hard to tell due to her body habitus.  Chest x-ray was negative, repeating chest x-ray.  BNP slightly elevated.  She does take Lasix and she is on 40 mg p.o. daily.  She appears volume overloaded on physical examination with +2 pitting edema bilateral lower extremity.  I will  start her on Lasix IV 40 mg twice daily.  Repeating BNP and chest x-ray as well as procalcitonin to rule out pneumonia.  Cardiology is consulted.  Will see what their recommendations are.  Paroxysmal atrial fibrillation with RVR on chronic anticoagulation On admission patient was noted to be in atrial fibrillation with heart rates into the 130s.  Started on Cardizem drip, Toprol-XL increased from 50-100 p.o. daily on 09/08/2022, was taken off of Cardizem drip in the afternoon but then had to be started back on it early morning today at 5 AM due to heart rate of 130s.  Patient remains asymptomatic.  EF 50% on recent echo.  I have now consulted cardiology to help manage atrial fibrillation.  Continue Eliquis.   Fever/possible T8-9 discitis/osteomyelitis: Patient has had 2 episodes of low-grade fever with Tmax of 100.9 at around 11 PM last night.  CT chest shows destructive endplate changes at T8-T9 with suspected paravertebral soft tissue raising possibility of discitis and osteomyelitis.  MRI of the thoracic spine with and without contrast will be ordered.  I have ordered blood cultures as well.   Essential hypertension Blood pressures on low side, continue to hold amlodipine and hydrochlorothiazide.   Diabetes mellitus type 2, without long-term use of insulin On admission glucose was noted to be 129.  Home medication regimen includes metformin and Januvia, both on hold.  Hemoglobin A1c 7.8.  On SSI currently and blood sugar controlled.   Abnormal CT abdomen and pelvis: CT also noted 1.6 nodular opacity in the right lower lobe and 6 mm nodule in the right upper lobe, nonobstructive right sided nephrolithiasis, small focal and inguinal fat hernias, and cardiomegaly with calcified coronary artery disease and aortic atherosclerosis.  Prompt CT chest is recommended by radiology.  CT chest ruled out pulmonary nodules but showed incidental possible osteomyelitis of T8/T9.   Morbid obesity/obstructive sleep  apnea. BMI 53.21 kg/m.  Patient does endorse history of sleep apnea but does not use any CPAP machine.  Weight loss and diet modification counseled and recommended that she has another sleep study updated as outpatient and use CPAP.  DVT prophylaxis: Eliquis   Code Status: Full Code  Family Communication:  None present at bedside.  Plan of care discussed with patient in length and he/she verbalized understanding and agreed with it.  Status is: Inpatient Remains inpatient appropriate because: Still on Cardizem drip.  Will need discharge to SNF once medically stable.   Estimated body mass index is 49.61 kg/m as calculated from the following:   Height as of this encounter: 5\' 4"  (1.626 m).   Weight as of this encounter: 131.1 kg.    Nutritional Assessment: Body mass index is 49.61 kg/m.Marland Kitchen Seen by dietician.  I agree with the assessment and plan as outlined below: Nutrition Status:        . Skin Assessment: I have examined the patient's skin and I agree with the wound assessment as performed by the wound care RN as outlined below:    Consultants:  None  Procedures:  None  Antimicrobials:  Anti-infectives (From admission,  onward)    None         Subjective: Patient seen and examined.  She is sleepy and keeps falling asleep while talking to me although she is sitting at the edge of the bed.  She denies any complaint.  Not having back pain today.  No chest pain or shortness of breath.  Objective: Vitals:   09/08/22 2243 09/09/22 0426 09/09/22 0756 09/09/22 0828  BP: 122/79 (!) 104/57 (!) 105/59   Pulse:  (!) 120 (!) 105 (!) 135  Resp: 20 19 15    Temp: (!) 100.7 F (38.2 C) 99.6 F (37.6 C) 97.6 F (36.4 C)   TempSrc: Oral Oral Axillary   SpO2: 99% 98% 96%   Weight:  131.1 kg    Height:        Intake/Output Summary (Last 24 hours) at 09/09/2022 0958 Last data filed at 09/09/2022 0757 Gross per 24 hour  Intake 307.85 ml  Output 250 ml  Net 57.85 ml   Filed  Weights   09/07/22 0156 09/08/22 0421 09/09/22 0426  Weight: (!) 136.2 kg (!) 136.1 kg 131.1 kg    Examination:  General exam: Appears calm and comfortable, morbidly obese Respiratory system: Crackles. Respiratory effort normal. Cardiovascular system: S1 & S2 heard, RRR. No JVD, murmurs, rubs, gallops or clicks.  +2 pitting edema bilateral lower extremity. Gastrointestinal system: Abdomen is nondistended, soft and nontender. No organomegaly or masses felt. Normal bowel sounds heard. Central nervous system: Sleepy but easily arousable and oriented.  No focal deficit. Extremities: Symmetric 5 x 5 power. Skin: No rashes, lesions or ulcers.  Psychiatry: Judgement and insight appear normal. Mood & affect appropriate. Data Reviewed: I have personally reviewed following labs and imaging studies  CBC: Recent Labs  Lab 09/06/22 0237 09/06/22 0442 09/07/22 0431 09/08/22 0338  WBC 11.0*  --  9.9 11.7*  NEUTROABS 8.9*  --   --  8.8*  HGB 11.0* 12.2 10.1* 10.7*  HCT 37.1 36.0 36.2 37.8  MCV 83.9  --  86.0 87.5  PLT 355  --  290 277   Basic Metabolic Panel: Recent Labs  Lab 09/06/22 0237 09/06/22 0442 09/06/22 1349 09/07/22 0431 09/08/22 0338 09/09/22 0646  NA 138 139  --  137 140 134*  K 3.8 3.9  --  3.8 4.2 4.0  CL 101  --   --  99 95* 94*  CO2 31  --   --  29 33* 30  GLUCOSE 129*  --   --  167* 139* 160*  BUN 12  --   --  8 9 9   CREATININE 0.89  --   --  0.80 1.03* 0.95  CALCIUM 9.3  --   --  8.0* 8.6* 8.3*  MG  --   --  2.1  --   --   --    GFR: Estimated Creatinine Clearance: 78.4 mL/min (by C-G formula based on SCr of 0.95 mg/dL). Liver Function Tests: Recent Labs  Lab 09/06/22 0237  AST 16  ALT 12  ALKPHOS 68  BILITOT 0.4  PROT 7.8  ALBUMIN 3.8   No results for input(s): "LIPASE", "AMYLASE" in the last 168 hours. No results for input(s): "AMMONIA" in the last 168 hours. Coagulation Profile: No results for input(s): "INR", "PROTIME" in the last 168  hours. Cardiac Enzymes: No results for input(s): "CKTOTAL", "CKMB", "CKMBINDEX", "TROPONINI" in the last 168 hours. BNP (last 3 results) No results for input(s): "PROBNP" in the last 8760 hours. HbA1C: Recent Labs  09/07/22 0431  HGBA1C 7.8*   CBG: Recent Labs  Lab 09/08/22 0606 09/08/22 1110 09/08/22 1729 09/08/22 2110 09/09/22 0602  GLUCAP 140* 180* 173* 164* 162*   Lipid Profile: No results for input(s): "CHOL", "HDL", "LDLCALC", "TRIG", "CHOLHDL", "LDLDIRECT" in the last 72 hours. Thyroid Function Tests: Recent Labs    09/06/22 1349  TSH 1.776   Anemia Panel: No results for input(s): "VITAMINB12", "FOLATE", "FERRITIN", "TIBC", "IRON", "RETICCTPCT" in the last 72 hours. Sepsis Labs: Recent Labs  Lab 09/06/22 1349  PROCALCITON <0.10    No results found for this or any previous visit (from the past 240 hour(s)).   Radiology Studies: CT CHEST W CONTRAST  Result Date: 09/08/2022 CLINICAL DATA:  Pulmonary nodules EXAM: CT CHEST WITH CONTRAST TECHNIQUE: Multidetector CT imaging of the chest was performed during intravenous contrast administration. RADIATION DOSE REDUCTION: This exam was performed according to the departmental dose-optimization program which includes automated exposure control, adjustment of the mA and/or kV according to patient size and/or use of iterative reconstruction technique. CONTRAST:  75mL OMNIPAQUE IOHEXOL 350 MG/ML SOLN COMPARISON:  Chest radiograph dated 09/06/2018. CT chest dated 02/01/2012. FINDINGS: Cardiovascular: The heart is top-normal in size. No pericardial effusion. No evidence of thoracic aneurysm. Atherosclerotic calcifications of the aortic arch. Moderate coronary atherosclerosis of the LAD and left coronary artery. Mediastinum/Nodes: Small mediastinal nodes, including a 17 mm short axis low right paratracheal node (series 3/image 84) and 14 mm short axis right hilar/perihilar nodes. Visualized thyroid is unremarkable. Lungs/Pleura:  Evaluation of the lung parenchyma is constrained by respiratory motion. Within that constraint, there are no suspicious pulmonary nodules. Specifically, the right basilar nodular opacity noted on recent prior CT is not evident on the current study. Mild patchy right lower lobe opacity, favoring atelectasis, with suspected trace right pleural effusion. New 10 mm opacity at the medial left lung base likely reflects additional atelectasis rather than a true nodule given rapid interval change. No pneumothorax. Upper Abdomen: Visualized upper abdomen is grossly unremarkable, noting vascular calcifications. Musculoskeletal: Degenerative changes of the visualized thoracolumbar spine. Destructive endplate changes at T8-9 with suspected paravertebral soft tissue (series 3/image 83), raising the possibility of discitis osteomyelitis. IMPRESSION: Destructive endplate changes at T8-9 with suspected paravertebral soft tissue, raising the possibility of discitis osteomyelitis. MRI of the thoracic spine with and without contrast is suggested for further evaluation. No suspicious pulmonary nodules. Specifically, the right basilar nodular opacity noted on recent prior CT is not evident on the current study. Mild patchy right lower lobe opacity, favoring atelectasis, with suspected trace right pleural effusion. Small mediastinal and right hilar/perihilar nodes, possibly reactive. Follow-up CT chest is suggested in 3 months. Aortic Atherosclerosis (ICD10-I70.0). Electronically Signed   By: Charline Bills M.D.   On: 09/08/2022 16:48   ECHOCARDIOGRAM COMPLETE  Result Date: 09/07/2022    ECHOCARDIOGRAM REPORT   Patient Name:   Karle Starch Date of Exam: 09/07/2022 Medical Rec #:  [id number]           Height:       64.0 in Accession #:    [id number]          Weight:       300.2 lb Date of Birth:  [birth year]          BSA:          2.325 m Patient Age:    66 years            BP:  111/78 mmHg Patient Gender: F                    HR:           101 bpm. Exam Location:  Inpatient Procedure: 2D Echo, 3D Echo, Cardiac Doppler and Color Doppler Indications:    I48.91* Unspeicified atrial fibrillation  History:        Patient has prior history of Echocardiogram examinations, most                 recent 02/02/2012. Abnormal ECG, COPD, Arrythmias:Atrial                 Fibrillation; Risk Factors:Diabetes and Hypertension.  Sonographer:    Sheralyn Boatman RDCS Referring Phys: Clydie Braun  Sonographer Comments: Patient is obese. Image acquisition challenging due to patient body habitus. Study delayed 20 minutes to get patient back in bed IMPRESSIONS  1. Left ventricular ejection fraction, by estimation, is 50%. Left ventricular ejection fraction by 3D volume is 49 %. The left ventricle has mildly decreased function. The left ventricle demonstrates global hypokinesis. There is mild left ventricular hypertrophy. Left ventricular diastolic parameters are indeterminate. There is the interventricular septum is flattened in systole, consistent with right ventricular pressure overload.  2. Right ventricular systolic function is mildly reduced. The right ventricular size is mildly enlarged. Mildly increased right ventricular wall thickness. There is mildly elevated pulmonary artery systolic pressure. The estimated right ventricular systolic pressure is 44.8 mmHg.  3. Left atrial size was mildly dilated.  4. Right atrial size was mildly dilated.  5. The mitral valve is grossly normal. Trivial mitral valve regurgitation. No evidence of mitral stenosis. Moderate mitral annular calcification.  6. There is one beat of systolic flow reversal of hepatic vein spectral Doppler. Tricuspid valve regurgitation is moderate to severe.  7. Nodular calcification of leaflet tips. The aortic valve is tricuspid. There is mild calcification of the aortic valve. Aortic valve regurgitation is trivial. No aortic stenosis is present.  8. Aortic dilatation noted. There is mild  dilatation of the ascending aorta, measuring 42 mm.  9. The inferior vena cava is dilated in size with <50% respiratory variability, suggesting right atrial pressure of 15 mmHg. Comparison(s): Unable to view prior study. FINDINGS  Left Ventricle: Left ventricular ejection fraction, by estimation, is 50%. Left ventricular ejection fraction by 3D volume is 49 %. The left ventricle has mildly decreased function. The left ventricle demonstrates global hypokinesis. The left ventricular internal cavity size was normal in size. There is mild left ventricular hypertrophy. The interventricular septum is flattened in systole, consistent with right ventricular pressure overload. Left ventricular diastolic parameters are indeterminate. Right Ventricle: The right ventricular size is mildly enlarged. Mildly increased right ventricular wall thickness. Right ventricular systolic function is mildly reduced. There is mildly elevated pulmonary artery systolic pressure. The tricuspid regurgitant velocity is 2.73 m/s, and with an assumed right atrial pressure of 15 mmHg, the estimated right ventricular systolic pressure is 44.8 mmHg. Left Atrium: Left atrial size was mildly dilated. Right Atrium: Right atrial size was mildly dilated. Pericardium: There is no evidence of pericardial effusion. Mitral Valve: The mitral valve is grossly normal. Moderate mitral annular calcification. Trivial mitral valve regurgitation. No evidence of mitral valve stenosis. Tricuspid Valve: There is one beat of systolic flow reversal of hepatic vein spectral Doppler. The tricuspid valve is grossly normal. Tricuspid valve regurgitation is moderate to severe. No evidence of tricuspid stenosis. Aortic Valve: Nodular calcification of leaflet  tips. The aortic valve is tricuspid. There is mild calcification of the aortic valve. Aortic valve regurgitation is trivial. No aortic stenosis is present. Pulmonic Valve: The pulmonic valve was normal in structure. Pulmonic  valve regurgitation is not visualized. No evidence of pulmonic stenosis. Aorta: Aortic dilatation noted. There is mild dilatation of the ascending aorta, measuring 42 mm. Venous: The inferior vena cava is dilated in size with less than 50% respiratory variability, suggesting right atrial pressure of 15 mmHg. IAS/Shunts: No atrial level shunt detected by color flow Doppler.  LEFT VENTRICLE PLAX 2D LVIDd:         4.60 cm LVIDs:         3.50 cm LV PW:         1.60 cm         3D Volume EF LV IVS:        1.20 cm         LV 3D EF:    Left                                             ventricul                                             ar LV Volumes (MOD)                            ejection LV vol d, MOD    69.4 ml                    fraction A2C:                                        by 3D LV vol d, MOD    64.8 ml                    volume is A4C:                                        49 %. LV vol s, MOD    28.0 ml A2C: LV vol s, MOD    29.9 ml       3D Volume EF: A4C:                           3D EF:        49 % LV SV MOD A2C:   41.4 ml       LV EDV:       170 ml LV SV MOD A4C:   64.8 ml       LV ESV:       87 ml LV SV MOD BP:    39.9 ml       LV SV:        83 ml RIGHT VENTRICLE             IVC RV S prime:     13.30 cm/s  IVC diam: 2.70  cm RVOT diam:      2.90 cm TAPSE (M-mode): 1.9 cm LEFT ATRIUM             Index        RIGHT ATRIUM           Index LA diam:        4.90 cm 2.11 cm/m   RA Area:     24.80 cm LA Vol (A2C):   64.5 ml 27.74 ml/m  RA Volume:   78.20 ml  33.64 ml/m LA Vol (A4C):   68.1 ml 29.29 ml/m LA Biplane Vol: 67.9 ml 29.20 ml/m  AORTIC VALVE LVOT Vmax:   136.50 cm/s LVOT Vmean:  93.500 cm/s LVOT VTI:    0.210 m  AORTA Ao Root diam: 3.10 cm Ao Asc diam:  4.15 cm MITRAL VALVE                TRICUSPID VALVE MV Area (PHT): 3.99 cm     TR Peak grad:   29.8 mmHg MV Decel Time: 190 msec     TR Vmax:        273.00 cm/s MV E velocity: 134.00 cm/s                             SHUNTS                              Systemic VTI:  0.21 m                             Pulmonic Diam: 2.90 cm Riley Lam MD Electronically signed by Riley Lam MD Signature Date/Time: 09/07/2022/1:29:37 PM    Final     Scheduled Meds:  apixaban  5 mg Oral BID   atorvastatin  20 mg Oral Daily   cholecalciferol  2,000 Units Oral Daily   furosemide  40 mg Intravenous BID   gabapentin  300 mg Oral QHS   insulin aspart  0-15 Units Subcutaneous TID WC   insulin aspart  0-5 Units Subcutaneous QHS   metoprolol succinate  100 mg Oral Daily   revefenacin  175 mcg Nebulization Daily   sodium chloride flush  3 mL Intravenous Q12H   Continuous Infusions:  diltiazem (CARDIZEM) infusion 10 mg/hr (09/09/22 0831)     LOS: 3 days   Hughie Closs, MD [facility]  09/09/2022, 9:58 AM   *Please note that this is a verbal dictation therefore any spelling or grammatical errors are due to the "Dragon [employer] One" system interpretation.  Please page via Amion and do not message via secure chat for urgent patient care matters. Secure chat can be used for non urgent patient care matters.  How to contact the [facility] Attending or Consulting provider 7A - 7P or covering provider during after hours 7P -7A, for this patient?  Check the care team in [facility] and look for a) attending/consulting [facility] provider listed and b) the [facility] team listed. Page or secure chat 7A-7P. Log into [email or web address] and use [place]'s universal password to access. If you do not have the password, please contact the hospital operator. Locate the [facility] provider you are looking for under [facility] and page to a number that you can be directly reached. If you still have difficulty reaching the provider, please page the [facility] (Director on Call) for the Hospitalists  listed on amion for assistance.

## 2022-09-09 NOTE — Progress Notes (Signed)
PT Cancellation Note  Patient Details Name: Linda Chase MRN: [id number] DOB: Jan 11, 1956   Cancelled Treatment:    Reason Eval/Treat Not Completed: Medical issues which prohibited therapy. Pt very lethargic with high pCO2 and started on bipap.   Angelina Ok [facility] 09/09/2022, 1:09 PM Skip Mayer PT Acute [employer] [phone]

## 2022-09-09 NOTE — Progress Notes (Signed)
OT Cancellation Note  Patient Details Name: Dauna Baumgartner MRN: [id number] DOB: 1955-08-19   Cancelled Treatment:    Reason Eval/Treat Not Completed: Medical issues which prohibited therapy.  Pt now on Bipap and more lethargic per nursing with elevated pCO2.  Will hold OT treatment for today and will check back tomorrow for appropriateness.   Dalila Arca OTR/L 09/09/2022, 1:12 PM

## 2022-09-09 NOTE — Progress Notes (Signed)
Patient reevaluated today.  Sitting up at the bedside eating breakfast.  She denies severe back pain at present.  She denies any radiating pains into her lower extremities.  She is not particularly cooperative with questioning.  Her motor and sensory function are intact.  Straight raising is negative bilaterally.  Patient with significant lower lumbar degenerative disease complicated by obesity and recent respiratory failure.  I would recommend continue mobilization with physical therapy.  Pain control either with anti-inflammatory medications or analgesics.  Follow-up with me in the office in a few weeks.

## 2022-09-09 NOTE — Plan of Care (Signed)
  Problem: Education: Goal: Knowledge of General Education information will improve Description: Including pain rating scale, medication(s)/side effects and non-pharmacologic comfort measures Outcome: Progressing   Problem: Health Behavior/Discharge Planning: Goal: Ability to manage health-related needs will improve Outcome: Progressing   Problem: Clinical Measurements: Goal: Ability to maintain clinical measurements within normal limits will improve Outcome: Progressing Goal: Will remain free from infection Outcome: Progressing Goal: Diagnostic test results will improve Outcome: Progressing Goal: Respiratory complications will improve Outcome: Progressing Goal: Cardiovascular complication will be avoided Outcome: Progressing   Problem: Nutrition: Goal: Adequate nutrition will be maintained Outcome: Progressing   Problem: Coping: Goal: Level of anxiety will decrease Outcome: Progressing   Problem: Elimination: Goal: Will not experience complications related to bowel motility Outcome: Progressing Goal: Will not experience complications related to urinary retention Outcome: Progressing   Problem: Pain Managment: Goal: General experience of comfort will improve Outcome: Progressing   Problem: Safety: Goal: Ability to remain free from injury will improve Outcome: Progressing   Problem: Skin Integrity: Goal: Risk for impaired skin integrity will decrease Outcome: Progressing   Problem: Education: Goal: Ability to describe self-care measures that may prevent or decrease complications (Diabetes Survival Skills Education) will improve Outcome: Progressing Goal: Individualized Educational Video(s) Outcome: Progressing   Problem: Coping: Goal: Ability to adjust to condition or change in health will improve Outcome: Progressing   Problem: Fluid Volume: Goal: Ability to maintain a balanced intake and output will improve Outcome: Progressing   Problem: Health  Behavior/Discharge Planning: Goal: Ability to identify and utilize available resources and services will improve Outcome: Progressing Goal: Ability to manage health-related needs will improve Outcome: Progressing   Problem: Metabolic: Goal: Ability to maintain appropriate glucose levels will improve Outcome: Progressing   Problem: Nutritional: Goal: Maintenance of adequate nutrition will improve Outcome: Progressing Goal: Progress toward achieving an optimal weight will improve Outcome: Progressing   Problem: Skin Integrity: Goal: Risk for impaired skin integrity will decrease Outcome: Progressing   Problem: Tissue Perfusion: Goal: Adequacy of tissue perfusion will improve Outcome: Progressing

## 2022-09-09 NOTE — Progress Notes (Signed)
Patient refusing AM labs.

## 2022-09-09 NOTE — Plan of Care (Signed)

## 2022-09-09 NOTE — Consult Note (Addendum)
Cardiology Consultation   Patient ID: Linda Chase MRN: [id number]; DOB: [birth year]  Admit date: 09/06/2022 Date of Consult: 09/09/2022  PCP:  Rometta Emery, MD   [facility] Providers Cardiologist:  New (Dr. Tenny Craw)  Patient Profile:   Linda Chase is a 67 y.o. female with a history of paroxysmal atrial fibrillation on Eliquis, hypertension, type 2 diabetes mellitus, obstructive sleep apnea not on CPAP, cerebral aneurysm, depression, back pain, and morbid obesity who is being seen 09/09/2022 for the evaluation of atrial fibrillation with RVR and possible CHF at the request of Dr, Jacqulyn Bath.  History of Present Illness:   Linda Chase is a 67 year old female with the above history. Patient presented to the ED on 09/06/2022 for further evaluation of acute on chronic back pain. Upon arrival to the ED, she was noted to be tachycardic and hypoxic with O2 sats as low as 81% that improved with 3L of O2 via nasal cannula. EKG showed atrial fibrillation, rate 133 bpm, with non-specific ST/T changes. BNP 221. D-dimer negative for age.  Chest x-ray showed no acute findings. WBC 11.0, Hgb 11.0, Plts 355. Na 138, K 3.8, Glucose 129, BUN 12, Cr 0.89. LFTs normal. Venous blood gas showed pH of 7.234, pCO2 80.2, pO2 29, and Bicarb 34.0. She was placed on BiPAP. CT showed cardiomegaly wit calcific CAD and aortic atherosclerosis, 1.6cm nodular opacity in the right lower lobe and 6mm nodule in the right upper lobe, small hiatal hernia, non-obstructive right-sided nephrolithiasis, bilateral inguinal chain adenopathy, lower abdominal wall stranding (which could represent body wall edema or cellulitis), small umbilical and inguinal fat hernia, mild hepatic steatosis, and advanced L4-L5 facet hypertrophy with acquired spinal canal and grade 1 spondylolisthesis. CT of spine also showed generalized lumber spine degeneration especially affecting the lower lumbar facets with L4-5 and L5-S1  anterolisthesis, high grade L4-5 spinal stenosis, and suspected foraminal impingement at both L3-4 and L4-5. He was given IV Dilaudid for pain control and started on IV Cardizem. Echo showed LVEF of 50% with global hypokinesis, mildly enlarged RV with mildly reduced RV function and a flattened interventricular septum in systole consistent with RV pressure overload, mildly elevated PASP of 44.8 mmHg, moderate MAC with only trivial MR, moderate to severe TR, and mild dilatation of the ascending aorta measuring 42 mm. Neurosurgery was consulted and recommended continued mobilization with PT and pain control and then outpatient follow-up. She developed a low grade fever overnight and there was concern for possible discitis and osteomyelitis so MRI of thoracic spine and blood cultures were ordered and are pending.  Cardiology consulted for assistance with atrial fibrillation and possible CHF.  At the time of this evaluation, patient is back on BiPAP due to pCO2 of 72 on ABG this morning. She is somnolent and it is very difficult to get a history. She will wake up brief and answer questions with one or two words but difficult to get any details. She recently moved here from [place]. She denies any prior cardiac history including atrial fibrillation or CHF and takes she did not see a Development worker, international aid in [place]. She has Eliquis and Lasix listed on her PTA medications and when asked what each of these were for, she said they were both for "fluid." She presented with back pain but denied any chest pain or shortness of breath prior to admission. Unable to get any additional history. No family at bedside. Per H&P, it sounds like patient has a history of low back pain  but it has been worse since the beginning of the both after coming back form [place] via train. She was having severe back pain with associated spasm, urinary incontinence, and difficulty ambulating.   Of note, she also has a history of cerebral aneurysms listed in her chart.  She states she has never had surgery for this (although brain surgery is listed under past surgical history) and states she does not follow with anyone for this.   Past Medical History:  Diagnosis Date   Arthritis    Cerebral aneurysm    x2   Depression    Diabetes mellitus    Hypertension     Past Surgical History:  Procedure Laterality Date   BRAIN SURGERY     double aneurysm     Home Medications:  Prior to Admission medications   Medication Sig Start Date End Date Taking? Authorizing Provider  albuterol (VENTOLIN HFA) 108 (90 Base) MCG/ACT inhaler Inhale 1-2 puffs into the lungs as needed for wheezing or shortness of breath. 03/18/22  Yes [provider]  amLODipine (NORVASC) 5 MG tablet Take 1 tablet (5 mg total) by mouth daily. Patient taking differently: Take 5 mg by mouth every evening. 02/05/12  Yes Tat, Onalee Hua, MD  apixaban (ELIQUIS) 5 MG TABS tablet Take 5 mg by mouth 2 (two) times daily.   Yes [provider]  atorvastatin (LIPITOR) 20 MG tablet Take 20 mg by mouth daily.   Yes [provider]  Calcium-Phosphorus-Vitamin D (CALCIUM/VITAMIN D3/ADULT GUMMY PO) GUMMY VITAMIN D 2000 06/15/17  Yes [provider]  cholecalciferol (VITAMIN D) 1000 units tablet Take 2,000 Units by mouth daily. Gummies   Yes [provider]  furosemide (LASIX) 40 MG tablet Take 40 mg by mouth daily. 06/23/22  Yes [provider]  gabapentin (NEURONTIN) 300 MG capsule Take 300 mg by mouth at bedtime.   Yes [provider]  hydrochlorothiazide (HYDRODIURIL) 25 MG tablet Take 25 mg by mouth every morning. 07/31/15  Yes [provider]  JANUVIA 25 MG tablet Take 25 mg by mouth daily. 06/23/22  Yes [provider]  metFORMIN (GLUCOPHAGE) 1000 MG tablet Take 1,000 mg by mouth 2 (two) times daily with a meal.   Yes [provider]  metoprolol succinate (TOPROL-XL) 25 MG 24 hr tablet Take 25 mg by mouth 2 (two) times daily.  06/23/22  Yes [provider]  SPIRIVA HANDIHALER 18 MCG inhalation capsule Place 18 mcg into inhaler and inhale daily. 06/23/22  Yes [provider]  potassium chloride (KLOR-CON M) 10 MEQ tablet Take 10 mEq by mouth daily. Patient not taking: Reported on 09/06/2022 03/18/22   [provider]    Inpatient Medications: Scheduled Meds:  apixaban  5 mg Oral BID   atorvastatin  20 mg Oral Daily   cholecalciferol  2,000 Units Oral Daily   furosemide  40 mg Intravenous BID   gabapentin  300 mg Oral QHS   insulin aspart  0-15 Units Subcutaneous TID WC   insulin aspart  0-5 Units Subcutaneous QHS   metoprolol succinate  100 mg Oral Daily   revefenacin  175 mcg Nebulization Daily   sodium chloride flush  3 mL Intravenous Q12H   Continuous Infusions:  diltiazem (CARDIZEM) infusion 5 mg/hr (09/09/22 1052)   PRN Meds: acetaminophen **OR** acetaminophen, albuterol, ipratropium-albuterol, ondansetron **OR** ondansetron (ZOFRAN) IV, oxyCODONE  Allergies:    Allergies  Allergen Reactions   Hydrocodone Nausea And Vomiting    Social History:   Social  History   Socioeconomic History   Marital status: Legally Separated    Spouse name: Not on file   Number of children: Not on file   Years of education: Not on file   Highest education level: Not on file  Occupational History   Not on file  Tobacco Use   Smoking status: Some Days    Types: Cigarettes   Smokeless tobacco: Never  Vaping Use   Vaping status: Never Used  Substance and Sexual Activity   Alcohol use: No   Drug use: No   Sexual activity: Never  Other Topics Concern   Not on file  Social History Narrative   Not on file   Social Determinants of Health   Financial Resource Strain: Not on file  Food Insecurity: Not on file  Transportation Needs: Not on file  Physical Activity: Not on file  Stress: Not on file  Social Connections: Not on file  Intimate Partner Violence: Not on file    Family  History:   Unable to obtain family history at this time due to patient being somnolent and on BiPAP. Family History  Family history unknown: Yes     ROS:  Please see the history of present illness.  Unable to obtain full ROS at this time due to patient being somnolent and on BiPAP.   Physical Exam/Data:   Vitals:   09/09/22 1200 09/09/22 1208 09/09/22 1230 09/09/22 1300  BP: 97/72 111/70 103/71 109/71  Pulse: 96 89 87 85  Resp: 19 15 17  (!) 25  Temp:      TempSrc:      SpO2: 96% 98% 98% 98%  Weight:      Height:        Intake/Output Summary (Last 24 hours) at 09/09/2022 1350 Last data filed at 09/09/2022 0757 Gross per 24 hour  Intake 307.85 ml  Output 250 ml  Net 57.85 ml      09/09/2022    4:26 AM 09/08/2022    4:21 AM 09/07/2022    1:56 AM  Last 3 Weights  Weight (lbs) 289 lb 0.4 oz 300 lb 0.7 oz 300 lb 3.2 oz  Weight (kg) 131.1 kg 136.1 kg 136.17 kg     Body mass index is 49.61 kg/m.  General: 67 y.o. morbidly obese African-American female. Somnolent and on BiPAP. HEENT: Normocephalic and atraumatic.  Neck: Supple. Unable to assess JV due to body habitus. Heart: Irregularly irregular rhythm with normal rate. No murmurs, gallops, or rubs. Radial pulses 2+ and equal bilaterally. Lungs: On BiPAP.  Clear to ausculation anteriorly with no wheezes, rhonchi, or rales noted. Abdomen: Soft, obese, and non-tender to palpation.  MSK: Normal strength and tone for age. Extremities: 1+ hard pitting edema with hyperpigmentation and skin changes consistent chronic venous insufficiency.  Skin: Warm and dry. Neuro: Somnolent.. No focal deficits. Psych: Somnolent.   EKG:  The EKG was personally reviewed and demonstrates:   Initial EKG showed atrial fibrillation, rate 133 bpm, with non-specific ST/T changes. Repeat EKG showed rate controlled atrial fibrillation with no acute changes. Telemetry:  Telemetry was personally reviewed and demonstrates:  Atrial fibrillation with rates a  high as the 140s but currently in the 80s to 90s.  Relevant CV Studies:  Echocardiogram 09/07/2022:  Impressions:    1. Left ventricular ejection fraction, by estimation, is 50%. Left  ventricular ejection fraction by 3D volume is 49 %. The left ventricle has  mildly decreased function. The left ventricle demonstrates global  hypokinesis. There is  mild left ventricular  hypertrophy. Left ventricular diastolic parameters are indeterminate.  There is the interventricular septum is flattened in systole, consistent  with right ventricular pressure overload.   2. Right ventricular systolic function is mildly reduced. The right  ventricular size is mildly enlarged. Mildly increased right ventricular  wall thickness. There is mildly elevated pulmonary artery systolic  pressure. The estimated right ventricular  systolic pressure is 44.8 mmHg.   3. Left atrial size was mildly dilated.   4. Right atrial size was mildly dilated.   5. The mitral valve is grossly normal. Trivial mitral valve  regurgitation. No evidence of mitral stenosis. Moderate mitral annular  calcification.   6. There is one beat of systolic flow reversal of hepatic vein spectral  Doppler. Tricuspid valve regurgitation is moderate to severe.   7. Nodular calcification of leaflet tips. The aortic valve is tricuspid.  There is mild calcification of the aortic valve. Aortic valve  regurgitation is trivial. No aortic stenosis is present.   8. Aortic dilatation noted. There is mild dilatation of the ascending  aorta, measuring 42 mm.   9. The inferior vena cava is dilated in size with <50% respiratory  variability, suggesting right atrial pressure of 15 mmHg.   Laboratory Data:  High Sensitivity Troponin:  No results for input(s): "TROPONINIHS" in the last 720 hours.   Chemistry Recent Labs  Lab 09/06/22 1349 09/07/22 0431 09/08/22 0338 09/09/22 0646  NA  --  137 140 134*  K  --  3.8 4.2 4.0  CL  --  99 95* 94*  CO2   --  29 33* 30  GLUCOSE  --  167* 139* 160*  BUN  --  8 9 9   CREATININE  --  0.80 1.03* 0.95  CALCIUM  --  8.0* 8.6* 8.3*  MG 2.1  --   --   --   GFRNONAA  --  >60 60* >60  ANIONGAP  --  9 12 10     Recent Labs  Lab 09/06/22 0237  PROT 7.8  ALBUMIN 3.8  AST 16  ALT 12  ALKPHOS 68  BILITOT 0.4   Lipids No results for input(s): "CHOL", "TRIG", "HDL", "LABVLDL", "LDLCALC", "CHOLHDL" in the last 168 hours.  Hematology Recent Labs  Lab 09/07/22 0431 09/08/22 0338 09/09/22 1048  WBC 9.9 11.7* 11.9*  RBC 4.21 4.32 4.25  HGB 10.1* 10.7* 10.2*  HCT 36.2 37.8 36.3  MCV 86.0 87.5 85.4  MCH 24.0* 24.8* 24.0*  MCHC 27.9* 28.3* 28.1*  RDW 15.0 15.1 14.8  PLT 290 277 307   Thyroid  Recent Labs  Lab 09/06/22 1349  TSH 1.776    BNP Recent Labs  Lab 09/06/22 1349 09/09/22 1048  BNP 221.4* 293.6*    DDimer  Recent Labs  Lab 09/06/22 1349  DDIMER 0.58*     Radiology/Studies:  DG CHEST PORT 1 VIEW  Result Date: 09/09/2022 CLINICAL DATA:  CHF EXAM: PORTABLE CHEST 1 VIEW COMPARISON:  CXR 09/06/22 FINDINGS: Cardiomegaly. No pleural effusion. No pneumothorax. No focal airspace opacity. No radiographically apparent displaced rib fractures. Visualized upper abdomen unremarkable. Mild pulmonary venous congestion IMPRESSION: Cardiomegaly with mild pulmonary venous congestion. No evidence of pleural effusion or overt pulmonary edema. Electronically Signed   By: Lorenza Cambridge M.D.   On: 09/09/2022 13:34   CT CHEST W CONTRAST  Result Date: 09/08/2022 CLINICAL DATA:  Pulmonary nodules EXAM: CT CHEST WITH CONTRAST TECHNIQUE: Multidetector CT imaging of the chest was performed during intravenous contrast administration.  RADIATION DOSE REDUCTION: This exam was performed according to the departmental dose-optimization program which includes automated exposure control, adjustment of the mA and/or kV according to patient size and/or use of iterative reconstruction technique. CONTRAST:  75mL  OMNIPAQUE IOHEXOL 350 MG/ML SOLN COMPARISON:  Chest radiograph dated 09/06/2018. CT chest dated 02/01/2012. FINDINGS: Cardiovascular: The heart is top-normal in size. No pericardial effusion. No evidence of thoracic aneurysm. Atherosclerotic calcifications of the aortic arch. Moderate coronary atherosclerosis of the LAD and left coronary artery. Mediastinum/Nodes: Small mediastinal nodes, including a 17 mm short axis low right paratracheal node (series 3/image 84) and 14 mm short axis right hilar/perihilar nodes. Visualized thyroid is unremarkable. Lungs/Pleura: Evaluation of the lung parenchyma is constrained by respiratory motion. Within that constraint, there are no suspicious pulmonary nodules. Specifically, the right basilar nodular opacity noted on recent prior CT is not evident on the current study. Mild patchy right lower lobe opacity, favoring atelectasis, with suspected trace right pleural effusion. New 10 mm opacity at the medial left lung base likely reflects additional atelectasis rather than a true nodule given rapid interval change. No pneumothorax. Upper Abdomen: Visualized upper abdomen is grossly unremarkable, noting vascular calcifications. Musculoskeletal: Degenerative changes of the visualized thoracolumbar spine. Destructive endplate changes at T8-9 with suspected paravertebral soft tissue (series 3/image 83), raising the possibility of discitis osteomyelitis. IMPRESSION: Destructive endplate changes at T8-9 with suspected paravertebral soft tissue, raising the possibility of discitis osteomyelitis. MRI of the thoracic spine with and without contrast is suggested for further evaluation. No suspicious pulmonary nodules. Specifically, the right basilar nodular opacity noted on recent prior CT is not evident on the current study. Mild patchy right lower lobe opacity, favoring atelectasis, with suspected trace right pleural effusion. Small mediastinal and right hilar/perihilar nodes, possibly  reactive. Follow-up CT chest is suggested in 3 months. Aortic Atherosclerosis (ICD10-I70.0). Electronically Signed   By: Charline Bills M.D.   On: 09/08/2022 16:48   ECHOCARDIOGRAM COMPLETE  Result Date: 09/07/2022    ECHOCARDIOGRAM REPORT   Patient Name:   Karle Starch Date of Exam: 09/07/2022 Medical Rec #:  [id number]           Height:       64.0 in Accession #:    [id number]          Weight:       300.2 lb Date of Birth:  24-Nov-1955          BSA:          2.325 m Patient Age:    66 years            BP:           111/78 mmHg Patient Gender: F                   HR:           101 bpm. Exam Location:  Inpatient Procedure: 2D Echo, 3D Echo, Cardiac Doppler and Color Doppler Indications:    I48.91* Unspeicified atrial fibrillation  History:        Patient has prior history of Echocardiogram examinations, most                 recent 02/02/2012. Abnormal ECG, COPD, Arrythmias:Atrial                 Fibrillation; Risk Factors:Diabetes and Hypertension.  Sonographer:    Sheralyn Boatman RDCS Referring Phys: Clydie Braun  Sonographer Comments: Patient is obese. Image acquisition challenging due  to patient body habitus. Study delayed 20 minutes to get patient back in bed IMPRESSIONS  1. Left ventricular ejection fraction, by estimation, is 50%. Left ventricular ejection fraction by 3D volume is 49 %. The left ventricle has mildly decreased function. The left ventricle demonstrates global hypokinesis. There is mild left ventricular hypertrophy. Left ventricular diastolic parameters are indeterminate. There is the interventricular septum is flattened in systole, consistent with right ventricular pressure overload.  2. Right ventricular systolic function is mildly reduced. The right ventricular size is mildly enlarged. Mildly increased right ventricular wall thickness. There is mildly elevated pulmonary artery systolic pressure. The estimated right ventricular systolic pressure is 44.8 mmHg.  3. Left atrial size was  mildly dilated.  4. Right atrial size was mildly dilated.  5. The mitral valve is grossly normal. Trivial mitral valve regurgitation. No evidence of mitral stenosis. Moderate mitral annular calcification.  6. There is one beat of systolic flow reversal of hepatic vein spectral Doppler. Tricuspid valve regurgitation is moderate to severe.  7. Nodular calcification of leaflet tips. The aortic valve is tricuspid. There is mild calcification of the aortic valve. Aortic valve regurgitation is trivial. No aortic stenosis is present.  8. Aortic dilatation noted. There is mild dilatation of the ascending aorta, measuring 42 mm.  9. The inferior vena cava is dilated in size with <50% respiratory variability, suggesting right atrial pressure of 15 mmHg. Comparison(s): Unable to view prior study. FINDINGS  Left Ventricle: Left ventricular ejection fraction, by estimation, is 50%. Left ventricular ejection fraction by 3D volume is 49 %. The left ventricle has mildly decreased function. The left ventricle demonstrates global hypokinesis. The left ventricular internal cavity size was normal in size. There is mild left ventricular hypertrophy. The interventricular septum is flattened in systole, consistent with right ventricular pressure overload. Left ventricular diastolic parameters are indeterminate. Right Ventricle: The right ventricular size is mildly enlarged. Mildly increased right ventricular wall thickness. Right ventricular systolic function is mildly reduced. There is mildly elevated pulmonary artery systolic pressure. The tricuspid regurgitant velocity is 2.73 m/s, and with an assumed right atrial pressure of 15 mmHg, the estimated right ventricular systolic pressure is 44.8 mmHg. Left Atrium: Left atrial size was mildly dilated. Right Atrium: Right atrial size was mildly dilated. Pericardium: There is no evidence of pericardial effusion. Mitral Valve: The mitral valve is grossly normal. Moderate mitral annular  calcification. Trivial mitral valve regurgitation. No evidence of mitral valve stenosis. Tricuspid Valve: There is one beat of systolic flow reversal of hepatic vein spectral Doppler. The tricuspid valve is grossly normal. Tricuspid valve regurgitation is moderate to severe. No evidence of tricuspid stenosis. Aortic Valve: Nodular calcification of leaflet tips. The aortic valve is tricuspid. There is mild calcification of the aortic valve. Aortic valve regurgitation is trivial. No aortic stenosis is present. Pulmonic Valve: The pulmonic valve was normal in structure. Pulmonic valve regurgitation is not visualized. No evidence of pulmonic stenosis. Aorta: Aortic dilatation noted. There is mild dilatation of the ascending aorta, measuring 42 mm. Venous: The inferior vena cava is dilated in size with less than 50% respiratory variability, suggesting right atrial pressure of 15 mmHg. IAS/Shunts: No atrial level shunt detected by color flow Doppler.  LEFT VENTRICLE PLAX 2D LVIDd:         4.60 cm LVIDs:         3.50 cm LV PW:         1.60 cm         3D Volume EF  LV IVS:        1.20 cm         LV 3D EF:    Left                                             ventricul                                             ar LV Volumes (MOD)                            ejection LV vol d, MOD    69.4 ml                    fraction A2C:                                        by 3D LV vol d, MOD    64.8 ml                    volume is A4C:                                        49 %. LV vol s, MOD    28.0 ml A2C: LV vol s, MOD    29.9 ml       3D Volume EF: A4C:                           3D EF:        49 % LV SV MOD A2C:   41.4 ml       LV EDV:       170 ml LV SV MOD A4C:   64.8 ml       LV ESV:       87 ml LV SV MOD BP:    39.9 ml       LV SV:        83 ml RIGHT VENTRICLE             IVC RV S prime:     13.30 cm/s  IVC diam: 2.70 cm RVOT diam:      2.90 cm TAPSE (M-mode): 1.9 cm LEFT ATRIUM             Index        RIGHT ATRIUM            Index LA diam:        4.90 cm 2.11 cm/m   RA Area:     24.80 cm LA Vol (A2C):   64.5 ml 27.74 ml/m  RA Volume:   78.20 ml  33.64 ml/m LA Vol (A4C):   68.1 ml 29.29 ml/m LA Biplane Vol: 67.9 ml 29.20 ml/m  AORTIC VALVE LVOT Vmax:   136.50 cm/s LVOT Vmean:  93.500 cm/s LVOT VTI:    0.210 m  AORTA Ao Root diam: 3.10 cm Ao Asc diam:  4.15 cm  MITRAL VALVE                TRICUSPID VALVE MV Area (PHT): 3.99 cm     TR Peak grad:   29.8 mmHg MV Decel Time: 190 msec     TR Vmax:        273.00 cm/s MV E velocity: 134.00 cm/s                             SHUNTS                             Systemic VTI:  0.21 m                             Pulmonic Diam: 2.90 cm Riley Lam MD Electronically signed by Riley Lam MD Signature Date/Time: 09/07/2022/1:29:37 PM    Final    CT ABDOMEN PELVIS W CONTRAST  Result Date: 09/06/2022 CLINICAL DATA:  Abdominal and flank pain. EXAM: CT ABDOMEN AND PELVIS WITH CONTRAST TECHNIQUE: Multidetector CT imaging of the abdomen and pelvis was performed using the standard protocol following bolus administration of intravenous contrast. RADIATION DOSE REDUCTION: This exam was performed according to the departmental dose-optimization program which includes automated exposure control, adjustment of the mA and/or kV according to patient size and/or use of iterative reconstruction technique. CONTRAST:  OMNIPAQUE IOHEXOL 300 MG/ML  SOLN COMPARISON:  Limited comparison is available with CTA chest 02/01/2012. Portable chest has also been performed today. FINDINGS: Lower chest: The heart is moderately enlarged. There is no pericardial effusion. Small hiatal hernia. There are three-vessel coronary calcifications greatest in the LAD. Scattered calcifications in the aortic valve leaflets. There are posterior atelectatic changes. Newly noted in the right lower lobe on 4:13 adjacent the posterior crest of the hemidiaphragm, there is a 1.6 cm nodular opacity. Also, at base of the right  upper lobe on 4:3 there is a noncalcified 6 mm nodule not seen previously. Prompt chest CT is recommended to assess for additional nodules. Remaining lung bases are clear. Hepatobiliary: Respiratory motion limits fine detail. No liver mass is seen through the breathing motion. The liver is 21 cm length and mildly steatotic. The gallbladder and bile ducts are unremarkable. Pancreas: No abnormality is seen through breathing motion. Spleen: No abnormality is seen through the breathing motion. No splenomegaly. Adrenals/Urinary Tract: Also with limited fine detail in the kidneys due to breathing motion. No focal abnormality is suspected in the adrenal glands and kidneys. There is a 3 mm caliceal stone in the inferior pole of the right kidney. No left nephrolithiasis or other right renal stones are seen. There is no hydronephrosis or ureteral stone. The bladder is unremarkable for the degree of distention. Stomach/Bowel: Unremarkable stomach, unremarkable unopacified small bowel. An appendix is not seen in this patient. There is moderate retained stool ascending and transverse colon. Left-sided diverticulosis but no evidence of acute diverticulitis. Vascular/Lymphatic: Aortic atherosclerosis. No enlarged abdominal or pelvic lymph nodes. There are however, multiple bilateral enlarged inguinal chain nodes, largest on the right is 2.3 cm in short axis and the largest on the left is 2 cm in short axis. Etiology indeterminate. Reproductive: The uterus is intact. There is a 1.9 cm calcified subserosal fibroid to the right along the fundus. No adnexal mass is seen. Multiple pelvic phleboliths. Other: Small umbilical and inguinal fat hernias. Mild  body wall edema. Stranding in the lower abdominal wall is seen and could be part of the bilateral body wall edema or could be due to cellulitis. There is no free fluid, free hemorrhage or free air, or incarcerated hernia. Musculoskeletal: Advanced L4-5 facet hypertrophy, acquired spinal  canal stenosis and grade 1 L4-5 spondylolisthesis are noted present. Degenerative changes in visualized lower thoracic spine. No acute or other significant osseous findings. IMPRESSION: 1. 1.6 cm nodular opacity in the right lower lobe and 6 mm nodule in the right upper lobe. Prompt chest CT is recommended to assess for additional nodules. 2. Cardiomegaly with calcific CAD and aortic atherosclerosis. 3. Small hiatal hernia. 4. Constipation and diverticulosis. 5. Nonobstructive right-sided nephrolithiasis. No obstructing stones. 6. Bilateral inguinal chain adenopathy of uncertain etiology. 7. Lower abdominal wall stranding which could be part of the bilateral body wall edema or could be due to cellulitis. 8. Small umbilical and inguinal fat hernias. 9. Advanced L4-5 facet hypertrophy, acquired spinal canal stenosis and grade 1 L4-5 spondylolisthesis. 10. Mild hepatic steatosis. Aortic Atherosclerosis (ICD10-I70.0). Electronically Signed   By: Almira Bar M.D.   On: 09/06/2022 05:16   CT L-SPINE NO CHARGE  Result Date: 09/06/2022 CLINICAL DATA:  Abdominal/flank pain with stone suspected EXAM: CT Lumbar Spine with contrast TECHNIQUE: Technique: Multiplanar CT images of the lumbar spine were reconstructed from contemporary CT of the Abdomen and Pelvis. RADIATION DOSE REDUCTION: This exam was performed according to the departmental dose-optimization program which includes automated exposure control, adjustment of the mA and/or kV according to patient size and/or use of iterative reconstruction technique. CONTRAST:  None additional COMPARISON:  None Available. FINDINGS: Segmentation: 5 lumbar type vertebrae. Alignment: Mild degenerative anterolisthesis at L4-5 and retrolisthesis at L5-S1. Vertebrae: No acute fracture or focal pathologic process. Paraspinal and other soft tissues: Reported separately Disc levels: T12- L1: Facet and costovertebral spurring eccentric to the left with mild foraminal encroachment.  L1-L2: Mild disc bulging and facet spurring L2-L3: Mild disc bulging and facet spurring L3-L4: Moderate degenerative facet hypertrophy. Circumferential disc bulging. Moderate spinal and biforaminal stenosis L4-L5: Advanced facet osteoarthritis with spurring and anterolisthesis. The disc is narrowed and bulging. Advanced appearing spinal and biforaminal stenosis L5-S1:Moderate degenerative facet spurring asymmetric to the left. Mild disc bulging and mild left foraminal narrowing. IMPRESSION: 1. No acute finding. 2. Generalized lumbar spine degeneration especially affecting lower lumbar facets with L4-5 and L5-S1 anterolisthesis. 3. L4-5 high-grade spinal stenosis. 4. Foraminal impingement suspected at both L3-4 and L4-5. Electronically Signed   By: Tiburcio Pea M.D.   On: 09/06/2022 04:56   DG Chest Portable 1 View  Result Date: 09/06/2022 CLINICAL DATA:  Shortness of breath EXAM: PORTABLE CHEST 1 VIEW COMPARISON:  04/06/2013 FINDINGS: Cardiac shadow is mildly enlarged. Lungs are clear bilaterally. No bony abnormality is noted. IMPRESSION: No active disease. Electronically Signed   By: Alcide Clever M.D.   On: 09/06/2022 03:13     Assessment and Plan:   Atrial Fibrillation with RVR Patient presented with severe back pain and was noted to be in rapid atrial fibrillation on admission with rates as high as the 130s to 140s as well as respiratory distress. Unclear chronicity. Patient has Eliquis listed under PTA medications but patient states this was for "fluid" and denies any history of atrial fibrillation. Echo shows normal LV function. She was started on IV Cardizem with improvement in rates.  - Rates currently in the 80s to 90s. - Currently on IV Cardizem. Would continue this until her respiratory  status has improved some and then we can try to switch her to PO. - Continue Toprol-XL 100mg  daily. - Continue Eliquis 5mg  twice daily. - When patient becomes more alert, we can ask where she was seen in  [place] and request records. Unclear if she has missed any doses of Eliquis. Given she is asymptomatic with this, would not recommend TEE/ DCCV this admission unless we cannot control her heart rates. Can consider outpatient DCCV. If she ends up needing to have back surgery, it may be better to wait to do this until after her surgery.  Possible Acute on Chronic Diastolic CHF BNP 409 >> 293. Chest x-ray showed no acute findings. Chest CT showed mild patchy right lowe lobe opacity favoring atelectasis with suspected trace right pleural effusion. Echo showed LVEF of 50% with global hypokinesis, mildly enlarged RV with mildly reduced RV function and a flattened interventricular septum in systole consistent with RV pressure overload, mildly elevated PASP of 44.8 mmHg, moderate MAC with only trivial MR, moderate to severe TR, and mild dilatation of the ascending aorta measuring 42 mm. He was given 1 dose of IV Lasix 40mg  in the ED and had good urinary output with this. - Difficult to assess volume status given body habitus. - Primary team has restarted IV Lasix 40mg  twice daily to see if this helps with her respiratory status and I agree.  Acute Respiratory Failure with Hypercapnia  Venous blood gas on admission showed pH of 7.234, pCO2 80.2, pO2 29, and Bicarb 34.0 and she was placed on BiPAP. She was placed back on BiPAP today given somnolence and ABG showing pCO2 of 72. Hypercapnia felt to likely be due to morbid obesity with untreated obstructive sleep apnea and obesity hypoventilation syndrome. - Currently on BiPAP. - Management per primary team.  Coronary Artery Calcifications Aortic Atherosclerosis Noted on CT this admission. - Patient denies any chest pain. - No aspirin due to need for Eliquis. - Continue statin.  Hypertension BP soft but stable. - Continue IV Cardizem for now. - Continue Toprol-XL 100mg  daily.  Morbid Obesity Obstructive Sleep Apnea BMI 49. Per Internal Medicine note, patient  did report a history of sleep apnea but does not use a CPAP/ BiPAP machine. - Recommend weight loss and outpatient sleep study.  Otherwise, per primary team: - Acute on chronic back pain - Fever - Type 2 diabetes mellitus - Lung nodules - Brain aneurysm     Risk Assessment/Risk Scores:   New York Heart Association (NYHA) Functional Class Unable to assess this due to patient's somnolence and need for BiPAP.  CHA2DS2-VASc Score = 5  his indicates a 7.2% annual risk of stroke. The patient's score is based upon: CHF History: 0 HTN History: 1 Diabetes History: 1 Stroke History: 0 Vascular Disease History: 1 Age Score: 1 Gender Score: 1   For questions or updates, please contact [facility] Please consult [email or web address] for contact info under    Signed, Corrin Parker, PA-C  09/09/2022 1:50 PM  Pt seen and examined   I agree with findings as noted above by Thane Edu The pt is a 67 yo with hx of PAF (on Eliquis), HTN, CAD (on CT scan), T2 DM , OSA  (not on CPAP) morbid obesity Pt presented for eval of acute on chronic back pain   Found to be hypoxic and in afib wit hRVR     Echo on 09/07/22:  LVEF 50%  RVEF mildly reduced   Pt currently resting comfortably on  BiPAP  Denies CP    Neck  Full  Unable to assess JVP   Lungs are CTA  Cardiac Irreg irreg  No S3  No significant murmurs  Abd is supple  Ext are 1+ edema   Chronic skin changes      Impression:  Afib  Currently rates improved on IV cardiazem   Keep on current regimen for now  Of IV dilt and toprol Continue Eliquis     Will continue to follow  Dietrich Pates MD

## 2022-09-10 ENCOUNTER — Inpatient Hospital Stay (HOSPITAL_COMMUNITY): Payer: Medicare HMO

## 2022-09-10 DIAGNOSIS — J9602 Acute respiratory failure with hypercapnia: Secondary | ICD-10-CM | POA: Diagnosis not present

## 2022-09-10 DIAGNOSIS — F17210 Nicotine dependence, cigarettes, uncomplicated: Secondary | ICD-10-CM | POA: Diagnosis not present

## 2022-09-10 DIAGNOSIS — I4891 Unspecified atrial fibrillation: Secondary | ICD-10-CM | POA: Diagnosis not present

## 2022-09-10 LAB — GLUCOSE, CAPILLARY
Glucose-Capillary: 218 mg/dL — ABNORMAL HIGH (ref 70–99)
Glucose-Capillary: 242 mg/dL — ABNORMAL HIGH (ref 70–99)
Glucose-Capillary: 117 mg/dL — ABNORMAL HIGH (ref 70–99)
Glucose-Capillary: 169 mg/dL — ABNORMAL HIGH (ref 70–99)

## 2022-09-10 LAB — COMPREHENSIVE METABOLIC PANEL
ALT: 14 U/L (ref 0–44)
AST: 19 U/L (ref 15–41)
Albumin: 2.6 g/dL — ABNORMAL LOW (ref 3.5–5.0)
Alkaline Phosphatase: 53 U/L (ref 38–126)
Anion gap: 8 (ref 5–15)
BUN: 8 mg/dL (ref 8–23)
CO2: 35 mmol/L — ABNORMAL HIGH (ref 22–32)
Calcium: 8.6 mg/dL — ABNORMAL LOW (ref 8.9–10.3)
Chloride: 92 mmol/L — ABNORMAL LOW (ref 98–111)
Creatinine, Ser: 0.84 mg/dL (ref 0.44–1.00)
GFR, Estimated: >60 mL/min (ref >60)
Glucose, Bld: 144 mg/dL — ABNORMAL HIGH (ref 70–99)
Potassium: 3.9 mmol/L (ref 3.5–5.1)
Sodium: 135 mmol/L (ref 135–145)
Total Bilirubin: 0.7 mg/dL (ref 0.3–1.2)
Total Protein: 7.5 g/dL (ref 6.5–8.1)

## 2022-09-10 LAB — CBC
HCT: 35 % — ABNORMAL LOW (ref 36.0–46.0)
Hemoglobin: 10 g/dL — ABNORMAL LOW (ref 12.0–15.0)
MCH: 24.2 pg — ABNORMAL LOW (ref 26.0–34.0)
MCHC: 28.6 g/dL — ABNORMAL LOW (ref 30.0–36.0)
MCV: 84.7 fL (ref 80.0–100.0)
Platelets: 301 10*3/uL (ref 150–400)
RBC: 4.13 MIL/uL (ref 3.87–5.11)
RDW: 14.6 % (ref 11.5–15.5)
WBC: 9.8 10*3/uL (ref 4.0–10.5)
nRBC: 0 % (ref 0.0–0.2)

## 2022-09-10 LAB — SEDIMENTATION RATE: Sed Rate: 57 mm/hr — ABNORMAL HIGH (ref 0–22)

## 2022-09-10 LAB — SARS CORONAVIRUS 2 BY RT PCR: SARS Coronavirus 2 by RT PCR: NEGATIVE

## 2022-09-10 LAB — C-REACTIVE PROTEIN: CRP: 6.5 mg/dL — ABNORMAL HIGH (ref <1.0)

## 2022-09-10 MED ORDER — AMIODARONE LOAD VIA INFUSION
150.0000 mg | Freq: Once | INTRAVENOUS | Status: AC
  Administered 2022-09-10: 150 mg via INTRAVENOUS
  Filled 2022-09-10: qty 83.34

## 2022-09-10 MED ORDER — AMIODARONE HCL IN DEXTROSE 360-4.14 MG/200ML-% IV SOLN
60.0000 mg/h | INTRAVENOUS | Status: AC
  Administered 2022-09-10 (×2): 60 mg/h via INTRAVENOUS

## 2022-09-10 MED ORDER — AMIODARONE HCL IN DEXTROSE 360-4.14 MG/200ML-% IV SOLN
30.0000 mg/h | INTRAVENOUS | Status: DC
  Administered 2022-09-10 – 2022-09-11 (×3): 30 mg/h via INTRAVENOUS
  Filled 2022-09-10 (×5): qty 200

## 2022-09-10 MED ORDER — POLYETHYLENE GLYCOL 3350 17 G PO PACK
17.0000 g | PACK | Freq: Every day | ORAL | Status: DC
  Administered 2022-09-10 – 2022-09-22 (×9): 17 g via ORAL
  Filled 2022-09-10 (×13): qty 1

## 2022-09-10 NOTE — Plan of Care (Signed)

## 2022-09-10 NOTE — Progress Notes (Signed)
Erroneous entry

## 2022-09-10 NOTE — Progress Notes (Signed)
Patient BP 86/59, 81/62 with cardizem running at 5mg , cardizem stopped and MD contacted and made aware.

## 2022-09-10 NOTE — Progress Notes (Signed)
Physical Therapy Treatment Patient Details Name: Linda Chase MRN: [id number] DOB: 03-Mar-1955 Today's Date: 09/10/2022   History of Present Illness Linda Chase is a 67 y.o. female who presented to [facility] ED with complaints of lower back pain however due to respiratory difficulty placed on biPap and then transferred to [facility]. HR also in 130s in a-fib. PMH: HTN, paroxysmal a-fib on chronic anticoagulation, DM II, cerebral aneurysm, and morbid obesity    PT Comments  Patient progressing slowly.  Less O2 requirement today though with inconsistent readings of SpO2 with hand with probe gripping walker.  Seemed to remain in 90's when lightened her grip though HR readings into 140's.  She needed +2 today to stand from recliner, but reports feels due to stiffness in bed.  Also significant pain during sit to supine (RN gave pain meds) though reports sleeps in her recliner at home.  Remains appropriate for SNF level rehab at d/c.  PT will continue to follow.     If plan is discharge home, recommend the following: A lot of help with walking and/or transfers;Help with stairs or ramp for entrance;Assist for transportation;Assistance with cooking/housework;A lot of help with bathing/dressing/bathroom   Can travel by private vehicle     No  Equipment Recommendations  Rolling walker (2 wheels)    Recommendations for Other Services       Precautions / Restrictions Precautions Precautions: Fall Precaution Comments: watch SpO2 and HR Restrictions Weight Bearing Restrictions: No     Mobility  Bed Mobility Overal bed mobility: Needs Assistance Bed Mobility: Sit to Supine       Sit to supine: HOB elevated, Max assist   General bed mobility comments: assist for legs into bed and to move shoulders to middle, position back for comfort due to yelling in pain    Transfers Overall transfer level: Needs assistance Equipment used: Rolling walker (2 wheels) Transfers: Sit to/from  Stand, Bed to chair/wheelchair/BSC Sit to Stand: Mod assist, +2 physical assistance   Step pivot transfers: Min assist       General transfer comment: attempted wtih +1 up from recliner but pt not able so NT in to assist and used pad under pt's hips to help lift to standing.  after ambulation up from EOB with +1 mod A and to step to [facility] with min A and cues for hand placement, assist for balance    Ambulation/Gait Ambulation/Gait assistance: Min assist, +2 safety/equipment Gait Distance (Feet): 90 Feet Assistive device: Rolling walker (2 wheels) Gait Pattern/deviations: Step-to pattern, Decreased stride length, Wide base of support, Trunk flexed, Antalgic, Step-through pattern, Shuffle       General Gait Details: decreased foot clearance bilaterally, assist for balance, wide BOS and cues for proximity to RW, NT assisted wtih IV and lines;  pt stopping x 3 to straighted up/stretch back while walking, see comment section for vitals   Stairs             Wheelchair Mobility     Tilt Bed    Modified Rankin (Stroke Patients Only)       Balance Overall balance assessment: Needs assistance   Sitting balance-Leahy Scale: Good Sitting balance - Comments: leaning and reaching while on very edge of BSC to wipe after toileting   Standing balance support: Bilateral upper extremity supported Standing balance-Leahy Scale: Poor Standing balance comment: UE support for balance  Cognition Arousal: Alert Behavior During Therapy: Anxious Overall Cognitive Status: No family/caregiver present to determine baseline cognitive functioning Area of Impairment: Following commands, Safety/judgement, Awareness, Problem solving                       Following Commands: Follows one step commands consistently, Follows multi-step commands with increased time Safety/Judgement: Decreased awareness of safety   Problem Solving: Slow processing,  Difficulty sequencing, Requires verbal cues, Requires tactile cues General Comments: cues needed throughout for safety        Exercises      General Comments General comments (skin integrity, edema, etc.): on 2L O2 via [place], SpO2 reading low with ambulation though improves when pt lightens grip so likely poor reading 91% when lighter girp 82% when gripping walker, HR reading into 140's with ambulation but quickly down to 110's      Pertinent Vitals/Pain Pain Assessment Pain Assessment: Faces Faces Pain Scale: Hurts even more Pain Location: low back with movement Pain Descriptors / Indicators: Discomfort, Grimacing, Stabbing Pain Intervention(s): Monitored during session, Repositioned, RN gave pain meds during session    Home Living                          Prior Function            PT Goals (current goals can now be found in the care plan section) Progress towards PT goals: Progressing toward goals    Frequency    Min 1X/week      PT Plan      Co-evaluation              AM-PAC PT "6 Clicks" Mobility   Outcome Measure  Help needed turning from your back to your side while in a flat bed without using bedrails?: A Lot Help needed moving from lying on your back to sitting on the side of a flat bed without using bedrails?: A Lot Help needed moving to and from a bed to a chair (including a wheelchair)?: A Lot Help needed standing up from a chair using your arms (e.g., wheelchair or bedside chair)?: A Lot Help needed to walk in hospital room?: A Lot Help needed climbing 3-5 steps with a railing? : Total 6 Click Score: 11    End of Session Equipment Utilized During Treatment: Gait belt;Oxygen Activity Tolerance: Patient limited by fatigue;Patient limited by pain Patient left: in bed;with call bell/phone within reach;with nursing/sitter in room   PT Visit Diagnosis: Unsteadiness on feet (R26.81);Other abnormalities of gait and mobility  (R26.89);Pain Pain - part of body:  (back pain)     Time: 6213-0865 PT Time Calculation (min) (ACUTE ONLY): 31 min  Charges:    $Gait Training: 8-22 mins $Therapeutic Activity: 8-22 mins PT General Charges $$ ACUTE PT VISIT: 1 Visit                     Sheran Lawless, PT Acute Rehabilitation Services Office:[phone] 09/10/2022    Elray Mcgregor 09/10/2022, 1:13 PM

## 2022-09-10 NOTE — Plan of Care (Signed)
  Problem: Education: Goal: Knowledge of General Education information will improve Description: Including pain rating scale, medication(s)/side effects and non-pharmacologic comfort measures Outcome: Progressing   Problem: Health Behavior/Discharge Planning: Goal: Ability to manage health-related needs will improve Outcome: Not Progressing   Problem: Clinical Measurements: Goal: Ability to maintain clinical measurements within normal limits will improve Outcome: Not Progressing Goal: Will remain free from infection Outcome: Progressing Goal: Diagnostic test results will improve Outcome: Progressing Goal: Respiratory complications will improve Outcome: Progressing Goal: Cardiovascular complication will be avoided Outcome: Progressing   Problem: Activity: Goal: Risk for activity intolerance will decrease Outcome: Not Progressing   Problem: Nutrition: Goal: Adequate nutrition will be maintained Outcome: Progressing   Problem: Coping: Goal: Level of anxiety will decrease Outcome: Progressing   Problem: Elimination: Goal: Will not experience complications related to bowel motility Outcome: Not Progressing Goal: Will not experience complications related to urinary retention Outcome: Progressing   Problem: Pain Managment: Goal: General experience of comfort will improve Outcome: Progressing   Problem: Safety: Goal: Ability to remain free from injury will improve Outcome: Progressing   Problem: Skin Integrity: Goal: Risk for impaired skin integrity will decrease Outcome: Progressing   Problem: Education: Goal: Ability to describe self-care measures that may prevent or decrease complications (Diabetes Survival Skills Education) will improve Outcome: Progressing Goal: Individualized Educational Video(s) Outcome: Progressing   Problem: Coping: Goal: Ability to adjust to condition or change in health will improve Outcome: Not Progressing   Problem: Fluid  Volume: Goal: Ability to maintain a balanced intake and output will improve Outcome: Not Progressing   Problem: Health Behavior/Discharge Planning: Goal: Ability to identify and utilize available resources and services will improve Outcome: Progressing Goal: Ability to manage health-related needs will improve Outcome: Not Progressing   Problem: Metabolic: Goal: Ability to maintain appropriate glucose levels will improve Outcome: Progressing   Problem: Nutritional: Goal: Maintenance of adequate nutrition will improve Outcome: Progressing Goal: Progress toward achieving an optimal weight will improve Outcome: Not Progressing   Problem: Skin Integrity: Goal: Risk for impaired skin integrity will decrease Outcome: Progressing   Problem: Tissue Perfusion: Goal: Adequacy of tissue perfusion will improve Outcome: Progressing

## 2022-09-10 NOTE — TOC Progression Note (Signed)
Transition of Care (TOC) - Progression Note   SNF bed offers provided to patient with medicare ratings . Patient will review and discuss with son  Patient Details  Name: Linda Chase MRN: [id number] Date of Birth: [birth year]  Transition of Care [facility]) CM/SW Contact  Morrison Masser, Adria Devon, RN Phone Number: 09/10/2022, 11:32 AM  Clinical Narrative:       Expected Discharge Plan: Home w Home Health Services Barriers to Discharge: Continued Medical Work up  Expected Discharge Plan and Services   Discharge Planning Services: CM Consult Post Acute Care Choice: Home Health Living arrangements for the past 2 months: Apartment                 DME Arranged:  (see note)                     Social Determinants of Health (SDOH) Interventions SDOH Screenings   Food Insecurity: No Food Insecurity (09/09/2022)  Housing: Low Risk  (09/09/2022)  Transportation Needs: No Transportation Needs (09/09/2022)  Utilities: Not At Risk (09/09/2022)  Tobacco Use: High Risk (09/06/2022)    Readmission Risk Interventions     No data to display

## 2022-09-10 NOTE — Consult Note (Signed)
[facility]                                                                                        Patient Identification: Patient Name: Linda Chase MRN: [id number] Admit Date: 09/06/2022  1:33 AM Today's Date: 09/10/2022 Reason for consult: thoracic discitis and OM  Requesting provider: Dr Maryfrances Bunnell   Principal Problem:   Acute respiratory failure with hypercapnia (HCC) Active Problems:   Diabetes mellitus (HCC)   Hypertension   Lower back pain   Leukocytosis   Obesity, Class III, BMI 40-49.9 (morbid obesity) (HCC)   Abdominal wall swelling   Paroxysmal atrial fibrillation with RVR (HCC)   Chronic anticoagulation   Abnormal CT of the abdomen   Antibiotics:  None   Lines/Hardware:  Assessment 67 year old female with prior history of HTN, DM, cerebral aneurysm, PAF on AC, depression and morbid obesity who presented to ED on 8/26 with back pain and spasms. ID engaged for   # CT with Destructive endplate changes at T8-9 with suspected paravertebral soft tissue, raising the possibility of discitis osteomyelitis 8/29 blood cx NGTD CRP 8/26 4.3 unimpressive for discitis and osteomyelitis   Recommendations  Will get MRI T spine wwo contrast followed by IR evaluation for biopsy to send for cultures and path if MRI consistent with discitis/osteomyelitis  Fu blood cultures Would hold off on abtx currently pending MRI and IR plans. She has no new neurological changes nor in sepsis Will get ESR and CRP  Dr Drue Second covering until Monday.  I am back Tuesday  Rest of the management as per the primary team. Please call with questions or concerns.  Thank you for the consult  __________________________________________________________________________________________________________ HPI and Hospital Course(history mostly obtained from chart review as patient is a poor historian) 67 year old  female with prior history of HTN, DM, cerebral aneurysm, PAF on AC, depression and morbid obesity who presented to ED on 8/26 with back pain and spasms. Pain started last month when she was out of town, went to [place] via train and came back early this month. Was taking muscle relaxers and analgesics without any benefit.  Denied new trauma, falls. Denies fevers, chills. Denies cough, chest pain, GI or GU symptoms. Denies numbness, weakness. Denies smoking, alcohol and IVDU. Denies prior back surgeries or streoid injections. Denies incontinence except urine incontinence when in extreme pain. Seen in urgent care back in June for back spasms   At ED , afebrile, tachycardic, SOB requiring NIV Labs remarkable for WBC 11, procalcitonin < 0.1 8/29 blood cx NGTD  Imaging as below   ROS: limited as patient sleeps right back after waking up for a bit, has morbid obesity and possible OSA   Past Medical History:  Diagnosis Date   Arthritis    Cerebral aneurysm    x2   Depression    Diabetes mellitus    Hypertension    Past Surgical History:  Procedure Laterality Date   BRAIN SURGERY     double aneurysm   Scheduled Meds:  apixaban  5 mg Oral BID   atorvastatin  20 mg Oral Daily   cholecalciferol  2,000 Units Oral Daily   furosemide  40 mg Intravenous BID   gabapentin  300 mg Oral QHS   insulin aspart  0-15 Units Subcutaneous TID WC   insulin aspart  0-5 Units Subcutaneous QHS   metoprolol succinate  100 mg Oral Daily   revefenacin  175 mcg Nebulization Daily   sodium chloride flush  3 mL Intravenous Q12H   Continuous Infusions:  diltiazem (CARDIZEM) infusion Stopped (09/09/22 2011)   PRN Meds:.acetaminophen **OR** acetaminophen, albuterol, ipratropium-albuterol, ondansetron **OR** ondansetron (ZOFRAN) IV, oxyCODONE  Allergies  Allergen Reactions   Hydrocodone Nausea And Vomiting   Social History   Socioeconomic History   Marital status: Legally Separated    Spouse name: Not on file    Number of children: Not on file   Years of education: Not on file   Highest education level: Not on file  Occupational History   Not on file  Tobacco Use   Smoking status: Some Days    Types: Cigarettes   Smokeless tobacco: Never  Vaping Use   Vaping status: Never Used  Substance and Sexual Activity   Alcohol use: No   Drug use: No   Sexual activity: Never  Other Topics Concern   Not on file  Social History Narrative   Not on file   Social Determinants of Health   Financial Resource Strain: Not on file  Food Insecurity: No Food Insecurity (09/09/2022)   Hunger Vital Sign    Worried About Running Out of Food in the Last Year: Never true    Ran Out of Food in the Last Year: Never true  Transportation Needs: No Transportation Needs (09/09/2022)   PRAPARE - Administrator, Civil Service (Medical): No    Lack of Transportation (Non-Medical): No  Physical Activity: Not on file  Stress: Not on file  Social Connections: Not on file  Intimate Partner Violence: Not At Risk (09/09/2022)   Humiliation, Afraid, Rape, and Kick questionnaire    Fear of Current or Ex-Partner: No    Emotionally Abused: No    Physically Abused: No    Sexually Abused: No   Family History  Family history unknown: Yes   Vitals BP (!) 122/104 (BP Location: Right Wrist)   Pulse (!) 124   Temp 98.6 F (37 C) (Oral)   Resp (!) 23   Ht 5\' 4"  (1.626 m)   Wt (!) 138.7 kg   SpO2 96%   BMI 52.49 kg/m    Physical Exam Constitutional:  Morbidly obese female sitting in the recliner and on [place]. She goes back to sleep right after giving one to two word answer     Comments:   Cardiovascular:     Rate and Rhythm: Normal rate and regular Irrhythm.     Heart sounds: Distant heart sounds   Pulmonary:     Effort: Pulmonary effort is normal on [place]    Comments: Normal lung sounds   Abdominal:     Palpations: Abdomen is soft.     Tenderness: non distended and non tender  Musculoskeletal:         General: No swelling or tenderness in peripheral joints   Skin:    Comments: No rashes   Neurological:     General: awake, alert and oriented, following commands   Pertinent Microbiology Results for orders placed or performed during the hospital encounter of 04/06/13  Rapid strep screen     Status: None   Collection Time: 04/06/13 12:25 PM  Specimen: Oral Mucosa/Gingiva; Throat  Result Value Ref Range Status   Streptococcus, Group A Screen (Direct) NEGATIVE NEGATIVE Final    Comment: (NOTE) A Rapid Antigen test may result negative if the antigen level in the sample is below the detection level of this test. The FDA has not cleared this test as a stand-alone test therefore the rapid antigen negative result has reflexed to a Group A Strep culture.  Culture, Group A Strep     Status: None   Collection Time: 04/06/13 12:25 PM   Specimen: Throat  Result Value Ref Range Status   Specimen Description THROAT  Final   Special Requests NONE  Final   Culture   Final    No Beta Hemolytic Streptococci Isolated Performed at [facility]   Report Status 04/08/2013 FINAL  Final   Pertinent Lab seen by me:    Latest Ref Rng & Units 09/09/2022   10:48 AM 09/08/2022    3:38 AM 09/07/2022    4:31 AM  CBC  WBC 4.0 - 10.5 K/uL 11.9  11.7  9.9   Hemoglobin 12.0 - 15.0 g/dL 28.4  13.2  44.0   Hematocrit 36.0 - 46.0 % 36.3  37.8  36.2   Platelets 150 - 400 K/uL 307  277  290       Latest Ref Rng & Units 09/09/2022    6:46 AM 09/08/2022    3:38 AM 09/07/2022    4:31 AM  CMP  Glucose 70 - 99 mg/dL 102  725  366   BUN 8 - 23 mg/dL 9  9  8    Creatinine 0.44 - 1.00 mg/dL 4.40  3.47  4.25   Sodium 135 - 145 mmol/L 134  140  137   Potassium 3.5 - 5.1 mmol/L 4.0  4.2  3.8   Chloride 98 - 111 mmol/L 94  95  99   CO2 22 - 32 mmol/L 30  33  29   Calcium 8.9 - 10.3 mg/dL 8.3  8.6  8.0      Pertinent Imagings/Other Imagings Plain films and CT images have been personally visualized and  interpreted; radiology reports have been reviewed. Decision making incorporated into the Impression / Recommendations.  DG CHEST PORT 1 VIEW  Result Date: 09/09/2022 CLINICAL DATA:  CHF EXAM: PORTABLE CHEST 1 VIEW COMPARISON:  CXR 09/06/22 FINDINGS: Cardiomegaly. No pleural effusion. No pneumothorax. No focal airspace opacity. No radiographically apparent displaced rib fractures. Visualized upper abdomen unremarkable. Mild pulmonary venous congestion IMPRESSION: Cardiomegaly with mild pulmonary venous congestion. No evidence of pleural effusion or overt pulmonary edema. Electronically Signed   By: Lorenza Cambridge M.D.   On: 09/09/2022 13:34   CT CHEST W CONTRAST  Result Date: 09/08/2022 CLINICAL DATA:  Pulmonary nodules EXAM: CT CHEST WITH CONTRAST TECHNIQUE: Multidetector CT imaging of the chest was performed during intravenous contrast administration. RADIATION DOSE REDUCTION: This exam was performed according to the departmental dose-optimization program which includes automated exposure control, adjustment of the mA and/or kV according to patient size and/or use of iterative reconstruction technique. CONTRAST:  75mL OMNIPAQUE IOHEXOL 350 MG/ML SOLN COMPARISON:  Chest radiograph dated 09/06/2018. CT chest dated 02/01/2012. FINDINGS: Cardiovascular: The heart is top-normal in size. No pericardial effusion. No evidence of thoracic aneurysm. Atherosclerotic calcifications of the aortic arch. Moderate coronary atherosclerosis of the LAD and left coronary artery. Mediastinum/Nodes: Small mediastinal nodes, including a 17 mm short axis low right paratracheal node (series 3/image 84) and 14 mm short axis right hilar/perihilar  nodes. Visualized thyroid is unremarkable. Lungs/Pleura: Evaluation of the lung parenchyma is constrained by respiratory motion. Within that constraint, there are no suspicious pulmonary nodules. Specifically, the right basilar nodular opacity noted on recent prior CT is not evident on the  current study. Mild patchy right lower lobe opacity, favoring atelectasis, with suspected trace right pleural effusion. New 10 mm opacity at the medial left lung base likely reflects additional atelectasis rather than a true nodule given rapid interval change. No pneumothorax. Upper Abdomen: Visualized upper abdomen is grossly unremarkable, noting vascular calcifications. Musculoskeletal: Degenerative changes of the visualized thoracolumbar spine. Destructive endplate changes at T8-9 with suspected paravertebral soft tissue (series 3/image 83), raising the possibility of discitis osteomyelitis. IMPRESSION: Destructive endplate changes at T8-9 with suspected paravertebral soft tissue, raising the possibility of discitis osteomyelitis. MRI of the thoracic spine with and without contrast is suggested for further evaluation. No suspicious pulmonary nodules. Specifically, the right basilar nodular opacity noted on recent prior CT is not evident on the current study. Mild patchy right lower lobe opacity, favoring atelectasis, with suspected trace right pleural effusion. Small mediastinal and right hilar/perihilar nodes, possibly reactive. Follow-up CT chest is suggested in 3 months. Aortic Atherosclerosis (ICD10-I70.0). Electronically Signed   By: Charline Bills M.D.   On: 09/08/2022 16:48   ECHOCARDIOGRAM COMPLETE  Result Date: 09/07/2022    ECHOCARDIOGRAM REPORT   Patient Name:   Karle Starch Date of Exam: 09/07/2022 Medical Rec #:  [id number]           Height:       64.0 in Accession #:    [id number]          Weight:       300.2 lb Date of Birth:  July 05, 1955          BSA:          2.325 m Patient Age:    66 years            BP:           111/78 mmHg Patient Gender: F                   HR:           101 bpm. Exam Location:  Inpatient Procedure: 2D Echo, 3D Echo, Cardiac Doppler and Color Doppler Indications:    I48.91* Unspeicified atrial fibrillation  History:        Patient has prior history of  Echocardiogram examinations, most                 recent 02/02/2012. Abnormal ECG, COPD, Arrythmias:Atrial                 Fibrillation; Risk Factors:Diabetes and Hypertension.  Sonographer:    Sheralyn Boatman RDCS Referring Phys: Clydie Braun  Sonographer Comments: Patient is obese. Image acquisition challenging due to patient body habitus. Study delayed 20 minutes to get patient back in bed IMPRESSIONS  1. Left ventricular ejection fraction, by estimation, is 50%. Left ventricular ejection fraction by 3D volume is 49 %. The left ventricle has mildly decreased function. The left ventricle demonstrates global hypokinesis. There is mild left ventricular hypertrophy. Left ventricular diastolic parameters are indeterminate. There is the interventricular septum is flattened in systole, consistent with right ventricular pressure overload.  2. Right ventricular systolic function is mildly reduced. The right ventricular size is mildly enlarged. Mildly increased right ventricular wall thickness. There is mildly elevated pulmonary artery systolic pressure. The estimated right  ventricular systolic pressure is 44.8 mmHg.  3. Left atrial size was mildly dilated.  4. Right atrial size was mildly dilated.  5. The mitral valve is grossly normal. Trivial mitral valve regurgitation. No evidence of mitral stenosis. Moderate mitral annular calcification.  6. There is one beat of systolic flow reversal of hepatic vein spectral Doppler. Tricuspid valve regurgitation is moderate to severe.  7. Nodular calcification of leaflet tips. The aortic valve is tricuspid. There is mild calcification of the aortic valve. Aortic valve regurgitation is trivial. No aortic stenosis is present.  8. Aortic dilatation noted. There is mild dilatation of the ascending aorta, measuring 42 mm.  9. The inferior vena cava is dilated in size with <50% respiratory variability, suggesting right atrial pressure of 15 mmHg. Comparison(s): Unable to view prior study.  FINDINGS  Left Ventricle: Left ventricular ejection fraction, by estimation, is 50%. Left ventricular ejection fraction by 3D volume is 49 %. The left ventricle has mildly decreased function. The left ventricle demonstrates global hypokinesis. The left ventricular internal cavity size was normal in size. There is mild left ventricular hypertrophy. The interventricular septum is flattened in systole, consistent with right ventricular pressure overload. Left ventricular diastolic parameters are indeterminate. Right Ventricle: The right ventricular size is mildly enlarged. Mildly increased right ventricular wall thickness. Right ventricular systolic function is mildly reduced. There is mildly elevated pulmonary artery systolic pressure. The tricuspid regurgitant velocity is 2.73 m/s, and with an assumed right atrial pressure of 15 mmHg, the estimated right ventricular systolic pressure is 44.8 mmHg. Left Atrium: Left atrial size was mildly dilated. Right Atrium: Right atrial size was mildly dilated. Pericardium: There is no evidence of pericardial effusion. Mitral Valve: The mitral valve is grossly normal. Moderate mitral annular calcification. Trivial mitral valve regurgitation. No evidence of mitral valve stenosis. Tricuspid Valve: There is one beat of systolic flow reversal of hepatic vein spectral Doppler. The tricuspid valve is grossly normal. Tricuspid valve regurgitation is moderate to severe. No evidence of tricuspid stenosis. Aortic Valve: Nodular calcification of leaflet tips. The aortic valve is tricuspid. There is mild calcification of the aortic valve. Aortic valve regurgitation is trivial. No aortic stenosis is present. Pulmonic Valve: The pulmonic valve was normal in structure. Pulmonic valve regurgitation is not visualized. No evidence of pulmonic stenosis. Aorta: Aortic dilatation noted. There is mild dilatation of the ascending aorta, measuring 42 mm. Venous: The inferior vena cava is dilated in size  with less than 50% respiratory variability, suggesting right atrial pressure of 15 mmHg. IAS/Shunts: No atrial level shunt detected by color flow Doppler.  LEFT VENTRICLE PLAX 2D LVIDd:         4.60 cm LVIDs:         3.50 cm LV PW:         1.60 cm         3D Volume EF LV IVS:        1.20 cm         LV 3D EF:    Left                                             ventricul  ar LV Volumes (MOD)                            ejection LV vol d, MOD    69.4 ml                    fraction A2C:                                        by 3D LV vol d, MOD    64.8 ml                    volume is A4C:                                        49 %. LV vol s, MOD    28.0 ml A2C: LV vol s, MOD    29.9 ml       3D Volume EF: A4C:                           3D EF:        49 % LV SV MOD A2C:   41.4 ml       LV EDV:       170 ml LV SV MOD A4C:   64.8 ml       LV ESV:       87 ml LV SV MOD BP:    39.9 ml       LV SV:        83 ml RIGHT VENTRICLE             IVC RV S prime:     13.30 cm/s  IVC diam: 2.70 cm RVOT diam:      2.90 cm TAPSE (M-mode): 1.9 cm LEFT ATRIUM             Index        RIGHT ATRIUM           Index LA diam:        4.90 cm 2.11 cm/m   RA Area:     24.80 cm LA Vol (A2C):   64.5 ml 27.74 ml/m  RA Volume:   78.20 ml  33.64 ml/m LA Vol (A4C):   68.1 ml 29.29 ml/m LA Biplane Vol: 67.9 ml 29.20 ml/m  AORTIC VALVE LVOT Vmax:   136.50 cm/s LVOT Vmean:  93.500 cm/s LVOT VTI:    0.210 m  AORTA Ao Root diam: 3.10 cm Ao Asc diam:  4.15 cm MITRAL VALVE                TRICUSPID VALVE MV Area (PHT): 3.99 cm     TR Peak grad:   29.8 mmHg MV Decel Time: 190 msec     TR Vmax:        273.00 cm/s MV E velocity: 134.00 cm/s                             SHUNTS  Systemic VTI:  0.21 m                             Pulmonic Diam: 2.90 cm Riley Lam MD Electronically signed by Riley Lam MD Signature Date/Time: 09/07/2022/1:29:37 PM    Final    CT ABDOMEN  PELVIS W CONTRAST  Result Date: 09/06/2022 CLINICAL DATA:  Abdominal and flank pain. EXAM: CT ABDOMEN AND PELVIS WITH CONTRAST TECHNIQUE: Multidetector CT imaging of the abdomen and pelvis was performed using the standard protocol following bolus administration of intravenous contrast. RADIATION DOSE REDUCTION: This exam was performed according to the departmental dose-optimization program which includes automated exposure control, adjustment of the mA and/or kV according to patient size and/or use of iterative reconstruction technique. CONTRAST:  OMNIPAQUE IOHEXOL 300 MG/ML  SOLN COMPARISON:  Limited comparison is available with CTA chest 02/01/2012. Portable chest has also been performed today. FINDINGS: Lower chest: The heart is moderately enlarged. There is no pericardial effusion. Small hiatal hernia. There are three-vessel coronary calcifications greatest in the LAD. Scattered calcifications in the aortic valve leaflets. There are posterior atelectatic changes. Newly noted in the right lower lobe on 4:13 adjacent the posterior crest of the hemidiaphragm, there is a 1.6 cm nodular opacity. Also, at base of the right upper lobe on 4:3 there is a noncalcified 6 mm nodule not seen previously. Prompt chest CT is recommended to assess for additional nodules. Remaining lung bases are clear. Hepatobiliary: Respiratory motion limits fine detail. No liver mass is seen through the breathing motion. The liver is 21 cm length and mildly steatotic. The gallbladder and bile ducts are unremarkable. Pancreas: No abnormality is seen through breathing motion. Spleen: No abnormality is seen through the breathing motion. No splenomegaly. Adrenals/Urinary Tract: Also with limited fine detail in the kidneys due to breathing motion. No focal abnormality is suspected in the adrenal glands and kidneys. There is a 3 mm caliceal stone in the inferior pole of the right kidney. No left nephrolithiasis or other right renal stones  are seen. There is no hydronephrosis or ureteral stone. The bladder is unremarkable for the degree of distention. Stomach/Bowel: Unremarkable stomach, unremarkable unopacified small bowel. An appendix is not seen in this patient. There is moderate retained stool ascending and transverse colon. Left-sided diverticulosis but no evidence of acute diverticulitis. Vascular/Lymphatic: Aortic atherosclerosis. No enlarged abdominal or pelvic lymph nodes. There are however, multiple bilateral enlarged inguinal chain nodes, largest on the right is 2.3 cm in short axis and the largest on the left is 2 cm in short axis. Etiology indeterminate. Reproductive: The uterus is intact. There is a 1.9 cm calcified subserosal fibroid to the right along the fundus. No adnexal mass is seen. Multiple pelvic phleboliths. Other: Small umbilical and inguinal fat hernias. Mild body wall edema. Stranding in the lower abdominal wall is seen and could be part of the bilateral body wall edema or could be due to cellulitis. There is no free fluid, free hemorrhage or free air, or incarcerated hernia. Musculoskeletal: Advanced L4-5 facet hypertrophy, acquired spinal canal stenosis and grade 1 L4-5 spondylolisthesis are noted present. Degenerative changes in visualized lower thoracic spine. No acute or other significant osseous findings. IMPRESSION: 1. 1.6 cm nodular opacity in the right lower lobe and 6 mm nodule in the right upper lobe. Prompt chest CT is recommended to assess for additional nodules. 2. Cardiomegaly with calcific CAD and aortic atherosclerosis. 3. Small hiatal hernia. 4. Constipation and  diverticulosis. 5. Nonobstructive right-sided nephrolithiasis. No obstructing stones. 6. Bilateral inguinal chain adenopathy of uncertain etiology. 7. Lower abdominal wall stranding which could be part of the bilateral body wall edema or could be due to cellulitis. 8. Small umbilical and inguinal fat hernias. 9. Advanced L4-5 facet hypertrophy,  acquired spinal canal stenosis and grade 1 L4-5 spondylolisthesis. 10. Mild hepatic steatosis. Aortic Atherosclerosis (ICD10-I70.0). Electronically Signed   By: Almira Bar M.D.   On: 09/06/2022 05:16   CT L-SPINE NO CHARGE  Result Date: 09/06/2022 CLINICAL DATA:  Abdominal/flank pain with stone suspected EXAM: CT Lumbar Spine with contrast TECHNIQUE: Technique: Multiplanar CT images of the lumbar spine were reconstructed from contemporary CT of the Abdomen and Pelvis. RADIATION DOSE REDUCTION: This exam was performed according to the departmental dose-optimization program which includes automated exposure control, adjustment of the mA and/or kV according to patient size and/or use of iterative reconstruction technique. CONTRAST:  None additional COMPARISON:  None Available. FINDINGS: Segmentation: 5 lumbar type vertebrae. Alignment: Mild degenerative anterolisthesis at L4-5 and retrolisthesis at L5-S1. Vertebrae: No acute fracture or focal pathologic process. Paraspinal and other soft tissues: Reported separately Disc levels: T12- L1: Facet and costovertebral spurring eccentric to the left with mild foraminal encroachment. L1-L2: Mild disc bulging and facet spurring L2-L3: Mild disc bulging and facet spurring L3-L4: Moderate degenerative facet hypertrophy. Circumferential disc bulging. Moderate spinal and biforaminal stenosis L4-L5: Advanced facet osteoarthritis with spurring and anterolisthesis. The disc is narrowed and bulging. Advanced appearing spinal and biforaminal stenosis L5-S1:Moderate degenerative facet spurring asymmetric to the left. Mild disc bulging and mild left foraminal narrowing. IMPRESSION: 1. No acute finding. 2. Generalized lumbar spine degeneration especially affecting lower lumbar facets with L4-5 and L5-S1 anterolisthesis. 3. L4-5 high-grade spinal stenosis. 4. Foraminal impingement suspected at both L3-4 and L4-5. Electronically Signed   By: Tiburcio Pea M.D.   On: 09/06/2022  04:56   DG Chest Portable 1 View  Result Date: 09/06/2022 CLINICAL DATA:  Shortness of breath EXAM: PORTABLE CHEST 1 VIEW COMPARISON:  04/06/2013 FINDINGS: Cardiac shadow is mildly enlarged. Lungs are clear bilaterally. No bony abnormality is noted. IMPRESSION: No active disease. Electronically Signed   By: Alcide Clever M.D.   On: 09/06/2022 03:13     I have personally spent 85 minutes involved in face-to-face and non-face-to-face activities for this patient on the day of the visit. Professional time spent includes the following activities: Preparing to see the patient (review of tests), Obtaining and/or reviewing separately obtained history (admission/discharge record), Performing a medically appropriate examination and/or evaluation , Ordering medications/tests/procedures, referring and communicating with other health care professionals, Documenting clinical information in the EMR, Independently interpreting results (not separately reported), Communicating results to the patient/family/caregiver, Counseling and educating the patient/family/caregiver and Care coordination (not separately reported).  Electronically signed by:   Plan d/w requesting provider as well as ID pharm D  Note: This document was prepared using dragon voice recognition software and may include unintentional dictation errors.   Odette Fraction, MD Infectious Disease Physician [facility] Pager: [phone]

## 2022-09-10 NOTE — Progress Notes (Signed)
Occupational Therapy Treatment Patient Details Name: Linda Chase MRN: [id number] DOB: July 07, 1955 Today's Date: 09/10/2022   History of present illness Linda Chase is a 67 y.o. female who presented to [facility] ED with complaints of lower back pain however due to respiratory difficulty placed on biPap and then transferred to [facility]. HR also in 130s in a-fib. PMH: HTN, paroxysmal a-fib on chronic anticoagulation, DM II, cerebral aneurysm, and morbid obesity   OT comments  Patient up in recliner, agreeable to stand grooming task.  Overall Min A for sit to stand and CGA for standing at the sink for oral care and light grooming.  Patient declined mobility as breakfast tray arrived.  OT can continue efforts in the acute setting and Patient will benefit from continued inpatient follow up therapy, <3 hours/day to maximize functional status prior to returning home.  Patient is largely alone most of the day, son works out of the home.         If plan is discharge home, recommend the following:  A little help with walking and/or transfers;A lot of help with bathing/dressing/bathroom;Direct supervision/assist for medications management;Help with stairs or ramp for entrance;Assist for transportation;Direct supervision/assist for financial management;Assistance with cooking/housework   Equipment Recommendations       Recommendations for Other Services      Precautions / Restrictions Precautions Precautions: Fall Precaution Comments: watch SpO2 and HR Restrictions Weight Bearing Restrictions: No       Mobility Bed Mobility               General bed mobility comments: up in recliner    Transfers Overall transfer level: Needs assistance Equipment used: Rolling walker (2 wheels) Transfers: Sit to/from Stand Sit to Stand: Min assist                 Balance Overall balance assessment: Needs assistance Sitting-balance support: Feet supported, No upper extremity  supported Sitting balance-Leahy Scale: Fair     Standing balance support: Single extremity supported Standing balance-Leahy Scale: Poor                             ADL either performed or assessed with clinical judgement   ADL       Grooming: Wash/dry hands;Wash/dry face;Oral care;Contact guard assist;Standing                                      Extremity/Trunk Assessment Upper Extremity Assessment Upper Extremity Assessment: Generalized weakness   Lower Extremity Assessment Lower Extremity Assessment: Defer to PT evaluation   Cervical / Trunk Assessment Cervical / Trunk Assessment: Other exceptions Cervical / Trunk Exceptions: morbidly obese, back pain    Vision Patient Visual Report: No change from baseline     Perception Perception Perception: Not tested   Praxis Praxis Praxis: Not tested    Cognition Arousal: Alert Behavior During Therapy: Flat affect Overall Cognitive Status: No family/caregiver present to determine baseline cognitive functioning                                 General Comments: Pt resistant to directions most of the time during transitions and transfers.  Unsure if this is baseline.  Decreased safety and insight.        Exercises      Shoulder Instructions  General Comments      Pertinent Vitals/ Pain       Pain Assessment Pain Assessment: Faces Faces Pain Scale: Hurts little more Pain Location: low back with movement Pain Descriptors / Indicators: Discomfort, Grimacing, Stabbing                                                          Frequency  Min 1X/week        Progress Toward Goals  OT Goals(current goals can now be found in the care plan section)     Acute Rehab OT Goals OT Goal Formulation: Patient unable to participate in goal setting Time For Goal Achievement: 09/21/22 Potential to Achieve Goals: Fair  Plan      Co-evaluation                  AM-PAC OT "6 Clicks" Daily Activity     Outcome Measure   Help from another person eating meals?: None Help from another person taking care of personal grooming?: A Little Help from another person toileting, which includes using toliet, bedpan, or urinal?: A Lot Help from another person bathing (including washing, rinsing, drying)?: A Lot Help from another person to put on and taking off regular upper body clothing?: A Little Help from another person to put on and taking off regular lower body clothing?: A Lot 6 Click Score: 16    End of Session Equipment Utilized During Treatment: Rolling walker (2 wheels)  OT Visit Diagnosis: Unsteadiness on feet (R26.81);Other abnormalities of gait and mobility (R26.89);Muscle weakness (generalized) (M62.81);Pain;Other symptoms and signs involving cognitive function   Activity Tolerance Other (comment) (Breakfast tray arrived)   Patient Left in chair;with call bell/phone within reach   Nurse Communication Mobility status        Time: 1610-9604 OT Time Calculation (min): 12 min  Charges: OT General Charges $OT Visit: 1 Visit OT Treatments $Self Care/Home Management : 8-22 mins  09/10/2022  RP, OTR/L  Acute Rehabilitation Services  Office:  [phone]   Suzanna Obey 09/10/2022, 10:29 AM

## 2022-09-10 NOTE — Progress Notes (Signed)
Patient is requesting to have BIPAP removed, BIPAP removed and nasal cannula at 4L applied.

## 2022-09-10 NOTE — Progress Notes (Addendum)
Patient Name: Linda Chase Date of Encounter: 09/10/2022 [facility] Cardiologist: Dietrich Pates, MD   Interval Summary  .    Was somnolent yesterday due to hypercapnia requiring BiPAP.  This has been weaned off earlier this morning.  Mentation seems to be sluggish however improved compared to yesterday.  Shortness of breath has improved however still requiring supplemental oxygen on 4 L.  Generally asymptomatic from her atrial fibrillation.  Vital Signs .    Vitals:   09/10/22 0720 09/10/22 0753 09/10/22 0849 09/10/22 0916  BP:  (!) 122/104    Pulse: (!) 114 (!) 124  (!) 120  Resp:  (!) 23    Temp:  98.6 F (37 C)    TempSrc:  Oral    SpO2:  94% 96%   Weight:      Height:        Intake/Output Summary (Last 24 hours) at 09/10/2022 1122 Last data filed at 09/10/2022 0419 Gross per 24 hour  Intake 720 ml  Output 2325 ml  Net -1605 ml      09/10/2022    4:16 AM 09/09/2022    4:26 AM 09/08/2022    4:21 AM  Last 3 Weights  Weight (lbs) 305 lb 12.5 oz 289 lb 0.4 oz 300 lb 0.7 oz  Weight (kg) 138.7 kg 131.1 kg 136.1 kg      Telemetry/ECG    Atrial fibrillation heart rates 100-140 Personally Reviewed  CV Studies    Echocardiogram 09/07/2022  1. Left ventricular ejection fraction, by estimation, is 50%. Left  ventricular ejection fraction by 3D volume is 49 %. The left ventricle has  mildly decreased function. The left ventricle demonstrates global  hypokinesis. There is mild left ventricular  hypertrophy. Left ventricular diastolic parameters are indeterminate.  There is the interventricular septum is flattened in systole, consistent  with right ventricular pressure overload.   2. Right ventricular systolic function is mildly reduced. The right  ventricular size is mildly enlarged. Mildly increased right ventricular  wall thickness. There is mildly elevated pulmonary artery systolic  pressure. The estimated right ventricular  systolic pressure is 44.8  mmHg.   3. Left atrial size was mildly dilated.   4. Right atrial size was mildly dilated.   5. The mitral valve is grossly normal. Trivial mitral valve  regurgitation. No evidence of mitral stenosis. Moderate mitral annular  calcification.   6. There is one beat of systolic flow reversal of hepatic vein spectral  Doppler. Tricuspid valve regurgitation is moderate to severe.   7. Nodular calcification of leaflet tips. The aortic valve is tricuspid.  There is mild calcification of the aortic valve. Aortic valve  regurgitation is trivial. No aortic stenosis is present.   8. Aortic dilatation noted. There is mild dilatation of the ascending  aorta, measuring 42 mm.   9. The inferior vena cava is dilated in size with <50% respiratory  variability, suggesting right atrial pressure of 15 mmHg.   Comparison(s): Unable to view prior study.     Physical Exam .   GEN: No acute distress.   Neck: Difficult to assess JVD Cardiac: Irregularly irregular, tachycardic Respiratory: Mild crackles GI: Soft, nontender, non-distended  MS: 2+ edema  Patient Profile    Linda Chase is a 67 y.o. female has hx of proximal atrial fibrillation, hypertension, type 2 diabetes, OSA not on CPAP, cerebral aneurysm, morbid obesity and admitted on 09/06/2022 for for the evaluation of atrial fibrillation and CHF.  Initially presented for  acute on chronic back pain.  There is suspicion for osteomyelitis.  Assessment & Plan .     A-fib RVR Pt  with hx of afib   Was reportedly on Eliquis as outpt but she says not taking due to "shipping issues" Yesterday on IV diltiazem  HRs 80s to 90s    BP dropped and diltiazem stopped    Today, heart rates hovering between 100-140.  Pt does not sense this   Starting IV amiodarone bolus with infusion as options limited now with BP in 100s     Most likely will not convert Continue Eliquis 5 mg twice daily  Continue Toprol XL 100 for now     Acute on chronic HFpEF Echo  on 8/27 LVEF 50%   RVEF mildly reduced.    LA and RA mildly dilated   Mod to severe TR   Pt  still has considerable edema on exam She is responding decently well to IV Lasix 40 mg.  Has output 2.3 L in the last 24 hours.  Some not documented accurately due to leakage. Currently on IV Lasix 40 mg twice daily.   Follow renal function  Further GDMT to be done once she has stabilized medically   Moderate coronary atherosclerosis of the LAD and left coronary artery Noted on CT chest.  No anginal complaints.  Will order lipid. Continue statin, titrate accordingly.   Acute respiratory failure with hypercapnia Initially placed on BiPAP however this has been weaned off to supplemental oxygen via nasal cannula 4 L.  Somnolence has improved.  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  MRI is pending.  Blood cultures pending.  Diabetes type 2 A1c 7.8.  On SSI  Suspected OSA Morbid obesity Maintains oxygen saturations at night.  Likely needs CPAP at night and outpatient sleep study.      For questions or updates, please contact [facility] Please consult [email or web address] for contact info under        Signed, Abagail Kitchens, PA-C    Patient seen and examined   I have amended note above by Thamas Jaegers to reflect my findings  ON exam, Pt sitting on side of bed  Appears comfortable   A little slow answering questions but appropriat Neck  Unable to assessJVP Lungs   Mild crackle at base Cardiac Irreg irreg   No s3  Abd  Obese  Supple  Ext  1-2+ swelling   chronic skin changes   Impression  As noted above   Afib  Now on amiodarone   Rates are better She was not on Eliquis prior to admit (problems getting)  Options imited with marginal BP  Wean off as HR tolerates    Keep on Eliquis now  Continue diuresis   Follow renal functoin  Dietrich Pates MD

## 2022-09-10 NOTE — Progress Notes (Addendum)
Patient agreeable to wearing BIPAP, BIPAP on at this time

## 2022-09-10 NOTE — Progress Notes (Signed)
Pt taken to MRI. In MRI pts states she has had brain surgery in the past but is unsure if there was any metal placed in the brain during the procedure. MD notified and head CT was ordered.

## 2022-09-10 NOTE — Progress Notes (Addendum)
Cardizem gtt stopped at 8pm d/t low BP 86/59, 81/62.  BP rechecks  2113 =101/61 2230 =102/72 2346 =100/71

## 2022-09-10 NOTE — Progress Notes (Signed)
PROGRESS NOTE    Linda Chase  WUJ:[id number] DOB: 11/12/1955 DOA: 09/06/2022 PCP: Rometta Emery, MD   Brief Narrative:  Linda Chase is a 67 y.o. female with medical history significant of hypertension, paroxysmal atrial fibrillation on chronic anticoagulation,  diabetes mellitus type 2, cerebral aneurysm, and morbid obesity who presented with complaints of lower back pain.  Pain started while she was out of town last month.  She went to [place] via train and came back possibly at the beginning of this month.  She reports pain was severe and she was having associated spasms.  She has been taking muscle relaxants and naproxen for the symptoms not relieved.  She reported associated symptoms of urinary incontinence related with pain and difficulty ambulating.  Denied any recent trauma, falls, cough, fever, vomiting, or diarrhea.  She reports that she has been taking her Eliquis as prescribed.     Assessment & Plan:   Principal Problem:   Acute respiratory failure with hypercapnia (HCC) Active Problems:   Lower back pain   Leukocytosis   Abdominal wall swelling   Paroxysmal atrial fibrillation with RVR (HCC)   Chronic anticoagulation   Hypertension   Diabetes mellitus (HCC)   Abnormal CT of the abdomen   Obesity, Class III, BMI 40-49.9 (morbid obesity) (HCC)  Acute respiratory failure with hypercapnia and respiratory acidosis: Improved on 4L   Fever/possible T8-9 discitis/osteomyelitis:  MRI ordered IR consult D/w ID, hold antibiotics until aspiration/culture    Lower abdominal wall swelling     Possible acute on chronic diastolic congestive heart failure:  Continue furosemide IV Continue metoprolol    Paroxysmal atrial fibrillation with RVR on chronic anticoagulation Continue metoprolol and Eliquis Continue amiodarone    Essential hypertension Continue metoprolol    Diabetes mellitus type 2, without long-term use of insulin Continue SS  corrections    Abnormal CT abdomen and pelvis: CT also noted 1.6 nodular opacity in the right lower lobe and 6 mm nodule in the right upper lobe, nonobstructive right sided nephrolithiasis, small focal and inguinal fat hernias, and cardiomegaly with calcified coronary artery disease and aortic atherosclerosis.  Prompt CT chest is recommended by radiology.  CT chest ruled out pulmonary nodules but showed incidental possible osteomyelitis of T8/T9.   Morbid obesity/obstructive sleep apnea.    DVT prophylaxis: Eliquis   Code Status: Full Code  Family Communication:    Status is: Inpatient Remains inpatient appropriate because: Still on Cardizem drip.  Will need discharge to SNF once medically stable.   Estimated body mass index is 52.49 kg/m as calculated from the following:   Height as of this encounter: 5\' 4"  (1.626 m).   Weight as of this encounter: 138.7 kg.    Nutritional Assessment: Body mass index is 52.49 kg/m.Marland Kitchen Seen by dietician.  I agree with the assessment and plan as outlined below: Nutrition Status:        . Skin Assessment: I have examined the patient's skin and I agree with the wound assessment as performed by the wound care RN as outlined below:    Consultants:  None  Procedures:  None  Antimicrobials:  Anti-infectives (From admission, onward)    None         Subjective: Feeling better.  Still with back pain.    Objective: Vitals:   09/10/22 0753 09/10/22 0849 09/10/22 0916 09/10/22 1252  BP: (!) 122/104   102/79  Pulse: (!) 124  (!) 120 95  Resp: (!) 23   Marland Kitchen)  21  Temp: 98.6 F (37 C)   98.8 F (37.1 C)  TempSrc: Oral   Oral  SpO2: 94% 96%  94%  Weight:      Height:        Intake/Output Summary (Last 24 hours) at 09/10/2022 1749 Last data filed at 09/10/2022 1718 Gross per 24 hour  Intake 853.23 ml  Output 2525 ml  Net -1671.77 ml   Filed Weights   09/08/22 0421 09/09/22 0426 09/10/22 0416  Weight: (!) 136.1 kg 131.1 kg (!)  138.7 kg    Examination:  General exam: Appears calm and comfortable, morbidly obese, sitting up Respiratory system: Crackles. Respiratory effort normal. Cardiovascular system: S1 & S2 heard, RRR. No JVD, murmurs, rubs, gallops or clicks.  +2 pitting edema bilateral lower extremity. Gastrointestinal system: Abdomen is nondistended, soft and nontender. No organomegaly or masses felt. Normal bowel sounds heard. Central nervous system: Oriented, flat but responsive  No focal deficit. Extremities: Symmetric 5 x 5 power. Skin: No rashes, lesions or ulcers.  Psychiatry: Judgement and insight appear normal. Mood & affect appropriate. Data Reviewed: I have personally reviewed following labs and imaging studies  CBC: Recent Labs  Lab 09/06/22 0237 09/06/22 0442 09/07/22 0431 09/08/22 0338 09/09/22 1048 09/10/22 0855  WBC 11.0*  --  9.9 11.7* 11.9* 9.8  NEUTROABS 8.9*  --   --  8.8* 10.3*  --   HGB 11.0* 12.2 10.1* 10.7* 10.2* 10.0*  HCT 37.1 36.0 36.2 37.8 36.3 35.0*  MCV 83.9  --  86.0 87.5 85.4 84.7  PLT 355  --  290 277 307 301   Basic Metabolic Panel: Recent Labs  Lab 09/06/22 0237 09/06/22 0442 09/06/22 1349 09/07/22 0431 09/08/22 0338 09/09/22 0646 09/10/22 0855  NA 138 139  --  137 140 134* 135  K 3.8 3.9  --  3.8 4.2 4.0 3.9  CL 101  --   --  99 95* 94* 92*  CO2 31  --   --  29 33* 30 35*  GLUCOSE 129*  --   --  167* 139* 160* 144*  BUN 12  --   --  8 9 9 8   CREATININE 0.89  --   --  0.80 1.03* 0.95 0.84  CALCIUM 9.3  --   --  8.0* 8.6* 8.3* 8.6*  MG  --   --  2.1  --   --   --   --    GFR: Estimated Creatinine Clearance: 91.8 mL/min (by C-G formula based on SCr of 0.84 mg/dL). Liver Function Tests: Recent Labs  Lab 09/06/22 0237 09/10/22 0855  AST 16 19  ALT 12 14  ALKPHOS 68 53  BILITOT 0.4 0.7  PROT 7.8 7.5  ALBUMIN 3.8 2.6*   No results for input(s): "LIPASE", "AMYLASE" in the last 168 hours. No results for input(s): "AMMONIA" in the last 168  hours. Coagulation Profile: No results for input(s): "INR", "PROTIME" in the last 168 hours. Cardiac Enzymes: No results for input(s): "CKTOTAL", "CKMB", "CKMBINDEX", "TROPONINI" in the last 168 hours. BNP (last 3 results) No results for input(s): "PROBNP" in the last 8760 hours. HbA1C: No results for input(s): "HGBA1C" in the last 72 hours.  CBG: Recent Labs  Lab 09/09/22 1136 09/09/22 1558 09/09/22 2110 09/10/22 0642 09/10/22 1147  GLUCAP 201* 139* 208* 218* 242*   Lipid Profile: No results for input(s): "CHOL", "HDL", "LDLCALC", "TRIG", "CHOLHDL", "LDLDIRECT" in the last 72 hours. Thyroid Function Tests: No results for input(s): "TSH", "T4TOTAL", "FREET4", "  T3FREE", "THYROIDAB" in the last 72 hours.  Anemia Panel: No results for input(s): "VITAMINB12", "FOLATE", "FERRITIN", "TIBC", "IRON", "RETICCTPCT" in the last 72 hours. Sepsis Labs: Recent Labs  Lab 09/06/22 1349 09/09/22 1048  PROCALCITON <0.10 <0.10    Recent Results (from the past 240 hour(s))  Culture, blood (Routine X 2) w Reflex to ID Panel     Status: None (Preliminary result)   Collection Time: 09/09/22 10:48 AM   Specimen: BLOOD LEFT HAND  Result Value Ref Range Status   Specimen Description BLOOD LEFT HAND  Final   Special Requests   Final    BOTTLES DRAWN AEROBIC AND ANAEROBIC Blood Culture adequate volume   Culture   Final    NO GROWTH < 24 HOURS Performed at [facility], [address], [place], [place] [address]    Report Status PENDING  Incomplete  Culture, blood (Routine X 2) w Reflex to ID Panel     Status: None (Preliminary result)   Collection Time: 09/09/22 10:51 AM   Specimen: BLOOD LEFT HAND  Result Value Ref Range Status   Specimen Description BLOOD LEFT HAND  Final   Special Requests   Final    BOTTLES DRAWN AEROBIC AND ANAEROBIC Blood Culture adequate volume   Culture   Final    NO GROWTH < 24 HOURS Performed at [facility], [address], [place], [place]  [address]    Report Status PENDING  Incomplete  SARS Coronavirus 2 by RT PCR (hospital order, performed in [facility] hospital lab) *cepheid single result test* Anterior Nasal Swab     Status: None   Collection Time: 09/10/22  8:45 AM   Specimen: Anterior Nasal Swab  Result Value Ref Range Status   SARS Coronavirus 2 by RT PCR NEGATIVE NEGATIVE Final    Comment: Performed at [facility], [address], [place], [place] [address]     Radiology Studies: CT HEAD WO CONTRAST ( )  Result Date: 09/10/2022 CLINICAL DATA:  CT for MRI clearance. EXAM: CT HEAD WITHOUT CONTRAST TECHNIQUE: Contiguous axial images were obtained from the base of the skull through the vertex without intravenous contrast. RADIATION DOSE REDUCTION: This exam was performed according to the departmental dose-optimization program which includes automated exposure control, adjustment of the mA and/or kV according to patient size and/or use of iterative reconstruction technique. COMPARISON:  None Available. FINDINGS: Brain: Cystic encephalomalacia in the right parieto-occipital region. Encephalomalacia in the anterior right temporal lobe. No evidence of acute large vascular territory infarct, acute hemorrhage, mass lesion, midline shift or hydrocephalus. Vascular: Aneurysm clips in the right paraclinoid region. Skull: Right-sided craniotomy.  No evidence of acute fracture. Sinuses/Orbits: No sub clear sinuses.  No acute orbital findings. Other: No mastoid effusions. IMPRESSION: 1. Positive for aneurysm clips. MRI is likely contraindicated, but recommend correlation with prior surgical note and exact type of clip if available. 2. No evidence of acute intracranial abnormality. 3. Encephalomalacia in the right parieto-occipital region and anterior right temporal lobe. Electronically Signed   By: Feliberto Harts M.D.   On: 09/10/2022 17:34   DG CHEST PORT 1 VIEW  Result Date: 09/09/2022 CLINICAL DATA:  CHF EXAM: PORTABLE CHEST 1  VIEW COMPARISON:  CXR 09/06/22 FINDINGS: Cardiomegaly. No pleural effusion. No pneumothorax. No focal airspace opacity. No radiographically apparent displaced rib fractures. Visualized upper abdomen unremarkable. Mild pulmonary venous congestion IMPRESSION: Cardiomegaly with mild pulmonary venous congestion. No evidence of pleural effusion or overt pulmonary edema. Electronically Signed  By: Lorenza Cambridge M.D.   On: 09/09/2022 13:34    Scheduled Meds:  apixaban  5 mg Oral BID   atorvastatin  20 mg Oral Daily   cholecalciferol  2,000 Units Oral Daily   furosemide  40 mg Intravenous BID   gabapentin  300 mg Oral QHS   insulin aspart  0-15 Units Subcutaneous TID WC   insulin aspart  0-5 Units Subcutaneous QHS   metoprolol succinate  100 mg Oral Daily   polyethylene glycol  17 g Oral Daily   revefenacin  175 mcg Nebulization Daily   sodium chloride flush  3 mL Intravenous Q12H   Continuous Infusions:  amiodarone 60 mg/hr (09/10/22 1503)   Followed by   amiodarone       LOS: 4 days   Alberteen Sam, MD [facility]  09/10/2022, 5:49 PM   *Please note that this is a verbal dictation therefore any spelling or grammatical errors are due to the "Dragon [employer] One" system interpretation.  Please page via Amion and do not message via secure chat for urgent patient care matters. Secure chat can be used for non urgent patient care matters.  How to contact the [facility] Attending or Consulting provider 7A - 7P or covering provider during after hours 7P -7A, for this patient?  Check the care team in [facility] and look for a) attending/consulting [facility] provider listed and b) the [facility] team listed. Page or secure chat 7A-7P. Log into [email or web address] and use [facility] universal password to access. If you do not have the password, please contact the hospital operator. Locate the [facility] provider you are looking for under [facility] and page to a number that you can be directly reached. If you  still have difficulty reaching the provider, please page the [facility] (Director on Call) for the Hospitalists listed on amion for assistance.

## 2022-09-11 DIAGNOSIS — M4620 Osteomyelitis of vertebra, site unspecified: Secondary | ICD-10-CM

## 2022-09-11 DIAGNOSIS — I2510 Atherosclerotic heart disease of native coronary artery without angina pectoris: Secondary | ICD-10-CM | POA: Insufficient documentation

## 2022-09-11 DIAGNOSIS — I5033 Acute on chronic diastolic (congestive) heart failure: Secondary | ICD-10-CM | POA: Insufficient documentation

## 2022-09-11 DIAGNOSIS — G4733 Obstructive sleep apnea (adult) (pediatric): Secondary | ICD-10-CM | POA: Insufficient documentation

## 2022-09-11 DIAGNOSIS — J9602 Acute respiratory failure with hypercapnia: Secondary | ICD-10-CM | POA: Diagnosis not present

## 2022-09-11 LAB — COMPREHENSIVE METABOLIC PANEL
ALT: 15 U/L (ref 0–44)
AST: 21 U/L (ref 15–41)
Albumin: 2.5 g/dL — ABNORMAL LOW (ref 3.5–5.0)
Alkaline Phosphatase: 57 U/L (ref 38–126)
Anion gap: 9 (ref 5–15)
BUN: 11 mg/dL (ref 8–23)
CO2: 37 mmol/L — ABNORMAL HIGH (ref 22–32)
Calcium: 8.5 mg/dL — ABNORMAL LOW (ref 8.9–10.3)
Chloride: 90 mmol/L — ABNORMAL LOW (ref 98–111)
Creatinine, Ser: 1.02 mg/dL — ABNORMAL HIGH (ref 0.44–1.00)
GFR, Estimated: >60 mL/min (ref >60)
Glucose, Bld: 151 mg/dL — ABNORMAL HIGH (ref 70–99)
Potassium: 3.8 mmol/L (ref 3.5–5.1)
Sodium: 136 mmol/L (ref 135–145)
Total Bilirubin: 0.5 mg/dL (ref 0.3–1.2)
Total Protein: 7 g/dL (ref 6.5–8.1)

## 2022-09-11 LAB — LIPID PANEL
Cholesterol: 89 mg/dL (ref 0–200)
HDL: 42 mg/dL (ref >40)
LDL Cholesterol: 32 mg/dL (ref 0–99)
Total CHOL/HDL Ratio: 2.1 ratio
Triglycerides: 75 mg/dL (ref <150)
VLDL: 15 mg/dL (ref 0–40)

## 2022-09-11 LAB — CBC
HCT: 35 % — ABNORMAL LOW (ref 36.0–46.0)
Hemoglobin: 10 g/dL — ABNORMAL LOW (ref 12.0–15.0)
MCH: 24.2 pg — ABNORMAL LOW (ref 26.0–34.0)
MCHC: 28.6 g/dL — ABNORMAL LOW (ref 30.0–36.0)
MCV: 84.5 fL (ref 80.0–100.0)
Platelets: 242 10*3/uL (ref 150–400)
RBC: 4.14 MIL/uL (ref 3.87–5.11)
RDW: 14.6 % (ref 11.5–15.5)
WBC: 11.4 10*3/uL — ABNORMAL HIGH (ref 4.0–10.5)
nRBC: 0 % (ref 0.0–0.2)

## 2022-09-11 LAB — GLUCOSE, CAPILLARY
Glucose-Capillary: 124 mg/dL — ABNORMAL HIGH (ref 70–99)
Glucose-Capillary: 225 mg/dL — ABNORMAL HIGH (ref 70–99)
Glucose-Capillary: 207 mg/dL — ABNORMAL HIGH (ref 70–99)
Glucose-Capillary: 170 mg/dL — ABNORMAL HIGH (ref 70–99)

## 2022-09-11 NOTE — Assessment & Plan Note (Signed)
-   Continue atorvastatin, metoprolol

## 2022-09-11 NOTE — Assessment & Plan Note (Signed)
Seems stable, not the main driver of her respiratory failure - Continue LAMA

## 2022-09-11 NOTE — Assessment & Plan Note (Signed)
Still appears volume overloaded still on oxygen Net negative 500cc yesterday, 7L on admission, Cr stable - Continue IV Lasix - Supplement K - Strict ins and outs - Daily weights - Consult cardiology, appreciate recommendations

## 2022-09-11 NOTE — Progress Notes (Signed)
Progress Note   Patient: Linda Chase [id number] DOB: 10-24-1955 DOA: 09/06/2022     5 DOS: the patient was seen and examined on 09/11/2022 at 10:13AM      Brief hospital course: Mrs. Mckiver is a 67 y.o. F with MO, OSA not on CPAP, pAF on Eliquis, COPD, DM, HTN, hx cerebral aneurysm s/p clipping in 2006 in [place] who presented with over a month of progressive mid back pain, now severe.     Significant events: 8/26: Found to have Afib rate 130s, pH 7.2, pCO2 80, admitted on BiPAP and dilt drip; NSGY consulted for back pain 8/28: CT chest showed T8-9 endplate changes 8/29: Cardiology consulted for Afib 8/30: Unable to obtain MRI due to aneurysm clips of unknown make; ID consulted 8/31: IR consulted for aspiration    Significant studies: 8/26 CT abdomen and pelvis with contrast: body wall edema, lung nodule, cardiomegaly 8/26 CT L spine: nonspecific degenerative disease 8/27 Echo: EF 50%, global hypokinesis, signs of increased RVP 8/28 CT chest: T8-9 end plate changes; lung nodules resolved; mediastinal adenopathy noted; repeat CT chest in 3 months recommended 8/30 CT head: NAICP, clips noted   Significant microbiology data: 8/29 BCx x2: NGTD 8/30 COVID: negative    Procedures:    Consults: Neurosurgery Cardiology ID IR      Assessment and Plan: * Acute on chronic respiratory failure with hypercapnia (HCC) Patient developed somnolence and respiratory acidosis, improved with BiPAP.  Mentation now at baseline.  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   - Request records from [facility] [place] for 2006 clipping - Hold Abx for now - Consult ID - Consult IR for aspiration of joint space    Paroxysmal atrial fibrillation with RVR (HCC) Rates now controlled on amiodarone - Continue amiodarone per Cardiology -Continue metoprolol and Eliquis  OSA  (obstructive sleep apnea) - Needs NIPPV at night  Acute on chronic heart failure with preserved ejection fraction (HFpEF) (HCC) Still appears volume overloaded still on oxygen - Continue IV Lasix - Monitor potassium and renal function daily - Strict ins and outs - Daily weights - Consult cardiology, appreciate recommendations  Coronary artery disease involving native coronary artery of native heart without angina pectoris - Continue atorvastatin, metoprolol  Obesity, Class III, BMI 40-49.9 (morbid obesity) (HCC) BMI 53  COPD (chronic obstructive pulmonary disease) (HCC) No evidence of wheezing or flare - Continue LAMA  Hypertension Blood pressure normal - Continue metoprolol, furosemide - Hold HCTZ, amlodipine  Diabetes mellitus (HCC) Glucose controlled - Continue sliding scale corrections - Continue gabapentin for diabetic related neuropathy -Hold home metformin and Januvia while in the hospital          Subjective: Patient still has some back pain, adequately controlled with oral agents.  Rates are controlled on amiodarone.  Appreciate cardiology and infectious disease interventional radiology input.  Unable to obtain MRI yesterday due to aneurysm clips of unknown etiology.     Physical Exam: BP 103/68   Pulse (!) 105   Temp 98.7 F (37.1 C) (Oral)   Resp (!) 25   Ht 5\' 4"  (1.626 m)   Wt (!) 140 kg   SpO2 95%   BMI 52.98 kg/m   Obese adult female, sitting up in recliner, interactive and appropriate, oxygen on her chin, sats normal Heart rate irregular, sounds distant I did not appreciate murmurs but probably could not, no pitting in  the lower extremities although she has brawny venous stasis change Respiratory effort seems relatively unlabored, but is extremely shallow and diminished I cannot hear anything Abdomen soft without tenderness to palpation, distention Affect flat, judgment and insight appear normal, mild psychomotor slowing but this may be her  baseline, generalized weakness but symmetric strength, speech fluent    Data Reviewed: Discussed with interventional radiology CRP and sed rate elevated 6.5 and 57 respectively Hemoglobin 10 Glucose normal LDL 32 Patient metabolic panel and LFTs normal   Family Communication: None    Disposition: Status is: Inpatient         Author: Alberteen Sam, MD 09/11/2022 11:38 AM  For on call review [email or web address].

## 2022-09-11 NOTE — Assessment & Plan Note (Signed)
Blood pressure to soft - Continue metoprolol, furosemide - Hold HCTZ, amlodipine

## 2022-09-11 NOTE — Assessment & Plan Note (Addendum)
Glucose elevated today - Continue sliding scale corrections - Continue gabapentin for diabetic related neuropathy - Hold home metformin and Januvia while in the hospital

## 2022-09-11 NOTE — Assessment & Plan Note (Signed)
BMI 53 

## 2022-09-11 NOTE — Progress Notes (Addendum)
Mobility Specialist Progress Note    09/11/22 1222  Mobility  Activity Ambulated with assistance in hallway  Level of Assistance +2 (takes two people) (chair follow)  Assistive Device Front wheel walker  Distance Ambulated (ft) 200 ft  Activity Response Tolerated well  Mobility Referral Yes  $Mobility charge 1 Mobility  Mobility Specialist Start Time (ACUTE ONLY) 1152  Mobility Specialist Stop Time (ACUTE ONLY) 1222  Mobility Specialist Time Calculation (min) (ACUTE ONLY) 30 min   Pre-Mobility: 73 HR, 97% SpO2 Post-Mobility: 94 HR, 111/89 BP  Pt received in chair and agreeable. No complaints. Pt modA+2 to stand from chair and pivot to [facility]. Pt voided. Pt modA+1 with verbal cues to stand from [facility]. Unable to obtain vitals during session. Rolled back in and left sitting in chair with call bell in reach. RN aware.   [place] Mobility Specialist  Please Neurosurgeon or Rehab Office at [phone]

## 2022-09-11 NOTE — Assessment & Plan Note (Addendum)
Unknown if clips are MR compatible.   - Request records from [facility] for 2006 clipping - Hold Abx for now - Consult ID - Consult IR for aspiration of joint space

## 2022-09-11 NOTE — Assessment & Plan Note (Signed)
Rates now controlled on amiodarone - Continue amiodarone per Cardiology -Continue metoprolol and Eliquis

## 2022-09-11 NOTE — Progress Notes (Signed)
   Patient Name: Linda Chase Date of Encounter: 09/11/2022 [facility] Cardiologist: Dietrich Pates, MD   Interval Summary  .    States she is feeling better.  Did not obtain MRI because there was some question about an aneurysm clip in her brain.  The amiodarone has slowed down her atrial fibrillation currently in the 80s.  Vital Signs .    Vitals:   09/11/22 0500 09/11/22 0622 09/11/22 0630 09/11/22 0723  BP:  116/83 101/80   Pulse:  82 84   Resp:  17 14   Temp:  98.5 F (36.9 C)    TempSrc:  Oral    SpO2:  100% 97% 97%  Weight: (!) 140 kg     Height:        Intake/Output Summary (Last 24 hours) at 09/11/2022 0810 Last data filed at 09/11/2022 0000 Gross per 24 hour  Intake 238.06 ml  Output 1000 ml  Net -761.94 ml      09/11/2022    5:00 AM 09/10/2022    4:16 AM 09/09/2022    4:26 AM  Last 3 Weights  Weight (lbs) 308 lb 10.3 oz 305 lb 12.5 oz 289 lb 0.4 oz  Weight (kg) 140 kg 138.7 kg 131.1 kg      Telemetry/ECG    Atrial fibrillation heart rates previously between 100 140, now in the 80s- Personally Reviewed  Echocardiogram 09/07/2022 showed EF of 49% mildly reduced right ventricular contraction with PA pressure 45 mmHg mildly dilated by atria aortic ascending 42 mm mildly dilated tricuspid regurgitation moderate to severe.  Physical Exam .   GEN: No acute distress.   Neck: Challenging to assess JVD Cardiac: Irregularly irregular, no murmurs, rubs, or gallops.  Respiratory: Crackles bilaterally. GI: Soft, nontender, non-distended  MS: 2+ lower extremity edema  Assessment & Plan .     67 year old with possible T8-9 discitis osteomyelitis with fever acute respiratory failure with hypercapnia and respiratory acidosis paroxysmal atrial fibrillation on chronic anticoagulation morbid obesity who presented with back pain.  Hypercapnic respiratory failure - Yesterday somnolent requiring BiPAP.  4 L oxygen.  Weight 305 pounds.  Atrial fibrillation  with rapid ventricular response - Off of Eliquis because of shipping issues as outpatient. - Blood pressure drop because of diltiazem previously - IV amiodarone bolus with infusion administered on 09/10/2022 -Also on Toprol-XL 100 mg a day  Chronic anticoagulation - Continue with Eliquis 5 mg twice a day for stroke prophylaxis  Acute on chronic diastolic heart failure - IV Lasix 40 mg twice a day.  Following renal function.  Creatinine 1.02 up from 0.84, but still remains at baseline.  Bicarb 37 chronic hypercapnia  Moderate coronary artery disease LAD - Noted on CT scan, interpreted.  No angina.  Secondary prevention.  Atorvastatin 20 mg.  Discussed with nursing team  For questions or updates, please contact [facility] Please consult [email or web address] for contact info under        Signed, Donato Schultz, MD

## 2022-09-11 NOTE — Hospital Course (Signed)
Linda Chase is a 67 y.o. F with MO, OSA not on CPAP, pAF on Eliquis, COPD, DM, HTN, hx cerebral aneurysm s/p clipping in 2006 in [place] who presented with over a month of progressive mid back pain, now severe.     Significant events: 8/26: Found to have Afib rate 130s, pH 7.2, pCO2 80, admitted on BiPAP and dilt drip; NSGY consulted for back pain 8/28: CT chest showed T8-9 endplate changes 8/29: Cardiology consulted for Afib 8/30: Unable to obtain MRI due to aneurysm clips of unknown make; ID consulted; amiodarone started 8/31: IR consulted for aspiration 9/1: Amiodarone switched to PO 9/5: Underwent aspiration of disc space    Significant studies: 8/26 CT abdomen and pelvis with contrast: body wall edema, lung nodule, cardiomegaly 8/26 CT L spine: nonspecific degenerative disease 8/27 Echo: EF 50%, global hypokinesis, signs of increased RVP 8/28 CT chest: T8-9 end plate changes; lung nodules resolved; mediastinal adenopathy noted; repeat CT chest in 3 months recommended 8/30 CT head: NAICP, clips noted   Significant microbiology data: 8/29 BCx x2: NGTD 8/30 COVID: negative 9/5 body fluid culture disc space: GS neg    Procedures: 9/5: Disc aspiration    Consults: Neurosurgery Cardiology ID IR

## 2022-09-11 NOTE — Assessment & Plan Note (Addendum)
Patient developed somnolence and respiratory acidosis, improved with BiPAP.  Mentation now at baseline.  Repeat ABG shows baseline paCO2 around 70   Only accepted BiPAP last night for ~2 hrs

## 2022-09-11 NOTE — Progress Notes (Signed)
   09/10/22 2325  BiPAP/CPAP/SIPAP  $ Face Mask Medium Yes  BiPAP/CPAP/SIPAP Pt Type Adult  BiPAP/CPAP/SIPAP SERVO  Mask Type Full face mask  Mask Size Medium  Set Rate 24 breaths/min  PEEP 5 cmH20  FiO2 (%) 30 %  Minute Ventilation 13.5  Leak 43  Peak Inspiratory Pressure (PIP) 20  Patient Home Equipment No  Auto Titrate No  Press High Alarm 25 cmH2O  Safety Check Completed by RT for Home Unit Yes, no issues noted

## 2022-09-11 NOTE — Assessment & Plan Note (Signed)
-   Needs NIPPV at night

## 2022-09-12 DIAGNOSIS — I159 Secondary hypertension, unspecified: Secondary | ICD-10-CM

## 2022-09-12 DIAGNOSIS — I5033 Acute on chronic diastolic (congestive) heart failure: Secondary | ICD-10-CM | POA: Diagnosis not present

## 2022-09-12 DIAGNOSIS — M4620 Osteomyelitis of vertebra, site unspecified: Secondary | ICD-10-CM | POA: Diagnosis not present

## 2022-09-12 DIAGNOSIS — E1169 Type 2 diabetes mellitus with other specified complication: Secondary | ICD-10-CM

## 2022-09-12 DIAGNOSIS — I480 Paroxysmal atrial fibrillation: Secondary | ICD-10-CM | POA: Diagnosis not present

## 2022-09-12 DIAGNOSIS — I2510 Atherosclerotic heart disease of native coronary artery without angina pectoris: Secondary | ICD-10-CM

## 2022-09-12 DIAGNOSIS — J449 Chronic obstructive pulmonary disease, unspecified: Secondary | ICD-10-CM

## 2022-09-12 DIAGNOSIS — J9602 Acute respiratory failure with hypercapnia: Secondary | ICD-10-CM | POA: Diagnosis not present

## 2022-09-12 LAB — COMPREHENSIVE METABOLIC PANEL
ALT: 17 U/L (ref 0–44)
AST: 21 U/L (ref 15–41)
Albumin: 2.7 g/dL — ABNORMAL LOW (ref 3.5–5.0)
Alkaline Phosphatase: 58 U/L (ref 38–126)
Anion gap: 11 (ref 5–15)
BUN: 11 mg/dL (ref 8–23)
CO2: 35 mmol/L — ABNORMAL HIGH (ref 22–32)
Calcium: 8.8 mg/dL — ABNORMAL LOW (ref 8.9–10.3)
Chloride: 90 mmol/L — ABNORMAL LOW (ref 98–111)
Creatinine, Ser: 0.82 mg/dL (ref 0.44–1.00)
GFR, Estimated: >60 mL/min (ref >60)
Glucose, Bld: 166 mg/dL — ABNORMAL HIGH (ref 70–99)
Potassium: 3.8 mmol/L (ref 3.5–5.1)
Sodium: 136 mmol/L (ref 135–145)
Total Bilirubin: 0.5 mg/dL (ref 0.3–1.2)
Total Protein: 7.7 g/dL (ref 6.5–8.1)

## 2022-09-12 LAB — CBC
HCT: 36.4 % (ref 36.0–46.0)
Hemoglobin: 10.4 g/dL — ABNORMAL LOW (ref 12.0–15.0)
MCH: 24.1 pg — ABNORMAL LOW (ref 26.0–34.0)
MCHC: 28.6 g/dL — ABNORMAL LOW (ref 30.0–36.0)
MCV: 84.3 fL (ref 80.0–100.0)
Platelets: 298 10*3/uL (ref 150–400)
RBC: 4.32 MIL/uL (ref 3.87–5.11)
RDW: 14.6 % (ref 11.5–15.5)
WBC: 12 10*3/uL — ABNORMAL HIGH (ref 4.0–10.5)
nRBC: 0 % (ref 0.0–0.2)

## 2022-09-12 LAB — GLUCOSE, CAPILLARY
Glucose-Capillary: 140 mg/dL — ABNORMAL HIGH (ref 70–99)
Glucose-Capillary: 177 mg/dL — ABNORMAL HIGH (ref 70–99)
Glucose-Capillary: 169 mg/dL — ABNORMAL HIGH (ref 70–99)
Glucose-Capillary: 210 mg/dL — ABNORMAL HIGH (ref 70–99)

## 2022-09-12 MED ORDER — FUROSEMIDE 10 MG/ML IJ SOLN
80.0000 mg | Freq: Two times a day (BID) | INTRAMUSCULAR | Status: AC
  Administered 2022-09-12 – 2022-09-20 (×16): 80 mg via INTRAVENOUS
  Filled 2022-09-12 (×19): qty 8

## 2022-09-12 MED ORDER — POTASSIUM CHLORIDE CRYS ER 20 MEQ PO TBCR
40.0000 meq | EXTENDED_RELEASE_TABLET | Freq: Once | ORAL | Status: AC
  Administered 2022-09-12: 40 meq via ORAL
  Filled 2022-09-12: qty 2

## 2022-09-12 MED ORDER — AMIODARONE HCL 200 MG PO TABS
400.0000 mg | ORAL_TABLET | Freq: Two times a day (BID) | ORAL | Status: DC
  Administered 2022-09-12 – 2022-09-20 (×17): 400 mg via ORAL
  Filled 2022-09-12 (×17): qty 2

## 2022-09-12 MED ORDER — HYDROCERIN EX CREA
TOPICAL_CREAM | CUTANEOUS | Status: DC | PRN
  Filled 2022-09-12 (×2): qty 113

## 2022-09-12 NOTE — Progress Notes (Signed)
Progress Note   Patient: Linda Chase [id number] DOB: [birth year] DOA: 09/06/2022     6 DOS: the patient was seen and examined on 09/12/2022 at 10:40AM      Brief hospital course: Mrs. Marple is a 67 y.o. F with MO, OSA not on CPAP, pAF on Eliquis, COPD, DM, HTN, hx cerebral aneurysm s/p clipping in 2006 in [place] who presented with over a month of progressive mid back pain, now severe.   See summary from 8/31     Assessment and Plan: * Acute on chronic respiratory failure with hypercapnia (HCC) Patient developed somnolence and respiratory acidosis, improved with BiPAP.  Mentation now at baseline.  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  Patient difficult to engage, worsened by hypercapnia, and so initially providers misunderstood this as "low back pain" which she has chronically, and did imaging and NSGY consultation for that, but there were no acute findings.  Then CT chest on 8/28 in the setting of worsening respiratory status incidentally showed T8-9 endplate changes  Unable to get MRI due to aneurysm clips of unknown MR compatibility.  IR consulted - Request records from [facility] for 2006 clipping - Hold Abx until aspirate can be obtained - Aspirate pending Eliquis washout - Consult ID    Paroxysmal atrial fibrillation with RVR (HCC) Rates controlled - Continue amiodarone, now PO  - Continue metoprolol - Hold Eliquis for vertebral aspiration, resume after  OSA (obstructive sleep apnea) - Needs NIPPV at night  Acute on chronic heart failure with preserved ejection fraction (HFpEF) (HCC) Still appears volume overloaded still on oxygen Net negative 500cc yesterday, 7L on admission, Cr stable - Continue IV Lasix - Supplement K - Strict ins and outs - Daily weights - Consult  cardiology, appreciate recommendations  Coronary artery disease involving native coronary artery of native heart without angina pectoris - Continue atorvastatin, metoprolol  Obesity, Class III, BMI 40-49.9 (morbid obesity) (HCC) BMI 53  COPD (chronic obstructive pulmonary disease) (HCC) No evidence of wheezing or flare - Continue LAMA  Hypertension Blood pressure to soft - Continue metoprolol, furosemide - Hold HCTZ, amlodipine  Diabetes mellitus (HCC) Glucose controlled - Continue sliding scale corrections - Continue gabapentin for diabetic related neuropathy - Hold home metformin and Januvia while in the hospital          Subjective: Patient has no complaints, pain is well-controlled, cardiology switched to p.o. amiodarone, aspirate is pending Eliquis washout, no new nursing concerns      Physical Exam: BP 106/83 (BP Location: Right Wrist)   Pulse 87   Temp 97.6 F (36.4 C) (Oral)   Resp (!) 21   Ht 5\' 4"  (1.626 m)   Wt (!) 136.1 kg   SpO2 92%   BMI 51.51 kg/m   Obese adult female, sitting up in chair, interactive and appropriate Heart rhythm regular, rate normal, JVP not visible due to body habitus, chronic venous changes to both legs Respiratory effort seems normal, does not sound out of breath, lung sounds severely diminished and shallow Abdomen without tenderness palpation Affect flat, judgment and insight appear normal, patient has mild psychomotor slowing and limited engagement, but suspect this is her baseline, with generalized weakness, oriented x 3  Data Reviewed: Glucose normal Basic metabolic panel unremarkable CBC shows hemoglobin 10.9, no change, white blood  cell 12, no change  Family Communication:     Disposition: Status is: Inpatient         Author: Alberteen Sam, MD 09/12/2022 11:35 AM  For on call review [email or web address].

## 2022-09-12 NOTE — Progress Notes (Signed)
   Patient Name: Linda Chase Date of Encounter: 09/12/2022 [facility] Cardiologist: Dietrich Pates, MD   Interval Summary  .    Unable to obtain MRI of back to evaluate osteomyelitis because of prior aneurysmal clip to brain according to radiology.  Seems to be slowly improving.  She states that she was not taking Lasix at home.  Did not know if she was post to take it.  Currently sitting up in chair.  Vital Signs .    Vitals:   09/12/22 0148 09/12/22 0500 09/12/22 0622 09/12/22 0739  BP:   113/86 110/85  Pulse:   92 89  Resp:   20 19  Temp:   98 F (36.7 C) 97.6 F (36.4 C)  TempSrc:   Oral Oral  SpO2: 98%  95% 95%  Weight:  (!) 136.1 kg    Height:        Intake/Output Summary (Last 24 hours) at 09/12/2022 0810 Last data filed at 09/12/2022 0740 Gross per 24 hour  Intake 240 ml  Output 750 ml  Net -510 ml      09/12/2022    5:00 AM 09/11/2022    5:00 AM 09/10/2022    4:16 AM  Last 3 Weights  Weight (lbs) 300 lb 1.6 oz 308 lb 10.3 oz 305 lb 12.5 oz  Weight (kg) 136.124 kg 140 kg 138.7 kg      Telemetry/ECG    Atrial fibrillation at rate 70s to 80s- Personally Reviewed  Physical Exam .   GEN: No acute distress.   Neck: Unable to assess JVD Cardiac: Irregularly irregular  no murmurs, rubs, or gallops.  Respiratory: Clear to auscultation bilaterally. GI: Soft, nontender, non-distended  MS: 2+ bilateral chronic edema  Assessment & Plan .     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  Acute on chronic diastolic heart failure - Continuing with IV Lasix,  increased to 80 mg twice daily.  Syndrome mostly driven by morbid obesity.  Not a candidate for SGLT2 inhibitors because of morbid obesity and increased risk of perineal infection.  Continue to monitor creatinine.  Currently stable 0.8 to  Paroxysmal atrial fibrillation with rapid ventricular response - Currently rates are controlled with amiodarone.  I will change to p.o. amiodarone 400 mg twice a day for the next 7 days, 200 mg twice a day for 14 days then 200 mg a day thereafter.-No longer on diltiazem because of hypotension.  Continue with metoprolol.  Chronic anticoagulation - Continue with Eliquis.  Severe obstructive sleep apnea/obesity hypoventilation syndrome - BiPAP at night, as much as patient accepts.  Coronary artery disease - Atorvastatin 20 mg, metoprolol. -Diagnosed from CT scan coronary calcification noted.  Vertebral osteomyelitis - IV antibiotics  High risk for readmissions given severe chronic medical conditions   For questions or updates, please contact  [facility] Please consult [email or web address] for contact info under        Signed, Donato Schultz, MD

## 2022-09-12 NOTE — Progress Notes (Signed)
   09/12/22 2117  BiPAP/CPAP/SIPAP  $ Non-Invasive Home Ventilator  Subsequent  BiPAP/CPAP/SIPAP Pt Type Adult  BiPAP/CPAP/SIPAP SERVO  Reason BIPAP/CPAP not in use  (pt said she is not ready to go on bipap machine at the moment)  Mask Type Full face mask

## 2022-09-12 NOTE — Progress Notes (Signed)
Mobility Specialist Progress Note    09/12/22 1412  Mobility  Activity Ambulated with assistance in hallway  Level of Assistance Minimal assist, patient does 75% or more  Assistive Device Front wheel walker  Distance Ambulated (ft) 160 ft  Activity Response Tolerated fair  Mobility Referral Yes  $Mobility charge 1 Mobility  Mobility Specialist Start Time (ACUTE ONLY) 1355  Mobility Specialist Stop Time (ACUTE ONLY) 1410  Mobility Specialist Time Calculation (min) (ACUTE ONLY) 15 min   Pre-Mobility: 74 HR, 94% SpO2 During Mobility: 109 HR Post-Mobility: 90 HR, 96% SpO2  Pt received in chair and agreeable. C/o arm pain from IV. During walk, pt leaned into railing on walls. Took multiple short standing rest breaks to stretch and shake her arms. Pt periodically groaning. Returned to [facility]. Left with call bell in reach and encouraged to call out for assistance once done.   [place] Mobility Specialist  Please Neurosurgeon or Rehab Office at [phone]

## 2022-09-12 NOTE — Progress Notes (Addendum)
Pt requesting to place Bipap at 2 am as she still eating. Will follow up.  2am . Bipap placed. Care ongoing  4am: Pt took her Bipap mask off and refused to put back. Pt is sating 94% in room air while resting but will desat with activity. Placed back 2L [place]. Care ongoing

## 2022-09-13 DIAGNOSIS — G4733 Obstructive sleep apnea (adult) (pediatric): Secondary | ICD-10-CM

## 2022-09-13 DIAGNOSIS — I2781 Cor pulmonale (chronic): Secondary | ICD-10-CM

## 2022-09-13 DIAGNOSIS — M4620 Osteomyelitis of vertebra, site unspecified: Secondary | ICD-10-CM

## 2022-09-13 DIAGNOSIS — E662 Morbid (severe) obesity with alveolar hypoventilation: Secondary | ICD-10-CM | POA: Insufficient documentation

## 2022-09-13 DIAGNOSIS — I5033 Acute on chronic diastolic (congestive) heart failure: Secondary | ICD-10-CM

## 2022-09-13 DIAGNOSIS — D72829 Elevated white blood cell count, unspecified: Secondary | ICD-10-CM | POA: Diagnosis not present

## 2022-09-13 DIAGNOSIS — I2721 Secondary pulmonary arterial hypertension: Secondary | ICD-10-CM

## 2022-09-13 DIAGNOSIS — F17210 Nicotine dependence, cigarettes, uncomplicated: Secondary | ICD-10-CM | POA: Diagnosis not present

## 2022-09-13 DIAGNOSIS — I4819 Other persistent atrial fibrillation: Secondary | ICD-10-CM

## 2022-09-13 DIAGNOSIS — J9602 Acute respiratory failure with hypercapnia: Secondary | ICD-10-CM | POA: Diagnosis not present

## 2022-09-13 DIAGNOSIS — I50813 Acute on chronic right heart failure: Secondary | ICD-10-CM | POA: Diagnosis not present

## 2022-09-13 LAB — CBC
HCT: 36.1 % (ref 36.0–46.0)
Hemoglobin: 10.4 g/dL — ABNORMAL LOW (ref 12.0–15.0)
MCH: 24.8 pg — ABNORMAL LOW (ref 26.0–34.0)
MCHC: 28.8 g/dL — ABNORMAL LOW (ref 30.0–36.0)
MCV: 86 fL (ref 80.0–100.0)
Platelets: 350 10*3/uL (ref 150–400)
RBC: 4.2 MIL/uL (ref 3.87–5.11)
RDW: 14.6 % (ref 11.5–15.5)
WBC: 12.2 10*3/uL — ABNORMAL HIGH (ref 4.0–10.5)
nRBC: 0 % (ref 0.0–0.2)

## 2022-09-13 LAB — COMPREHENSIVE METABOLIC PANEL
ALT: 18 U/L (ref 0–44)
AST: 21 U/L (ref 15–41)
Albumin: 2.8 g/dL — ABNORMAL LOW (ref 3.5–5.0)
Alkaline Phosphatase: 66 U/L (ref 38–126)
Anion gap: 8 (ref 5–15)
BUN: 13 mg/dL (ref 8–23)
CO2: 37 mmol/L — ABNORMAL HIGH (ref 22–32)
Calcium: 8.7 mg/dL — ABNORMAL LOW (ref 8.9–10.3)
Chloride: 90 mmol/L — ABNORMAL LOW (ref 98–111)
Creatinine, Ser: 0.94 mg/dL (ref 0.44–1.00)
GFR, Estimated: >60 mL/min (ref >60)
Glucose, Bld: 186 mg/dL — ABNORMAL HIGH (ref 70–99)
Potassium: 3.7 mmol/L (ref 3.5–5.1)
Sodium: 135 mmol/L (ref 135–145)
Total Bilirubin: 0.6 mg/dL (ref 0.3–1.2)
Total Protein: 8.1 g/dL (ref 6.5–8.1)

## 2022-09-13 LAB — GLUCOSE, CAPILLARY
Glucose-Capillary: 215 mg/dL — ABNORMAL HIGH (ref 70–99)
Glucose-Capillary: 151 mg/dL — ABNORMAL HIGH (ref 70–99)
Glucose-Capillary: 179 mg/dL — ABNORMAL HIGH (ref 70–99)
Glucose-Capillary: 238 mg/dL — ABNORMAL HIGH (ref 70–99)

## 2022-09-13 MED ORDER — SODIUM CHLORIDE 0.9 % IV SOLN: 2.0000 g | INTRAVENOUS | Status: DC

## 2022-09-13 NOTE — Progress Notes (Signed)
Patient Name: Linda Chase Date of Encounter: 09/13/2022 [facility] Cardiologist: Dietrich Pates, MD   Interval Summary  .    Still has moderate back pain, but is not in distress.   Falls asleep repeatedly during our discussion.  Has refused to wear BiPAP.   Good urine output.  Normal renal function and electrolytes.  Notes marked metabolic alkalosis, compensatory for chronic respiratory acidosis.  Vital Signs .    Vitals:   09/12/22 2243 09/13/22 0442 09/13/22 0517 09/13/22 0742  BP: 107/65 107/84  106/72  Pulse: 86 86  99  Resp: 19 20  (!) 26  Temp: 98.3 F (36.8 C) 97.8 F (36.6 C)  97.8 F (36.6 C)  TempSrc: Oral Oral  Oral  SpO2: 96% 97%  93%  Weight:   135.9 kg   Height:        Intake/Output Summary (Last 24 hours) at 09/13/2022 0828 Last data filed at 09/13/2022 0524 Gross per 24 hour  Intake 960 ml  Output 2900 ml  Net -1940 ml      09/13/2022    5:17 AM 09/12/2022    5:00 AM 09/11/2022    5:00 AM  Last 3 Weights  Weight (lbs) 299 lb 9.7 oz 300 lb 1.6 oz 308 lb 10.3 oz  Weight (kg) 135.9 kg 136.124 kg 140 kg      Telemetry/ECG    Atrial fibrillation with controlled ventricular response- Personally Reviewed  Physical Exam .   GEN: No acute distress.  Super-morbid obesity Neck: No JVD Cardiac: Irregular, unable to hear any murmurs, rubs, or gallops.  Respiratory: Clear to auscultation bilaterally. GI: Soft, nontender, non-distended  MS: 2+ ankle edema, symmetrical with chronic skin changes  Assessment & Plan .     CHF: She has mostly is not entirely right heart failure due to chronic cor pulmonale (obesity hypoventilation syndrome).  Good response to diuretics without any deterioration in renal function.  Continue diuresis.  Not a good candidate for SGLT2 inhibitors. Persistent atrial fibrillation: Now with controlled ventricular response.  Did not tolerate conventional AV nodal blocking agents due to hypotension, now on amiodarone.  Can  consider cardioversion after a minimum of 4 weeks of amiodarone therapy, but due to her chronic untreated obesity hypoventilation syndrome, long-term maintenance of sinus rhythm is unlikely to be successful. Anticoagulation: No overt bleeding since this was started about 6 months ago.   Anemia: She has chronic anemia.  This would be mild for the average patient, but in view of her chronic respiratory problems she would be expected to be polycythemic so would categorize this as moderate anemia.  Normocytic, hypochromic with normal RBC.  There is likely to be a component of thalassemia. Obesity hypoventilation syndrome/obstructive sleep apnea/pulmonary artery hypertension/acute on chronic cor pulmonale: She has been refusing BiPAP.  She needs at least oxygen while awake and BiPAP while asleep. Moderate-severe tricuspid insufficiency: Secondary to pulmonary hypertension and RV dysfunction.  No evidence of vegetations on transthoracic echo. Thoracic vertebral osteomyelitis: Source uncertain.  Blood cultures are negative to date.  The possibility of endocarditis is considered, but no obvious vegetations are seen on the transthoracic echo and there is no evidence of significant left heart valvular disease.  The only meaningful valvular abnormality is tricuspid regurgitation, which is probably secondary to RV dilation and dysfunction.  Transesophageal echocardiography would be more sensitive for detection of endocarditis, but I do not think this would change her management since she will require protracted treatment with IV antibiotics  for the osteomyelitis anyway.  TEE is also not a low risk procedure due to the risk of sedation in this patient with severe hypoventilation.  Will consider TEE only if there is evidence of left heart valvular involvement on follow-up transthoracic echo. (Even then, I doubt that she would be a candidate for cardiac surgery). Coronary artery calcifications: She does not have angina  pectoris.  On statin and metoprolol.  Not on aspirin due to full anticoagulation.  For questions or updates, please contact [facility] Please consult [email or web address] for contact info under        Signed, Thurmon Fair, MD

## 2022-09-13 NOTE — Plan of Care (Signed)
  Problem: Education: Goal: Knowledge of General Education information will improve Description: Including pain rating scale, medication(s)/side effects and non-pharmacologic comfort measures Outcome: Progressing   Problem: Health Behavior/Discharge Planning: Goal: Ability to manage health-related needs will improve Outcome: Progressing   Problem: Education: Goal: Ability to describe self-care measures that may prevent or decrease complications (Diabetes Survival Skills Education) will improve Outcome: Progressing Goal: Individualized Educational Video(s) Outcome: Progressing

## 2022-09-13 NOTE — Plan of Care (Signed)

## 2022-09-13 NOTE — Progress Notes (Signed)
   09/13/22 2216  BiPAP/CPAP/SIPAP  $ Non-Invasive Home Ventilator  Subsequent  BiPAP/CPAP/SIPAP Pt Type Adult  BiPAP/CPAP/SIPAP SERVO  Reason BIPAP/CPAP not in use  (pt said still watching TV  will call RT when ready for cpap)  Mask Type Full face mask  Mask Size Medium

## 2022-09-13 NOTE — Progress Notes (Addendum)
[facility]    Date of Admission:  09/06/2022     ID: Linda Chase is a 67 y.o. 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 Regrets receiving lasix this morning, having to urinate often  Medications:   amiodarone  400 mg Oral BID   atorvastatin  20 mg Oral Daily   cholecalciferol  2,000 Units Oral Daily   furosemide  80 mg Intravenous BID   gabapentin  300 mg Oral QHS   insulin aspart  0-15 Units Subcutaneous TID WC   insulin aspart  0-5 Units Subcutaneous QHS   metoprolol succinate  100 mg Oral Daily   polyethylene glycol  17 g Oral Daily   revefenacin  175 mcg Nebulization Daily   sodium chloride flush  3 mL Intravenous Q12H    Objective: Vital signs in last 24 hours: Temp:  [97.2 F (36.2 C)-98.3 F (36.8 C)] 97.2 F (36.2 C) (09/02 1122) Pulse Rate:  [84-99] 84 (09/02 1122) Resp:  [15-26] 23 (09/02 1122) BP: (96-107)/(65-84) 101/81 (09/02 1122) SpO2:  [93 %-97 %] 97 % (09/02 1122) Weight:  [135.9 kg] 135.9 kg (09/02 0517)  Physical Exam  Constitutional:  oriented to person, place, and time. appears well-developed and well-nourished.  Sitting up in chair in no distress HENT: [place]/AT, PERRLA, no scleral icterus Mouth/Throat: Oropharynx is clear and moist. No oropharyngeal exudate.  Cardiovascular:  Normal rate, regular rhythm and normal heart sounds. Exam reveals no gallop and no friction rub.  No murmur heard.  Pulmonary/Chest: Effort normal and breath sounds normal. No respiratory distress.  has no wheezes.  Neck = supple, no nuchal rigidity Abdominal: Soft. Bowel sounds are normal.  exhibits no distension. There is no tenderness.  Lymphadenopathy: no cervical adenopathy. No axillary adenopathy Neurological: alert and oriented to person, place, and time.  Skin: Skin is warm and dry. No rash noted. No erythema.  Psychiatric: a normal mood and affect.  behavior is normal.    Lab Results Recent Labs    09/12/22 0234 09/13/22 0203  WBC 12.0* 12.2*  HGB 10.4* 10.4*  HCT 36.4 36.1  NA 136 135  K 3.8 3.7  CL 90* 90*  CO2 35* 37*  BUN 11 13  CREATININE 0.82 0.94   Liver Panel Recent Labs    09/12/22 0234 09/13/22 0203  PROT 7.7 8.1  ALBUMIN 2.7* 2.8*  AST 21 21  ALT 17 18  ALKPHOS 58 66  BILITOT 0.5 0.6   Lab Results  Component Value Date   ESRSEDRATE 57 (H) 09/10/2022   Lab Results  Component Value Date   CRP 6.5 (H) 09/10/2022    Microbiology: ---- Studies/Results: No results found.   Assessment/Plan:  Plan to start with empiric regimen of  for probable thoracic discitis/osteomyelitis   Will start vancomycin plus ceftriaxone 2gm IV daily after she undergoes CT guided  aspiration on Wednesday. Also Plan to get picc line for course of 6 wk of iv abtx then will follow with repeat imaging  Leukocytosis = anticipate to improve once starting abtx and improvement with back pain  [facility] Pager: [phone]  09/13/2022, 11:43 AM          Duke Salvia. Drue Second MD MPH [facility] [phone]

## 2022-09-13 NOTE — Progress Notes (Signed)
Progress Note   Patient: Linda Chase ZOX:[id number] DOB: 11-07-1955 DOA: 09/06/2022     7 DOS: the patient was seen and examined on 09/13/2022 at 8:18 AM      Brief hospital course: Mrs. Henley is a 67 y.o. F with MO, OSA not on CPAP, pAF on Eliquis, COPD, DM, HTN, hx cerebral aneurysm s/p clipping in 2006 in [place] who presented with over a month of progressive mid back pain, now severe.     Significant events: 8/26: Found to have Afib rate 130s, pH 7.2, pCO2 80, admitted on BiPAP and dilt drip; NSGY consulted for back pain 8/28: CT chest showed T8-9 endplate changes 8/29: Cardiology consulted for Afib 8/30: Unable to obtain MRI due to aneurysm clips of unknown make; ID consulted; amiodarone started 8/31: IR consulted for aspiration 9/1: Amiodarone switched to PO    Significant studies: 8/26 CT abdomen and pelvis with contrast: body wall edema, lung nodule, cardiomegaly 8/26 CT L spine: nonspecific degenerative disease 8/27 Echo: EF 50%, global hypokinesis, signs of increased RVP 8/28 CT chest: T8-9 end plate changes; lung nodules resolved; mediastinal adenopathy noted; repeat CT chest in 3 months recommended 8/30 CT head: NAICP, clips noted   Significant microbiology data: 8/29 BCx x2: NGTD 8/30 COVID: negative    Procedures:    Consults: Neurosurgery Cardiology ID IR      Assessment and Plan: * Acute on chronic respiratory failure with hypercapnia (HCC) Obesity hypoventilation syndrome Obstructive sleep apnea Acute on chronic Cor Pulmonale After admission, patient developed somnolence and respiratory acidosis, requiring BiPAP.    Treated and mentation improved, repeat ABG showed resolved pH.  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  Initial work up unremarkable, then CT chest on 8/28 in the setting of worsening respiratory status incidentally showed  T8-9 endplate changes  Unable to get MRI due to aneurysm clips of unknown MR compatibility.  IR consulted - Request records from [facility] for 2006 clipping, this is pending - Hold Abx until aspirate can be obtained - Aspirate pending Eliquis washout, planned for tomorrow - Agree with Cardiology, TEE would not change management - Consult ID, appreciate expertise    Paroxysmal atrial fibrillation with RVR (HCC) Rates controlled - Continue amiodarone - Continue metoprolol - Hold Eliquis for vertebral aspiration, resume after  OSA (obstructive sleep apnea) - BiPAP QHS  Acute on chronic heart failure with preserved ejection fraction (HFpEF) (HCC) Acute on chronic cor pulmonale Pulmonary hypertension Cardiology are continuing IV diuresis Net negative 1.9L yesterday, 10.3L on admission, Cr stable - Continue IV Lasix per Cardiology - Supplement K - Strict ins and outs - Daily weights - Consult cardiology, appreciate recommendations - Supplemental O2 during the day, BiPAP at night  Coronary artery disease involving native coronary artery of native heart without angina pectoris - Continue atorvastatin, metoprolol  Obesity, Class III, BMI 40-49.9 (morbid obesity) (HCC) BMI 53  COPD (chronic obstructive pulmonary disease) (HCC) Seems stable, not the main driver of her respiratory failure - Continue LAMA  Hypertension BP soft - Continue metoprolol, furosemide - Hold HCTZ, amlodipine  Diabetes mellitus (HCC) Glucose elevated today - Continue sliding scale corrections - Continue gabapentin for diabetic related neuropathy - Hold home metformin and Januvia while in the hospital          Subjective: Patient has no complaints.  She did not sleep well last night.  No fever.  Back pain is well-controlled with opiates.     Physical Exam: BP 101/81 (BP Location: Right Wrist)   Pulse 84   Temp (!) 97.2 F (36.2 C) (Oral)   Resp (!) 23   Ht 5\' 4"   (1.626 m)   Wt 135.9 kg   SpO2 97%   BMI 51.43 kg/m   Obese adult female, sitting in recliner, somnolent but responds to questions Irregular, controlled rate, JVP not visible, firm brawny venous changes to both legs Abdomen without tenderness palpation Respiratory effort seems normal, does not seem out of breath, lung sounds severely diminished Affect flat, mild psychomotor slowing noted, somnolence at this time of my exam, limited engagement, however suspect this is her baseline, oriented x 3    Data Reviewed: Discussed with cardiology and infectious disease Glucose is elevated CBC shows mild leukocytosis Patient metabolic panel unremarkable  Family Communication: None    Disposition: Status is: Inpatient Patient was admitted with back pain, found to have CT findings concerning for vertebral osteomyelitis  Awaiting aspiration, likely tomorrow, then she will need PICC line, OPAT  Once she has aspiration, PICC line, and plan for antibiotics, and once cardiology done with IV diuresis, she will be ready for discharge to home, likely 2-3 more days        Author: Alberteen Sam, MD 09/13/2022 12:37 PM  For on call review [email or web address].

## 2022-09-14 ENCOUNTER — Inpatient Hospital Stay (HOSPITAL_COMMUNITY): Payer: Medicare HMO

## 2022-09-14 DIAGNOSIS — D649 Anemia, unspecified: Secondary | ICD-10-CM | POA: Diagnosis not present

## 2022-09-14 DIAGNOSIS — I509 Heart failure, unspecified: Secondary | ICD-10-CM

## 2022-09-14 DIAGNOSIS — I4819 Other persistent atrial fibrillation: Secondary | ICD-10-CM | POA: Diagnosis not present

## 2022-09-14 DIAGNOSIS — J9602 Acute respiratory failure with hypercapnia: Secondary | ICD-10-CM | POA: Diagnosis not present

## 2022-09-14 HISTORY — PX: IR THORACIC DISC ASPIRATION W/IMG GUIDE: IMG943

## 2022-09-14 LAB — COMPREHENSIVE METABOLIC PANEL
ALT: 15 U/L (ref 0–44)
AST: 19 U/L (ref 15–41)
Albumin: 2.6 g/dL — ABNORMAL LOW (ref 3.5–5.0)
Alkaline Phosphatase: 54 U/L (ref 38–126)
Anion gap: 9 (ref 5–15)
BUN: 14 mg/dL (ref 8–23)
CO2: 37 mmol/L — ABNORMAL HIGH (ref 22–32)
Calcium: 8.9 mg/dL (ref 8.9–10.3)
Chloride: 91 mmol/L — ABNORMAL LOW (ref 98–111)
Creatinine, Ser: 1.03 mg/dL — ABNORMAL HIGH (ref 0.44–1.00)
GFR, Estimated: 60 mL/min — ABNORMAL LOW (ref >60)
Glucose, Bld: 212 mg/dL — ABNORMAL HIGH (ref 70–99)
Potassium: 3.9 mmol/L (ref 3.5–5.1)
Sodium: 137 mmol/L (ref 135–145)
Total Bilirubin: 0.5 mg/dL (ref 0.3–1.2)
Total Protein: 7.5 g/dL (ref 6.5–8.1)

## 2022-09-14 LAB — GLUCOSE, CAPILLARY
Glucose-Capillary: 165 mg/dL — ABNORMAL HIGH (ref 70–99)
Glucose-Capillary: 129 mg/dL — ABNORMAL HIGH (ref 70–99)
Glucose-Capillary: 185 mg/dL — ABNORMAL HIGH (ref 70–99)
Glucose-Capillary: 233 mg/dL — ABNORMAL HIGH (ref 70–99)

## 2022-09-14 LAB — CBC
HCT: 34.5 % — ABNORMAL LOW (ref 36.0–46.0)
Hemoglobin: 10 g/dL — ABNORMAL LOW (ref 12.0–15.0)
MCH: 24.8 pg — ABNORMAL LOW (ref 26.0–34.0)
MCHC: 29 g/dL — ABNORMAL LOW (ref 30.0–36.0)
MCV: 85.6 fL (ref 80.0–100.0)
Platelets: 353 10*3/uL (ref 150–400)
RBC: 4.03 MIL/uL (ref 3.87–5.11)
RDW: 14.6 % (ref 11.5–15.5)
WBC: 11.7 10*3/uL — ABNORMAL HIGH (ref 4.0–10.5)
nRBC: 0 % (ref 0.0–0.2)

## 2022-09-14 LAB — CULTURE, BLOOD (ROUTINE X 2)
Culture: NO GROWTH
Culture: NO GROWTH
Special Requests: ADEQUATE
Special Requests: ADEQUATE

## 2022-09-14 MED ORDER — INFLUENZA VAC A&B SURF ANT ADJ 0.5 ML IM SUSY
0.5000 mL | PREFILLED_SYRINGE | INTRAMUSCULAR | Status: DC
  Filled 2022-09-14: qty 0.5

## 2022-09-14 MED ORDER — FENTANYL CITRATE (PF) 100 MCG/2ML IJ SOLN
INTRAMUSCULAR | Status: AC
  Filled 2022-09-14: qty 2

## 2022-09-14 MED ORDER — MIDAZOLAM HCL 2 MG/2ML IJ SOLN
INTRAMUSCULAR | Status: AC
  Filled 2022-09-14: qty 2

## 2022-09-14 MED ORDER — LIDOCAINE HCL (PF) 1 % IJ SOLN
INTRAMUSCULAR | Status: AC
  Filled 2022-09-14: qty 30

## 2022-09-14 MED ORDER — BISACODYL 10 MG RE SUPP
10.0000 mg | Freq: Once | RECTAL | Status: DC
  Filled 2022-09-14: qty 1

## 2022-09-14 MED ORDER — BUPIVACAINE HCL (PF) 0.5 % IJ SOLN
INTRAMUSCULAR | Status: AC
  Filled 2022-09-14: qty 30

## 2022-09-14 NOTE — Plan of Care (Signed)

## 2022-09-14 NOTE — Progress Notes (Signed)
Patient declined bedtime BiPap at this time since she is still awake.  She said that she would like to wait until 3am.

## 2022-09-14 NOTE — Progress Notes (Signed)
Physical Therapy Treatment Patient Details Name: Linda Chase MRN: [id number] DOB: 29-Aug-1955 Today's Date: 09/14/2022   History of Present Illness Linda Chase is a 67 y.o. female who presented to [facility] ED with complaints of lower back pain however due to respiratory difficulty placed on biPap and then transferred to [facility]. HR also in 130s in a-fib. PMH: HTN, paroxysmal a-fib on chronic anticoagulation, DM II, cerebral aneurysm, and morbid obesity    PT Comments  Pt received standing at the sink performing hygiene and agreeable to session. Pt continues to be limited by impaired activity tolerance and requires rest breaks during mobility tasks. Pt able to tolerate increased gait distance with multiple standing rest breaks and SpO2 WFL on 2L throughout. Pt able to stand x5 with single UE support and CGA for safety. Pt continues to benefit from PT services to progress toward functional mobility goals.    If plan is discharge home, recommend the following: A lot of help with walking and/or transfers;Help with stairs or ramp for entrance;Assist for transportation;Assistance with cooking/housework;A lot of help with bathing/dressing/bathroom   Can travel by private vehicle     No  Equipment Recommendations  Rolling walker (2 wheels)    Recommendations for Other Services       Precautions / Restrictions Precautions Precautions: Fall Precaution Comments: watch SpO2 and HR Restrictions Weight Bearing Restrictions: No     Mobility  Bed Mobility               General bed mobility comments: Pt OOB upon arrival    Transfers Overall transfer level: Needs assistance Equipment used: Rolling walker (2 wheels) Transfers: Sit to/from Stand Sit to Stand: Contact guard assist           General transfer comment: STS from recliner x7 with x5 reps with single UE support. CGA for safety    Ambulation/Gait Ambulation/Gait assistance: Contact guard assist Gait  Distance (Feet): 180 Feet Assistive device: Rolling walker (2 wheels) Gait Pattern/deviations: Step-through pattern, Decreased stride length, Trunk flexed Gait velocity: dec     General Gait Details: Pt demonstrating short step-through pattern with flexed trunk despite cues. Pt able to improve upright posture during standing rest breaks. Pt performing 44ft with no AD per pt request with no LOB, however pt demonstrating increased instability and lateral leaning for BLE advancement. Pt with improved stability with RW support      Balance Overall balance assessment: Needs assistance Sitting-balance support: Feet supported, No upper extremity supported Sitting balance-Leahy Scale: Good Sitting balance - Comments: sitting in recliner   Standing balance support: Bilateral upper extremity supported, Single extremity supported, During functional activity Standing balance-Leahy Scale: Fair Standing balance comment: with RW support. Pt able to stand at sink to perform hygiene with single UE support without LOB                            Cognition Arousal: Alert Behavior During Therapy: WFL for tasks assessed/performed Overall Cognitive Status: No family/caregiver present to determine baseline cognitive functioning                           Safety/Judgement: Decreased awareness of safety, Decreased awareness of deficits              Exercises      General Comments General comments (skin integrity, edema, etc.): VSS on 2L O2      Pertinent Vitals/Pain  Pain Assessment Pain Assessment: No/denies pain     PT Goals (current goals can now be found in the care plan section) Acute Rehab PT Goals Patient Stated Goal: home asap PT Goal Formulation: With patient Time For Goal Achievement: 09/21/22 Potential to Achieve Goals: Good Progress towards PT goals: Progressing toward goals    Frequency    Min 1X/week      PT Plan         AM-PAC PT "6 Clicks"  Mobility   Outcome Measure  Help needed turning from your back to your side while in a flat bed without using bedrails?: A Lot Help needed moving from lying on your back to sitting on the side of a flat bed without using bedrails?: A Lot Help needed moving to and from a bed to a chair (including a wheelchair)?: A Little Help needed standing up from a chair using your arms (e.g., wheelchair or bedside chair)?: A Little Help needed to walk in hospital room?: A Little Help needed climbing 3-5 steps with a railing? : Total 6 Click Score: 14    End of Session Equipment Utilized During Treatment: Gait belt;Oxygen Activity Tolerance: Patient tolerated treatment well Patient left: in chair;with call bell/phone within reach Nurse Communication: Mobility status PT Visit Diagnosis: Unsteadiness on feet (R26.81);Other abnormalities of gait and mobility (R26.89);Pain     Time: 1311-1340 PT Time Calculation (min) (ACUTE ONLY): 29 min  Charges:    $Gait Training: 8-22 mins $Therapeutic Activity: 8-22 mins PT General Charges $$ ACUTE PT VISIT: 1 Visit                     Johny Shock, PTA Acute Rehabilitation Services Secure Chat Preferred  [phone]    Johny Shock 09/14/2022, 1:59 PM

## 2022-09-14 NOTE — Progress Notes (Signed)
Mobility Specialist Progress Note:   09/14/22 1612  Mobility  Activity Ambulated with assistance in hallway  Level of Assistance Contact guard assist, steadying assist  Assistive Device Front wheel walker  Distance Ambulated (ft) 200 ft  Activity Response Tolerated well  Mobility Referral Yes  $Mobility charge 1 Mobility  Mobility Specialist Start Time (ACUTE ONLY) 1550  Mobility Specialist Stop Time (ACUTE ONLY) 1605  Mobility Specialist Time Calculation (min) (ACUTE ONLY) 15 min   Pre Mobility: 90 HR  During Mobility: 103 HR  Post Mobility: 94 HR , 95% SpO2 2 L   Pt received in chair, agreeable to mobility. Unable to get accurate SpO2 reading during ambulation. Asymptomatic on 2 L. Verbal cues required to stay within walker and to keep upright posture during ambulation. Pt returned to chair with call bell in hand and all needs met.   Leory Plowman  Mobility Specialist Please contact via [employer] office at [phone]

## 2022-09-14 NOTE — Progress Notes (Signed)
Patient was brought to IR suite for requested T8-T9 disc aspiration. Attempt was made to position patient prone for procedure, but patient was unable to tolerate positioning required for procedure. Unfortunately, procedure unable to be performed and was subsequently cancelled as it is not expected that patient would be able to tolerate positioning required for future attempt. Please contact NIR team with any questions or concerns.  Kennieth Francois, PA-C 09/14/2022

## 2022-09-14 NOTE — Progress Notes (Signed)
Occupational Therapy Treatment Patient Details Name: Linda Chase MRN: [id number] DOB: [birth year] Today's Date: 09/14/2022   History of present illness Linda Chase is a 67 y.o. female who presented to [facility] ED with complaints of lower back pain however due to respiratory difficulty placed on biPap and then transferred to [facility]. HR also in 130s in a-fib. PMH: HTN, paroxysmal a-fib on chronic anticoagulation, DM II, cerebral aneurysm, and morbid obesity   OT comments  Pt progressing towards goals this session, overall needing mod A for LB ADL, min A for step pivot transfer to [facility] with use of RW. Min A for bed mobility, SpO2 down to high 80's on 2L O2 when attempting to wean down, left on 3L O2 at end of session. Pt presenting with impairments listed below, will follow acutely. Patient will benefit from continued inpatient follow up therapy, <3 hours/day to maximize safety/ind with ADLs/functional mobility.       If plan is discharge home, recommend the following:  A little help with walking and/or transfers;A lot of help with bathing/dressing/bathroom;Direct supervision/assist for medications management;Help with stairs or ramp for entrance;Assist for transportation;Direct supervision/assist for financial management;Assistance with cooking/housework   Equipment Recommendations  Other (comment) (defer)    Recommendations for Other Services      Precautions / Restrictions Precautions Precautions: Fall Precaution Comments: watch SpO2 and HR Restrictions Weight Bearing Restrictions: No       Mobility Bed Mobility Overal bed mobility: Needs Assistance Bed Mobility: Sit to Supine     Supine to sit: Min assist, HOB elevated, Used rails     General bed mobility comments: assist to return BLE into bed    Transfers Overall transfer level: Needs assistance Equipment used: Rolling walker (2 wheels) Transfers: Sit to/from Stand, Bed to chair/wheelchair/BSC Sit  to Stand: Min assist     Step pivot transfers: Min assist           Balance Overall balance assessment: Needs assistance Sitting-balance support: Feet supported, No upper extremity supported Sitting balance-Leahy Scale: Good Sitting balance - Comments: leaning and reaching while on very edge of BSC to wipe after toileting   Standing balance support: Bilateral upper extremity supported Standing balance-Leahy Scale: Poor Standing balance comment: reliant on RW support                           ADL either performed or assessed with clinical judgement   ADL                       Lower Body Dressing: Moderate assistance;Sitting/lateral leans Lower Body Dressing Details (indicate cue type and reason): reaches down to pull up socks Toilet Transfer: Minimal assistance;Stand-pivot;Rolling walker (2 wheels);BSC/3in1 Toilet Transfer Details (indicate cue type and reason): urgent due to need to urinate         Functional mobility during ADLs: Minimal assistance;Rolling walker (2 wheels)      Extremity/Trunk Assessment Upper Extremity Assessment Upper Extremity Assessment: Generalized weakness   Lower Extremity Assessment Lower Extremity Assessment: Defer to PT evaluation        Vision   Vision Assessment?: No apparent visual deficits   Perception Perception Perception: Not tested   Praxis Praxis Praxis: Not tested    Cognition Arousal: Alert   Overall Cognitive Status: No family/caregiver present to determine baseline cognitive functioning Area of Impairment: Following commands, Safety/judgement, Awareness, Problem solving  Following Commands: Follows one step commands consistently, Follows multi-step commands with increased time Safety/Judgement: Decreased awareness of safety Awareness: Emergent Problem Solving: Slow processing, Difficulty sequencing, Requires verbal cues, Requires tactile cues           Exercises      Shoulder Instructions       General Comments VSS on 3L O2    Pertinent Vitals/ Pain       Pain Assessment Pain Assessment: No/denies pain  Home Living                                          Prior Functioning/Environment              Frequency  Min 1X/week        Progress Toward Goals  OT Goals(current goals can now be found in the care plan section)  Progress towards OT goals: Progressing toward goals  Acute Rehab OT Goals Patient Stated Goal: none stated OT Goal Formulation: With patient Time For Goal Achievement: 09/21/22 Potential to Achieve Goals: Fair ADL Goals Pt Will Perform Grooming: with set-up;standing Pt Will Perform Lower Body Bathing: with supervision;with adaptive equipment;sit to/from stand Pt Will Perform Lower Body Dressing: with supervision;sit to/from stand;with adaptive equipment Pt Will Transfer to Toilet: with supervision;bedside commode;ambulating Pt Will Perform Toileting - Clothing Manipulation and hygiene: with supervision;with adaptive equipment;sit to/from stand Pt Will Perform Tub/Shower Transfer: with min assist;tub bench;Tub transfer Additional ADL Goal #1: Pt will independently verbalize at least two energy conservation strategies for selfcare tasks.  Plan      Co-evaluation                 AM-PAC OT "6 Clicks" Daily Activity     Outcome Measure   Help from another person eating meals?: None Help from another person taking care of personal grooming?: A Little Help from another person toileting, which includes using toliet, bedpan, or urinal?: A Lot Help from another person bathing (including washing, rinsing, drying)?: A Lot Help from another person to put on and taking off regular upper body clothing?: A Little Help from another person to put on and taking off regular lower body clothing?: A Lot 6 Click Score: 16    End of Session Equipment Utilized During Treatment: Rolling  walker (2 wheels)  OT Visit Diagnosis: Unsteadiness on feet (R26.81);Other abnormalities of gait and mobility (R26.89);Muscle weakness (generalized) (M62.81);Pain;Other symptoms and signs involving cognitive function   Activity Tolerance Other (comment) (limited due to frequent need to urinate, given lasix prior to session)   Patient Left in bed;with call bell/phone within reach;with bed alarm set   Nurse Communication Mobility status (HR varying during session (vtach/bradycardia) pt asymptomatic)        Time: 1610-9604 OT Time Calculation (min): 26 min  Charges: OT General Charges $OT Visit: 1 Visit OT Treatments $Self Care/Home Management : 23-37 mins  Carver Fila, OTD, OTR/L SecureChat Preferred Acute Rehab [phone]   Burgess Sheriff K Koonce 09/14/2022, 10:29 AM

## 2022-09-14 NOTE — Progress Notes (Signed)
PROGRESS NOTE    Linda Chase  BMW:[id number] DOB: 03/26/1955 DOA: 09/06/2022 PCP: Rometta Emery, MD   Brief Narrative:  [name] is a 67 y.o. 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  Also was in A-fib, cardiology on board.  ID and neurosurgery also saw patient.  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)   Paroxysmal atrial fibrillation with RVR (HCC)   Diabetes mellitus (HCC)   Hypertension   COPD (chronic obstructive pulmonary disease) (HCC)   Obesity, Class III, BMI 40-49.9 (morbid obesity) (HCC)   Coronary artery disease involving native coronary artery of native heart without angina pectoris   Acute on chronic heart failure with preserved ejection fraction (HFpEF) (HCC)   OSA (obstructive sleep apnea)   Acute on chronic Cor pulmonale (HCC)   Obesity hypoventilation syndrome (HCC)  Acute on chronic respiratory failure with hypercapnia (HCC) Obesity hypoventilation syndrome Obstructive sleep apnea Acute on chronic Cor Pulmonale After admission, patient developed somnolence and respiratory acidosis, requiring BiPAP.  Currently on 2 L of oxygen saturating 95%.  Doing well.   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  Initial work up unremarkable, then CT chest on 8/28 in the setting of worsening respiratory status incidentally showed T8-9 endplate changes with possible discitis/osteomyelitis.  Unable to get MRI due to aneurysm clips of unknown MR compatibility.  IR consulted - Request records  from [facility] for 2006 clipping, this is pending - Hold Abx until aspirate can be obtained, she is scheduled for aspiration of the disc today.  Eliquis on hold. Per cardiology, TEE would not change management Appreciate ID consultation.  Plan to start antibiotics after aspiration.   Paroxysmal atrial fibrillation with RVR (HCC) Initially started on Cardizem drip but due to hypotension, she was transitioned to amiodarone IV and then oral amiodarone.  Rates controlled.  Cardiology managing.     OSA (obstructive sleep apnea) - BiPAP QHS   Acute on chronic heart failure with preserved ejection fraction (HFpEF) (HCC) Acute on chronic cor pulmonale Pulmonary hypertension Cardiology are continuing IV diuresis Net negative 11.1 L since admission.  Strict I's and O's and low-sodium diet and BiPAP at night.   Coronary artery disease involving native coronary artery of native heart without angina pectoris - Continue atorvastatin, metoprolol   Obesity, Class III, BMI 40-49.9 (morbid obesity) (HCC) BMI 53, weight loss and dietary modification counseled.   COPD (chronic obstructive pulmonary disease) (HCC) Seems stable, not the main driver of her respiratory failure - Continue LAMA   Hypertension BP soft - Continue Toprol-XL, furosemide - Hold HCTZ, amlodipine   Diabetes mellitus (HCC): - Continue sliding scale corrections - Continue gabapentin for diabetic related neuropathy - Hold home metformin and Januvia while in the hospital  DVT prophylaxis: Her Eliquis has been on hold for few days, IV started on SCD today.  Eliquis to be resumed once cleared by IR.   Code Status: Full Code  Family Communication:  None present at bedside.  Plan of care discussed with patient in length and he/she verbalized understanding  and agreed with it.  Status is: Inpatient Remains inpatient appropriate because: Scheduled for aspiration of the T8-T9 vertebrae.   Estimated body mass  index is 51.69 kg/m as calculated from the following:   Height as of this encounter: 5\' 4"  (1.626 m).   Weight as of this encounter: 136.6 kg.    Nutritional Assessment: Body mass index is 51.69 kg/m.Marland Kitchen Seen by dietician.  I agree with the assessment and plan as outlined below: Nutrition Status:        . Skin Assessment: I have examined the patient's skin and I agree with the wound assessment as performed by the wound care RN as outlined below:    Consultants:  Cardiology, neurosurgery, ID  Procedures:  As above  Antimicrobials:  Anti-infectives (From admission, onward)    Start     Dose/Rate Route Frequency Ordered Stop   09/13/22 1045  cefTRIAXone (ROCEPHIN) 2 g in sodium chloride 0.9 % 100 mL IVPB  Status:  Discontinued        2 g 200 mL/hr over 30 Minutes Intravenous Every 24 hours 09/13/22 0952 09/13/22 1117         Subjective: Patient seen and examined.  She has no new complaint.  No chest pain or shortness of breath.  Back pain is improving.  Objective: Vitals:   09/13/22 2216 09/13/22 2345 09/14/22 0636 09/14/22 0754  BP:  100/66 107/63 108/77  Pulse: 94 92 95 91  Resp: (!) 22 20 (!) 21 (!) 22  Temp:  98 F (36.7 C) 98.1 F (36.7 C) 98.2 F (36.8 C)  TempSrc:  Oral Oral Oral  SpO2: 99% 99% 93% 95%  Weight:   (!) 136.6 kg   Height:        Intake/Output Summary (Last 24 hours) at 09/14/2022 1020 Last data filed at 09/13/2022 2200 Gross per 24 hour  Intake 480 ml  Output 1852 ml  Net -1372 ml   Filed Weights   09/12/22 0500 09/13/22 0517 09/14/22 0636  Weight: (!) 136.1 kg 135.9 kg (!) 136.6 kg    Examination:  General exam: Appears calm and comfortable, sitting in the chair,, morbidly obese Respiratory system: Crackles at the bases bilaterally. Respiratory effort normal. Cardiovascular system: S1 & S2 heard, RRR. No JVD, murmurs, rubs, gallops or clicks.  +2-3 pitting edema bilateral lower extremity. Gastrointestinal system: Abdomen is  nondistended, soft and nontender. No organomegaly or masses felt. Normal bowel sounds heard. Central nervous system: Alert and oriented. No focal neurological deficits. Extremities: Symmetric 5 x 5 power. Skin: No rashes, lesions or ulcers Psychiatry: Judgement and insight appear normal. Mood & affect appropriate.    Data Reviewed: I have personally reviewed following labs and imaging studies  CBC: Recent Labs  Lab 09/08/22 0338 09/09/22 1048 09/10/22 0855 09/11/22 0245 09/12/22 0234 09/13/22 0203 09/14/22 0229  WBC 11.7* 11.9* 9.8 11.4* 12.0* 12.2* 11.7*  NEUTROABS 8.8* 10.3*  --   --   --   --   --   HGB 10.7* 10.2* 10.0* 10.0* 10.4* 10.4* 10.0*  HCT 37.8 36.3 35.0* 35.0* 36.4 36.1 34.5*  MCV 87.5 85.4 84.7 84.5 84.3 86.0 85.6  PLT 277 307 301 242 298 350 353   Basic Metabolic Panel: Recent Labs  Lab 09/10/22 0855 09/11/22 0245 09/12/22 0234 09/13/22 0203 09/14/22 0229  NA 135 136 136 135 137  K 3.9 3.8 3.8 3.7 3.9  CL 92* 90* 90* 90* 91*  CO2 35* 37* 35* 37* 37*  GLUCOSE 144* 151* 166*  186* 212*  BUN 8 11 11 13 14   CREATININE 0.84 1.02* 0.82 0.94 1.03*  CALCIUM 8.6* 8.5* 8.8* 8.7* 8.9   GFR: Estimated Creatinine Clearance: 74.2 mL/min (A) (by C-G formula based on SCr of 1.03 mg/dL (H)). Liver Function Tests: Recent Labs  Lab 09/10/22 0855 09/11/22 0245 09/12/22 0234 09/13/22 0203 09/14/22 0229  AST 19 21 21 21 19   ALT 14 15 17 18 15   ALKPHOS 53 57 58 66 54  BILITOT 0.7 0.5 0.5 0.6 0.5  PROT 7.5 7.0 7.7 8.1 7.5  ALBUMIN 2.6* 2.5* 2.7* 2.8* 2.6*   No results for input(s): "LIPASE", "AMYLASE" in the last 168 hours. No results for input(s): "AMMONIA" in the last 168 hours. Coagulation Profile: No results for input(s): "INR", "PROTIME" in the last 168 hours. Cardiac Enzymes: No results for input(s): "CKTOTAL", "CKMB", "CKMBINDEX", "TROPONINI" in the last 168 hours. BNP (last 3 results) No results for input(s): "PROBNP" in the last 8760  hours. HbA1C: No results for input(s): "HGBA1C" in the last 72 hours. CBG: Recent Labs  Lab 09/13/22 0606 09/13/22 1120 09/13/22 1614 09/13/22 2139 09/14/22 0641  GLUCAP 215* 151* 179* 238* 165*   Lipid Profile: No results for input(s): "CHOL", "HDL", "LDLCALC", "TRIG", "CHOLHDL", "LDLDIRECT" in the last 72 hours. Thyroid Function Tests: No results for input(s): "TSH", "T4TOTAL", "FREET4", "T3FREE", "THYROIDAB" in the last 72 hours. Anemia Panel: No results for input(s): "VITAMINB12", "FOLATE", "FERRITIN", "TIBC", "IRON", "RETICCTPCT" in the last 72 hours. Sepsis Labs: Recent Labs  Lab 09/09/22 1048  PROCALCITON <0.10    Recent Results (from the past 240 hour(s))  Culture, blood (Routine X 2) w Reflex to ID Panel     Status: None   Collection Time: 09/09/22 10:48 AM   Specimen: BLOOD LEFT HAND  Result Value Ref Range Status   Specimen Description BLOOD LEFT HAND  Final   Special Requests   Final    BOTTLES DRAWN AEROBIC AND ANAEROBIC Blood Culture adequate volume   Culture   Final    NO GROWTH 5 DAYS Performed at [facility], [address] [place], [place] [address]    Report Status 09/14/2022 FINAL  Final  Culture, blood (Routine X 2) w Reflex to ID Panel     Status: None   Collection Time: 09/09/22 10:51 AM   Specimen: BLOOD LEFT HAND  Result Value Ref Range Status   Specimen Description BLOOD LEFT HAND  Final   Special Requests   Final    BOTTLES DRAWN AEROBIC AND ANAEROBIC Blood Culture adequate volume   Culture   Final    NO GROWTH 5 DAYS Performed at [facility], [address] [place], [place] [address]    Report Status 09/14/2022 FINAL  Final  SARS Coronavirus 2 by RT PCR (hospital order, performed in [facility] hospital lab) *cepheid single result test* Anterior Nasal Swab     Status: None   Collection Time: 09/10/22  8:45 AM   Specimen: Anterior Nasal Swab  Result Value Ref Range Status   SARS Coronavirus 2 by RT PCR NEGATIVE NEGATIVE  Final    Comment: Performed at [facility], [address], [place], [place] [address]     Radiology Studies: No results found.  Scheduled Meds:  amiodarone  400 mg Oral BID   atorvastatin  20 mg Oral Daily   cholecalciferol  2,000 Units Oral Daily   furosemide  80 mg Intravenous BID   gabapentin  300 mg Oral QHS  insulin aspart  0-15 Units Subcutaneous TID WC   insulin aspart  0-5 Units Subcutaneous QHS   metoprolol succinate  100 mg Oral Daily   polyethylene glycol  17 g Oral Daily   revefenacin  175 mcg Nebulization Daily   sodium chloride flush  3 mL Intravenous Q12H   Continuous Infusions:   LOS: 8 days   Hughie Closs, MD [facility]  09/14/2022, 10:20 AM   *Please note that this is a verbal dictation therefore any spelling or grammatical errors are due to the "Dragon Medical One" system interpretation.  Please page via Amion and do not message via secure chat for urgent patient care matters. Secure chat can be used for non urgent patient care matters.  How to contact the [facility] Attending or Consulting provider 7A - 7P or covering provider during after hours 7P -7A, for this patient?  Check the care team in [facility] and look for a) attending/consulting [facility] provider listed and b) the [facility] team listed. Page or secure chat 7A-7P. Log into [email or web address] and use [place]'s universal password to access. If you do not have the password, please contact the hospital operator. Locate the [facility] provider you are looking for under [facility] and page to a number that you can be directly reached. If you still have difficulty reaching the provider, please page the [facility] (Director on Call) for the Hospitalists listed on amion for assistance.

## 2022-09-14 NOTE — Progress Notes (Signed)
Patient Name: Linda Chase Date of Encounter: 09/14/2022 [facility] Cardiologist: Dietrich Pates, MD   Interval Summary  .   Sitting up on the side of the bed, getting ready for a walk  Interval Hx Crt stable Chronic metabolic alkalosis , CO2 37 Albumin 2.6 Planned for aspiration via IR with osteo Afib remains rate controlled  Vital Signs .    Vitals:   09/13/22 2216 09/13/22 2345 09/14/22 0636 09/14/22 0754  BP:  100/66 107/63 108/77  Pulse: 94 92 95 91  Resp: (!) 22 20 (!) 21 (!) 22  Temp:  98 F (36.7 C) 98.1 F (36.7 C) 98.2 F (36.8 C)  TempSrc:  Oral Oral Oral  SpO2: 99% 99% 93% 95%  Weight:   (!) 136.6 kg   Height:        Intake/Output Summary (Last 24 hours) at 09/14/2022 0843 Last data filed at 09/13/2022 2200 Gross per 24 hour  Intake 480 ml  Output 2752 ml  Net -2272 ml      09/14/2022    6:36 AM 09/13/2022    5:17 AM 09/12/2022    5:00 AM  Last 3 Weights  Weight (lbs) 301 lb 2.4 oz 299 lb 9.7 oz 300 lb 1.6 oz  Weight (kg) 136.6 kg 135.9 kg 136.124 kg      Telemetry/ECG    Rate controlled atrial fibrillation - Personally Reviewed  Physical Exam .   Was working with PT/limited GEN: No acute distress.  Super-morbid obesity Respiratory: nl wob GI: Soft, nontender, non-distended ; obese MS: BL leg edema  Assessment & Plan .     CHF: She has mostly is not entirely right heart failure due to chronic cor pulmonale (obesity hypoventilation syndrome).  Good response to diuretics without any deterioration in renal function.  Continue diuresis.  Not a good candidate for SGLT2 inhibitors. Persistent atrial fibrillation: Now with controlled ventricular response.  Did not tolerate conventional AV nodal blocking agents due to hypotension, now on amiodarone.  Can consider cardioversion after a minimum of 4 weeks of amiodarone therapy, but due to her chronic untreated obesity hypoventilation syndrome, long-term maintenance of sinus rhythm is unlikely to  be successful. Anticoagulation: No overt bleeding since this was started about 6 months ago.   Anemia: She has chronic anemia.  This would be mild for the average patient, but in view of her chronic respiratory problems she would be expected to be polycythemic so would categorize this as moderate anemia.  Normocytic, hypochromic with normal RBC.  There is likely to be a component of thalassemia. Obesity hypoventilation syndrome/obstructive sleep apnea/pulmonary artery hypertension/acute on chronic cor pulmonale: She has been refusing BiPAP.  She needs at least oxygen while awake and BiPAP while asleep. Has chronic metabolic alkalosis as compensation for respiratory acidosis  Moderate-severe tricuspid insufficiency: Secondary to pulmonary hypertension and RV dysfunction.  No evidence of vegetations on transthoracic echo. Thoracic vertebral osteomyelitis: Source uncertain.  Blood cultures are negative to date.  The possibility of endocarditis is considered, but no obvious vegetations are seen on the transthoracic echo and there is no evidence of significant left heart valvular disease.  The only meaningful valvular abnormality is tricuspid regurgitation, which is probably secondary to RV dilation and dysfunction.  Transesophageal echocardiography would be more sensitive for detection of endocarditis, but I do not think this would change her management since she will require protracted treatment with IV antibiotics for the osteomyelitis anyway.  TEE is also not a low risk procedure  due to the risk of sedation in this patient with severe hypoventilation.  Will consider TEE only if there is evidence of left heart valvular involvement on follow-up transthoracic echo. (Even then, I doubt that she would be a candidate for cardiac surgery). Coronary artery calcifications: She does not have angina pectoris.  On statin and metoprolol.  Not on aspirin due to full anticoagulation.  Can diurese as crt allows, than would  transition to oral diuretics. Otherwise, no anticipated changes from cardiology  For questions or updates, please contact  HeartCare Please consult [email or web address] for contact info under        Signed, Samanatha Brammer, Alben Spittle, MD

## 2022-09-14 NOTE — Consult Note (Signed)
Chief Complaint: Patient was seen in consultation today for suspected discitis  Referring Physician(s): Hughie Closs, MD  Supervising Physician: Baldemar Lenis  Patient Status: [facility] - In-pt  History of Present Illness: Linda Chase is a 67 y.o. female with PMH significant arthritis, atrial fibrillation, cerebral aneurysm x2, depression, diabetes mellitus, and hypertension being seen today in relation to suspected discitis/osteomyelitis. Patient was admitted to [facility] on 09/06/22 after being found to be in acute respiratory failure. CT on 09/06/22 noted findings of pulmonary nodules, which prompted follow-up CT Chest on 8/28 which revealed concern for discitis/osteomyelitis at T8-T9. MRI is unable to be obtained due to aneurysm clips of unknown MR compatibility. IR was subsequently consulted for image-guided T8-T9 disc aspiration.   Past Medical History:  Diagnosis Date   Arthritis    Cerebral aneurysm    x2   Depression    Diabetes mellitus    Hypertension     Past Surgical History:  Procedure Laterality Date   BRAIN SURGERY     double aneurysm    Allergies: Hydrocodone  Medications: Prior to Admission medications   Medication Sig Start Date End Date Taking? Authorizing Provider  albuterol (VENTOLIN HFA) 108 (90 Base) MCG/ACT inhaler Inhale 1-2 puffs into the lungs as needed for wheezing or shortness of breath. 03/18/22  Yes [provider]  amLODipine (NORVASC) 5 MG tablet Take 1 tablet (5 mg total) by mouth daily. Patient taking differently: Take 5 mg by mouth every evening. 02/05/12  Yes Tat, Onalee Hua, MD  apixaban (ELIQUIS) 5 MG TABS tablet Take 5 mg by mouth 2 (two) times daily.   Yes [provider]  atorvastatin (LIPITOR) 20 MG tablet Take 20 mg by mouth daily.   Yes [provider]  Calcium-Phosphorus-Vitamin D (CALCIUM/VITAMIN D3/ADULT GUMMY PO) GUMMY VITAMIN D 2000 06/15/17  Yes [provider]  cholecalciferol  (VITAMIN D) 1000 units tablet Take 2,000 Units by mouth daily. Gummies   Yes [provider]  furosemide (LASIX) 40 MG tablet Take 40 mg by mouth daily. 06/23/22  Yes [provider]  gabapentin (NEURONTIN) 300 MG capsule Take 300 mg by mouth at bedtime.   Yes [provider]  hydrochlorothiazide (HYDRODIURIL) 25 MG tablet Take 25 mg by mouth every morning. 07/31/15  Yes [provider]  JANUVIA 25 MG tablet Take 25 mg by mouth daily. 06/23/22  Yes [provider]  metFORMIN (GLUCOPHAGE) 1000 MG tablet Take 1,000 mg by mouth 2 (two) times daily with a meal.   Yes [provider]  metoprolol succinate (TOPROL-XL) 25 MG 24 hr tablet Take 25 mg by mouth 2 (two) times daily. 06/23/22  Yes [provider]  SPIRIVA HANDIHALER 18 MCG inhalation capsule Place 18 mcg into inhaler and inhale daily. 06/23/22  Yes [provider]  potassium chloride (KLOR-CON M) 10 MEQ tablet Take 10 mEq by mouth daily. Patient not taking: Reported on 09/06/2022 03/18/22   [provider]     Family History  Family history unknown: Yes    Social History   Socioeconomic History   Marital status: Legally Separated    Spouse name: Not on file   Number of children: Not on file   Years of education: Not on file   Highest education level: Not on file  Occupational History   Not on file  Tobacco Use   Smoking status: Some Days    Types: Cigarettes   Smokeless tobacco: Never  Vaping Use   Vaping status:  Never Used  Substance and Sexual Activity   Alcohol use: No   Drug use: No   Sexual activity: Never  Other Topics Concern   Not on file  Social History Narrative   Not on file   Social Determinants of Health   Financial Resource Strain: Not on file  Food Insecurity: No Food Insecurity (09/09/2022)   Hunger Vital Sign    Worried About Running Out of Food in the Last Year: Never true    Ran Out of Food in the Last Year: Never true   Transportation Needs: No Transportation Needs (09/09/2022)   PRAPARE - Administrator, Civil Service (Medical): No    Lack of Transportation (Non-Medical): No  Physical Activity: Not on file  Stress: Not on file  Social Connections: Not on file    Code Status: Full code  Review of Systems: A 12 point ROS discussed and pertinent positives are indicated in the HPI above.  All other systems are negative.  Review of Systems  Constitutional:  Negative for chills and fever.  Respiratory:  Positive for shortness of breath. Negative for chest tightness.   Cardiovascular:  Positive for leg swelling. Negative for chest pain.  Gastrointestinal:  Negative for abdominal pain, diarrhea, nausea and vomiting.  Neurological:  Negative for dizziness and headaches.  Psychiatric/Behavioral:  Negative for confusion.     Vital Signs: BP 108/77 (BP Location: Right Wrist)   Pulse 91   Temp 98.2 F (36.8 C) (Oral)   Resp (!) 22   Ht 5\' 4"  (1.626 m)   Wt (!) 301 lb 2.4 oz (136.6 kg)   SpO2 95%   BMI 51.69 kg/m    Physical Exam Vitals reviewed.  Constitutional:      General: She is not in acute distress.    Appearance: She is obese. She is ill-appearing.  HENT:     Mouth/Throat:     Mouth: Mucous membranes are moist.  Eyes:     Pupils: Pupils are equal, round, and reactive to light.  Cardiovascular:     Rate and Rhythm: Normal rate. Rhythm irregular.     Pulses: Normal pulses.     Heart sounds: Normal heart sounds.  Pulmonary:     Comments: Diminished breath sounds in all fields Abdominal:     Palpations: Abdomen is soft.     Tenderness: There is no abdominal tenderness.  Musculoskeletal:     Right lower leg: Edema present.     Left lower leg: Edema present.     Comments: Bilateral lower extremity edema  Skin:    General: Skin is warm and dry.  Neurological:     Mental Status: She is alert and oriented to person, place, and time.  Psychiatric:        Mood and Affect:  Mood normal.        Behavior: Behavior normal.        Thought Content: Thought content normal.        Judgment: Judgment normal.     Imaging: CT HEAD WO CONTRAST ( )  Result Date: 09/10/2022 CLINICAL DATA:  CT for MRI clearance. EXAM: CT HEAD WITHOUT CONTRAST TECHNIQUE: Contiguous axial images were obtained from the base of the skull through the vertex without intravenous contrast. RADIATION DOSE REDUCTION: This exam was performed according to the departmental dose-optimization program which includes automated exposure control, adjustment of the mA and/or kV according to patient size and/or use of iterative reconstruction technique. COMPARISON:  None Available. FINDINGS: Brain: Cystic  encephalomalacia in the right parieto-occipital region. Encephalomalacia in the anterior right temporal lobe. No evidence of acute large vascular territory infarct, acute hemorrhage, mass lesion, midline shift or hydrocephalus. Vascular: Aneurysm clips in the right paraclinoid region. Skull: Right-sided craniotomy.  No evidence of acute fracture. Sinuses/Orbits: No sub clear sinuses.  No acute orbital findings. Other: No mastoid effusions. IMPRESSION: 1. Positive for aneurysm clips. MRI is likely contraindicated, but recommend correlation with prior surgical note and exact type of clip if available. 2. No evidence of acute intracranial abnormality. 3. Encephalomalacia in the right parieto-occipital region and anterior right temporal lobe. Electronically Signed   By: Feliberto Harts M.D.   On: 09/10/2022 17:34   DG CHEST PORT 1 VIEW  Result Date: 09/09/2022 CLINICAL DATA:  CHF EXAM: PORTABLE CHEST 1 VIEW COMPARISON:  CXR 09/06/22 FINDINGS: Cardiomegaly. No pleural effusion. No pneumothorax. No focal airspace opacity. No radiographically apparent displaced rib fractures. Visualized upper abdomen unremarkable. Mild pulmonary venous congestion IMPRESSION: Cardiomegaly with mild pulmonary venous congestion. No evidence of  pleural effusion or overt pulmonary edema. Electronically Signed   By: Lorenza Cambridge M.D.   On: 09/09/2022 13:34   CT CHEST W CONTRAST  Result Date: 09/08/2022 CLINICAL DATA:  Pulmonary nodules EXAM: CT CHEST WITH CONTRAST TECHNIQUE: Multidetector CT imaging of the chest was performed during intravenous contrast administration. RADIATION DOSE REDUCTION: This exam was performed according to the departmental dose-optimization program which includes automated exposure control, adjustment of the mA and/or kV according to patient size and/or use of iterative reconstruction technique. CONTRAST:  75mL OMNIPAQUE IOHEXOL 350 MG/ML SOLN COMPARISON:  Chest radiograph dated 09/06/2018. CT chest dated 02/01/2012. FINDINGS: Cardiovascular: The heart is top-normal in size. No pericardial effusion. No evidence of thoracic aneurysm. Atherosclerotic calcifications of the aortic arch. Moderate coronary atherosclerosis of the LAD and left coronary artery. Mediastinum/Nodes: Small mediastinal nodes, including a 17 mm short axis low right paratracheal node (series 3/image 84) and 14 mm short axis right hilar/perihilar nodes. Visualized thyroid is unremarkable. Lungs/Pleura: Evaluation of the lung parenchyma is constrained by respiratory motion. Within that constraint, there are no suspicious pulmonary nodules. Specifically, the right basilar nodular opacity noted on recent prior CT is not evident on the current study. Mild patchy right lower lobe opacity, favoring atelectasis, with suspected trace right pleural effusion. New 10 mm opacity at the medial left lung base likely reflects additional atelectasis rather than a true nodule given rapid interval change. No pneumothorax. Upper Abdomen: Visualized upper abdomen is grossly unremarkable, noting vascular calcifications. Musculoskeletal: Degenerative changes of the visualized thoracolumbar spine. Destructive endplate changes at T8-9 with suspected paravertebral soft tissue (series  3/image 83), raising the possibility of discitis osteomyelitis. IMPRESSION: Destructive endplate changes at T8-9 with suspected paravertebral soft tissue, raising the possibility of discitis osteomyelitis. MRI of the thoracic spine with and without contrast is suggested for further evaluation. No suspicious pulmonary nodules. Specifically, the right basilar nodular opacity noted on recent prior CT is not evident on the current study. Mild patchy right lower lobe opacity, favoring atelectasis, with suspected trace right pleural effusion. Small mediastinal and right hilar/perihilar nodes, possibly reactive. Follow-up CT chest is suggested in 3 months. Aortic Atherosclerosis (ICD10-I70.0). Electronically Signed   By: Charline Bills M.D.   On: 09/08/2022 16:48   ECHOCARDIOGRAM COMPLETE  Result Date: 09/07/2022    ECHOCARDIOGRAM REPORT   Patient Name:   Linda Chase Date of Exam: 09/07/2022 Medical Rec #:  [id number]  Height:       64.0 in Accession #:    [id number]          Weight:       300.2 lb Date of Birth:  [birth year]          BSA:          2.325 m Patient Age:    66 years            BP:           111/78 mmHg Patient Gender: F                   HR:           101 bpm. Exam Location:  Inpatient Procedure: 2D Echo, 3D Echo, Cardiac Doppler and Color Doppler Indications:    I48.91* Unspeicified atrial fibrillation  History:        Patient has prior history of Echocardiogram examinations, most                 recent 02/02/2012. Abnormal ECG, COPD, Arrythmias:Atrial                 Fibrillation; Risk Factors:Diabetes and Hypertension.  Sonographer:    Sheralyn Boatman RDCS Referring Phys: Clydie Braun  Sonographer Comments: Patient is obese. Image acquisition challenging due to patient body habitus. Study delayed 20 minutes to get patient back in bed IMPRESSIONS  1. Left ventricular ejection fraction, by estimation, is 50%. Left ventricular ejection fraction by 3D volume is 49 %. The left ventricle has  mildly decreased function. The left ventricle demonstrates global hypokinesis. There is mild left ventricular hypertrophy. Left ventricular diastolic parameters are indeterminate. There is the interventricular septum is flattened in systole, consistent with right ventricular pressure overload.  2. Right ventricular systolic function is mildly reduced. The right ventricular size is mildly enlarged. Mildly increased right ventricular wall thickness. There is mildly elevated pulmonary artery systolic pressure. The estimated right ventricular systolic pressure is 44.8 mmHg.  3. Left atrial size was mildly dilated.  4. Right atrial size was mildly dilated.  5. The mitral valve is grossly normal. Trivial mitral valve regurgitation. No evidence of mitral stenosis. Moderate mitral annular calcification.  6. There is one beat of systolic flow reversal of hepatic vein spectral Doppler. Tricuspid valve regurgitation is moderate to severe.  7. Nodular calcification of leaflet tips. The aortic valve is tricuspid. There is mild calcification of the aortic valve. Aortic valve regurgitation is trivial. No aortic stenosis is present.  8. Aortic dilatation noted. There is mild dilatation of the ascending aorta, measuring 42 mm.  9. The inferior vena cava is dilated in size with <50% respiratory variability, suggesting right atrial pressure of 15 mmHg. Comparison(s): Unable to view prior study. FINDINGS  Left Ventricle: Left ventricular ejection fraction, by estimation, is 50%. Left ventricular ejection fraction by 3D volume is 49 %. The left ventricle has mildly decreased function. The left ventricle demonstrates global hypokinesis. The left ventricular internal cavity size was normal in size. There is mild left ventricular hypertrophy. The interventricular septum is flattened in systole, consistent with right ventricular pressure overload. Left ventricular diastolic parameters are indeterminate. Right Ventricle: The right  ventricular size is mildly enlarged. Mildly increased right ventricular wall thickness. Right ventricular systolic function is mildly reduced. There is mildly elevated pulmonary artery systolic pressure. The tricuspid regurgitant velocity is 2.73 m/s, and with an assumed right atrial pressure of 15 mmHg, the estimated right ventricular systolic pressure  is 44.8 mmHg. Left Atrium: Left atrial size was mildly dilated. Right Atrium: Right atrial size was mildly dilated. Pericardium: There is no evidence of pericardial effusion. Mitral Valve: The mitral valve is grossly normal. Moderate mitral annular calcification. Trivial mitral valve regurgitation. No evidence of mitral valve stenosis. Tricuspid Valve: There is one beat of systolic flow reversal of hepatic vein spectral Doppler. The tricuspid valve is grossly normal. Tricuspid valve regurgitation is moderate to severe. No evidence of tricuspid stenosis. Aortic Valve: Nodular calcification of leaflet tips. The aortic valve is tricuspid. There is mild calcification of the aortic valve. Aortic valve regurgitation is trivial. No aortic stenosis is present. Pulmonic Valve: The pulmonic valve was normal in structure. Pulmonic valve regurgitation is not visualized. No evidence of pulmonic stenosis. Aorta: Aortic dilatation noted. There is mild dilatation of the ascending aorta, measuring 42 mm. Venous: The inferior vena cava is dilated in size with less than 50% respiratory variability, suggesting right atrial pressure of 15 mmHg. IAS/Shunts: No atrial level shunt detected by color flow Doppler.  LEFT VENTRICLE PLAX 2D LVIDd:         4.60 cm LVIDs:         3.50 cm LV PW:         1.60 cm         3D Volume EF LV IVS:        1.20 cm         LV 3D EF:    Left                                             ventricul                                             ar LV Volumes (MOD)                            ejection LV vol d, MOD    69.4 ml                    fraction A2C:                                         by 3D LV vol d, MOD    64.8 ml                    volume is A4C:                                        49 %. LV vol s, MOD    28.0 ml A2C: LV vol s, MOD    29.9 ml       3D Volume EF: A4C:                           3D EF:        49 % LV SV MOD A2C:   41.4 ml  LV EDV:       170 ml LV SV MOD A4C:   64.8 ml       LV ESV:       87 ml LV SV MOD BP:    39.9 ml       LV SV:        83 ml RIGHT VENTRICLE             IVC RV S prime:     13.30 cm/s  IVC diam: 2.70 cm RVOT diam:      2.90 cm TAPSE (M-mode): 1.9 cm LEFT ATRIUM             Index        RIGHT ATRIUM           Index LA diam:        4.90 cm 2.11 cm/m   RA Area:     24.80 cm LA Vol (A2C):   64.5 ml 27.74 ml/m  RA Volume:   78.20 ml  33.64 ml/m LA Vol (A4C):   68.1 ml 29.29 ml/m LA Biplane Vol: 67.9 ml 29.20 ml/m  AORTIC VALVE LVOT Vmax:   136.50 cm/s LVOT Vmean:  93.500 cm/s LVOT VTI:    0.210 m  AORTA Ao Root diam: 3.10 cm Ao Asc diam:  4.15 cm MITRAL VALVE                TRICUSPID VALVE MV Area (PHT): 3.99 cm     TR Peak grad:   29.8 mmHg MV Decel Time: 190 msec     TR Vmax:        273.00 cm/s MV E velocity: 134.00 cm/s                             SHUNTS                             Systemic VTI:  0.21 m                             Pulmonic Diam: 2.90 cm Riley Lam MD Electronically signed by Riley Lam MD Signature Date/Time: 09/07/2022/1:29:37 PM    Final    CT ABDOMEN PELVIS W CONTRAST  Result Date: 09/06/2022 CLINICAL DATA:  Abdominal and flank pain. EXAM: CT ABDOMEN AND PELVIS WITH CONTRAST TECHNIQUE: Multidetector CT imaging of the abdomen and pelvis was performed using the standard protocol following bolus administration of intravenous contrast. RADIATION DOSE REDUCTION: This exam was performed according to the departmental dose-optimization program which includes automated exposure control, adjustment of the mA and/or kV according to patient size and/or use of iterative reconstruction technique.  CONTRAST:  OMNIPAQUE IOHEXOL 300 MG/ML  SOLN COMPARISON:  Limited comparison is available with CTA chest 02/01/2012. Portable chest has also been performed today. FINDINGS: Lower chest: The heart is moderately enlarged. There is no pericardial effusion. Small hiatal hernia. There are three-vessel coronary calcifications greatest in the LAD. Scattered calcifications in the aortic valve leaflets. There are posterior atelectatic changes. Newly noted in the right lower lobe on 4:13 adjacent the posterior crest of the hemidiaphragm, there is a 1.6 cm nodular opacity. Also, at base of the right upper lobe on 4:3 there is a noncalcified 6 mm nodule not seen previously. Prompt chest CT is recommended to assess for additional nodules. Remaining lung  bases are clear. Hepatobiliary: Respiratory motion limits fine detail. No liver mass is seen through the breathing motion. The liver is 21 cm length and mildly steatotic. The gallbladder and bile ducts are unremarkable. Pancreas: No abnormality is seen through breathing motion. Spleen: No abnormality is seen through the breathing motion. No splenomegaly. Adrenals/Urinary Tract: Also with limited fine detail in the kidneys due to breathing motion. No focal abnormality is suspected in the adrenal glands and kidneys. There is a 3 mm caliceal stone in the inferior pole of the right kidney. No left nephrolithiasis or other right renal stones are seen. There is no hydronephrosis or ureteral stone. The bladder is unremarkable for the degree of distention. Stomach/Bowel: Unremarkable stomach, unremarkable unopacified small bowel. An appendix is not seen in this patient. There is moderate retained stool ascending and transverse colon. Left-sided diverticulosis but no evidence of acute diverticulitis. Vascular/Lymphatic: Aortic atherosclerosis. No enlarged abdominal or pelvic lymph nodes. There are however, multiple bilateral enlarged inguinal chain nodes, largest on the right is 2.3  cm in short axis and the largest on the left is 2 cm in short axis. Etiology indeterminate. Reproductive: The uterus is intact. There is a 1.9 cm calcified subserosal fibroid to the right along the fundus. No adnexal mass is seen. Multiple pelvic phleboliths. Other: Small umbilical and inguinal fat hernias. Mild body wall edema. Stranding in the lower abdominal wall is seen and could be part of the bilateral body wall edema or could be due to cellulitis. There is no free fluid, free hemorrhage or free air, or incarcerated hernia. Musculoskeletal: Advanced L4-5 facet hypertrophy, acquired spinal canal stenosis and grade 1 L4-5 spondylolisthesis are noted present. Degenerative changes in visualized lower thoracic spine. No acute or other significant osseous findings. IMPRESSION: 1. 1.6 cm nodular opacity in the right lower lobe and 6 mm nodule in the right upper lobe. Prompt chest CT is recommended to assess for additional nodules. 2. Cardiomegaly with calcific CAD and aortic atherosclerosis. 3. Small hiatal hernia. 4. Constipation and diverticulosis. 5. Nonobstructive right-sided nephrolithiasis. No obstructing stones. 6. Bilateral inguinal chain adenopathy of uncertain etiology. 7. Lower abdominal wall stranding which could be part of the bilateral body wall edema or could be due to cellulitis. 8. Small umbilical and inguinal fat hernias. 9. Advanced L4-5 facet hypertrophy, acquired spinal canal stenosis and grade 1 L4-5 spondylolisthesis. 10. Mild hepatic steatosis. Aortic Atherosclerosis (ICD10-I70.0). Electronically Signed   By: Almira Bar M.D.   On: 09/06/2022 05:16   CT L-SPINE NO CHARGE  Result Date: 09/06/2022 CLINICAL DATA:  Abdominal/flank pain with stone suspected EXAM: CT Lumbar Spine with contrast TECHNIQUE: Technique: Multiplanar CT images of the lumbar spine were reconstructed from contemporary CT of the Abdomen and Pelvis. RADIATION DOSE REDUCTION: This exam was performed according to the  departmental dose-optimization program which includes automated exposure control, adjustment of the mA and/or kV according to patient size and/or use of iterative reconstruction technique. CONTRAST:  None additional COMPARISON:  None Available. FINDINGS: Segmentation: 5 lumbar type vertebrae. Alignment: Mild degenerative anterolisthesis at L4-5 and retrolisthesis at L5-S1. Vertebrae: No acute fracture or focal pathologic process. Paraspinal and other soft tissues: Reported separately Disc levels: T12- L1: Facet and costovertebral spurring eccentric to the left with mild foraminal encroachment. L1-L2: Mild disc bulging and facet spurring L2-L3: Mild disc bulging and facet spurring L3-L4: Moderate degenerative facet hypertrophy. Circumferential disc bulging. Moderate spinal and biforaminal stenosis L4-L5: Advanced facet osteoarthritis with spurring and anterolisthesis. The disc is narrowed and bulging. Advanced  appearing spinal and biforaminal stenosis L5-S1:Moderate degenerative facet spurring asymmetric to the left. Mild disc bulging and mild left foraminal narrowing. IMPRESSION: 1. No acute finding. 2. Generalized lumbar spine degeneration especially affecting lower lumbar facets with L4-5 and L5-S1 anterolisthesis. 3. L4-5 high-grade spinal stenosis. 4. Foraminal impingement suspected at both L3-4 and L4-5. Electronically Signed   By: Tiburcio Pea M.D.   On: 09/06/2022 04:56   DG Chest Portable 1 View  Result Date: 09/06/2022 CLINICAL DATA:  Shortness of breath EXAM: PORTABLE CHEST 1 VIEW COMPARISON:  04/06/2013 FINDINGS: Cardiac shadow is mildly enlarged. Lungs are clear bilaterally. No bony abnormality is noted. IMPRESSION: No active disease. Electronically Signed   By: Alcide Clever M.D.   On: 09/06/2022 03:13    Labs:  CBC: Recent Labs    09/11/22 0245 09/12/22 0234 09/13/22 0203 09/14/22 0229  WBC 11.4* 12.0* 12.2* 11.7*  HGB 10.0* 10.4* 10.4* 10.0*  HCT 35.0* 36.4 36.1 34.5*  PLT 242 298  350 353    COAGS: No results for input(s): "INR", "APTT" in the last 8760 hours.  BMP: Recent Labs    09/11/22 0245 09/12/22 0234 09/13/22 0203 09/14/22 0229  NA 136 136 135 137  K 3.8 3.8 3.7 3.9  CL 90* 90* 90* 91*  CO2 37* 35* 37* 37*  GLUCOSE 151* 166* 186* 212*  BUN 11 11 13 14   CALCIUM 8.5* 8.8* 8.7* 8.9  CREATININE 1.02* 0.82 0.94 1.03*  GFRNONAA >60 >60 >60 60*    LIVER FUNCTION TESTS: Recent Labs    09/11/22 0245 09/12/22 0234 09/13/22 0203 09/14/22 0229  BILITOT 0.5 0.5 0.6 0.5  AST 21 21 21 19   ALT 15 17 18 15   ALKPHOS 57 58 66 54  PROT 7.0 7.7 8.1 7.5  ALBUMIN 2.5* 2.7* 2.8* 2.6*    TUMOR MARKERS: No results for input(s): "AFPTM", "CEA", "CA199", "CHROMGRNA" in the last 8760 hours.  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 Patient had CT Chest on 09/08/22 revealing concern for discitis/osteomyelitis. Imaging has been reviewed and approved by Dr Tommi Rumps Melchor Amour. Patient is NPO and has had Eliquis held. Case tentatively scheduled to proceed on 09/14/22.   Risks and benefits of image-guided T8-T9 disc aspiration were discussed with the patient including, but not limited to  bleeding, infection, damage to adjacent structures including bone, nerves, and spinal cord.  This interventional procedure involves the use of X-rays and because of the nature of the planned procedure, it is possible that we will have prolonged use of X-ray fluoroscopy.  Potential radiation risks to you include (but are not limited to) the following: - A slightly elevated risk for cancer  several years later in life. This risk is typically less than 0.5% percent. This risk is low in comparison to the normal incidence of human cancer, which is 33% for women and 50% for men according to the [employer]. - Radiation induced injury can include skin redness, resembling a rash, tissue breakdown  / ulcers and hair loss (which can be temporary or permanent).   The likelihood of either of these occurring depends on the difficulty of the procedure and whether you are sensitive to radiation due to previous procedures, disease, or genetic conditions.   IF your procedure requires a prolonged use of radiation, you will be notified and given written instructions for further action.  It is your responsibility to monitor the irradiated area for  the 2 weeks following the procedure and to notify your physician if you are concerned that you have suffered a radiation induced injury.    All of the patient's questions were answered, patient is agreeable to proceed.  Consent signed and in chart.    I spent a total of 20 Minutes   in face to face in clinical consultation, greater than 50% of which was counseling/coordinating care for discitis/osteomyelitis.

## 2022-09-15 DIAGNOSIS — I480 Paroxysmal atrial fibrillation: Secondary | ICD-10-CM | POA: Diagnosis not present

## 2022-09-15 DIAGNOSIS — I5033 Acute on chronic diastolic (congestive) heart failure: Secondary | ICD-10-CM | POA: Diagnosis not present

## 2022-09-15 DIAGNOSIS — I50813 Acute on chronic right heart failure: Secondary | ICD-10-CM | POA: Diagnosis not present

## 2022-09-15 DIAGNOSIS — M4620 Osteomyelitis of vertebra, site unspecified: Secondary | ICD-10-CM | POA: Diagnosis not present

## 2022-09-15 DIAGNOSIS — E662 Morbid (severe) obesity with alveolar hypoventilation: Secondary | ICD-10-CM

## 2022-09-15 DIAGNOSIS — I2781 Cor pulmonale (chronic): Secondary | ICD-10-CM | POA: Diagnosis not present

## 2022-09-15 DIAGNOSIS — J9602 Acute respiratory failure with hypercapnia: Secondary | ICD-10-CM | POA: Diagnosis not present

## 2022-09-15 DIAGNOSIS — F17210 Nicotine dependence, cigarettes, uncomplicated: Secondary | ICD-10-CM | POA: Diagnosis not present

## 2022-09-15 LAB — BASIC METABOLIC PANEL
Anion gap: 9 (ref 5–15)
BUN: 15 mg/dL (ref 8–23)
CO2: 39 mmol/L — ABNORMAL HIGH (ref 22–32)
Calcium: 8.7 mg/dL — ABNORMAL LOW (ref 8.9–10.3)
Chloride: 89 mmol/L — ABNORMAL LOW (ref 98–111)
Creatinine, Ser: 1 mg/dL (ref 0.44–1.00)
GFR, Estimated: >60 mL/min (ref >60)
Glucose, Bld: 222 mg/dL — ABNORMAL HIGH (ref 70–99)
Potassium: 3.9 mmol/L (ref 3.5–5.1)
Sodium: 137 mmol/L (ref 135–145)

## 2022-09-15 LAB — CBC
HCT: 34.7 % — ABNORMAL LOW (ref 36.0–46.0)
Hemoglobin: 9.7 g/dL — ABNORMAL LOW (ref 12.0–15.0)
MCH: 23.7 pg — ABNORMAL LOW (ref 26.0–34.0)
MCHC: 28 g/dL — ABNORMAL LOW (ref 30.0–36.0)
MCV: 84.8 fL (ref 80.0–100.0)
Platelets: 375 10*3/uL (ref 150–400)
RBC: 4.09 MIL/uL (ref 3.87–5.11)
RDW: 14.6 % (ref 11.5–15.5)
WBC: 10.2 10*3/uL (ref 4.0–10.5)
nRBC: 0 % (ref 0.0–0.2)

## 2022-09-15 LAB — GLUCOSE, CAPILLARY
Glucose-Capillary: 178 mg/dL — ABNORMAL HIGH (ref 70–99)
Glucose-Capillary: 174 mg/dL — ABNORMAL HIGH (ref 70–99)
Glucose-Capillary: 182 mg/dL — ABNORMAL HIGH (ref 70–99)
Glucose-Capillary: 193 mg/dL — ABNORMAL HIGH (ref 70–99)

## 2022-09-15 MED ORDER — SENNA 8.6 MG PO TABS
1.0000 | ORAL_TABLET | Freq: Every day | ORAL | Status: DC
  Administered 2022-09-15 – 2022-09-22 (×6): 8.6 mg via ORAL
  Filled 2022-09-15 (×8): qty 1

## 2022-09-15 MED ORDER — DIPHENHYDRAMINE-ZINC ACETATE 2-0.1 % EX CREA
TOPICAL_CREAM | Freq: Three times a day (TID) | CUTANEOUS | Status: DC | PRN
  Filled 2022-09-15: qty 28

## 2022-09-15 NOTE — Progress Notes (Signed)
  Progress Note   Patient: Linda Chase VQQ:[id number] DOB: 25-Jun-1955 DOA: 09/06/2022     9 DOS: the patient was seen and examined on 09/15/2022 at 10:02AM      Brief hospital course: Linda Chase is a 67 y.o. F with MO, OSA not on CPAP, pAF on Eliquis, COPD, DM, HTN, hx cerebral aneurysm s/p clipping in 2006 in [place] who presented with over a month of progressive mid back pain, now severe.          Assessment and Plan: * Acute on chronic respiratory failure with hypercapnia due to CHF, OHS, OSA and cor pulmonale  Acute on chronic heart failure with preserved ejection fraction (HFpEF) (HCC) Pulmonary hypertension Net negative 1.2L yesterday, 13.5L on admission, weight down ~30lbs.  Cr stable Still likely edematous, hard to gauge with habitus.  The groin abscess she refers to is just edema.  She notes that over the last month, her right skin fold in the right thigh has enlarged.  It is not red, painful, or indurated, no sign of infection.  Has peau d'orange like edema would.   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 - Plan for aspiration tomorrow - Hold Abx until aspirate can be obtained - Hold Eliquis   Paroxysmal atrial fibrillation with RVR (HCC) Rates controlled - Continue amiodarone - Continue metoprolol - Hold Eliquis for vertebral aspiration, resume after   OSA (obstructive sleep apnea) - Needs to be on O2 at all times, BiPAP QHS   Coronary artery disease involving native coronary artery of native heart without angina pectoris Hypertension BP soft - Continue Lipitor, metoprolol, furosemide - Hold HCTZ, amlodipine  Diabetes mellitus (HCC) Glucose okay - Continue sliding scale corrections - Continue gabapentin for diabetic related neuropathy - Hold home metformin and Januvia while in the  hospital          Subjective: No new complaints, feels better, energy better.  Back pain okay.  No fever, no focal neurological complaints.     Physical Exam: BP 113/82 (BP Location: Right Wrist)   Pulse 70   Temp 97.8 F (36.6 C) (Oral)   Resp 19   Ht 5\' 4"  (1.626 m)   Wt 133.5 kg   SpO2 93%   BMI 50.52 kg/m   Obese adult female, on BSC, appropriate, no longer somnolent  Rate normal, JVP not visible, heart not audible, unchanged firm brawny venous changes to both legs but she thinks there is less swelling in legs Pannus unemarkable The identified area of her right thigh is an edematous skin roll Respiraotry effort nromal, given habitus, lungs not audible.    Affect flat, no psychomotor slowing or somnolence, limited engagement but oriented x3, moves all 4 with normal strength   Data Reviewed: Creatinine stable CBC unremarkable Discussed with ID More than 30 minutes spent just trying to track down outside records alone, still without success  Family Communication: None    Disposition: Status is: Inpatient         Author: Alberteen Sam, MD 09/15/2022 4:39 PM  For on call review [email or web address].

## 2022-09-15 NOTE — Progress Notes (Signed)
Mobility Specialist Progress Note:   09/15/22 1100  Mobility  Activity Ambulated with assistance in room  Level of Assistance Contact guard assist, steadying assist  Assistive Device Front wheel walker  Distance Ambulated (ft) 12 ft  Activity Response Tolerated well  Mobility Referral Yes  $Mobility charge 1 Mobility  Mobility Specialist Start Time (ACUTE ONLY) 1116  Mobility Specialist Stop Time (ACUTE ONLY) 1120  Mobility Specialist Time Calculation (min) (ACUTE ONLY) 4 min    Pt received in BR, requesting assistance back to chair. Asymptomatic throughout w/ no complaints. Pt left in chair with call bell and RN present.  D'Vante Earlene Plater Mobility Specialist Please contact via Special educational needs teacher or Rehab office at [phone]

## 2022-09-15 NOTE — Progress Notes (Signed)
   Pt was scheduled yesterday in IR for T8-9 disc aspiration. She was unable to tolerate getting to table; getting comfortable in any way. She would not continue. Was taken back to room.  RN has called today per MD request- and says pt is agreeable to reattempt procedure tomorrow--- If she can try to position herself on IR table.  New order has been placed. Will keep npo MN tonight  We will plan for 9/5.

## 2022-09-15 NOTE — Progress Notes (Signed)
   09/15/22 0015  BiPAP/CPAP/SIPAP  BiPAP/CPAP/SIPAP Pt Type Adult  BiPAP/CPAP/SIPAP SERVO  Reason BIPAP/CPAP not in use Non-compliant  Mask Type Full face mask  BiPAP/CPAP /SiPAP Vitals  Pulse Rate 70  Resp 20  SpO2 95 %  MEWS Score/Color  MEWS Score 1  MEWS Score Color [place] Si

## 2022-09-15 NOTE — Plan of Care (Signed)

## 2022-09-15 NOTE — Progress Notes (Addendum)
Patient Name: Linda Chase Date of Encounter: 09/15/2022 [facility] Cardiologist: Dietrich Pates, MD   Interval Summary  .    Overall asymptomatic from her atrial fibrillation.  No significant complaints and mentation seems to have improved throughout this admission.  She has concerns of right groin crease abscess.  This has been relayed to primary team.  Vital Signs .    Vitals:   09/15/22 0328 09/15/22 0717 09/15/22 0848 09/15/22 1124  BP:  107/76  96/70  Pulse:  77 69 78  Resp:  19  (!) 21  Temp:  97.7 F (36.5 C)  97.8 F (36.6 C)  TempSrc:  Oral  Oral  SpO2:  99%  97%  Weight: 133.5 kg     Height:        Intake/Output Summary (Last 24 hours) at 09/15/2022 1154 Last data filed at 09/15/2022 1145 Gross per 24 hour  Intake 360 ml  Output 1555 ml  Net -1195 ml      09/15/2022    3:28 AM 09/14/2022    6:36 AM 09/13/2022    5:17 AM  Last 3 Weights  Weight (lbs) 294 lb 5 oz 301 lb 2.4 oz 299 lb 9.7 oz  Weight (kg) 133.5 kg 136.6 kg 135.9 kg      Telemetry/ECG    Atrial fibrillation heart rates 70s to 90s personally Reviewed  CV Studies    Echocardiogram 09/07/2022  1. Left ventricular ejection fraction, by estimation, is 50%. Left  ventricular ejection fraction by 3D volume is 49 %. The left ventricle has  mildly decreased function. The left ventricle demonstrates global  hypokinesis. There is mild left ventricular  hypertrophy. Left ventricular diastolic parameters are indeterminate.  There is the interventricular septum is flattened in systole, consistent  with right ventricular pressure overload.   2. Right ventricular systolic function is mildly reduced. The right  ventricular size is mildly enlarged. Mildly increased right ventricular  wall thickness. There is mildly elevated pulmonary artery systolic  pressure. The estimated right ventricular  systolic pressure is 44.8 mmHg.   3. Left atrial size was mildly dilated.   4. Right atrial size was  mildly dilated.   5. The mitral valve is grossly normal. Trivial mitral valve  regurgitation. No evidence of mitral stenosis. Moderate mitral annular  calcification.   6. There is one beat of systolic flow reversal of hepatic vein spectral  Doppler. Tricuspid valve regurgitation is moderate to severe.   7. Nodular calcification of leaflet tips. The aortic valve is tricuspid.  There is mild calcification of the aortic valve. Aortic valve  regurgitation is trivial. No aortic stenosis is present.   8. Aortic dilatation noted. There is mild dilatation of the ascending  aorta, measuring 42 mm.   9. The inferior vena cava is dilated in size with <50% respiratory  variability, suggesting right atrial pressure of 15 mmHg.   Comparison(s): Unable to view prior study.     Physical Exam .   GEN: No acute distress.  2L [place]: Difficult to assess JVD Cardiac: Irregularly irregular, tachycardic Respiratory: crackles GI: Soft, nontender, non-distended  MS: 2+ edema  Patient Profile    Linda Chase is a 67 y.o. female has hx of proximal atrial fibrillation, hypertension, type 2 diabetes, OSA not on CPAP, cerebral aneurysm, morbid obesity and admitted on 09/06/2022 for for the evaluation of atrial fibrillation and CHF.  Initially presented for acute on chronic back pain with concerns of osteomyelitis  Assessment &  Plan .     Persistent atrial fibrillation  Reportedly was not taking Eliquis due to shipping issues.  Her rates are controlled now and between 70-90s. Has had soft pressures and did not tolerate dilt.   Continue amio 400mg  BID,  Eliquis 5 mg twice daily, Toprol XL 100. Plan for DOAC x 3 weeks and possible DCCV if patient remains in aifb. Long term maintenance of NSR may be difficult.   Acute on chronic HFpEF Pulm HTN Echocardiogram on 09/07/2022 shows EF 50%.  Mild LVH.  Mildly reduced RV function.  Mildly elevated RVSP 44.8.  Mildly dilated left and right atria. Still has  considerable amount of volume however no shortness of breath but on supplemental oxygen and BiPAP at night. I/O's not documented accurately put has had good diuresis this morning about 1L. Discussed with nurse and will ensure accurate documentation.  GDMT limited by soft blood pressures.  Not likely SGLT2 inhibitor candidate due to poor hygiene and morbid obesity. Continue IV Lasix 80 mg twice daily.  Need to reassess tomorrow whether this is still appropriate once to be get accurate documentation of I's and O's.  Question if she needs more. Weight on admission 327.6 pounds.  Currently 294.  Moderate to severe TR Likely related to possible OSA and obesity hypoventilation syndrome.  Also may be due to volume.  Will need to repeat echocardiogram outpatient.  Moderate coronary atherosclerosis of the LAD and left coronary artery Noted on CT chest.  No anginal complaints.  On statin and beta-blocker.  No aspirin due to the Eliquis.  Acute respiratory failure with hypercapnia Initially placed on BiPAP however this has been weaned off to supplemental oxygen via nasal cannula 2L.  Somnolence has improved.  Hypercapnia likely related to untreated OSA and obesity-hypoventilation syndrome. Will try to wean o2.  Suspected osteomyelitis T8-T9 Found based off abnormal CT.  Unable to perform MRI due to aneurysm clips of unknown MR compatibility.  IR has been consulted, has plans for aspiration tomorrow.  Cultures have been negative.  Diabetes type 2 A1c 7.8.  On SSI  Suspected OSA Morbid obesity COPD Maintains oxygen saturations at night.  On BiPAP at night.  Likely needs outpatient sleep study.  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   For questions or updates, please contact [facility] Please consult [email or web address] for contact info under        Signed, Abagail Kitchens, PA-C  Personally seen and examined. Agree with above.  67 year old with  morbid obesity BMI 50 with persistent atrial fibrillation vertebral suspected osteomyelitis diabetes secondary pulmonary hypertension  -Continue with amiodarone 400 mg twice a day with plans to decrease to 200 mg twice a day then 200 mg once a day eventually. -Agree with uninterrupted DOAC with cardioversion in approximately 3 weeks. -AF 50%  Chronic diastolic heart failure - Unable to utilize SGLT2 because of UTI risks - On Lasix 80 mg twice a day - Weight is down approximately 30 pounds.  Donato Schultz, MD

## 2022-09-15 NOTE — Progress Notes (Signed)
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 Lab with no leukocytosis. She was unable to tolerate IR aspiration yesterday and procedure aborted   Assessment 67 year old female with prior history of HTN, DM, cerebral aneurysm, PAF on AC, depression and morbid obesity who presented to ED on 8/26 with back pain and spasms. ID engaged for    # CT with Destructive endplate changes at T8-9 with suspected paravertebral soft tissue, raising the possibility of discitis osteomyelitis 8/29 blood cx NGTD CRP 8/26 4.3 unimpressive for discitis and osteomyelitis  Discussed extensively with Dr Maryfrances Bunnell today-  it appears patient had got MRI in the past after her brain clips. Dr Maryfrances Bunnell getting medical records from  [facility] for MRI compatibility of her aneurysm clips and possible aspiration for cultures/path thereafter   Recommendations Continue holding off on abtx pending MRI and IR aspiration unless she develops signs of  sepsis or any neurological symptoms or signs. Can start Vancomycin and ceftriaxone post IR procedure. Would send sample for aerobic/anaerobic cx, gram stain, fungal stain and cx, AFB stain and cultures including pathology.  I would prefer avoiding prolonged empiric IV antibiotics for 6 weeks without a confirmation in MRI or positive blood or disc cultures to indicate so.   ID will follow peripherally pending above, please call with active questions.    Rest of the management as per the primary team. Thank you for the consult. Please page with pertinent questions or concerns.  ______________________________________________________________________ Subjective patient seen and examined at the bedside.  Back pain is stable. Breathing is better. Reports having MRI done after her aneurysm clips done, but not sure.   Vitals BP 113/82 (BP Location: Right Wrist)   Pulse 70   Temp 97.8 F (36.6 C) (Oral)   Resp 19   Ht 5\' 4"  (1.626 m)   Wt 133.5 kg   SpO2 93%   BMI 50.52 kg/m     Physical Exam Constitutional: Morbidly obese female sitting in the bed on nasal cannula    Comments:   Cardiovascular:     Rate and Rhythm: Normal rate and Irregular rhythm.     Heart sounds:   Pulmonary:     Effort: Pulmonary effort is normal on nasal cannula    Comments:   Abdominal:     Palpations: Abdomen is soft.     Tenderness: Nondistended and nontender  Musculoskeletal:        General: No swelling or tenderness in peripheral joints  Skin:    Comments: No rashes, no signs of cellulitis on rt thigh or groin abscess ( edematous skin)  Neurological:     General: awake,  alert and oriented, following commands   Psychiatric:        Mood and Affect: Mood normal.   Pertinent Microbiology Results for orders placed or performed during the hospital encounter of 09/06/22  Culture, blood (Routine X 2) w Reflex to ID Panel     Status: None   Collection Time: 09/09/22 10:48 AM   Specimen: BLOOD LEFT  HAND  Result Value Ref Range Status   Specimen Description BLOOD LEFT HAND  Final   Special Requests   Final    BOTTLES DRAWN AEROBIC AND ANAEROBIC Blood Culture adequate volume   Culture   Final    NO GROWTH 5 DAYS Performed at [facility], [address], [place], [place] [address]    Report Status 09/14/2022 FINAL  Final  Culture, blood (Routine X 2) w Reflex to ID Panel     Status: None   Collection Time: 09/09/22 10:51 AM   Specimen: BLOOD LEFT HAND  Result Value Ref Range Status   Specimen Description BLOOD LEFT HAND  Final   Special Requests   Final    BOTTLES DRAWN AEROBIC AND ANAEROBIC Blood Culture adequate volume   Culture   Final    NO GROWTH 5 DAYS Performed at [facility], [address], [place], [place] [address]    Report Status 09/14/2022 FINAL  Final  SARS Coronavirus 2 by RT PCR (hospital order, performed in [facility] hospital lab) *cepheid single result test* Anterior Nasal Swab     Status: None   Collection Time: 09/10/22  8:45 AM   Specimen: Anterior Nasal Swab  Result Value Ref Range Status   SARS Coronavirus 2 by RT PCR NEGATIVE NEGATIVE Final    Comment: Performed at [facility], [address], [place], [place] [address]   Pertinent Lab.    Latest Ref Rng & Units 09/15/2022    9:35 AM 09/14/2022    2:29 AM 09/13/2022    2:03 AM  CBC  WBC 4.0 - 10.5 K/uL 10.2  11.7  12.2   Hemoglobin 12.0 - 15.0 g/dL 9.7  91.4  78.2   Hematocrit 36.0 - 46.0 % 34.7  34.5  36.1   Platelets 150 - 400 K/uL 375  353  350       Latest Ref Rng & Units 09/15/2022    9:35 AM 09/14/2022    2:29 AM 09/13/2022    2:03 AM  CMP  Glucose 70 - 99 mg/dL 956  213  086   BUN 8 - 23 mg/dL 15  14  13    Creatinine 0.44 - 1.00 mg/dL 5.78  4.69  6.29   Sodium 135 - 145 mmol/L 137  137  135   Potassium 3.5 - 5.1 mmol/L 3.9  3.9  3.7   Chloride 98 - 111 mmol/L 89  91  90   CO2 22 - 32 mmol/L 39  37  37   Calcium 8.9 - 10.3 mg/dL 8.7  8.9  8.7   Total Protein 6.5 - 8.1  g/dL  7.5  8.1   Total Bilirubin 0.3 - 1.2 mg/dL  0.5  0.6   Alkaline Phos 38 - 126 U/L  54  66   AST 15 - 41 U/L  19  21   ALT 0 - 44 U/L  15  18     Pertinent Imaging today Plain films and CT images have been personally visualized and interpreted; radiology reports have been reviewed. Decision making  incorporated into the Impression /   IR THORACIC DISC ASPIRATION W/IMG GUIDE  Result Date: 09/15/2022 INDICATION: Linda Chase is a 67 y.o. female with PMH significant arthritis, atrial fibrillation, cerebral aneurysm x2, depression, diabetes mellitus, and hypertension being seen today in relation to suspected discitis/osteomyelitis. Patient was admitted to [facility] on 09/06/22 after being found to be in acute respiratory failure. CT on 09/06/22 noted findings of pulmonary nodules, which prompted follow-up CT Chest on 8/28 which revealed concern for discitis/osteomyelitis at T8-T9. MRI is unable to be obtained due to aneurysm clips of unknown MR compatibility. IR was subsequently consulted for image-guided T8-T9 disc aspiration. EXAM: FLUOROSCOPY GUIDED T8-T9 DISC ASPIRATION MEDICATIONS: None. ANESTHESIA/SEDATION: None. COMPLICATIONS: None immediate. FLUOROSCOPY: No radiation exposure performed. PROCEDURE: Informed written consent was obtained from the patient after a thorough discussion of the procedural risks, benefits and alternatives. All questions were addressed. Patient was transferred to the angiography table. However, she was unable to tolerate lying on her stomach for the intervention. Procedure was, therefore, aborted. IMPRESSION: Patient unable to tolerate positioning in the procedural table. Procedure was aborted. Electronically Signed   By: Baldemar Lenis M.D.   On: 09/15/2022 14:09   CT HEAD WO CONTRAST ( )  Result Date: 09/10/2022 CLINICAL DATA:  CT for MRI clearance. EXAM: CT HEAD WITHOUT CONTRAST TECHNIQUE: Contiguous axial images were obtained from the base of the  skull through the vertex without intravenous contrast. RADIATION DOSE REDUCTION: This exam was performed according to the departmental dose-optimization program which includes automated exposure control, adjustment of the mA and/or kV according to patient size and/or use of iterative reconstruction technique. COMPARISON:  None Available. FINDINGS: Brain: Cystic encephalomalacia in the right parieto-occipital region. Encephalomalacia in the anterior right temporal lobe. No evidence of acute large vascular territory infarct, acute hemorrhage, mass lesion, midline shift or hydrocephalus. Vascular: Aneurysm clips in the right paraclinoid region. Skull: Right-sided craniotomy.  No evidence of acute fracture. Sinuses/Orbits: No sub clear sinuses.  No acute orbital findings. Other: No mastoid effusions. IMPRESSION: 1. Positive for aneurysm clips. MRI is likely contraindicated, but recommend correlation with prior surgical note and exact type of clip if available. 2. No evidence of acute intracranial abnormality. 3. Encephalomalacia in the right parieto-occipital region and anterior right temporal lobe. Electronically Signed   By: Feliberto Harts M.D.   On: 09/10/2022 17:34   DG CHEST PORT 1 VIEW  Result Date: 09/09/2022 CLINICAL DATA:  CHF EXAM: PORTABLE CHEST 1 VIEW COMPARISON:  CXR 09/06/22 FINDINGS: Cardiomegaly. No pleural effusion. No pneumothorax. No focal airspace opacity. No radiographically apparent displaced rib fractures. Visualized upper abdomen unremarkable. Mild pulmonary venous congestion IMPRESSION: Cardiomegaly with mild pulmonary venous congestion. No evidence of pleural effusion or overt pulmonary edema. Electronically Signed   By: Lorenza Cambridge M.D.   On: 09/09/2022 13:34   CT CHEST W CONTRAST  Result Date: 09/08/2022 CLINICAL DATA:  Pulmonary nodules EXAM: CT CHEST WITH CONTRAST TECHNIQUE: Multidetector CT imaging of the chest was performed during intravenous contrast administration. RADIATION  DOSE REDUCTION: This exam was performed according to the departmental dose-optimization program which includes automated exposure control, adjustment of the mA and/or kV according to patient size and/or use of iterative reconstruction technique. CONTRAST:  75mL OMNIPAQUE IOHEXOL 350 MG/ML SOLN COMPARISON:  Chest radiograph dated 09/06/2018. CT chest dated 02/01/2012. FINDINGS: Cardiovascular: The heart is top-normal in size. No pericardial effusion. No evidence of thoracic aneurysm. Atherosclerotic calcifications of the aortic arch. Moderate coronary atherosclerosis of the LAD and left coronary  artery. Mediastinum/Nodes: Small mediastinal nodes, including a 17 mm short axis low right paratracheal node (series 3/image 84) and 14 mm short axis right hilar/perihilar nodes. Visualized thyroid is unremarkable. Lungs/Pleura: Evaluation of the lung parenchyma is constrained by respiratory motion. Within that constraint, there are no suspicious pulmonary nodules. Specifically, the right basilar nodular opacity noted on recent prior CT is not evident on the current study. Mild patchy right lower lobe opacity, favoring atelectasis, with suspected trace right pleural effusion. New 10 mm opacity at the medial left lung base likely reflects additional atelectasis rather than a true nodule given rapid interval change. No pneumothorax. Upper Abdomen: Visualized upper abdomen is grossly unremarkable, noting vascular calcifications. Musculoskeletal: Degenerative changes of the visualized thoracolumbar spine. Destructive endplate changes at T8-9 with suspected paravertebral soft tissue (series 3/image 83), raising the possibility of discitis osteomyelitis. IMPRESSION: Destructive endplate changes at T8-9 with suspected paravertebral soft tissue, raising the possibility of discitis osteomyelitis. MRI of the thoracic spine with and without contrast is suggested for further evaluation. No suspicious pulmonary nodules. Specifically, the  right basilar nodular opacity noted on recent prior CT is not evident on the current study. Mild patchy right lower lobe opacity, favoring atelectasis, with suspected trace right pleural effusion. Small mediastinal and right hilar/perihilar nodes, possibly reactive. Follow-up CT chest is suggested in 3 months. Aortic Atherosclerosis (ICD10-I70.0). Electronically Signed   By: Charline Bills M.D.   On: 09/08/2022 16:48   ECHOCARDIOGRAM COMPLETE  Result Date: 09/07/2022    ECHOCARDIOGRAM REPORT   Patient Name:   Linda Chase Date of Exam: 09/07/2022 Medical Rec #:  [id number]           Height:       64.0 in Accession #:    [id number]          Weight:       300.2 lb Date of Birth:  08/18/1955          BSA:          2.325 m Patient Age:    66 years            BP:           111/78 mmHg Patient Gender: F                   HR:           101 bpm. Exam Location:  Inpatient Procedure: 2D Echo, 3D Echo, Cardiac Doppler and Color Doppler Indications:    I48.91* Unspeicified atrial fibrillation  History:        Patient has prior history of Echocardiogram examinations, most                 recent 02/02/2012. Abnormal ECG, COPD, Arrythmias:Atrial                 Fibrillation; Risk Factors:Diabetes and Hypertension.  Sonographer:    Sheralyn Boatman RDCS Referring Phys: Clydie Braun  Sonographer Comments: Patient is obese. Image acquisition challenging due to patient body habitus. Study delayed 20 minutes to get patient back in bed IMPRESSIONS  1. Left ventricular ejection fraction, by estimation, is 50%. Left ventricular ejection fraction by 3D volume is 49 %. The left ventricle has mildly decreased function. The left ventricle demonstrates global hypokinesis. There is mild left ventricular hypertrophy. Left ventricular diastolic parameters are indeterminate. There is the interventricular septum is flattened in systole, consistent with right ventricular pressure overload.  2. Right ventricular systolic function is mildly  reduced. The right ventricular size is mildly enlarged. Mildly increased right ventricular wall thickness. There is mildly elevated pulmonary artery systolic pressure. The estimated right ventricular systolic pressure is 44.8 mmHg.  3. Left atrial size was mildly dilated.  4. Right atrial size was mildly dilated.  5. The mitral valve is grossly normal. Trivial mitral valve regurgitation. No evidence of mitral stenosis. Moderate mitral annular calcification.  6. There is one beat of systolic flow reversal of hepatic vein spectral Doppler. Tricuspid valve regurgitation is moderate to severe.  7. Nodular calcification of leaflet tips. The aortic valve is tricuspid. There is mild calcification of the aortic valve. Aortic valve regurgitation is trivial. No aortic stenosis is present.  8. Aortic dilatation noted. There is mild dilatation of the ascending aorta, measuring 42 mm.  9. The inferior vena cava is dilated in size with <50% respiratory variability, suggesting right atrial pressure of 15 mmHg. Comparison(s): Unable to view prior study. FINDINGS  Left Ventricle: Left ventricular ejection fraction, by estimation, is 50%. Left ventricular ejection fraction by 3D volume is 49 %. The left ventricle has mildly decreased function. The left ventricle demonstrates global hypokinesis. The left ventricular internal cavity size was normal in size. There is mild left ventricular hypertrophy. The interventricular septum is flattened in systole, consistent with right ventricular pressure overload. Left ventricular diastolic parameters are indeterminate. Right Ventricle: The right ventricular size is mildly enlarged. Mildly increased right ventricular wall thickness. Right ventricular systolic function is mildly reduced. There is mildly elevated pulmonary artery systolic pressure. The tricuspid regurgitant velocity is 2.73 m/s, and with an assumed right atrial pressure of 15 mmHg, the estimated right ventricular systolic pressure  is 44.8 mmHg. Left Atrium: Left atrial size was mildly dilated. Right Atrium: Right atrial size was mildly dilated. Pericardium: There is no evidence of pericardial effusion. Mitral Valve: The mitral valve is grossly normal. Moderate mitral annular calcification. Trivial mitral valve regurgitation. No evidence of mitral valve stenosis. Tricuspid Valve: There is one beat of systolic flow reversal of hepatic vein spectral Doppler. The tricuspid valve is grossly normal. Tricuspid valve regurgitation is moderate to severe. No evidence of tricuspid stenosis. Aortic Valve: Nodular calcification of leaflet tips. The aortic valve is tricuspid. There is mild calcification of the aortic valve. Aortic valve regurgitation is trivial. No aortic stenosis is present. Pulmonic Valve: The pulmonic valve was normal in structure. Pulmonic valve regurgitation is not visualized. No evidence of pulmonic stenosis. Aorta: Aortic dilatation noted. There is mild dilatation of the ascending aorta, measuring 42 mm. Venous: The inferior vena cava is dilated in size with less than 50% respiratory variability, suggesting right atrial pressure of 15 mmHg. IAS/Shunts: No atrial level shunt detected by color flow Doppler.  LEFT VENTRICLE PLAX 2D LVIDd:         4.60 cm LVIDs:         3.50 cm LV PW:         1.60 cm         3D Volume EF LV IVS:        1.20 cm         LV 3D EF:    Left                                             ventricul  ar LV Volumes (MOD)                            ejection LV vol d, MOD    69.4 ml                    fraction A2C:                                        by 3D LV vol d, MOD    64.8 ml                    volume is A4C:                                        49 %. LV vol s, MOD    28.0 ml A2C: LV vol s, MOD    29.9 ml       3D Volume EF: A4C:                           3D EF:        49 % LV SV MOD A2C:   41.4 ml       LV EDV:       170 ml LV SV MOD A4C:   64.8 ml       LV ESV:        87 ml LV SV MOD BP:    39.9 ml       LV SV:        83 ml RIGHT VENTRICLE             IVC RV S prime:     13.30 cm/s  IVC diam: 2.70 cm RVOT diam:      2.90 cm TAPSE (M-mode): 1.9 cm LEFT ATRIUM             Index        RIGHT ATRIUM           Index LA diam:        4.90 cm 2.11 cm/m   RA Area:     24.80 cm LA Vol (A2C):   64.5 ml 27.74 ml/m  RA Volume:   78.20 ml  33.64 ml/m LA Vol (A4C):   68.1 ml 29.29 ml/m LA Biplane Vol: 67.9 ml 29.20 ml/m  AORTIC VALVE LVOT Vmax:   136.50 cm/s LVOT Vmean:  93.500 cm/s LVOT VTI:    0.210 m  AORTA Ao Root diam: 3.10 cm Ao Asc diam:  4.15 cm MITRAL VALVE                TRICUSPID VALVE MV Area (PHT): 3.99 cm     TR Peak grad:   29.8 mmHg MV Decel Time: 190 msec     TR Vmax:        273.00 cm/s MV E velocity: 134.00 cm/s                             SHUNTS  Systemic VTI:  0.21 m                             Pulmonic Diam: 2.90 cm Riley Lam MD Electronically signed by Riley Lam MD Signature Date/Time: 09/07/2022/1:29:37 PM    Final    CT ABDOMEN PELVIS W CONTRAST  Result Date: 09/06/2022 CLINICAL DATA:  Abdominal and flank pain. EXAM: CT ABDOMEN AND PELVIS WITH CONTRAST TECHNIQUE: Multidetector CT imaging of the abdomen and pelvis was performed using the standard protocol following bolus administration of intravenous contrast. RADIATION DOSE REDUCTION: This exam was performed according to the departmental dose-optimization program which includes automated exposure control, adjustment of the mA and/or kV according to patient size and/or use of iterative reconstruction technique. CONTRAST:  OMNIPAQUE IOHEXOL 300 MG/ML  SOLN COMPARISON:  Limited comparison is available with CTA chest 02/01/2012. Portable chest has also been performed today. FINDINGS: Lower chest: The heart is moderately enlarged. There is no pericardial effusion. Small hiatal hernia. There are three-vessel coronary calcifications greatest in the LAD.  Scattered calcifications in the aortic valve leaflets. There are posterior atelectatic changes. Newly noted in the right lower lobe on 4:13 adjacent the posterior crest of the hemidiaphragm, there is a 1.6 cm nodular opacity. Also, at base of the right upper lobe on 4:3 there is a noncalcified 6 mm nodule not seen previously. Prompt chest CT is recommended to assess for additional nodules. Remaining lung bases are clear. Hepatobiliary: Respiratory motion limits fine detail. No liver mass is seen through the breathing motion. The liver is 21 cm length and mildly steatotic. The gallbladder and bile ducts are unremarkable. Pancreas: No abnormality is seen through breathing motion. Spleen: No abnormality is seen through the breathing motion. No splenomegaly. Adrenals/Urinary Tract: Also with limited fine detail in the kidneys due to breathing motion. No focal abnormality is suspected in the adrenal glands and kidneys. There is a 3 mm caliceal stone in the inferior pole of the right kidney. No left nephrolithiasis or other right renal stones are seen. There is no hydronephrosis or ureteral stone. The bladder is unremarkable for the degree of distention. Stomach/Bowel: Unremarkable stomach, unremarkable unopacified small bowel. An appendix is not seen in this patient. There is moderate retained stool ascending and transverse colon. Left-sided diverticulosis but no evidence of acute diverticulitis. Vascular/Lymphatic: Aortic atherosclerosis. No enlarged abdominal or pelvic lymph nodes. There are however, multiple bilateral enlarged inguinal chain nodes, largest on the right is 2.3 cm in short axis and the largest on the left is 2 cm in short axis. Etiology indeterminate. Reproductive: The uterus is intact. There is a 1.9 cm calcified subserosal fibroid to the right along the fundus. No adnexal mass is seen. Multiple pelvic phleboliths. Other: Small umbilical and inguinal fat hernias. Mild body wall edema. Stranding in the  lower abdominal wall is seen and could be part of the bilateral body wall edema or could be due to cellulitis. There is no free fluid, free hemorrhage or free air, or incarcerated hernia. Musculoskeletal: Advanced L4-5 facet hypertrophy, acquired spinal canal stenosis and grade 1 L4-5 spondylolisthesis are noted present. Degenerative changes in visualized lower thoracic spine. No acute or other significant osseous findings. IMPRESSION: 1. 1.6 cm nodular opacity in the right lower lobe and 6 mm nodule in the right upper lobe. Prompt chest CT is recommended to assess for additional nodules. 2. Cardiomegaly with calcific CAD and aortic atherosclerosis. 3. Small hiatal hernia. 4. Constipation and  diverticulosis. 5. Nonobstructive right-sided nephrolithiasis. No obstructing stones. 6. Bilateral inguinal chain adenopathy of uncertain etiology. 7. Lower abdominal wall stranding which could be part of the bilateral body wall edema or could be due to cellulitis. 8. Small umbilical and inguinal fat hernias. 9. Advanced L4-5 facet hypertrophy, acquired spinal canal stenosis and grade 1 L4-5 spondylolisthesis. 10. Mild hepatic steatosis. Aortic Atherosclerosis (ICD10-I70.0). Electronically Signed   By: Almira Bar M.D.   On: 09/06/2022 05:16   CT L-SPINE NO CHARGE  Result Date: 09/06/2022 CLINICAL DATA:  Abdominal/flank pain with stone suspected EXAM: CT Lumbar Spine with contrast TECHNIQUE: Technique: Multiplanar CT images of the lumbar spine were reconstructed from contemporary CT of the Abdomen and Pelvis. RADIATION DOSE REDUCTION: This exam was performed according to the departmental dose-optimization program which includes automated exposure control, adjustment of the mA and/or kV according to patient size and/or use of iterative reconstruction technique. CONTRAST:  None additional COMPARISON:  None Available. FINDINGS: Segmentation: 5 lumbar type vertebrae. Alignment: Mild degenerative anterolisthesis at L4-5 and  retrolisthesis at L5-S1. Vertebrae: No acute fracture or focal pathologic process. Paraspinal and other soft tissues: Reported separately Disc levels: T12- L1: Facet and costovertebral spurring eccentric to the left with mild foraminal encroachment. L1-L2: Mild disc bulging and facet spurring L2-L3: Mild disc bulging and facet spurring L3-L4: Moderate degenerative facet hypertrophy. Circumferential disc bulging. Moderate spinal and biforaminal stenosis L4-L5: Advanced facet osteoarthritis with spurring and anterolisthesis. The disc is narrowed and bulging. Advanced appearing spinal and biforaminal stenosis L5-S1:Moderate degenerative facet spurring asymmetric to the left. Mild disc bulging and mild left foraminal narrowing. IMPRESSION: 1. No acute finding. 2. Generalized lumbar spine degeneration especially affecting lower lumbar facets with L4-5 and L5-S1 anterolisthesis. 3. L4-5 high-grade spinal stenosis. 4. Foraminal impingement suspected at both L3-4 and L4-5. Electronically Signed   By: Tiburcio Pea M.D.   On: 09/06/2022 04:56   DG Chest Portable 1 View  Result Date: 09/06/2022 CLINICAL DATA:  Shortness of breath EXAM: PORTABLE CHEST 1 VIEW COMPARISON:  04/06/2013 FINDINGS: Cardiac shadow is mildly enlarged. Lungs are clear bilaterally. No bony abnormality is noted. IMPRESSION: No active disease. Electronically Signed   By: Alcide Clever M.D.   On: 09/06/2022 03:13    I have personally spent 51  minutes involved in face-to-face and non-face-to-face activities for this patient on the day of the visit. Professional time spent includes the following activities: Preparing to see the patient (review of tests), Obtaining and/or reviewing separately obtained history (admission/discharge record), Performing a medically appropriate examination and/or evaluation , Ordering medications/tests/procedures, referring and communicating with other health care professionals, Documenting clinical information in the EMR,  Independently interpreting results (not separately reported), Communicating results to the patient/family/caregiver, Counseling and educating the patient/family/caregiver and Care coordination (not separately reported).   Plan d/w requesting provider as well as ID pharm D  Note: This document was prepared using dragon voice recognition software and may include unintentional dictation errors.   Electronically signed by:   Odette Fraction, MD Infectious Disease Physician [facility] for Infectious Disease Pager: [phone]

## 2022-09-15 NOTE — Progress Notes (Signed)
Mobility Specialist Progress Note:   09/15/22 1000  Mobility  Activity Ambulated with assistance in hallway  Level of Assistance Contact guard assist, steadying assist  Assistive Device Front wheel walker  Distance Ambulated (ft) 300 ft  Activity Response Tolerated well  Mobility Referral Yes  $Mobility charge 1 Mobility  Mobility Specialist Start Time (ACUTE ONLY) 0954  Mobility Specialist Stop Time (ACUTE ONLY) 1018  Mobility Specialist Time Calculation (min) (ACUTE ONLY) 24 min    Pre Mobility: 79 HR,  97% SpO2 During Mobility: 113 HR,  93% SpO2 Post Mobility:  87 HR,  98% SpO2  Pt received in chair, agreeable to mobility. MinA for STS. Ambulated w/ CG. Took x2 standing rest breaks to stretch back. Asymptomatic throughout. Requiring verbal cues for RW positioning. Pt left in chair with call bell and all needs met.  D'Vante Earlene Plater Mobility Specialist Please contact via Special educational needs teacher or Rehab office at [phone]

## 2022-09-15 NOTE — Progress Notes (Addendum)
Patient disconnected EKG cords and ambulated to the bathroom without assistance.  Patient has been educated about calling for staff assistance prior to ambulation. Patient declining staff help in the bathroom.  Inaccurate intake and output as patient incontinent/urgency when standing, patient declining purewick.   2243-pt agreeable to a purewick at this time.

## 2022-09-16 ENCOUNTER — Encounter (HOSPITAL_COMMUNITY)
Admission: EM | Disposition: A | Discharge status: Home Health | Payer: Self-pay | Source: Home / Self Care | Attending: Family Medicine

## 2022-09-16 ENCOUNTER — Inpatient Hospital Stay (HOSPITAL_COMMUNITY): Payer: Medicare HMO | Admitting: Anesthesiology

## 2022-09-16 ENCOUNTER — Inpatient Hospital Stay (HOSPITAL_COMMUNITY): Payer: Medicare HMO

## 2022-09-16 ENCOUNTER — Encounter (HOSPITAL_COMMUNITY): Payer: Self-pay | Admitting: Family Medicine

## 2022-09-16 DIAGNOSIS — I2781 Cor pulmonale (chronic): Secondary | ICD-10-CM | POA: Diagnosis not present

## 2022-09-16 DIAGNOSIS — I50813 Acute on chronic right heart failure: Secondary | ICD-10-CM | POA: Diagnosis not present

## 2022-09-16 DIAGNOSIS — M4644 Discitis, unspecified, thoracic region: Secondary | ICD-10-CM

## 2022-09-16 DIAGNOSIS — G4733 Obstructive sleep apnea (adult) (pediatric): Secondary | ICD-10-CM

## 2022-09-16 DIAGNOSIS — I5033 Acute on chronic diastolic (congestive) heart failure: Secondary | ICD-10-CM | POA: Diagnosis not present

## 2022-09-16 DIAGNOSIS — J9602 Acute respiratory failure with hypercapnia: Secondary | ICD-10-CM | POA: Diagnosis not present

## 2022-09-16 HISTORY — PX: IR THORACIC DISC ASPIRATION W/IMG GUIDE: IMG943

## 2022-09-16 HISTORY — PX: RADIOLOGY WITH ANESTHESIA: SHX6223

## 2022-09-16 LAB — GLUCOSE, CAPILLARY
Glucose-Capillary: 155 mg/dL — ABNORMAL HIGH (ref 70–99)
Glucose-Capillary: 118 mg/dL — ABNORMAL HIGH (ref 70–99)
Glucose-Capillary: 122 mg/dL — ABNORMAL HIGH (ref 70–99)
Glucose-Capillary: 214 mg/dL — ABNORMAL HIGH (ref 70–99)
Glucose-Capillary: 144 mg/dL — ABNORMAL HIGH (ref 70–99)

## 2022-09-16 LAB — BASIC METABOLIC PANEL
Anion gap: 8 (ref 5–15)
BUN: 19 mg/dL (ref 8–23)
CO2: 38 mmol/L — ABNORMAL HIGH (ref 22–32)
Calcium: 8.4 mg/dL — ABNORMAL LOW (ref 8.9–10.3)
Chloride: 90 mmol/L — ABNORMAL LOW (ref 98–111)
Creatinine, Ser: 1.05 mg/dL — ABNORMAL HIGH (ref 0.44–1.00)
GFR, Estimated: 59 mL/min — ABNORMAL LOW (ref >60)
Glucose, Bld: 172 mg/dL — ABNORMAL HIGH (ref 70–99)
Potassium: 3.6 mmol/L (ref 3.5–5.1)
Sodium: 136 mmol/L (ref 135–145)

## 2022-09-16 LAB — PROTIME-INR
INR: 1.1 (ref 0.8–1.2)
Prothrombin Time: 14.8 s (ref 11.4–15.2)

## 2022-09-16 LAB — CK: Total CK: 75 U/L (ref 38–234)

## 2022-09-16 SURGERY — IR WITH ANESTHESIA
Anesthesia: Monitor Anesthesia Care

## 2022-09-16 MED ORDER — CHLORHEXIDINE GLUCONATE 0.12 % MT SOLN: 15.0000 mL | Freq: Once | OROMUCOSAL | Status: AC

## 2022-09-16 MED ORDER — POTASSIUM CHLORIDE CRYS ER 20 MEQ PO TBCR
40.0000 meq | EXTENDED_RELEASE_TABLET | Freq: Once | ORAL | Status: AC
  Administered 2022-09-16: 40 meq via ORAL
  Filled 2022-09-16: qty 2

## 2022-09-16 MED ORDER — METOPROLOL SUCCINATE ER 25 MG PO TB24
ORAL_TABLET | ORAL | Status: AC
  Administered 2022-09-16: 100 mg via ORAL
  Filled 2022-09-16: qty 4

## 2022-09-16 MED ORDER — FENTANYL CITRATE (PF) 100 MCG/2ML IJ SOLN
INTRAMUSCULAR | Status: AC
  Filled 2022-09-16: qty 2

## 2022-09-16 MED ORDER — LIDOCAINE HCL (PF) 1 % IJ SOLN
INTRAMUSCULAR | Status: AC
  Filled 2022-09-16: qty 30

## 2022-09-16 MED ORDER — DEXMEDETOMIDINE HCL IN NACL 80 MCG/20ML IV SOLN
INTRAVENOUS | Status: AC
  Filled 2022-09-16: qty 20

## 2022-09-16 MED ORDER — FENTANYL CITRATE (PF) 100 MCG/2ML IJ SOLN
INTRAMUSCULAR | Status: DC | PRN
  Administered 2022-09-16 (×2): 50 ug via INTRAVENOUS

## 2022-09-16 MED ORDER — OXYCODONE HCL 5 MG PO TABS: 5.0000 mg | ORAL_TABLET | Freq: Once | ORAL | Status: DC | PRN

## 2022-09-16 MED ORDER — ORAL CARE MOUTH RINSE: 15.0000 mL | Freq: Once | OROMUCOSAL | Status: AC

## 2022-09-16 MED ORDER — BUPIVACAINE HCL (PF) 0.5 % IJ SOLN
30.0000 mL | Freq: Once | INTRAMUSCULAR | Status: DC
  Filled 2022-09-16: qty 30

## 2022-09-16 MED ORDER — LIDOCAINE HCL (PF) 1 % IJ SOLN
30.0000 mL | Freq: Once | INTRAMUSCULAR | Status: AC
  Administered 2022-09-16: 10 mL via INTRADERMAL
  Filled 2022-09-16: qty 30

## 2022-09-16 MED ORDER — ACETAMINOPHEN 10 MG/ML IV SOLN: 1000.0000 mg | Freq: Once | INTRAVENOUS | Status: DC | PRN

## 2022-09-16 MED ORDER — CHLORHEXIDINE GLUCONATE 0.12 % MT SOLN
OROMUCOSAL | Status: AC
  Administered 2022-09-16: 15 mL via OROMUCOSAL
  Filled 2022-09-16: qty 15

## 2022-09-16 MED ORDER — FENTANYL CITRATE (PF) 100 MCG/2ML IJ SOLN: 25.0000 ug | INTRAMUSCULAR | Status: DC | PRN

## 2022-09-16 MED ORDER — BUPIVACAINE HCL (PF) 0.5 % IJ SOLN
INTRAMUSCULAR | Status: AC
  Filled 2022-09-16: qty 30

## 2022-09-16 MED ORDER — SODIUM CHLORIDE 0.9 % IV SOLN
8.0000 mg/kg | Freq: Every day | INTRAVENOUS | Status: DC
  Administered 2022-09-16 – 2022-09-19 (×4): 700 mg via INTRAVENOUS
  Filled 2022-09-16 (×6): qty 14

## 2022-09-16 MED ORDER — ONDANSETRON HCL 4 MG/2ML IJ SOLN: 4.0000 mg | Freq: Once | INTRAMUSCULAR | Status: DC | PRN

## 2022-09-16 MED ORDER — APIXABAN 5 MG PO TABS
5.0000 mg | ORAL_TABLET | Freq: Two times a day (BID) | ORAL | Status: DC
  Administered 2022-09-17 – 2022-09-22 (×11): 5 mg via ORAL
  Filled 2022-09-16 (×11): qty 1

## 2022-09-16 MED ORDER — LACTATED RINGERS IV SOLN: INTRAVENOUS | Status: DC

## 2022-09-16 MED ORDER — OXYCODONE HCL 5 MG/5ML PO SOLN: 5.0000 mg | Freq: Once | ORAL | Status: DC | PRN

## 2022-09-16 MED ORDER — SODIUM CHLORIDE 0.9 % IV SOLN
2.0000 g | INTRAVENOUS | Status: DC
  Administered 2022-09-16 – 2022-09-19 (×5): 2 g via INTRAVENOUS
  Filled 2022-09-16 (×4): qty 20

## 2022-09-16 NOTE — Progress Notes (Signed)
Physical Therapy Treatment Patient Details Name: Linda Chase MRN: [id number] DOB: 1955/09/18 Today's Date: 09/16/2022   History of Present Illness Daniely Ahuja is a 67 y.o. female who presented to [facility] ED with complaints of lower back pain however due to respiratory difficulty placed on biPap and then transferred to [facility]. HR also in 130s in a-fib. PMH: HTN, paroxysmal a-fib on chronic anticoagulation, DM II, cerebral aneurysm, and morbid obesity    PT Comments  Pt received in supine and agreeable to session once awoken. Pt requiring multiple cues to orient pt to time of day and for safety throughout session. Pt requesting to use the [facility] at the beginning of the session and is able to pivot with CGA without AD. Pt requesting therapist to step out for pt to perform pericare, however pt attempting to complete while standing without AD and demonstrating increased instability.  Pt able to stand at the sink for hand hygiene and to brush her teeth, however requires heavy UE support. Pt able to tolerate gait trial in the hallway, but remains limited by impaired activity tolerance. Pt continues to benefit from PT services to progress toward functional mobility goals.    If plan is discharge home, recommend the following: A lot of help with walking and/or transfers;Help with stairs or ramp for entrance;Assist for transportation;Assistance with cooking/housework;A lot of help with bathing/dressing/bathroom   Can travel by private vehicle     No  Equipment Recommendations  Rolling walker (2 wheels)    Recommendations for Other Services       Precautions / Restrictions Precautions Precautions: Fall Precaution Comments: watch SpO2 and HR Restrictions Weight Bearing Restrictions: No     Mobility  Bed Mobility Overal bed mobility: Needs Assistance Bed Mobility: Supine to Sit     Supine to sit: Min assist, HOB elevated, Used rails     General bed mobility comments: Min  A for RLE advancement and trunk elevation    Transfers Overall transfer level: Needs assistance Equipment used: Rolling walker (2 wheels), None Transfers: Sit to/from Stand, Bed to chair/wheelchair/BSC Sit to Stand: Contact guard assist           General transfer comment: STS from EOB and BSC with CGA for safety. Step pivot from EOB to [facility] without AD with CGA for safety, but no LOB.    Ambulation/Gait Ambulation/Gait assistance: Contact guard assist Gait Distance (Feet): 150 Feet Assistive device: Rolling walker (2 wheels) Gait Pattern/deviations: Step-through pattern, Decreased stride length, Trunk flexed Gait velocity: dec     General Gait Details: Pt demonstrating short step-through pattern with flexed trunk despite cues. Pt able to improve upright posture during standing rest breaks.      Balance Overall balance assessment: Needs assistance Sitting-balance support: Feet supported, No upper extremity supported Sitting balance-Leahy Scale: Good Sitting balance - Comments: sitting EOB   Standing balance support: Bilateral upper extremity supported, During functional activity, No upper extremity supported Standing balance-Leahy Scale: Poor Standing balance comment: with RW support during ambulation. Pt requires UE support to static stand for hygiene tasks                            Cognition Arousal: Alert Behavior During Therapy: [facility] for tasks assessed/performed Overall Cognitive Status: No family/caregiver present to determine baseline cognitive functioning  General Comments: Cues for safety required throughout        Exercises      General Comments        Pertinent Vitals/Pain Pain Assessment Pain Assessment: 0-10 Pain Score: 8  Pain Location: R trunk Pain Descriptors / Indicators: Discomfort, Grimacing Pain Intervention(s): Limited activity within patient's tolerance, Monitored during session,  Repositioned     PT Goals (current goals can now be found in the care plan section) Progress towards PT goals: Progressing toward goals    Frequency    Min 1X/week      PT Plan         AM-PAC PT "6 Clicks" Mobility   Outcome Measure  Help needed turning from your back to your side while in a flat bed without using bedrails?: A Little Help needed moving from lying on your back to sitting on the side of a flat bed without using bedrails?: A Lot Help needed moving to and from a bed to a chair (including a wheelchair)?: A Little Help needed standing up from a chair using your arms (e.g., wheelchair or bedside chair)?: A Little Help needed to walk in hospital room?: A Little Help needed climbing 3-5 steps with a railing? : Total 6 Click Score: 15    End of Session Equipment Utilized During Treatment: Oxygen Activity Tolerance: Patient tolerated treatment well Patient left: with call bell/phone within reach;in bed Nurse Communication: Mobility status PT Visit Diagnosis: Unsteadiness on feet (R26.81);Other abnormalities of gait and mobility (R26.89);Pain     Time: 1420-1447 PT Time Calculation (min) (ACUTE ONLY): 27 min  Charges:    $Gait Training: 8-22 mins $Therapeutic Activity: 8-22 mins PT General Charges $$ ACUTE PT VISIT: 1 Visit                     Johny Shock, PTA Acute Rehabilitation Services Secure Chat Preferred  [phone]    Johny Shock 09/16/2022, 3:00 PM

## 2022-09-16 NOTE — Progress Notes (Signed)
ID brief note  Remains afebrile Cr 1.05  Underwent IR guided disc aspiration. Cx with no organisms in gram stain. Would be still helpful to obtain MRL T spine as more sensitive test if we can confirm his aneurysm clips are MRI compatible. Primary team has been working to get records from OSH  Fu IR cultures  Following peripherally   Odette Fraction, MD Infectious Disease Physician [facility] for Infectious Disease [address] [place], [place] [address] Phone: [phone]  Fax: [phone]'

## 2022-09-16 NOTE — Progress Notes (Signed)
CCC Pre-op Review  Pre-op checklist:  done  NPO:   yes  Labs: h/h 9.7 / 34.7  Consent:  signed  H&P:  done  Vitals:  hr 70's  r 18  99/57   low sats 80's on o2  O2 requirements:  2lnc  for low sats   MAR/PTA review:   IV:   Floor nurse name:    Additional info:  DM  insulin given this am  Toprol XL 100mg   @0706 

## 2022-09-16 NOTE — Progress Notes (Signed)
  Progress Note   Patient: Linda Chase ION:[id number] DOB: 03-29-1955 DOA: 09/06/2022     10 DOS: the patient was seen and examined on 09/16/2022 at 10:20AM      Brief hospital course: Mrs. Frangione is a 67 y.o. F with MO, OSA not on CPAP, pAF on Eliquis, COPD, DM, HTN, hx cerebral aneurysm s/p clipping in 2006 in [place] who presented with over a month of progressive mid back pain, now severe.         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  Gram stain negative - Defer starting antibiotics to ID - Resume Eliquis tomorrow - Defer PICC line until antibiotic regimen is determined    Acute on chronic heart failure with preserved ejection fraction (HFpEF) (HCC) Acute on chronic cor pulmonale Pulmonary hypertension 2 L of urine output yesterday, net -1 L, 13.4 L on admission.  Creatinine stable. - Continue IV Lasix - Strict ins and outs, daily weights, monitor BMP daily  - Supplemental O2 during the day, BiPAP at night   Paroxysmal atrial fibrillation with RVR (HCC) In the flutter overnight but rates controlled -Continue amiodarone and metoprolol - Resume Eliquis tomorrow  OSA (obstructive sleep apnea) - BiPAP QHS  Coronary artery disease involving native coronary artery of native heart without angina pectoris -Continue atorvastatin and metoprolol   Hypertension BP soft -Continue metoprolol, furosemide - Hold home HCTZ and amlodipine  Diabetes mellitus (HCC) Glucose controlled - Continue SS correction insulin          Subjective: Overall feeling better, less right knee pain because of weight loss.  Not complaining of back pain as much.  No fever, no confusion.     Physical Exam: BP 105/72 (BP Location: Left Arm)   Pulse 67   Temp 97.8 F (36.6 C)   Resp (!) 29   Ht 5\' 4"  (1.626 m)   Wt 133 kg   SpO2 91%   BMI 50.33 kg/m   Adult female, lying in bed, interactive and appropriate Heart rate is I  think regular, although it is nearly inaudible, edema is still pitting, improving overall Respirations seems easy and unlabored, lung sounds diminished Attention normal, affect blunted, judgment and insight appear normal    Data Reviewed: Basic metabolic panel shows stable creatinine 1.05, potassium 3.6  Family Communication: None    Disposition: Status is: Inpatient Patient admitted for back pain and CHF  Will need likely a few more days diuresis, in the meantime, we will be evaluating for long term antibiotics for suspected vertebral osteomyelitis        Author: Alberteen Sam, MD 09/16/2022 10:36 AM  For on call review [email or web address].

## 2022-09-16 NOTE — Plan of Care (Signed)

## 2022-09-16 NOTE — H&P (Signed)
Referring Physician(s): Dr Pilar Grammes  Supervising Physician: Baldemar Lenis  Patient Status:  Linda Chase - In-pt  Chief Complaint:  Back pain T8-T9 discitis  Subjective: FULL Code status per pt Scheduled for Thoracic 8-Thoracic 9 disc aspiration with anesthesia today Attempted procedure 2 days ago in IR--- unable to tolerate Unable to even get to table into a position that was ina any way comfortable for pt; secondary pain and she felt she "wouldn't be able to breathe" Now with anesthesia She is agreeable  Allergies: Hydrocodone  Medications: Prior to Admission medications   Medication Sig Start Date End Date Taking? Authorizing Provider  albuterol (VENTOLIN HFA) 108 (90 Base) MCG/ACT inhaler Inhale 1-2 puffs into the lungs as needed for wheezing or shortness of breath. 03/18/22  Yes [provider]  amLODipine (NORVASC) 5 MG tablet Take 1 tablet (5 mg total) by mouth daily. Patient taking differently: Take 5 mg by mouth every evening. 02/05/12  Yes Tat, Onalee Hua, MD  apixaban (ELIQUIS) 5 MG TABS tablet Take 5 mg by mouth 2 (two) times daily.   Yes [provider]  atorvastatin (LIPITOR) 20 MG tablet Take 20 mg by mouth daily.   Yes [provider]  Calcium-Phosphorus-Vitamin D (CALCIUM/VITAMIN D3/ADULT GUMMY PO) GUMMY VITAMIN D 2000 06/15/17  Yes [provider]  cholecalciferol (VITAMIN D) 1000 units tablet Take 2,000 Units by mouth daily. Gummies   Yes [provider]  furosemide (LASIX) 40 MG tablet Take 40 mg by mouth daily. 06/23/22  Yes [provider]  gabapentin (NEURONTIN) 300 MG capsule Take 300 mg by mouth at bedtime.   Yes [provider]  hydrochlorothiazide (HYDRODIURIL) 25 MG tablet Take 25 mg by mouth every morning. 07/31/15  Yes [provider]  JANUVIA 25 MG tablet Take 25 mg by mouth daily. 06/23/22  Yes [provider]  metFORMIN (GLUCOPHAGE) 1000 MG tablet Take 1,000 mg by  mouth 2 (two) times daily with a meal.   Yes [provider]  metoprolol succinate (TOPROL-XL) 25 MG 24 hr tablet Take 25 mg by mouth 2 (two) times daily. 06/23/22  Yes [provider]  SPIRIVA HANDIHALER 18 MCG inhalation capsule Place 18 mcg into inhaler and inhale daily. 06/23/22  Yes [provider]  potassium chloride (KLOR-CON M) 10 MEQ tablet Take 10 mEq by mouth daily. Patient not taking: Reported on 09/06/2022 03/18/22   [provider]     Vital Signs: BP 104/71 (BP Location: Right Wrist)   Pulse 73   Temp 99.1 F (37.3 C) (Oral)   Resp 17   Ht 5\' 4"  (1.626 m)   Wt 293 lb 3.4 oz (133 kg)   SpO2 94%   BMI 50.33 kg/m   Physical Exam Vitals reviewed.  HENT:     Mouth/Throat:     Mouth: Mucous membranes are moist.  Cardiovascular:     Rate and Rhythm: Normal rate and regular rhythm.     Heart sounds: Normal heart sounds.  Pulmonary:     Effort: Pulmonary effort is normal.     Breath sounds: Normal breath sounds. No wheezing.  Abdominal:     Palpations: Abdomen is soft.     Tenderness: There is no abdominal tenderness.  Musculoskeletal:        General: Normal range of motion.  Skin:    General: Skin is warm.  Neurological:     Mental Status: She is alert and oriented to person, place, and time.  Psychiatric:  Behavior: Behavior normal.     Imaging: IR THORACIC DISC ASPIRATION W/IMG GUIDE  Result Date: 09/15/2022 INDICATION: Zanasia Deer is a 67 y.o. female with PMH significant arthritis, atrial fibrillation, cerebral aneurysm x2, depression, diabetes mellitus, and hypertension being seen today in relation to suspected discitis/osteomyelitis. Patient was admitted to [facility] on 09/06/22 after being found to be in acute respiratory failure. CT on 09/06/22 noted findings of pulmonary nodules, which prompted follow-up CT Chest on 8/28 which revealed concern for discitis/osteomyelitis at T8-T9. MRI is unable to be obtained due to  aneurysm clips of unknown MR compatibility. IR was subsequently consulted for image-guided T8-T9 disc aspiration. EXAM: FLUOROSCOPY GUIDED T8-T9 DISC ASPIRATION MEDICATIONS: None. ANESTHESIA/SEDATION: None. COMPLICATIONS: None immediate. FLUOROSCOPY: No radiation exposure performed. PROCEDURE: Informed written consent was obtained from the patient after a thorough discussion of the procedural risks, benefits and alternatives. All questions were addressed. Patient was transferred to the angiography table. However, she was unable to tolerate lying on her stomach for the intervention. Procedure was, therefore, aborted. IMPRESSION: Patient unable to tolerate positioning in the procedural table. Procedure was aborted. Electronically Signed   By: Baldemar Lenis M.D.   On: 09/15/2022 14:09    Labs:  CBC: Recent Labs    09/12/22 0234 09/13/22 0203 09/14/22 0229 09/15/22 0935  WBC 12.0* 12.2* 11.7* 10.2  HGB 10.4* 10.4* 10.0* 9.7*  HCT 36.4 36.1 34.5* 34.7*  PLT 298 350 353 375    COAGS: No results for input(s): "INR", "APTT" in the last 8760 hours.  BMP: Recent Labs    09/13/22 0203 09/14/22 0229 09/15/22 0935 09/16/22 0222  NA 135 137 137 136  K 3.7 3.9 3.9 3.6  CL 90* 91* 89* 90*  CO2 37* 37* 39* 38*  GLUCOSE 186* 212* 222* 172*  BUN 13 14 15 19   CALCIUM 8.7* 8.9 8.7* 8.4*  CREATININE 0.94 1.03* 1.00 1.05*  GFRNONAA >60 60* >60 59*    LIVER FUNCTION TESTS: Recent Labs    09/11/22 0245 09/12/22 0234 09/13/22 0203 09/14/22 0229  BILITOT 0.5 0.5 0.6 0.5  AST 21 21 21 19   ALT 15 17 18 15   ALKPHOS 57 58 66 54  PROT 7.0 7.7 8.1 7.5  ALBUMIN 2.5* 2.7* 2.8* 2.6*    Assessment and Plan:  Scheduled in IR for T8-T9 disc aspiration with anesthesia Risks and benefits of Thoracic 8- Thoracic 9 disc aspiration was discussed with the patient and/or patient's family including, but not limited to bleeding, infection, damage to adjacent structures or low yield requiring  additional tests.  All of the questions were answered and there is agreement to proceed. Consent signed and in chart.  Electronically Signed: Robet Leu, PA-C 09/16/2022, 6:28 AM   I spent a total of 15 Minutes at the the patient's bedside AND on the patient's hospital floor or unit, greater than 50% of which was counseling/coordinating care for T8-T9 disc aspiration

## 2022-09-16 NOTE — Transfer of Care (Signed)
Immediate Anesthesia Transfer of Care Note  Patient: Linda Chase  Procedure(s) Performed: IR thoracic disc aspiration  Patient Location: PACU  Anesthesia Type:MAC  Level of Consciousness: awake, alert , and oriented  Airway & Oxygen Therapy: Patient Spontanous Breathing and Patient connected to nasal cannula oxygen  Post-op Assessment: Report given to RN and Post -op Vital signs reviewed and stable  Post vital signs: Reviewed and stable  Last Vitals:  Vitals Value Taken Time  BP 103/73 09/16/22 0900  Temp    Pulse 66 09/16/22 0900  Resp 19 09/16/22 0900  SpO2 95 % 09/16/22 0900  Vitals shown include unfiled device data.  Last Pain:  Vitals:   09/16/22 0820  TempSrc:   PainSc: 0-No pain      Patients Stated Pain Goal: 2 (09/13/22 1609)  Complications: No notable events documented.

## 2022-09-16 NOTE — Progress Notes (Signed)
Urine on patient floor. Unmeasured occurrence documented. Patient cleaned up and placed in bed with purewick. Agreeable at this time.

## 2022-09-16 NOTE — Progress Notes (Signed)
Patient purewick leaked, bed pad soaked. 1 unmeasured occurrence documented on flowsheet. Patient requesting to get in chair, pt placed in chair and bedside commode set up.  Chair alarm on and call bell within reach.

## 2022-09-16 NOTE — Progress Notes (Signed)
   09/15/22 2130  BiPAP/CPAP/SIPAP  BiPAP/CPAP/SIPAP Pt Type Adult  BiPAP/CPAP/SIPAP SERVO  Reason BIPAP/CPAP not in use Non-compliant  BiPAP/CPAP /SiPAP Vitals  SpO2 97 %

## 2022-09-16 NOTE — Plan of Care (Signed)
  Problem: Education: Goal: Knowledge of General Education information will improve Description: Including pain rating scale, medication(s)/side effects and non-pharmacologic comfort measures Outcome: Progressing   Problem: Clinical Measurements: Goal: Diagnostic test results will improve Outcome: Progressing Goal: Respiratory complications will improve Outcome: Progressing Goal: Cardiovascular complication will be avoided Outcome: Progressing   Problem: Activity: Goal: Risk for activity intolerance will decrease Outcome: Progressing   Problem: Nutrition: Goal: Adequate nutrition will be maintained Outcome: Progressing   Problem: Coping: Goal: Level of anxiety will decrease Outcome: Progressing   Problem: Pain Managment: Goal: General experience of comfort will improve Outcome: Progressing   Problem: Safety: Goal: Ability to remain free from injury will improve Outcome: Progressing

## 2022-09-16 NOTE — Sedation Documentation (Signed)
Patient placed on the table in supine position.  Anesthesia to administer medication and monitor patient vitals.

## 2022-09-16 NOTE — Plan of Care (Signed)
  Problem: Clinical Measurements: Goal: Cardiovascular complication will be avoided Outcome: Progressing   Problem: Activity: Goal: Risk for activity intolerance will decrease Outcome: Progressing   Problem: Nutrition: Goal: Adequate nutrition will be maintained Outcome: Progressing   Problem: Coping: Goal: Level of anxiety will decrease Outcome: Progressing   Problem: Pain Managment: Goal: General experience of comfort will improve Outcome: Progressing   Problem: Safety: Goal: Ability to remain free from injury will improve Outcome: Progressing   

## 2022-09-16 NOTE — Anesthesia Postprocedure Evaluation (Signed)
Anesthesia Post Note  Patient: Linda Chase  Procedure(s) Performed: IR thoracic disc aspiration     Patient location during evaluation: PACU Anesthesia Type: MAC Level of consciousness: awake and alert Pain management: pain level controlled Vital Signs Assessment: post-procedure vital signs reviewed and stable Respiratory status: spontaneous breathing, nonlabored ventilation, respiratory function stable and patient connected to nasal cannula oxygen Cardiovascular status: stable and blood pressure returned to baseline Postop Assessment: no apparent nausea or vomiting Anesthetic complications: no   No notable events documented.  Last Vitals:  Vitals:   09/16/22 0915 09/16/22 1121  BP: 105/72 126/81  Pulse: 67 69  Resp: (!) 29 18  Temp: 36.6 C 36.6 C  SpO2: 91% 96%    Last Pain:  Vitals:   09/16/22 1121  TempSrc: Oral  PainSc:                  Mariann Barter

## 2022-09-16 NOTE — Procedures (Signed)
INTERVENTIONAL NEURORADIOLOGY BRIEF POSTPROCEDURE NOTE  FLUOROSCOPY GUIDED DISC ASPIRATION  Attending physician: Dr. Baldemar Lenis  Diagnosis: T8-9 discitis  Access site: Percutaneous left extrapedicular.  Anesthesia: IR sedation: Local anesthesia + MAC  Complications: None.  Estimated blood loss: None.  Specimen:  2 mL disc aspirate.  Findings: Erosion of the T8-9 endplates, consistent with the diagnosis of discitis. 2 mL of blood tinged aspirate sent to lab.  The patient tolerated the procedure well without incident or complication and is in stable condition.

## 2022-09-16 NOTE — Anesthesia Preprocedure Evaluation (Addendum)
Anesthesia Evaluation  Patient identified by MRN, date of birth, ID band Patient awake    Reviewed: Allergy & Precautions, NPO status , Patient's Chart, lab work & pertinent test results, reviewed documented beta blocker date and time   History of Anesthesia Complications Negative for: history of anesthetic complications  Airway Mallampati: IV  TM Distance: >3 FB Neck ROM: Limited    Dental  (+) Poor Dentition   Pulmonary sleep apnea , COPD, Current Smoker Admitted for acute respiratory failure, now improved with diuresis    + decreased breath sounds      Cardiovascular Exercise Tolerance: Poor hypertension, pulmonary hypertension+ CAD and +CHF  + dysrhythmias Atrial Fibrillation  Rhythm:Regular Rate:Normal     Neuro/Psych  PSYCHIATRIC DISORDERS  Depression    Cerebral aneurysm s/p clipping    GI/Hepatic ,neg GERD  ,,(+) neg Cirrhosis        Endo/Other  diabetes, Type 2  Morbid obesity  Renal/GU      Musculoskeletal  (+) Arthritis ,  Thoracic osteomyelitis   Abdominal   Peds  Hematology   Anesthesia Other Findings   Reproductive/Obstetrics                             Anesthesia Physical Anesthesia Plan  ASA: 3  Anesthesia Plan: MAC   Post-op Pain Management:    Induction: Intravenous  PONV Risk Score and Plan: 1 and Propofol infusion  Airway Management Planned:   Additional Equipment:   Intra-op Plan:   Post-operative Plan:   Informed Consent: I have reviewed the patients History and Physical, chart, labs and discussed the procedure including the risks, benefits and alternatives for the proposed anesthesia with the patient or authorized representative who has indicated his/her understanding and acceptance.     Dental advisory given  Plan Discussed with: CRNA  Anesthesia Plan Comments:         Anesthesia Quick Evaluation

## 2022-09-17 ENCOUNTER — Encounter (HOSPITAL_COMMUNITY): Payer: Self-pay | Admitting: Neuroradiology

## 2022-09-17 DIAGNOSIS — I5033 Acute on chronic diastolic (congestive) heart failure: Secondary | ICD-10-CM | POA: Diagnosis not present

## 2022-09-17 DIAGNOSIS — J9602 Acute respiratory failure with hypercapnia: Secondary | ICD-10-CM | POA: Diagnosis not present

## 2022-09-17 DIAGNOSIS — I2781 Cor pulmonale (chronic): Secondary | ICD-10-CM | POA: Diagnosis not present

## 2022-09-17 DIAGNOSIS — I50813 Acute on chronic right heart failure: Secondary | ICD-10-CM | POA: Diagnosis not present

## 2022-09-17 LAB — CBC
HCT: 34.2 % — ABNORMAL LOW (ref 36.0–46.0)
Hemoglobin: 9.7 g/dL — ABNORMAL LOW (ref 12.0–15.0)
MCH: 23.7 pg — ABNORMAL LOW (ref 26.0–34.0)
MCHC: 28.4 g/dL — ABNORMAL LOW (ref 30.0–36.0)
MCV: 83.6 fL (ref 80.0–100.0)
Platelets: 357 10*3/uL (ref 150–400)
RBC: 4.09 MIL/uL (ref 3.87–5.11)
RDW: 14.6 % (ref 11.5–15.5)
WBC: 8.7 10*3/uL (ref 4.0–10.5)
nRBC: 0 % (ref 0.0–0.2)

## 2022-09-17 LAB — BASIC METABOLIC PANEL
Anion gap: 9 (ref 5–15)
BUN: 17 mg/dL (ref 8–23)
CO2: 36 mmol/L — ABNORMAL HIGH (ref 22–32)
Calcium: 8.2 mg/dL — ABNORMAL LOW (ref 8.9–10.3)
Chloride: 92 mmol/L — ABNORMAL LOW (ref 98–111)
Creatinine, Ser: 1.06 mg/dL — ABNORMAL HIGH (ref 0.44–1.00)
GFR, Estimated: 58 mL/min — ABNORMAL LOW (ref >60)
Glucose, Bld: 181 mg/dL — ABNORMAL HIGH (ref 70–99)
Potassium: 3.7 mmol/L (ref 3.5–5.1)
Sodium: 137 mmol/L (ref 135–145)

## 2022-09-17 LAB — GLUCOSE, CAPILLARY
Glucose-Capillary: 149 mg/dL — ABNORMAL HIGH (ref 70–99)
Glucose-Capillary: 181 mg/dL — ABNORMAL HIGH (ref 70–99)
Glucose-Capillary: 162 mg/dL — ABNORMAL HIGH (ref 70–99)
Glucose-Capillary: 193 mg/dL — ABNORMAL HIGH (ref 70–99)

## 2022-09-17 NOTE — Progress Notes (Signed)
   Patient Name: Linda Chase Date of Encounter: 09/17/2022 [facility] Cardiologist: Dietrich Pates, MD   Interval Summary  .    No increase in shortness of breath no chest pain.  Vital Signs .    Vitals:   09/17/22 0020 09/17/22 0040 09/17/22 0312 09/17/22 0731  BP:   96/73 103/87  Pulse:   80 82  Resp:   20 14  Temp:   98.4 F (36.9 C) 98.6 F (37 C)  TempSrc:   Oral Oral  SpO2:  100% 96% 93%  Weight: 131.6 kg     Height:        Intake/Output Summary (Last 24 hours) at 09/17/2022 0831 Last data filed at 09/17/2022 0731 Gross per 24 hour  Intake 934.06 ml  Output 4055 ml  Net -3120.94 ml      09/17/2022   12:20 AM 09/16/2022    4:21 AM 09/15/2022    3:28 AM  Last 3 Weights  Weight (lbs) 290 lb 2 oz 293 lb 3.4 oz 294 lb 5 oz  Weight (kg) 131.6 kg 133 kg 133.5 kg      Telemetry/ECG    Atrial fibrillation heart rate in the 70s- Personally Reviewed  Echo-EF 49% pulmonary pressures 45 mmHg aorta 42 mm  Physical Exam .   GEN: No acute distress.   Neck: No JVD Cardiac: Irregular irregular, no murmurs, rubs, or gallops.  Respiratory: Clear to auscultation bilaterally. GI: Soft, nontender, non-distended  MS: No edema  Assessment & Plan .     Persistent atrial fibrillation - Lets go ahead and decrease the amiodarone to 200 mg twice a day for a total of 2 weeks and then 200 mg a day thereafter. -Heart rates are under good control. - Continue with metoprolol 100 mg a day.  Acute on chronic systolic/diastolic heart failure - EF mildly reduced 49% - Continue with furosemide 80 mg IV twice daily.  Better I's and O's, pure wick.  Creatinine 1.06 potassium 3.7 output 3.3 L yesterday.  Net.  Blood pressure 103/87.  Weight 290 pounds down from 327 pounds.  Chronic anticoagulation - Continue with Eliquis 5 mg twice a day  Hyperlipidemia continue with atorvastatin 20 mg a day  Discitis - Ceftriaxone daptomycin per ID recommendations. - Status post aspiration  from interventional radiology.  Eliquis previously on hold has been restarted.  Morbid obesity - Continue to encourage weight loss  Secondary pulmonary hypertension - As a result of morbid obesity, pressures 45 mmHg  Dilated aorta - 42 mm mild.  Continue with good blood pressure control.  Monitor severe TR/OSA - RV size is enlarged, morbid obesity, OSA.  Continue to monitor as outpatient.  BiPAP here.  Weaned to supplemental oxygen.  Coronary artery disease - Noted on CT scan with calcification of the LAD - No anginal symptoms.  Continue with statin, beta-blocker for goal-directed medical therapy.  For questions or updates, please contact  [facility] Please consult [email or web address] for contact info under        Signed, Donato Schultz, MD

## 2022-09-17 NOTE — Progress Notes (Signed)
  Progress Note   Patient: Linda Chase [id number] DOB: 24-Apr-1955 DOA: 09/06/2022     11 DOS: the patient was seen and examined on 09/17/2022 at 9:59 AM      Brief hospital course: Linda Chase is a 67 y.o. F with MO, OSA not on CPAP, pAF on Eliquis, COPD, DM, HTN, hx cerebral aneurysm s/p clipping in 2006 in [place] who presented with over a month of progressive mid back pain, now severe.   Subsequently developed somnolence, respiratory failure due to cor pulmonale.    During workup for respiratory disease, incidentally found to have a thoracic endplate changes concerning for osteomyelitis.      Assessment and Plan: * Acute on chronic respiratory failure with hypercapnia (HCC) Obesity hypoventilation syndrome Obstructive sleep apnea Pulmonary hypertension Acute on chronic Cor Pulmonale Acute on chronic heart failure with preserved ejection fraction (HFpEF) (HCC) Net -3 L yesterday, 17 and half liters on admission.  Creatinine stable.  Still feels swollen. 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) Aspirate on 9/5, Gram stain negative, culture negative to date - Empiric antibiotics directed by infectious disease - Follow aspirate culture - In the long run, I actually favor MRI imaging, and monitoring imaging as she has been relatively asymptomatic and I am not sure what degree of certainty we have that this is infectious      Paroxysmal atrial fibrillation with RVR (HCC) Rates controlled - Continue amiodarone metoprolol - Resume Eliquis this morning   OSA (obstructive sleep apnea) - BiPAP QHS     Coronary artery disease involving native coronary artery of native heart without angina pectoris - Continue atorvastatin and metoprolol     Hypertension Blood pressure remains relatively low - Continue metoprolol, furosemide - Hold HCTZ,  amlodipine  Diabetes mellitus (HCC) Glucose controlled - Continue sliding scale correction insulin - Continue gabapentin - Hold metformin and Januvia          Subjective: Patient feeling a lot better, still feels swollen, no fever, no back pain, no confusion     Physical Exam: BP (!) 129/97 (BP Location: Right Wrist)   Pulse 82   Temp 98.2 F (36.8 C) (Oral)   Resp (!) 22   Ht 5\' 4"  (1.626 m)   Wt 132.6 kg   SpO2 96%   BMI 50.18 kg/m   Adult female, obese, sitting up in bed, interactive and appropriate Heart rate controlled, difficulty hearing, edema still pitting but improving overall Respirations easy and unlabored, lung sounds normal Attention normal, affect blunted but at baseline, judgment and insight appear normal    Data Reviewed: Basic metabolic panel shows stable creatinine and potassium Cultures no growth to date       Disposition: Status is: Inpatient         Author: Alberteen Sam, MD 09/17/2022 5:17 PM  For on call review [email or web address].

## 2022-09-17 NOTE — Progress Notes (Signed)
Patient has refused Bipap. States she will try to wear it if she goes to sleep tonight. Patient states she will call if she decides to utilize tonight

## 2022-09-17 NOTE — Progress Notes (Signed)
Mobility Specialist Progress Note:   09/17/22 1041  Mobility  Activity Ambulated with assistance in hallway  Level of Assistance Contact guard assist, steadying assist  Assistive Device Front wheel walker  Distance Ambulated (ft) 280 ft  Activity Response Tolerated well  Mobility Referral Yes  $Mobility charge 1 Mobility  Mobility Specialist Start Time (ACUTE ONLY) 1005  Mobility Specialist Stop Time (ACUTE ONLY) 1030  Mobility Specialist Time Calculation (min) (ACUTE ONLY) 25 min   Pre Mobility: 84 HR , 90% SpO2 RA During Mobility: 113 HR  Post Mobility: 96 HR , 91% SpO2 RA  Pt received in chair, agreeable to mobility. Prior to ambulation, pt requesting to use BSC. Void successful. SB assist to stand and pivot. Pt able to ambulate in hallway with CG for safety. Unable to get accurate SpO2 during session but pt asymptomatic on RA. Pt returned to room and left in chair with call bell in reach and all needs met. Chair alarm on.   Leory Plowman  Mobility Specialist Please contact via [employer] [facility] office at [phone]

## 2022-09-17 NOTE — Progress Notes (Signed)
Pt got up to use bedside commode. Urinated partial in floor. Pt continued to continue to get ot beside commode.

## 2022-09-17 NOTE — Progress Notes (Signed)
Patient purewick leaked.  1 unmeasured occurrence documented in flowsheet.  Patient placed in chair per request and BSC set up. Call bell within reach and chair alarm on.

## 2022-09-17 NOTE — Plan of Care (Signed)
°  Problem: Education: °Goal: Knowledge of General Education information will improve °Description: Including pain rating scale, medication(s)/side effects and non-pharmacologic comfort measures °Outcome: Progressing °  °Problem: Health Behavior/Discharge Planning: °Goal: Ability to manage health-related needs will improve °Outcome: Progressing °  °Problem: Clinical Measurements: °Goal: Ability to maintain clinical measurements within normal limits will improve °Outcome: Progressing °Goal: Will remain free from infection °Outcome: Progressing °Goal: Diagnostic test results will improve °Outcome: Progressing °  °Problem: Nutrition: °Goal: Adequate nutrition will be maintained °Outcome: Progressing °  °Problem: Coping: °Goal: Level of anxiety will decrease °Outcome: Progressing °  °Problem: Pain Managment: °Goal: General experience of comfort will improve °Outcome: Progressing °  °Problem: Safety: °Goal: Ability to remain free from injury will improve °Outcome: Progressing °  °Problem: Skin Integrity: °Goal: Risk for impaired skin integrity will decrease °Outcome: Progressing °  °

## 2022-09-18 DIAGNOSIS — I50813 Acute on chronic right heart failure: Secondary | ICD-10-CM | POA: Diagnosis not present

## 2022-09-18 DIAGNOSIS — J9602 Acute respiratory failure with hypercapnia: Secondary | ICD-10-CM | POA: Diagnosis not present

## 2022-09-18 DIAGNOSIS — I5033 Acute on chronic diastolic (congestive) heart failure: Secondary | ICD-10-CM | POA: Diagnosis not present

## 2022-09-18 DIAGNOSIS — I2781 Cor pulmonale (chronic): Secondary | ICD-10-CM | POA: Diagnosis not present

## 2022-09-18 LAB — CBC
HCT: 38.5 % (ref 36.0–46.0)
Hemoglobin: 11 g/dL — ABNORMAL LOW (ref 12.0–15.0)
MCH: 23.9 pg — ABNORMAL LOW (ref 26.0–34.0)
MCHC: 28.6 g/dL — ABNORMAL LOW (ref 30.0–36.0)
MCV: 83.7 fL (ref 80.0–100.0)
Platelets: 292 10*3/uL (ref 150–400)
RBC: 4.6 MIL/uL (ref 3.87–5.11)
RDW: 14.8 % (ref 11.5–15.5)
WBC: 9.1 10*3/uL (ref 4.0–10.5)
nRBC: 0 % (ref 0.0–0.2)

## 2022-09-18 LAB — MAGNESIUM: Magnesium: 2.2 mg/dL (ref 1.7–2.4)

## 2022-09-18 LAB — BASIC METABOLIC PANEL
Anion gap: 11 (ref 5–15)
BUN: 18 mg/dL (ref 8–23)
CO2: 35 mmol/L — ABNORMAL HIGH (ref 22–32)
Calcium: 8.6 mg/dL — ABNORMAL LOW (ref 8.9–10.3)
Chloride: 92 mmol/L — ABNORMAL LOW (ref 98–111)
Creatinine, Ser: 1.13 mg/dL — ABNORMAL HIGH (ref 0.44–1.00)
GFR, Estimated: 54 mL/min — ABNORMAL LOW (ref >60)
Glucose, Bld: 172 mg/dL — ABNORMAL HIGH (ref 70–99)
Potassium: 3.4 mmol/L — ABNORMAL LOW (ref 3.5–5.1)
Sodium: 138 mmol/L (ref 135–145)

## 2022-09-18 LAB — GLUCOSE, CAPILLARY
Glucose-Capillary: 155 mg/dL — ABNORMAL HIGH (ref 70–99)
Glucose-Capillary: 276 mg/dL — ABNORMAL HIGH (ref 70–99)
Glucose-Capillary: 97 mg/dL (ref 70–99)
Glucose-Capillary: 177 mg/dL — ABNORMAL HIGH (ref 70–99)

## 2022-09-18 MED ORDER — POTASSIUM CHLORIDE CRYS ER 20 MEQ PO TBCR
40.0000 meq | EXTENDED_RELEASE_TABLET | ORAL | Status: AC
  Administered 2022-09-18 (×2): 40 meq via ORAL
  Filled 2022-09-18 (×2): qty 2

## 2022-09-18 MED ORDER — POTASSIUM CHLORIDE CRYS ER 20 MEQ PO TBCR
40.0000 meq | EXTENDED_RELEASE_TABLET | Freq: Once | ORAL | Status: AC
  Administered 2022-09-18: 40 meq via ORAL
  Filled 2022-09-18: qty 2

## 2022-09-18 NOTE — Progress Notes (Signed)
  Progress Note   Patient: Linda Chase [id number] DOB: 1955-09-05 DOA: 09/06/2022     12 DOS: the patient was seen and examined on 09/18/2022 at 9:05 AM      Brief hospital course: Linda Chase is a 67 y.o. F with MO, OSA not on CPAP, pAF on Eliquis, COPD, DM, HTN, hx cerebral aneurysm s/p clipping in 2006 in [place] who presented with over a month of progressive mid back pain, now severe.   Working diagnoses are acute on chronic congestive heart failure cor pulmonale  Also being treated empirically for osteomyelitis of the spine      Assessment and Plan: *Acute on chronic congestive heart failure Acute on chronic cor pulmonale   Net -2.2 L yesterday, 20 L on admission.  Creatinine slightly up to 1.1, swelling seems better.  To me she still feels swollen. 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  This diagnosis rest completely on CT findings of endplate changes.  Aspiration on 9/5 which currently no growth.  Extensive effort was made to obtain records from her outside facility (Neurosurgeon Dr. Jeani Hawking with [facility] in [place], performed at [facility]) but although we have to obtain the op note from her surgery, we cannot find anything definitive about MRI compatibility of her aneurysm clips. - Defer empiric antibiotics to ID - Will discuss tagged white cell scan with ID    Paroxysmal atrial fibrillation with RVR (HCC) Rates remain controlled - Continue amiodarone, metoprolol - Continue Eliquis  OSA (obstructive sleep apnea) - BiPAP QHS     Coronary artery disease Hypertension Blood pressure relatively soft - Continue metoprolol, furosemide, atorvastatin - Hold home amlodipine and HCTZ  COPD (chronic obstructive pulmonary disease) (HCC) No active disease - Continue LAMA  Diabetes Glucose control - Continue sliding scale corrections - Hold metformin  and Januvia - Continue gabapentin          Subjective: Patient has pain in her left side, no back pain, no fever, breathing is better, swelling is overall slightly better       Physical Exam: BP (!) 111/56 (BP Location: Right Wrist)   Pulse 98   Temp 97.6 F (36.4 C) (Oral)   Resp 20   Ht 5\' 4"  (1.626 m)   Wt 130.2 kg   SpO2 93%   BMI 49.27 kg/m   Obese adult female, sitting up in recliner, blunted affect but interactive and appropriate RRR, no murmurs, still has brawny change, still has pitting below the knee Respiratory rate normal, lung sounds diminished overall but I cannot appreciate rales or wheezes Abdomen soft without tenderness palpation or guarding, I can appreciate Attention normal, affect blunted, judgment and insight appear normal    Data Reviewed: Discussed with infectious disease Creatinine slightly up to 1.1 Potassium 3.4  Family Communication: None    Disposition: Status is: Inpatient         Author: Alberteen Sam, MD 09/18/2022 11:09 AM  For on call review [email or web address].

## 2022-09-19 DIAGNOSIS — I2781 Cor pulmonale (chronic): Secondary | ICD-10-CM | POA: Diagnosis not present

## 2022-09-19 DIAGNOSIS — I5033 Acute on chronic diastolic (congestive) heart failure: Secondary | ICD-10-CM | POA: Diagnosis not present

## 2022-09-19 DIAGNOSIS — I50813 Acute on chronic right heart failure: Secondary | ICD-10-CM | POA: Diagnosis not present

## 2022-09-19 DIAGNOSIS — J9602 Acute respiratory failure with hypercapnia: Secondary | ICD-10-CM | POA: Diagnosis not present

## 2022-09-19 LAB — MAGNESIUM: Magnesium: 2.3 mg/dL (ref 1.7–2.4)

## 2022-09-19 LAB — BASIC METABOLIC PANEL
Anion gap: 9 (ref 5–15)
BUN: 19 mg/dL (ref 8–23)
CO2: 32 mmol/L (ref 22–32)
Calcium: 8.5 mg/dL — ABNORMAL LOW (ref 8.9–10.3)
Chloride: 97 mmol/L — ABNORMAL LOW (ref 98–111)
Creatinine, Ser: 1.17 mg/dL — ABNORMAL HIGH (ref 0.44–1.00)
GFR, Estimated: 51 mL/min — ABNORMAL LOW (ref >60)
Glucose, Bld: 185 mg/dL — ABNORMAL HIGH (ref 70–99)
Potassium: 3.9 mmol/L (ref 3.5–5.1)
Sodium: 138 mmol/L (ref 135–145)

## 2022-09-19 LAB — GLUCOSE, CAPILLARY
Glucose-Capillary: 144 mg/dL — ABNORMAL HIGH (ref 70–99)
Glucose-Capillary: 197 mg/dL — ABNORMAL HIGH (ref 70–99)
Glucose-Capillary: 117 mg/dL — ABNORMAL HIGH (ref 70–99)
Glucose-Capillary: 159 mg/dL — ABNORMAL HIGH (ref 70–99)

## 2022-09-19 LAB — BODY FLUID CULTURE W GRAM STAIN
Culture: NO GROWTH
Gram Stain: NONE SEEN

## 2022-09-19 MED ORDER — POTASSIUM CHLORIDE CRYS ER 20 MEQ PO TBCR
40.0000 meq | EXTENDED_RELEASE_TABLET | Freq: Two times a day (BID) | ORAL | Status: DC
  Administered 2022-09-19 – 2022-09-22 (×7): 40 meq via ORAL
  Filled 2022-09-19 (×7): qty 2

## 2022-09-19 NOTE — Progress Notes (Addendum)
Mobility Specialist Progress Note:   09/19/22 1530  Mobility  Activity Ambulated with assistance in hallway  Level of Assistance Modified independent, requires aide device or extra time  Assistive Device Front wheel walker  Distance Ambulated (ft) 350 ft  Activity Response Tolerated well  Mobility Referral Yes  $Mobility charge 1 Mobility  Mobility Specialist Start Time (ACUTE ONLY) 1503  Mobility Specialist Stop Time (ACUTE ONLY) 1519  Mobility Specialist Time Calculation (min) (ACUTE ONLY) 16 min   Pre Mobility: 84 HR  Post Mobility: 92 HR  Pt received in chair, agreeable to mobility. Pt denied any discomfort during ambulation, asymptomatic throughout. Pt returned to chair with call bell in reach and all needs met.  Leory Plowman  Mobility Specialist Please contact via [employer] office at [phone]

## 2022-09-19 NOTE — Progress Notes (Signed)
   09/19/22 0332  BiPAP/CPAP/SIPAP  BiPAP/CPAP/SIPAP SERVO  Reason BIPAP/CPAP not in use Non-compliant  Mask Type Full face mask

## 2022-09-19 NOTE — Progress Notes (Signed)
  Progress Note   Patient: Linda Chase [id number] DOB: 11-11-1955 DOA: 09/06/2022     13 DOS: the patient was seen and examined on 09/19/2022 at 10:00 AM      Brief hospital course: Linda Chase is a 67 y.o. F with MO, OSA not on CPAP, pAF on Eliquis, COPD, DM, HTN, hx cerebral aneurysm s/p clipping in 2006 in [place] who presented with over a month of progressive mid back pain, now severe.   Working diagnoses are acute on chronic congestive heart failure cor pulmonale  Also being treated empirically for osteomyelitis of the spine      Assessment and Plan: *Acute on chronic congestive heart failure Acute on chronic cor pulmonale   The -2.3 L yesterday, 22 L on admission.  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  There is probably still some diagnostic uncertainty whether this is infection or not - I am okay with empiric antibiotics managed by ID  -Possible tagged white cell scan tomorrow    Paroxysmal atrial fibrillation with RVR (HCC) Rate controlled - Continue amiodarone, metoprolol, Eliquis  OSA (obstructive sleep apnea) - BiPAP QHS  Coronary artery disease Hypertension Blood pressure is improving with diuresis - Continue metoprolol, furosemide, atorvastatin - Hold home amlodipine and HCTZ  COPD (chronic obstructive pulmonary disease) (HCC) No active disease - Continue LAMA  Diabetes Glucoses controlled - Continue sliding scale corrections - Continue gabapentin for neuropathy - Hold metformin and Januvia          Subjective: Back pain is relatively low, she has some left flank pain, no fever, no confusion, no respiratory symptoms       Physical Exam: BP 108/65 (BP Location: Right Arm)   Pulse 64   Temp 98 F (36.7 C) (Oral)   Resp 20   Ht 5\' 4"  (1.626 m)   Wt 129.6 kg   SpO2 94%   BMI 49.04 kg/m   Obese adult female, sitting  in recliner, ambulated to the toilet easily, interactive and appropriate RRR, no murmurs, brawny changes to the legs bilaterally, but pitting edema still present Respiratory rate normal, lungs clear without rales or wheezes Attention normal, affect blunted, limited, constrained affect, gait slightly antalgic but strength appears symmetric      Data Reviewed: Discussed with cardiology Creatinine stable at 1.1  Family Communication: None    Disposition: Status is: Inpatient         Author: Alberteen Sam, MD 09/19/2022 1:11 PM  For on call review [email or web address].

## 2022-09-19 NOTE — Progress Notes (Addendum)
ID brief note   Discussed with radiologist Dr Margo Aye regarding CT chest findings - could be degenerative changes vs discitis/osteomyelitis in T8-T 9 spine . Unable to get MRT T spine so far.  Recommended CT T spine w contrast if unable to get MRI as if findings appear same, more suggestive of degenerative changes.   I have spoke with micro today and added on fungal as well afb cx to disc aspirate cx on 9/5   If disc aspirate cx 9/5 ( were done off abtx) as well as CT T spine more s/p degenerative changes, would stop abtx.  She has a fu arranged with me on 10/3 at 9: 30 am   Dr Renold Don on service starting 9/8 and will fu pending studies   Odette Fraction, MD Infectious Disease Physician [facility] [address] [place], [place] [address] Phone: [phone]  Fax: [phone]

## 2022-09-20 ENCOUNTER — Inpatient Hospital Stay (HOSPITAL_COMMUNITY): Payer: Medicare HMO

## 2022-09-20 DIAGNOSIS — J9602 Acute respiratory failure with hypercapnia: Secondary | ICD-10-CM | POA: Diagnosis not present

## 2022-09-20 DIAGNOSIS — I5033 Acute on chronic diastolic (congestive) heart failure: Secondary | ICD-10-CM | POA: Diagnosis not present

## 2022-09-20 DIAGNOSIS — I50813 Acute on chronic right heart failure: Secondary | ICD-10-CM | POA: Diagnosis not present

## 2022-09-20 DIAGNOSIS — I2781 Cor pulmonale (chronic): Secondary | ICD-10-CM | POA: Diagnosis not present

## 2022-09-20 LAB — BASIC METABOLIC PANEL
Anion gap: 9 (ref 5–15)
BUN: 21 mg/dL (ref 8–23)
CO2: 30 mmol/L (ref 22–32)
Calcium: 8.6 mg/dL — ABNORMAL LOW (ref 8.9–10.3)
Chloride: 99 mmol/L (ref 98–111)
Creatinine, Ser: 1.19 mg/dL — ABNORMAL HIGH (ref 0.44–1.00)
GFR, Estimated: 50 mL/min — ABNORMAL LOW (ref >60)
Glucose, Bld: 153 mg/dL — ABNORMAL HIGH (ref 70–99)
Potassium: 4.2 mmol/L (ref 3.5–5.1)
Sodium: 138 mmol/L (ref 135–145)

## 2022-09-20 LAB — GLUCOSE, CAPILLARY
Glucose-Capillary: 202 mg/dL — ABNORMAL HIGH (ref 70–99)
Glucose-Capillary: 154 mg/dL — ABNORMAL HIGH (ref 70–99)
Glucose-Capillary: 157 mg/dL — ABNORMAL HIGH (ref 70–99)

## 2022-09-20 LAB — MAGNESIUM: Magnesium: 2.3 mg/dL (ref 1.7–2.4)

## 2022-09-20 MED ORDER — FLUCONAZOLE 200 MG PO TABS
200.0000 mg | ORAL_TABLET | Freq: Once | ORAL | Status: AC
  Administered 2022-09-20: 200 mg via ORAL
  Filled 2022-09-20: qty 1

## 2022-09-20 MED ORDER — FUROSEMIDE 40 MG PO TABS
80.0000 mg | ORAL_TABLET | Freq: Two times a day (BID) | ORAL | Status: DC
  Administered 2022-09-21 – 2022-09-22 (×3): 80 mg via ORAL
  Filled 2022-09-20 (×3): qty 2

## 2022-09-20 MED ORDER — AMIODARONE HCL 200 MG PO TABS
200.0000 mg | ORAL_TABLET | Freq: Two times a day (BID) | ORAL | Status: DC
  Administered 2022-09-20 – 2022-09-22 (×4): 200 mg via ORAL
  Filled 2022-09-20 (×4): qty 1

## 2022-09-20 MED ORDER — IOHEXOL 350 MG/ML SOLN
75.0000 mL | Freq: Once | INTRAVENOUS | Status: AC | PRN
  Administered 2022-09-20: 75 mL via INTRAVENOUS

## 2022-09-20 NOTE — Progress Notes (Signed)
Occupational Therapy Treatment Patient Details Name: Linda Chase MRN: [id number] DOB: March 06, 1955 Today's Date: 09/20/2022   History of present illness Linda Chase is a 67 y.o. female who presented to [facility] ED with complaints of lower back pain however due to respiratory difficulty placed on biPap and then transferred to [facility]. HR also in 130s in a-fib. PMH: HTN, paroxysmal a-fib on chronic anticoagulation, DM II, cerebral aneurysm, and morbid obesity   OT comments  Pt with slow progression towards goals this session, needing set up - min A for A LB ADLs and set upA for UB ADLs, min A for transfers with use of RW. Pt declines wearing O2 during session, SpO2 mid 90's on RA seated in chair at end of session, RN notified. Pt presenting with impairments listed below, will follow acutely. Patient will benefit from continued inpatient follow up therapy, <3 hours/day to maximize safety/ind with ADLs/functional mobility.       If plan is discharge home, recommend the following:  A little help with walking and/or transfers;A lot of help with bathing/dressing/bathroom;Direct supervision/assist for medications management;Help with stairs or ramp for entrance;Assist for transportation;Direct supervision/assist for financial management;Assistance with cooking/housework   Equipment Recommendations  Other (comment) (defer)    Recommendations for Other Services PT consult    Precautions / Restrictions Precautions Precautions: Fall Precaution Comments: watch SpO2 and HR Restrictions Weight Bearing Restrictions: No       Mobility Bed Mobility               General bed mobility comments: OOB in chair upon arrival/departure    Transfers Overall transfer level: Needs assistance Equipment used: Rolling walker (2 wheels), None Transfers: Sit to/from Stand, Bed to chair/wheelchair/BSC Sit to Stand: Min assist                 Balance Overall balance assessment:  Needs assistance Sitting-balance support: Feet supported, No upper extremity supported Sitting balance-Leahy Scale: Good Sitting balance - Comments: sits unsupported in chair   Standing balance support: Bilateral upper extremity supported, During functional activity, No upper extremity supported Standing balance-Leahy Scale: Poor Standing balance comment: with RW support during ambulation. Pt requires UE support to static stand for hygiene tasks                           ADL either performed or assessed with clinical judgement   ADL Overall ADL's : Needs assistance/impaired                 Upper Body Dressing : Set up;Sitting       Toilet Transfer: Minimal assistance;Rolling walker (2 wheels);Ambulation;Regular Teacher, adult education Details (indicate cue type and reason): simulated via functional mobility         Functional mobility during ADLs: Minimal assistance;Rolling walker (2 wheels)      Extremity/Trunk Assessment Upper Extremity Assessment Upper Extremity Assessment: Generalized weakness   Lower Extremity Assessment Lower Extremity Assessment: Defer to PT evaluation        Vision   Vision Assessment?: No apparent visual deficits   Perception Perception Perception: Not tested   Praxis Praxis Praxis: Not tested    Cognition Arousal: Alert Behavior During Therapy: WFL for tasks assessed/performed Overall Cognitive Status: No family/caregiver present to determine baseline cognitive functioning Area of Impairment: Following commands, Safety/judgement, Awareness, Problem solving  Safety/Judgement: Decreased awareness of safety, Decreased awareness of deficits Awareness: Emergent Problem Solving: Slow processing, Difficulty sequencing, Requires verbal cues, Requires tactile cues          Exercises      Shoulder Instructions       General Comments pt declines donning oxygen during session, SpO2 mid  90's at end of session    Pertinent Vitals/ Pain       Pain Assessment Pain Assessment: No/denies pain  Home Living                                          Prior Functioning/Environment              Frequency  Min 1X/week        Progress Toward Goals  OT Goals(current goals can now be found in the care plan section)  Progress towards OT goals: Progressing toward goals  Acute Rehab OT Goals Patient Stated Goal: none stated OT Goal Formulation: With patient Time For Goal Achievement: 10/04/22 Potential to Achieve Goals: Fair ADL Goals Pt Will Perform Grooming: with set-up;standing Pt Will Perform Lower Body Bathing: with supervision;with adaptive equipment;sit to/from stand Pt Will Perform Lower Body Dressing: with supervision;sit to/from stand;with adaptive equipment Pt Will Transfer to Toilet: with supervision;bedside commode;ambulating Pt Will Perform Toileting - Clothing Manipulation and hygiene: with supervision;with adaptive equipment;sit to/from stand Pt Will Perform Tub/Shower Transfer: with min assist;tub bench;Tub transfer Additional ADL Goal #1: Pt will independently verbalize at least two energy conservation strategies for selfcare tasks.  Plan      Co-evaluation                 AM-PAC OT "6 Clicks" Daily Activity     Outcome Measure   Help from another person eating meals?: None Help from another person taking care of personal grooming?: A Little Help from another person toileting, which includes using toliet, bedpan, or urinal?: A Lot Help from another person bathing (including washing, rinsing, drying)?: A Lot Help from another person to put on and taking off regular upper body clothing?: A Little Help from another person to put on and taking off regular lower body clothing?: A Lot 6 Click Score: 16    End of Session Equipment Utilized During Treatment: Rolling walker (2 wheels)  OT Visit Diagnosis: Unsteadiness on feet  (R26.81);Other abnormalities of gait and mobility (R26.89);Muscle weakness (generalized) (M62.81);Pain;Other symptoms and signs involving cognitive function   Activity Tolerance Patient tolerated treatment well   Patient Left in chair;with call bell/phone within reach   Nurse Communication Mobility status (SpO2 not displaying on monitor, pt not wanting to wear O2)        Time: 1005-1018 OT Time Calculation (min): 13 min  Charges: OT General Charges $OT Visit: 1 Visit OT Treatments $Self Care/Home Management : 8-22 mins  Carver Fila, OTD, OTR/L [employer] Preferred Acute Rehab [phone]   Carver Fila Koonce 09/20/2022, 12:28 PM

## 2022-09-20 NOTE — Progress Notes (Signed)
Physical Therapy Treatment Patient Details Name: Linda Chase MRN: [id number] DOB: 20-Apr-1955 Today's Date: 09/20/2022   History of Present Illness Linda Chase is a 67 y.o. female who presented to [facility] ED with complaints of lower back pain however due to respiratory difficulty placed on biPap and then transferred to [facility]. HR also in 130s in a-fib. PMH: HTN, paroxysmal a-fib on chronic anticoagulation, DM II, cerebral aneurysm, and morbid obesity    PT Comments  Pt received sitting in the recliner and agreeable to session. Pt able to participate in exercises for core and glute strengthening. Pt demonstrating increased difficulty with bridges, but is able to perform with cues for technique. Pt able to tolerate gait trial with slightly improved upright posture, however is limited by back and knee pain.  Pt instructed in logroll technique for bed mobility to reduce back pain with pt able to demonstrate with up to mod A. Pt continues to benefit from PT services to progress toward functional mobility goals.    If plan is discharge home, recommend the following: A lot of help with walking and/or transfers;Help with stairs or ramp for entrance;Assist for transportation;Assistance with cooking/housework;A lot of help with bathing/dressing/bathroom   Can travel by private vehicle     No  Equipment Recommendations  Rolling walker (2 wheels)    Recommendations for Other Services       Precautions / Restrictions Precautions Precautions: Fall Precaution Comments: watch SpO2 and HR Restrictions Weight Bearing Restrictions: No     Mobility  Bed Mobility Overal bed mobility: Needs Assistance Bed Mobility: Supine to Sit, Sit to Supine     Supine to sit: Min assist, Used rails, HOB elevated Sit to supine: Mod assist, Used rails   General bed mobility comments: Min-mod A for BLE managment. Cues for logroll technique to reduce back pain    Transfers Overall transfer  level: Needs assistance Equipment used: Rolling walker (2 wheels), None Transfers: Sit to/from Stand Sit to Stand: Contact guard assist           General transfer comment: STS from various surfaces with increased time for power up and CGA for safety    Ambulation/Gait Ambulation/Gait assistance: Contact guard assist Gait Distance (Feet): 200 Feet Assistive device: Rolling walker (2 wheels) Gait Pattern/deviations: Step-through pattern, Decreased stride length, Trunk flexed Gait velocity: dec     General Gait Details: Cues for upright posture and glute activation for reduced back pain with pt intermittently improving. Pt performing back extensions during standing rest breaks      Balance Overall balance assessment: Needs assistance Sitting-balance support: Feet supported, No upper extremity supported Sitting balance-Leahy Scale: Good Sitting balance - Comments: sitting EOB   Standing balance support: Bilateral upper extremity supported, During functional activity, No upper extremity supported Standing balance-Leahy Scale: Fair Standing balance comment: with RW support. Pt able to perform short distances without AD with no LOB                            Cognition Arousal: Alert Behavior During Therapy: WFL for tasks assessed/performed Overall Cognitive Status: No family/caregiver present to determine baseline cognitive functioning                                          Exercises Other Exercises Other Exercises: posterior pelvic tilts x10 reps Other Exercises: supine  bridges 3 sets x3 reps Other Exercises: standing back extension x10 reps    General Comments General comments (skin integrity, edema, etc.): pt declines donning oxygen during session, SpO2 mid 90's at end of session      Pertinent Vitals/Pain Pain Assessment Pain Assessment: 0-10 Pain Score: 9  Pain Location: back Pain Descriptors / Indicators: Discomfort,  Grimacing Pain Intervention(s): Limited activity within patient's tolerance, Monitored during session, Repositioned     PT Goals (current goals can now be found in the care plan section) Acute Rehab PT Goals Patient Stated Goal: home asap PT Goal Formulation: With patient Time For Goal Achievement: 09/21/22 Progress towards PT goals: Progressing toward goals    Frequency    Min 1X/week      PT Plan         AM-PAC PT "6 Clicks" Mobility   Outcome Measure  Help needed turning from your back to your side while in a flat bed without using bedrails?: A Little Help needed moving from lying on your back to sitting on the side of a flat bed without using bedrails?: A Little Help needed moving to and from a bed to a chair (including a wheelchair)?: A Little Help needed standing up from a chair using your arms (e.g., wheelchair or bedside chair)?: A Little Help needed to walk in hospital room?: A Little Help needed climbing 3-5 steps with a railing? : Total 6 Click Score: 16    End of Session   Activity Tolerance: Patient tolerated treatment well Patient left: with call bell/phone within reach;in chair Nurse Communication: Mobility status PT Visit Diagnosis: Unsteadiness on feet (R26.81);Other abnormalities of gait and mobility (R26.89);Pain     Time: 0865-7846 PT Time Calculation (min) (ACUTE ONLY): 40 min  Charges:    $Gait Training: 8-22 mins $Therapeutic Exercise: 8-22 mins $Therapeutic Activity: 8-22 mins PT General Charges $$ ACUTE PT VISIT: 1 Visit                     Johny Shock, PTA Acute Rehabilitation Services Secure Chat Preferred  [phone]    Johny Shock 09/20/2022, 3:43 PM

## 2022-09-20 NOTE — Progress Notes (Signed)
  Progress Note   Patient: Linda Chase JSE:[id number] DOB: Jan 12, 1956 DOA: 09/06/2022     14 DOS: the patient was seen and examined on 09/20/2022 at 3:10PM      Brief hospital course: [name] is a 67 y.o. F with MO, OSA not on CPAP, pAF on Eliquis, COPD, DM, HTN, hx cerebral aneurysm s/p clipping in 2006 in [place] who presented with over a month of progressive mid back pain, now severe.   Most of hospital stay has been dedicated to diuresing acute on chronic congestive heart failure cor pulmonale   Also was treated empirically for osteomyelitis of the spine      Assessment and Plan: * Acute on chronic HFmrEF Acute on chronic Cor Pulmonale EF this admission 50%, RV reduced as well. Again net neg 1.4L yesterday, 24L on admission, Cr stable.  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   - Hold antibiotics - Repeat CT spine - Defer OPAT to ID, appreciate expertise   Paroxysmal atrial fibrillation with RVR (HCC) Rates controlled - Continue amiodarone, reduce to 200 BID for 2 weeks then 200 daily - Continue metoprolol, Eliquis - Outpatient Cards f/u   OSA (obstructive sleep apnea) - BiPAP QHS   Coronary artery disease involving native coronary artery of native heart without angina pectoris - Continue atorvastatin, metoprolol  Obesity, Class III, BMI 40-49.9 (morbid obesity) (HCC) BMI 53  COPD (chronic obstructive pulmonary disease) (HCC) Seems stable, not the main driver of her respiratory failure - Continue LAMA  Hypertension BP normal - Continue metoprolol, furosemide - Stop HCTZ, amlodipine  Diabetes mellitus (HCC) Gluc mostly normal - Continue sliding scale corrections - Continue gabapentin for diabetic related neuropathy - Hold home metformin and Januvia while in the hospital          Subjective: Has back and flank/rib pain  radiating from the midback.  Annoying and persistent.  No fever, no confusion, swelling better, respiratory status good.     Physical Exam: BP (!) 122/91 (BP Location: Left Wrist)   Pulse 84   Temp 98.7 F (37.1 C) (Oral)   Resp 20   Ht 5\' 4"  (1.626 m)   Wt 129.3 kg   SpO2 97%   BMI 48.93 kg/m   Obese female, sitting up, interactive and appropriate, RRR, no murmurs, brawny changes to the legs, pitting edema is improving, minimal, respiratory rate normal, lungs clear without rales or wheezes Attention normal, affect blunted, speech fluent, face symmetric, moves upper extremities normal strength and coordination  Data Reviewed: Discussed with cardiology team and infectious disease team Patient metabolic panel stable, normal  Family Communication: None    Disposition: Status is: Inpatient Patient admitted with back pain, also had respiratory failure which turned out to be from biventricular heart failure, cor pulmonale  All of this has been stabilized, will transition to oral diuretics tomorrow, likely home Wednesday.  There is also been a question of spinal infection, and the major barrier here has been lack of ability to obtain MRI.             Author: Alberteen Sam, MD 09/20/2022 4:23 PM  For on call review [email or web address].

## 2022-09-20 NOTE — Progress Notes (Signed)
RT went to place patient on BiPAP.  Patient states she is not ready for it and will have RN call when ready.

## 2022-09-20 NOTE — Progress Notes (Signed)
   09/20/22 0211  BiPAP/CPAP/SIPAP  BiPAP/CPAP/SIPAP Pt Type Adult  BiPAP/CPAP/SIPAP SERVO  Reason BIPAP/CPAP not in use Non-compliant  Mask Type Full face mask  Mask Size Medium

## 2022-09-20 NOTE — Progress Notes (Signed)
Patient's cultures are w/o growth.  CT imaging is unable to differentiate infectious vs degenerative changes.   Sed Rate (mm/hr)  Date Value  09/10/2022 57 (H)   CRP (mg/dL)  Date Value  09/81/1914 6.5 (H)  09/06/2022 4.3 (H)   Will move appt up to 9.24 with Dr. Elinor Parkinson for close outpatient follow up.  Will follow up off antibiotics.   D/W Dr. Renold Don and Dr. Maryfrances Bunnell.   Rexene Alberts, MSN, NP-C [facility] for Infectious Disease [facility]  [email or web address] Pager: [phone] Office: [phone] [facility] Main Line: [phone] *Secure Chat Communication Welcome

## 2022-09-20 NOTE — TOC Progression Note (Signed)
Transition of Care [facility]) - Progression Note    Patient Details  Name: Linda Chase MRN: [id number] Date of Birth: 1955/08/21  [facility]) CM/SW Contact  Carley Hammed, LCSW Phone Number: 09/20/2022, 1:37 PM  Clinical Narrative:    CSW met with pt at bedside to consider discussions about discharge planning. Pt upright in chair and agreeable to conversation. Pt and CSW discussed pt's improvements with mobility and pt states she feels comfortable going home at this point. She declines SNF and requested CSW assist with starting a Medicaid application, as she states she will need things when she goes home. CSW advised this process can take a while and pt noted understanding. CSW to consult financial counseling. Pt declines CSW speaking with her son as he is never there and she cannot depend on him. TOC will continue to follow for DC planning needs.    Expected Discharge Plan: Home w Home Health Services Barriers to Discharge: Continued Medical Work up  Expected Discharge Plan and Services   Discharge Planning Services: CM Consult Post Acute Care Choice: Home Health Living arrangements for the past 2 months: Apartment                 DME Arranged:  (see note)                     Social Determinants of Health (SDOH) Interventions SDOH Screenings   Food Insecurity: No Food Insecurity (09/09/2022)  Housing: Low Risk  (09/09/2022)  Transportation Needs: No Transportation Needs (09/09/2022)  Utilities: Not At Risk (09/09/2022)  Tobacco Use: High Risk (09/16/2022)    Readmission Risk Interventions     No data to display

## 2022-09-20 NOTE — Plan of Care (Signed)

## 2022-09-21 DIAGNOSIS — I5033 Acute on chronic diastolic (congestive) heart failure: Secondary | ICD-10-CM | POA: Diagnosis not present

## 2022-09-21 DIAGNOSIS — M4620 Osteomyelitis of vertebra, site unspecified: Secondary | ICD-10-CM | POA: Diagnosis not present

## 2022-09-21 DIAGNOSIS — I480 Paroxysmal atrial fibrillation: Secondary | ICD-10-CM | POA: Diagnosis not present

## 2022-09-21 DIAGNOSIS — J9602 Acute respiratory failure with hypercapnia: Secondary | ICD-10-CM | POA: Diagnosis not present

## 2022-09-21 LAB — BASIC METABOLIC PANEL
Anion gap: 11 (ref 5–15)
BUN: 21 mg/dL (ref 8–23)
CO2: 26 mmol/L (ref 22–32)
Calcium: 8.9 mg/dL (ref 8.9–10.3)
Chloride: 99 mmol/L (ref 98–111)
Creatinine, Ser: 1.16 mg/dL — ABNORMAL HIGH (ref 0.44–1.00)
GFR, Estimated: 52 mL/min — ABNORMAL LOW (ref >60)
Glucose, Bld: 165 mg/dL — ABNORMAL HIGH (ref 70–99)
Potassium: 4.5 mmol/L (ref 3.5–5.1)
Sodium: 136 mmol/L (ref 135–145)

## 2022-09-21 LAB — CBC
HCT: 37.2 % (ref 36.0–46.0)
Hemoglobin: 10.7 g/dL — ABNORMAL LOW (ref 12.0–15.0)
MCH: 23.9 pg — ABNORMAL LOW (ref 26.0–34.0)
MCHC: 28.8 g/dL — ABNORMAL LOW (ref 30.0–36.0)
MCV: 83.2 fL (ref 80.0–100.0)
Platelets: 395 10*3/uL (ref 150–400)
RBC: 4.47 MIL/uL (ref 3.87–5.11)
RDW: 15.2 % (ref 11.5–15.5)
WBC: 9.1 10*3/uL (ref 4.0–10.5)
nRBC: 0 % (ref 0.0–0.2)

## 2022-09-21 LAB — GLUCOSE, CAPILLARY
Glucose-Capillary: 172 mg/dL — ABNORMAL HIGH (ref 70–99)
Glucose-Capillary: 176 mg/dL — ABNORMAL HIGH (ref 70–99)
Glucose-Capillary: 166 mg/dL — ABNORMAL HIGH (ref 70–99)

## 2022-09-21 LAB — ACID FAST SMEAR (AFB, MYCOBACTERIA)
Acid Fast Smear: NEGATIVE
Source (AFB): 9

## 2022-09-21 NOTE — Progress Notes (Signed)
Mobility Specialist Progress Note:   09/21/22 1100  Mobility  Activity Ambulated with assistance in hallway  Level of Assistance Standby assist, set-up cues, supervision of patient - no hands on  Assistive Device Front wheel walker  Distance Ambulated (ft) 340 ft  Activity Response Tolerated well  Mobility Referral Yes  $Mobility charge 1 Mobility  Mobility Specialist Start Time (ACUTE ONLY) 1111  Mobility Specialist Stop Time (ACUTE ONLY) 1126  Mobility Specialist Time Calculation (min) (ACUTE ONLY) 15 min    Pre Mobility: 70 HR During Mobility: 121 HR Post Mobility:  102 HR  Pt received in chair, agreeable to mobility. C/o R knee instability and L flank pain. Required verbal cues for walker management and avoiding obstacles. Pt left in chair with call bell and all needs met.  Linda Chase Mobility Specialist Please contact via Special educational needs teacher or Rehab office at [phone]

## 2022-09-21 NOTE — Plan of Care (Signed)

## 2022-09-21 NOTE — TOC Progression Note (Addendum)
Transition of Care (TOC) - Progression Note   Spoke to patient at bedside. Confirmed face sheet information again.   PT recommendation for SNF and rolling walker. Patient does not feel that she needs SNF or walker at this time. Agreeable to HHPT . Patient aware HHPT usually visits twice a week   Patient does not walker ( HHPT will follow up).   Patient lives with son and will call him to update him on discharge plan . Declined TOC to call son.   Anticipated discharge date tomorrow 09/22/22 patient aware and will call her transportation company   [facility] accepted referral for HHPT  Patient Details  Name: Linda Chase MRN: [id number] Date of Birth: [birth year]  Transition [facility]) CM/SW Contact  Damon Baisch, Adria Devon, RN Phone Number: 09/21/2022, 11:16 AM  Clinical Narrative:       Expected Discharge Plan: Home w Home Health Services Barriers to Discharge: Continued Medical Work up  Expected Discharge Plan and Services   Discharge Planning Services: CM Consult Post Acute Care Choice: Home Health Living arrangements for the past 2 months: Apartment                 DME Arranged:  (declined walker) DME Agency: NA                   Social Determinants of Health (SDOH) Interventions SDOH Screenings   Food Insecurity: No Food Insecurity (09/09/2022)  Housing: Low Risk  (09/09/2022)  Transportation Needs: No Transportation Needs (09/09/2022)  Utilities: Not At Risk (09/09/2022)  Tobacco Use: High Risk (09/16/2022)    Readmission Risk Interventions     No data to display

## 2022-09-21 NOTE — Progress Notes (Signed)
Progress Note   Patient: Linda Chase [id number]:[id number] DOB: Oct 12, 1955 DOA: 09/06/2022     15 DOS: the patient was seen and examined on 09/21/2022 at 12:05 PM      Brief hospital course: Linda Chase is a 67 y.o. F with MO, OSA not on CPAP, pAF on Eliquis, COPD, DM, HTN, hx cerebral aneurysm s/p clipping in 2006 in [place] who presented with over a month of progressive mid back pain, finally severe.   Shortly after admission, developed respiratory failure due to cor pulmonale and CHF.     During workup for respiratory disease, incidentally found to have thoracic endplate changes concerning for possible osteomyelitis.    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)   Paroxysmal atrial fibrillation with RVR (HCC)   Diabetes mellitus (HCC)   Hypertension   COPD (chronic obstructive pulmonary disease) (HCC)   Obesity, Class III, BMI 40-49.9 (morbid obesity) (HCC)   Coronary artery disease involving native coronary artery of native heart without angina pectoris   Acute on chronic heart failure with preserved ejection fraction (HFpEF) (HCC)   OSA (obstructive sleep apnea)   Acute on chronic Cor pulmonale (HCC)   Obesity hypoventilation syndrome (HCC)    Assessment and Plan: *Acute on chronic respiratory failure with hypercapnia and hypoxia  Obesity hypoventilation syndrome Acute on chronic cor pulmonale Acute on chronic heart failure with preserved ejection fraction Obstructive sleep apnea Pulmonary hypertension   No prior CHF history until recent admission in [place].  Discharged on Lasix 40 daily from there, but didn't know it was supposed to conitnue long term, had no Cards f/u here.  On this admission here, echocardiogram showed EF 50%, signs of right heart overload.  She was started on diuretics and diuresed 24L.  Net negative 1L in the last 24 hours, Cr stable at 1.1.  K  normal. - Transition to oral Lasix 80 BID - If I/Os even and Cr stable, d/c on that dose - Message sent to Cards master already to arrange outpatient Cardiology f/u - Needs polysomnogram referral by PCP    Abnormal imaging of thoracic spine There was some initial concern given her leukocytosis and endplate changes that she had vertebral osteomyelitis.  Blood cultures negative, aspirate of the disc space negative for growth to date.  Extensive effort was made to obtain records from her hospital and doctors in [place] regarding her 2006 aneurysm clips to see if there were MRI compatible, but no definitive information could be found and so we could not perform MRI.  Given no growth on aspiration, ID recommended close follow-up in the office for CRP/ESR, WBC, and possibly repeat imaging - Recommend outpatient follow-up with Dr. Elinor Chase 9/24 at 4pm - PT    Paroxysmal atrial fibrillation with RVR (HCC) Started on amiodarone here, rates controlled. - Continue new amiodarone 200 BID until Sep 22 then transition to 200 daily - Continue higher dose metoprolol - Continue Eliquis - Follow up with new Cardiologist, referral pending   OSA (obstructive sleep apnea) - Continue at bedtime BiPAP order here - Recommend Polysomnogram at d/c  Coronary artery disease involving native coronary artery of native heart without angina pectoris - Continue atorvastatin and metoprolol  Hypertension BP normal - Continue metoprolol, furosemide - Stop home HCTZ, amlodipine  Diabetes mellitus (HCC) Glucose normal - Continue sliding scale correction insulin - Continue gabapentin - Hold metformin and Januvia  Subjective: Still with some flank pain.  Repeat CT imaging yesterday of the thoracic spine c/w degenrative disease.  I suspect the flank pain is radicular, as it worsens with movement, no associated urinary irritative symptoms.  No fever.  No respiratory symptoms.  Moving much  better     Physical Exam: BP 136/73 (BP Location: Left Wrist)   Pulse 71   Temp 97.9 F (36.6 C) (Oral)   Resp 16   Ht 5\' 4"  (1.626 m)   Wt 129.3 kg   SpO2 94%   BMI 48.93 kg/m   Adult female, sitting up in recliner, interactive and appropriate RRR, no murmurs, she has brawny change of bilateral lower extremities, but pitting edema is much much less Respiratory rate seems normal, lungs clear without rales or wheezes Attention normal, affect appropriate, judgment and insight appear normal    Data Reviewed: Basic metabolic panel shows stable creatinine and potassium Cultures no growth to date       Disposition: Status is: Inpatient Patient was admitted for congestive heart failure cor pulmonale  She was diuresed 24 L and she is now close to euvolemic  There was a question whether she had infection in the spine, but the consensus at this time is that she does not, and she will follow-up with infectious disease in 2 weeks  Likely home tomorrow if Cr and urine output okay on orals        Author: Alberteen Sam, MD 09/21/2022 2:11 PM  For on call review [email or web address].

## 2022-09-22 ENCOUNTER — Encounter (HOSPITAL_COMMUNITY): Payer: Self-pay | Admitting: Family Medicine

## 2022-09-22 ENCOUNTER — Other Ambulatory Visit (HOSPITAL_COMMUNITY): Payer: Self-pay

## 2022-09-22 DIAGNOSIS — J9602 Acute respiratory failure with hypercapnia: Secondary | ICD-10-CM | POA: Diagnosis not present

## 2022-09-22 LAB — CBC
HCT: 36.2 % (ref 36.0–46.0)
Hemoglobin: 10.6 g/dL — ABNORMAL LOW (ref 12.0–15.0)
MCH: 24.7 pg — ABNORMAL LOW (ref 26.0–34.0)
MCHC: 29.3 g/dL — ABNORMAL LOW (ref 30.0–36.0)
MCV: 84.4 fL (ref 80.0–100.0)
Platelets: 325 10*3/uL (ref 150–400)
RBC: 4.29 MIL/uL (ref 3.87–5.11)
RDW: 15.6 % — ABNORMAL HIGH (ref 11.5–15.5)
WBC: 9.6 10*3/uL (ref 4.0–10.5)
nRBC: 0 % (ref 0.0–0.2)

## 2022-09-22 LAB — GLUCOSE, CAPILLARY
Glucose-Capillary: 187 mg/dL — ABNORMAL HIGH (ref 70–99)
Glucose-Capillary: 208 mg/dL — ABNORMAL HIGH (ref 70–99)
Glucose-Capillary: 141 mg/dL — ABNORMAL HIGH (ref 70–99)
Glucose-Capillary: 177 mg/dL — ABNORMAL HIGH (ref 70–99)

## 2022-09-22 LAB — BASIC METABOLIC PANEL
Anion gap: 9 (ref 5–15)
BUN: 28 mg/dL — ABNORMAL HIGH (ref 8–23)
CO2: 27 mmol/L (ref 22–32)
Calcium: 8.3 mg/dL — ABNORMAL LOW (ref 8.9–10.3)
Chloride: 99 mmol/L (ref 98–111)
Creatinine, Ser: 1.15 mg/dL — ABNORMAL HIGH (ref 0.44–1.00)
GFR, Estimated: 53 mL/min — ABNORMAL LOW (ref >60)
Glucose, Bld: 141 mg/dL — ABNORMAL HIGH (ref 70–99)
Potassium: 4.7 mmol/L (ref 3.5–5.1)
Sodium: 135 mmol/L (ref 135–145)

## 2022-09-22 MED ORDER — POTASSIUM CHLORIDE CRYS ER 10 MEQ PO TBCR
20.0000 meq | EXTENDED_RELEASE_TABLET | Freq: Every day | ORAL | 0 refills | Status: DC
  Filled 2022-09-22: qty 28, 14d supply, fill #0

## 2022-09-22 MED ORDER — AMIODARONE HCL 200 MG PO TABS
200.0000 mg | ORAL_TABLET | Freq: Two times a day (BID) | ORAL | 0 refills | Status: DC
  Filled 2022-09-22: qty 60, 30d supply, fill #0

## 2022-09-22 MED ORDER — APIXABAN 5 MG PO TABS
5.0000 mg | ORAL_TABLET | Freq: Two times a day (BID) | ORAL | 1 refills | Status: DC
Start: 2022-09-22 — End: 2022-10-06
  Filled 2022-09-22: qty 60, 30d supply, fill #0

## 2022-09-22 MED ORDER — METOPROLOL SUCCINATE ER 100 MG PO TB24
100.0000 mg | ORAL_TABLET | Freq: Every day | ORAL | 0 refills | Status: DC
  Filled 2022-09-22: qty 30, 30d supply, fill #0

## 2022-09-22 MED ORDER — FUROSEMIDE 80 MG PO TABS
80.0000 mg | ORAL_TABLET | Freq: Two times a day (BID) | ORAL | 0 refills | Status: DC
  Filled 2022-09-22: qty 30, 15d supply, fill #0

## 2022-09-22 NOTE — TOC Progression Note (Addendum)
Transition of Care (TOC) - Progression Note   Patient now in agreement for walker requesting 3 in 1 . Placed orders for both and secure chatted MD to sign.   Called Mitch with [employer] for rolling walker  to be delivered to room. And 3 in 1 to home   [facility] aware of anticipated discharge today   Patient has arranged transportation home today for 2:30 to 3 pm . MD aware  Patient Details  Name: Linda Chase MRN: [id number] Date of Birth: May 05, 1955  Transition of Care [facility]) CM/SW Contact  Jahiem Franzoni, Adria Devon, RN Phone Number: 09/22/2022, 11:01 AM  Clinical Narrative:       Expected Discharge Plan: Home w Home Health Services Barriers to Discharge: Continued Medical Work up  Expected Discharge Plan and Services   Discharge Planning Services: CM Consult Post Acute Care Choice: Home Health Living arrangements for the past 2 months: Apartment                 DME Arranged:  (declined walker) DME Agency: NA                   Social Determinants of Health (SDOH) Interventions SDOH Screenings   Food Insecurity: No Food Insecurity (09/09/2022)  Housing: Low Risk  (09/09/2022)  Transportation Needs: No Transportation Needs (09/09/2022)  Utilities: Not At Risk (09/09/2022)  Tobacco Use: High Risk (09/16/2022)    Readmission Risk Interventions     No data to display

## 2022-09-22 NOTE — Plan of Care (Signed)
  Problem: Education: Goal: Knowledge of General Education information will improve Description: Including pain rating scale, medication(s)/side effects and non-pharmacologic comfort measures Outcome: Progressing   Problem: Health Behavior/Discharge Planning: Goal: Ability to manage health-related needs will improve Outcome: Progressing   Problem: Clinical Measurements: Goal: Ability to maintain clinical measurements within normal limits will improve Outcome: Progressing Goal: Will remain free from infection Outcome: Progressing Goal: Diagnostic test results will improve Outcome: Progressing Goal: Respiratory complications will improve Outcome: Progressing Goal: Cardiovascular complication will be avoided Outcome: Progressing   Problem: Activity: Goal: Risk for activity intolerance will decrease Outcome: Progressing   Problem: Nutrition: Goal: Adequate nutrition will be maintained Outcome: Progressing   Problem: Coping: Goal: Level of anxiety will decrease Outcome: Progressing   Problem: Elimination: Goal: Will not experience complications related to bowel motility Outcome: Progressing Goal: Will not experience complications related to urinary retention Outcome: Progressing   Problem: Pain Managment: Goal: General experience of comfort will improve Outcome: Progressing   Problem: Pain Managment: Goal: General experience of comfort will improve Outcome: Progressing   Problem: Safety: Goal: Ability to remain free from injury will improve Outcome: Progressing   Problem: Skin Integrity: Goal: Risk for impaired skin integrity will decrease Outcome: Progressing   Problem: Education: Goal: Ability to describe self-care measures that may prevent or decrease complications (Diabetes Survival Skills Education) will improve Outcome: Progressing Goal: Individualized Educational Video(s) Outcome: Progressing   Problem: Coping: Goal: Ability to adjust to condition or  change in health will improve Outcome: Progressing   Problem: Fluid Volume: Goal: Ability to maintain a balanced intake and output will improve Outcome: Progressing   Problem: Health Behavior/Discharge Planning: Goal: Ability to identify and utilize available resources and services will improve Outcome: Progressing Goal: Ability to manage health-related needs will improve Outcome: Progressing   Problem: Metabolic: Goal: Ability to maintain appropriate glucose levels will improve Outcome: Progressing   Problem: Nutritional: Goal: Maintenance of adequate nutrition will improve Outcome: Progressing Goal: Progress toward achieving an optimal weight will improve Outcome: Progressing   Problem: Skin Integrity: Goal: Risk for impaired skin integrity will decrease Outcome: Progressing   Problem: Tissue Perfusion: Goal: Adequacy of tissue perfusion will improve Outcome: Progressing

## 2022-09-22 NOTE — Progress Notes (Signed)
Pt taken to the discharge lounge but unit secretary. Pt refused to sit un discharge lounge. Pt stated she wanted to sit outside so she wouldn't miss the [facility] bus. Pt also refused to use the walker to mobilize.

## 2022-09-22 NOTE — Plan of Care (Signed)

## 2022-09-22 NOTE — Progress Notes (Signed)
Physical Therapy Treatment Patient Details Name: Linda Chase MRN: [id number] DOB: May 23, 1955 Today's Date: 09/22/2022   History of Present Illness Linda Chase is a 67 y.o. female who presented to [facility] ED with complaints of lower back pain however due to respiratory difficulty placed on biPap and then transferred to [facility]. HR also in 130s in a-fib. PMH: HTN, paroxysmal a-fib on chronic anticoagulation, DM II, cerebral aneurysm, and morbid obesity    PT Comments  Pt has progressed well toward goal.  Emphasis today on general safety, progression of gait stability and stamina and safe negotiation of a flight of stairs with rail and RW used as a cane.     If plan is discharge home, recommend the following: A little help with bathing/dressing/bathroom;Assistance with cooking/housework;Assist for transportation   Can travel by private vehicle        Equipment Recommendations  Rolling walker (2 wheels);BSC/3in1    Recommendations for Other Services       Precautions / Restrictions Precautions Precautions: Fall Precaution Comments: watch SpO2 and HR Restrictions Weight Bearing Restrictions: No     Mobility  Bed Mobility               General bed mobility comments: oob in the recliner on arrival, back to the recliner    Transfers Overall transfer level: Needs assistance Equipment used: Rolling walker (2 wheels) Transfers: Sit to/from Stand Sit to Stand: Modified independent (Device/Increase time)           General transfer comment: uses UE's appropriately with ease to attain standing.    Ambulation/Gait Ambulation/Gait assistance: Supervision Gait Distance (Feet): 150 Feet (x2) Assistive device: Rolling walker (2 wheels) Gait Pattern/deviations: Step-through pattern   Gait velocity interpretation: <1.8 ft/sec, indicate of risk for recurrent falls   General Gait Details: steady in general with a bit of stable "waggle " of the RW due to  her gait.   Stairs Stairs: Yes Stairs assistance: Supervision Stair Management: One rail Right, With walker, Step to pattern, Forwards Number of Stairs: 11 (ascent/descent with rail and RW used as a cane.) General stair comments: safe with above set up.   Wheelchair Mobility     Tilt Bed    Modified Rankin (Stroke Patients Only)       Balance Overall balance assessment: Needs assistance Sitting-balance support: Feet supported, No upper extremity supported Sitting balance-Leahy Scale: Good     Standing balance support: Single extremity supported, No upper extremity supported, During functional activity Standing balance-Leahy Scale: Good Standing balance comment: able to stand without AD, no external support                            Cognition Arousal: Alert Behavior During Therapy: WFL for tasks assessed/performed Overall Cognitive Status: Within Functional Limits for tasks assessed                                          Exercises      General Comments        Pertinent Vitals/Pain Pain Assessment Pain Assessment: Faces Faces Pain Scale: Hurts a little bit Pain Location: R knee Pain Descriptors / Indicators: Discomfort Pain Intervention(s): Monitored during session    Home Living  Prior Function            PT Goals (current goals can now be found in the care plan section) Acute Rehab PT Goals PT Goal Formulation: With patient Time For Goal Achievement: 09/21/22 Potential to Achieve Goals: Good Progress towards PT goals: Progressing toward goals    Frequency    Min 1X/week      PT Plan      Co-evaluation              AM-PAC PT "6 Clicks" Mobility   Outcome Measure  Help needed turning from your back to your side while in a flat bed without using bedrails?: A Little Help needed moving from lying on your back to sitting on the side of a flat bed without using  bedrails?: A Little Help needed moving to and from a bed to a chair (including a wheelchair)?: A Little Help needed standing up from a chair using your arms (e.g., wheelchair or bedside chair)?: A Little Help needed to walk in hospital room?: A Little Help needed climbing 3-5 steps with a railing? : A Little 6 Click Score: 18    End of Session       Nurse Communication: Mobility status PT Visit Diagnosis: Unsteadiness on feet (R26.81);Other abnormalities of gait and mobility (R26.89)     Time: 7829-5621 PT Time Calculation (min) (ACUTE ONLY): 34 min  Charges:      PT General Charges $$ ACUTE PT VISIT: 1 Visit                     09/22/2022  Jacinto Halim., PT Acute Rehabilitation Services [phone]  (office)   Eliseo Gum Kholton Coate 09/22/2022, 12:15 PM

## 2022-09-22 NOTE — Progress Notes (Signed)
Heart Failure Nurse Navigator Progress Note  PCP: Rometta Emery, MD PCP-Cardiologist: none Admission Diagnosis: Atrial fibrillation with RVR, acute respiratory failure Admitted from: Home  Presentation:   Linda Chase presented with back pain   ECHO/ LVEF: 50%  Clinical Course:  Past Medical History:  Diagnosis Date   Arthritis    Cerebral aneurysm    x2   Depression    Diabetes mellitus    Hypertension      Social History   Socioeconomic History   Marital status: Legally Separated    Spouse name: Not on file   Number of children: Not on file   Years of education: Not on file   Highest education level: Not on file  Occupational History   Not on file  Tobacco Use   Smoking status: Some Days    Types: Cigarettes   Smokeless tobacco: Never  Vaping Use   Vaping status: Never Used  Substance and Sexual Activity   Alcohol use: No   Drug use: No   Sexual activity: Never  Other Topics Concern   Not on file  Social History Narrative   Not on file   Social Determinants of Health   Financial Resource Strain: Not on file  Food Insecurity: No Food Insecurity (09/09/2022)   Hunger Vital Sign    Worried About Running Out of Food in the Last Year: Never true    Ran Out of Food in the Last Year: Never true  Transportation Needs: No Transportation Needs (09/09/2022)   PRAPARE - Administrator, Civil Service (Medical): No    Lack of Transportation (Non-Medical): No  Physical Activity: Not on file  Stress: Not on file  Social Connections: Not on file    Education Assessment and Provision:  Detailed education and instructions provided on heart failure disease management including the following:  Signs and symptoms of Heart Failure When to call the physician Importance of daily weights Low sodium diet Fluid restriction Medication management Anticipated future follow-up appointments  Patient education given on each of the above topics.   Patient acknowledges understanding via teach back method and acceptance of all instructions.  Education Materials:  "Living Better With Heart Failure" Booklet, HF zone tool, & Daily Weight Tracker Tool.  Patient has scale at home: no ( will get one) Patient has pill box at home: no    High Risk Criteria for Readmission and/or Poor Patient Outcomes: Heart failure hospital admissions (last 6 months): 0  No Show rate: 0 Difficult social situation: no Demonstrates medication adherence: yes Primary Language: English Literacy level: comprehension and writing  Barriers of Care:   Diet and fluid restriction  Considerations/Referrals:   Referral made to Heart Failure Pharmacist Stewardship: yes Referral made to Heart Failure CSW/NCM TOC: no Referral made to Heart & Vascular TOC clinic: yes   Items for Follow-up on DC/TOC: Diet and fluid restriction   Marston Mccadden,RN, BSN,MSN Heart Failure Nurse Navigator. Contact by secure chat only.

## 2022-09-22 NOTE — Progress Notes (Signed)
Occupational Therapy Treatment Patient Details Name: Linda Chase MRN: [id number] DOB: [birth year] Today's Date: 09/22/2022   History of present illness Linda Chase is a 67 y.o. female who presented to [facility] ED with complaints of lower back pain however due to respiratory difficulty placed on biPap and then transferred to [facility]. HR also in 130s in a-fib. PMH: HTN, paroxysmal a-fib on chronic anticoagulation, DM II, cerebral aneurysm, and morbid obesity   OT comments  Pt at this time completed UE/LE dressing and sponge bath in session. Pt needed min guard to min assist to complete task with increase in time and multiple seated rest breaks in session. Pt was advised about use of walker at home and also educated about home modifications to assist in use of RW use in the home due to episodes of being unsteady in session. Pt also educated about energy conservation and DME use in session. Pt needs continued acute  Occupational Therapy to maximize independence at home. Pt agreeable to [facility] therapies at this time but recommendation would benefit from continued inpatient follow up therapy, <3 hours/day.         If plan is discharge home, recommend the following:  A little help with walking and/or transfers;A lot of help with bathing/dressing/bathroom;Direct supervision/assist for medications management;Help with stairs or ramp for entrance;Assist for transportation;Direct supervision/assist for financial management;Assistance with cooking/housework   Equipment Recommendations  BSC/3in1;Tub/shower seat (RW)    Recommendations for Other Services      Precautions / Restrictions Precautions Precautions: Fall Precaution Comments: watch SpO2 and HR Restrictions Weight Bearing Restrictions: No       Mobility Bed Mobility Overal bed mobility:  (Pt presented while in chair)                  Transfers Overall transfer level: Needs assistance Equipment used: Rolling  walker (2 wheels) Transfers: Sit to/from Stand Sit to Stand: Contact guard assist           General transfer comment: Pt used RW in session and educated about use when at home and how to modify home enviroment to allow space for walker and when pt attempted to not use walker in session had a postural sway.     Balance Overall balance assessment: Needs assistance Sitting-balance support: Feet supported, No upper extremity supported Sitting balance-Leahy Scale: Good Sitting balance - Comments:  (sitting in chair)   Standing balance support: Single extremity supported, Bilateral upper extremity supported, During functional activity Standing balance-Leahy Scale: Good Standing balance comment: RW with mobility, forearms when completion of ADLS at sink                           ADL either performed or assessed with clinical judgement   ADL Overall ADL's : Needs assistance/impaired Eating/Feeding: Modified independent;Sitting   Grooming: Wash/dry hands;Wash/dry face;Oral care;Contact guard assist;Standing;Sitting   Upper Body Bathing: Set up;Sitting   Lower Body Bathing: Contact guard assist;Sit to/from stand   Upper Body Dressing : Set up;Sitting   Lower Body Dressing: Moderate assistance;Sit to/from stand   Toilet Transfer: Contact guard assist;Rolling walker (2 wheels)   Toileting- Clothing Manipulation and Hygiene: Minimal assistance;Sit to/from stand       Functional mobility during ADLs: Contact guard assist;Rolling walker (2 wheels)      Extremity/Trunk Assessment Upper Extremity Assessment Upper Extremity Assessment: Generalized weakness   Lower Extremity Assessment Lower Extremity Assessment: Defer to PT evaluation  Vision   Vision Assessment?: No apparent visual deficits   Perception Perception Perception: Not tested   Praxis Praxis Praxis: Not tested    Cognition Arousal: Alert Behavior During Therapy: WFL for tasks  assessed/performed Overall Cognitive Status: No family/caregiver present to determine baseline cognitive functioning Area of Impairment: Following commands, Safety/judgement, Awareness, Problem solving                       Following Commands: Follows one step commands consistently, Follows multi-step commands with increased time Safety/Judgement: Decreased awareness of safety, Decreased awareness of deficits Awareness: Emergent Problem Solving: Slow processing, Difficulty sequencing, Requires verbal cues, Requires tactile cues General Comments: Pt could not recall in sequencing of what they cleaned when completion of sponge bath        Exercises      Shoulder Instructions       General Comments      Pertinent Vitals/ Pain       Pain Assessment Pain Assessment: Faces Faces Pain Scale: Hurts a little bit Breathing: normal Pain Location: back Pain Descriptors / Indicators: Discomfort, Grimacing Pain Intervention(s): Limited activity within patient's tolerance, Monitored during session, Repositioned  Home Living                                          Prior Functioning/Environment              Frequency  Min 1X/week        Progress Toward Goals  OT Goals(current goals can now be found in the care plan section)  Progress towards OT goals: Progressing toward goals  Acute Rehab OT Goals Patient Stated Goal: to go home OT Goal Formulation: With patient Time For Goal Achievement: 10/04/22 Potential to Achieve Goals: Fair ADL Goals Pt Will Perform Grooming: with set-up;standing Pt Will Perform Lower Body Bathing: with supervision;with adaptive equipment;sit to/from stand Pt Will Perform Lower Body Dressing: with supervision;sit to/from stand;with adaptive equipment Pt Will Transfer to Toilet: with supervision;bedside commode;ambulating Pt Will Perform Toileting - Clothing Manipulation and hygiene: with supervision;with adaptive  equipment;sit to/from stand Pt Will Perform Tub/Shower Transfer: with min assist;tub bench;Tub transfer Additional ADL Goal #1: Pt will independently verbalize at least two energy conservation strategies for selfcare tasks.  Plan      Co-evaluation                 AM-PAC OT "6 Clicks" Daily Activity     Outcome Measure   Help from another person eating meals?: None Help from another person taking care of personal grooming?: A Little Help from another person toileting, which includes using toliet, bedpan, or urinal?: A Little Help from another person bathing (including washing, rinsing, drying)?: A Little Help from another person to put on and taking off regular upper body clothing?: A Little Help from another person to put on and taking off regular lower body clothing?: A Little 6 Click Score: 19    End of Session Equipment Utilized During Treatment: Rolling walker (2 wheels)  OT Visit Diagnosis: Unsteadiness on feet (R26.81);Other abnormalities of gait and mobility (R26.89);Muscle weakness (generalized) (M62.81);Pain;Other symptoms and signs involving cognitive function Pain - part of body:  (back)   Activity Tolerance Patient tolerated treatment well   Patient Left in chair;with call bell/phone within reach   Nurse Communication Mobility status        Time: [phone]  OT Time Calculation (min): 52 min  Charges: OT General Charges $OT Visit: 1 Visit OT Treatments $Self Care/Home Management : 38-52 mins  Presley Raddle OTR/L  Acute Rehab Services  [phone] office number   Alphia Moh 09/22/2022, 9:47 AM

## 2022-09-23 NOTE — Discharge Summary (Signed)
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 F with MO, OSA not on CPAP, pAF on Eliquis, COPD, DM, HTN, hx cerebral aneurysm s/p clipping in 2006 in [place] who presented with over a month of progressive mid back pain, finally severe. Shortly after admission, developed respiratory failure due to cor pulmonale and CHF.    During workup for respiratory disease, incidentally found to have thoracic endplate changes concerning for possible osteomyelitis.  Patient admitted for further management.    Patient diuresed well, denies any worsening shortness of breath.  Stable to be discharged to follow-up with PCP and cardiology.    Assessment and Plan:  Acute on chronic respiratory failure with hypercapnia and hypoxia  Obesity hypoventilation syndrome Acute on chronic cor pulmonale Acute on chronic heart failure with preserved ejection fraction Obstructive sleep apnea Pulmonary hypertension   No prior CHF history until recent  admission in [place]  Echocardiogram showed EF 50%, signs of right heart overload Started on diuretics and diuresed ~ 24L Discharged on oral Lasix 80 BID Outpt follow up with cardiology/PCP Polysomnogram/sleep study referral by PCP   Abnormal imaging of thoracic spine There was some initial concern given her leukocytosis and endplate changes that she had vertebral osteomyelitis.  Blood cultures negative, aspirate of the disc space negative for growth to date. Extensive effort was made to obtain records from her hospital and doctors in [place] regarding her 2006 aneurysm clips to see if there were MRI compatible, but no definitive information could be found and so we could not perform MRI. Given no growth on aspiration, ID recommended close follow-up in the office for CRP/ESR, WBC, and possibly repeat imaging Outpatient follow-up with Dr. Elinor Parkinson 9/24 at 4pm   Paroxysmal atrial fibrillation with RVR (HCC) Started on amiodarone here, continue amiodarone 200 BID until Sep 22 then transition to 200 daily Continue metoprolol, Eliquis Follow up with cardiology   OSA (obstructive sleep apnea) Polysomnogram/sleep study referral by PCP   Coronary artery disease involving native coronary artery of native heart without angina pectoris Continue atorvastatin and metoprolol   Hypertension BP stable Continue metoprolol, furosemide Stop home HCTZ, amlodipine   Diabetes mellitus (HCC) Continue gabapentin, metformin and Januvia   Morbid Obesity       Consultants: Cardiology Procedures performed: None Disposition: Home Diet recommendation:  Cardiac and Carb modified diet   DISCHARGE MEDICATION: Allergies as of 09/22/2022       Reactions   Hydrocodone Nausea And Vomiting        Medication List     STOP  taking these medications    amLODipine 5 MG tablet Commonly known as: NORVASC   hydrochlorothiazide 25 MG tablet Commonly known as: HYDRODIURIL       TAKE these  medications    albuterol 108 (90 Base) MCG/ACT inhaler Commonly known as: VENTOLIN HFA Inhale 1-2 puffs into the lungs as needed for wheezing or shortness of breath.   amiodarone 200 MG tablet Commonly known as: PACERONE Take 1 tablet (200 mg total) by mouth 2 (two) times daily.   atorvastatin 20 MG tablet Commonly known as: LIPITOR Take 20 mg by mouth daily.   CALCIUM/VITAMIN D3/ADULT GUMMY PO GUMMY VITAMIN D 2000   cholecalciferol 1000 units tablet Commonly known as: VITAMIN D Take 2,000 Units by mouth daily. Gummies   Eliquis 5 MG Tabs tablet Generic drug: apixaban Take 1 tablet (5 mg total) by mouth 2 (two) times daily.   furosemide 80 MG tablet Commonly known as: LASIX Take 1 tablet (80 mg total) by mouth 2 (two) times daily. What changed:  medication strength how much to take when to take this   gabapentin 300 MG capsule Commonly known as: NEURONTIN Take 300 mg by mouth at bedtime.   Januvia 25 MG tablet Generic drug: sitaGLIPtin Take 25 mg by mouth daily.   metFORMIN 1000 MG tablet Commonly known as: GLUCOPHAGE Take 1,000 mg by mouth 2 (two) times daily with a meal.   metoprolol succinate 100 MG 24 hr tablet Commonly known as: TOPROL-XL Take 1 tablet (100 mg total) by mouth daily. Take with or immediately following a meal. What changed:  medication strength how much to take when to take this additional instructions   potassium chloride 10 MEQ tablet Commonly known as: KLOR-CON M Take 2 tablets (20 mEq total) by mouth daily for 14 days. What changed: how much to take   Spiriva HandiHaler 18 MCG inhalation capsule Generic drug: tiotropium Place 18 mcg into inhaler and inhale daily.        Follow-up Information     [facility] Follow up.   Specialty: Home Health Services Contact information: [address] [place] [address] [phone]         [facility]. Go in  14 day(s).   Specialty: Cardiology Why: post hospital follow up please bring a list of current medication with you free valet parking, entrance [address] Sept. 25,2024 @12N  Contact information: [address] [place] [address] [phone]               Discharge Exam: Ceasar Mons Weights   09/19/22 0200 09/20/22 0543 09/22/22 0903  Weight: 129.6 kg 129.3 kg 129.9 kg   General: NAD  Cardiovascular: S1, S2 present Respiratory: CTAB Abdomen: Soft, nontender, nondistended, bowel sounds present Musculoskeletal: No bilateral pedal edema noted Skin: Normal Psychiatry: Normal mood   Condition at discharge: stable  The results of significant diagnostics from this hospitalization (including imaging, microbiology, ancillary and laboratory) are listed below for reference.   Imaging Studies: CT THORACIC SPINE W CONTRAST  Result Date: 09/21/2022 CLINICAL DATA:  Possible discitis-osteomyelitis EXAM: CT THORACIC SPINE WITH CONTRAST TECHNIQUE: Multidetector CT images of thoracic was performed according to the standard protocol following intravenous contrast administration. RADIATION DOSE REDUCTION: This exam was performed according to the departmental dose-optimization program which includes automated exposure control, adjustment of the mA and/or kV according to patient size and/or use of iterative reconstruction technique. CONTRAST:  75mL OMNIPAQUE IOHEXOL 350 MG/ML  SOLN COMPARISON:  Chest CT 09/08/2022 FINDINGS: Alignment: Normal Vertebrae: Unchanged endplate remodeling of the opposing T8-9 surfaces. This is favored to be degenerative. No acute osseous abnormality. Paraspinal and other soft tissues: Cardiomegaly Disc levels: No high-grade spinal canal stenosis. IMPRESSION: 1. Unchanged T8-9 endplate remodeling, favored to be degenerative. 2. No acute osseous abnormality. Electronically Signed   By: Deatra Robinson M.D.   On: 09/21/2022 01:37   IR THORACIC DISC  ASPIRATION W/IMG GUIDE  Result Date: 09/17/2022 INDICATION: Marshanna Abdelnour is a 68 y.o. female with PMH significant arthritis, atrial fibrillation, cerebral aneurysm x2, depression, diabetes mellitus, and hypertension being seen today in relation to suspected discitis/osteomyelitis. Patient was admitted to [facility] on 09/06/22 after being found to be in acute respiratory failure. CT on 09/06/22 noted findings of pulmonary nodules, which prompted follow-up CT Chest on 8/28 which revealed concern for discitis/osteomyelitis at T8-T9. MRI is unable to be obtained due to aneurysm clips of unknown MR compatibility. They were consulted for image-guided T8-T9 disc aspiration. Attempt to perform the procedure on September 14, 2022. However, patient could not tolerate lying on the procedure table. She returns today for disc aspiration under monitored anesthesia care. EXAM: FLUOROSCOPY GUIDED T8-9 FINE-NEEDLE DISC ASPIRATION MEDICATIONS: No antibiotics administered. ANESTHESIA/SEDATION: The procedure was performed under monitored anesthesia care (MAC). COMPLICATIONS: None immediate. FLUOROSCOPY: DAP 19741.04 mGym2 PROCEDURE: Informed written consent was obtained from the patient after a thorough discussion of the procedural risks, benefits and alternatives. All questions were addressed. Maximal Sterile Barrier Technique was utilized including caps, mask, sterile gowns, sterile gloves, sterile drape, hand hygiene and skin antiseptic. A timeout was performed prior to the initiation of the procedure. The patient was placed in prone position on the angiography table. The thoracic spine region was prepped and draped in a sterile fashion. Under fluoroscopy, the T8-9 disc space was delineated and the skin area was marked. The skin was infiltrated with a 1% Lidocaine approximately 5 cm lateral to the spinous process projection on the left. Using a 22-gauge spinal needle, the soft issue and the peripedicular space were infiltrated with  Bupivacaine 0.5%. Subsequently, a 20 gauge spinal needle was advanced under fluoroscopy guided into the T8-9 disc space. The mandrel was removed and 2 fine-needle aspiration samples were obtained. Small amount of blood tinged fluid was collected in each sample. The needle was subsequently withdrawn. The access sites were cleaned and covered with a sterile bandage. IMPRESSION: Successful fluoroscopy guided T8-9 intervertebral disc fine-needle aspiration. Electronically Signed   By: Baldemar Lenis M.D.   On: 09/17/2022 10:49   IR THORACIC DISC ASPIRATION W/IMG GUIDE  Result Date: 09/15/2022 INDICATION: Torina Thrush is a 67 y.o. female with PMH significant arthritis, atrial fibrillation, cerebral aneurysm x2, depression, diabetes mellitus, and hypertension being seen today in relation to suspected discitis/osteomyelitis. Patient was admitted to [facility] on 09/06/22 after being found to be in acute respiratory failure. CT on 09/06/22 noted findings of pulmonary nodules, which prompted follow-up CT Chest on 8/28 which revealed concern for discitis/osteomyelitis at T8-T9. MRI is unable to be obtained due to aneurysm clips of unknown MR compatibility. IR was subsequently consulted for image-guided T8-T9 disc aspiration. EXAM: FLUOROSCOPY GUIDED T8-T9 DISC ASPIRATION MEDICATIONS: None. ANESTHESIA/SEDATION: None. COMPLICATIONS: None immediate. FLUOROSCOPY: No radiation exposure performed. PROCEDURE: Informed written consent was obtained from the patient after a thorough discussion of the procedural risks, benefits and alternatives. All questions were addressed. Patient was transferred to the angiography table. However, she was unable to tolerate lying  on her stomach for the intervention. Procedure was, therefore, aborted. IMPRESSION: Patient unable to tolerate positioning in the procedural table. Procedure was aborted. Electronically Signed   By: Baldemar Lenis M.D.   On: 09/15/2022 14:09    CT HEAD WO CONTRAST ( )  Result Date: 09/10/2022 CLINICAL DATA:  CT for MRI clearance. EXAM: CT HEAD WITHOUT CONTRAST TECHNIQUE: Contiguous axial images were obtained from the base of the skull through the vertex without intravenous contrast. RADIATION DOSE REDUCTION: This exam was performed according to the departmental dose-optimization program which includes automated exposure control, adjustment of the mA and/or kV according to patient size and/or use of iterative reconstruction technique. COMPARISON:  None Available. FINDINGS: Brain: Cystic encephalomalacia in the right parieto-occipital region. Encephalomalacia in the anterior right temporal lobe. No evidence of acute large vascular territory infarct, acute hemorrhage, mass lesion, midline shift or hydrocephalus. Vascular: Aneurysm clips in the right paraclinoid region. Skull: Right-sided craniotomy.  No evidence of acute fracture. Sinuses/Orbits: No sub clear sinuses.  No acute orbital findings. Other: No mastoid effusions. IMPRESSION: 1. Positive for aneurysm clips. MRI is likely contraindicated, but recommend correlation with prior surgical note and exact type of clip if available. 2. No evidence of acute intracranial abnormality. 3. Encephalomalacia in the right parieto-occipital region and anterior right temporal lobe. Electronically Signed   By: Feliberto Harts M.D.   On: 09/10/2022 17:34   DG CHEST PORT 1 VIEW  Result Date: 09/09/2022 CLINICAL DATA:  CHF EXAM: PORTABLE CHEST 1 VIEW COMPARISON:  CXR 09/06/22 FINDINGS: Cardiomegaly. No pleural effusion. No pneumothorax. No focal airspace opacity. No radiographically apparent displaced rib fractures. Visualized upper abdomen unremarkable. Mild pulmonary venous congestion IMPRESSION: Cardiomegaly with mild pulmonary venous congestion. No evidence of pleural effusion or overt pulmonary edema. Electronically Signed   By: Lorenza Cambridge M.D.   On: 09/09/2022 13:34   CT CHEST W CONTRAST  Result  Date: 09/08/2022 CLINICAL DATA:  Pulmonary nodules EXAM: CT CHEST WITH CONTRAST TECHNIQUE: Multidetector CT imaging of the chest was performed during intravenous contrast administration. RADIATION DOSE REDUCTION: This exam was performed according to the departmental dose-optimization program which includes automated exposure control, adjustment of the mA and/or kV according to patient size and/or use of iterative reconstruction technique. CONTRAST:  75mL OMNIPAQUE IOHEXOL 350 MG/ML SOLN COMPARISON:  Chest radiograph dated 09/06/2018. CT chest dated 02/01/2012. FINDINGS: Cardiovascular: The heart is top-normal in size. No pericardial effusion. No evidence of thoracic aneurysm. Atherosclerotic calcifications of the aortic arch. Moderate coronary atherosclerosis of the LAD and left coronary artery. Mediastinum/Nodes: Small mediastinal nodes, including a 17 mm short axis low right paratracheal node (series 3/image 84) and 14 mm short axis right hilar/perihilar nodes. Visualized thyroid is unremarkable. Lungs/Pleura: Evaluation of the lung parenchyma is constrained by respiratory motion. Within that constraint, there are no suspicious pulmonary nodules. Specifically, the right basilar nodular opacity noted on recent prior CT is not evident on the current study. Mild patchy right lower lobe opacity, favoring atelectasis, with suspected trace right pleural effusion. New 10 mm opacity at the medial left lung base likely reflects additional atelectasis rather than a true nodule given rapid interval change. No pneumothorax. Upper Abdomen: Visualized upper abdomen is grossly unremarkable, noting vascular calcifications. Musculoskeletal: Degenerative changes of the visualized thoracolumbar spine. Destructive endplate changes at T8-9 with suspected paravertebral soft tissue (series 3/image 83), raising the possibility of discitis osteomyelitis. IMPRESSION: Destructive endplate changes at T8-9 with suspected paravertebral soft  tissue, raising the possibility of discitis osteomyelitis. MRI of  the thoracic spine with and without contrast is suggested for further evaluation. No suspicious pulmonary nodules. Specifically, the right basilar nodular opacity noted on recent prior CT is not evident on the current study. Mild patchy right lower lobe opacity, favoring atelectasis, with suspected trace right pleural effusion. Small mediastinal and right hilar/perihilar nodes, possibly reactive. Follow-up CT chest is suggested in 3 months. Aortic Atherosclerosis (ICD10-I70.0). Electronically Signed   By: Charline Bills M.D.   On: 09/08/2022 16:48   ECHOCARDIOGRAM COMPLETE  Result Date: 09/07/2022    ECHOCARDIOGRAM REPORT   Patient Name:   Karle Starch Date of Exam: 09/07/2022 Medical Rec #:  [id number]           Height:       64.0 in Accession #:    [id number]          Weight:       300.2 lb Date of Birth:  06/02/1955          BSA:          2.325 m Patient Age:    66 years            BP:           111/78 mmHg Patient Gender: F                   HR:           101 bpm. Exam Location:  Inpatient Procedure: 2D Echo, 3D Echo, Cardiac Doppler and Color Doppler Indications:    I48.91* Unspeicified atrial fibrillation  History:        Patient has prior history of Echocardiogram examinations, most                 recent 02/02/2012. Abnormal ECG, COPD, Arrythmias:Atrial                 Fibrillation; Risk Factors:Diabetes and Hypertension.  Sonographer:    Sheralyn Boatman RDCS Referring Phys: Clydie Braun  Sonographer Comments: Patient is obese. Image acquisition challenging due to patient body habitus. Study delayed 20 minutes to get patient back in bed IMPRESSIONS  1. Left ventricular ejection fraction, by estimation, is 50%. Left ventricular ejection fraction by 3D volume is 49 %. The left ventricle has mildly decreased function. The left ventricle demonstrates global hypokinesis. There is mild left ventricular hypertrophy. Left ventricular  diastolic parameters are indeterminate. There is the interventricular septum is flattened in systole, consistent with right ventricular pressure overload.  2. Right ventricular systolic function is mildly reduced. The right ventricular size is mildly enlarged. Mildly increased right ventricular wall thickness. There is mildly elevated pulmonary artery systolic pressure. The estimated right ventricular systolic pressure is 44.8 mmHg.  3. Left atrial size was mildly dilated.  4. Right atrial size was mildly dilated.  5. The mitral valve is grossly normal. Trivial mitral valve regurgitation. No evidence of mitral stenosis. Moderate mitral annular calcification.  6. There is one beat of systolic flow reversal of hepatic vein spectral Doppler. Tricuspid valve regurgitation is moderate to severe.  7. Nodular calcification of leaflet tips. The aortic valve is tricuspid. There is mild calcification of the aortic valve. Aortic valve regurgitation is trivial. No aortic stenosis is present.  8. Aortic dilatation noted. There is mild dilatation of the ascending aorta, measuring 42 mm.  9. The inferior vena cava is dilated in size with <50% respiratory variability, suggesting right atrial pressure of 15 mmHg. Comparison(s): Unable to view prior study. FINDINGS  Left Ventricle: Left ventricular ejection fraction, by estimation, is 50%. Left ventricular ejection fraction by 3D volume is 49 %. The left ventricle has mildly decreased function. The left ventricle demonstrates global hypokinesis. The left ventricular internal cavity size was normal in size. There is mild left ventricular hypertrophy. The interventricular septum is flattened in systole, consistent with right ventricular pressure overload. Left ventricular diastolic parameters are indeterminate. Right Ventricle: The right ventricular size is mildly enlarged. Mildly increased right ventricular wall thickness. Right ventricular systolic function is mildly reduced. There  is mildly elevated pulmonary artery systolic pressure. The tricuspid regurgitant velocity is 2.73 m/s, and with an assumed right atrial pressure of 15 mmHg, the estimated right ventricular systolic pressure is 44.8 mmHg. Left Atrium: Left atrial size was mildly dilated. Right Atrium: Right atrial size was mildly dilated. Pericardium: There is no evidence of pericardial effusion. Mitral Valve: The mitral valve is grossly normal. Moderate mitral annular calcification. Trivial mitral valve regurgitation. No evidence of mitral valve stenosis. Tricuspid Valve: There is one beat of systolic flow reversal of hepatic vein spectral Doppler. The tricuspid valve is grossly normal. Tricuspid valve regurgitation is moderate to severe. No evidence of tricuspid stenosis. Aortic Valve: Nodular calcification of leaflet tips. The aortic valve is tricuspid. There is mild calcification of the aortic valve. Aortic valve regurgitation is trivial. No aortic stenosis is present. Pulmonic Valve: The pulmonic valve was normal in structure. Pulmonic valve regurgitation is not visualized. No evidence of pulmonic stenosis. Aorta: Aortic dilatation noted. There is mild dilatation of the ascending aorta, measuring 42 mm. Venous: The inferior vena cava is dilated in size with less than 50% respiratory variability, suggesting right atrial pressure of 15 mmHg. IAS/Shunts: No atrial level shunt detected by color flow Doppler.  LEFT VENTRICLE PLAX 2D LVIDd:         4.60 cm LVIDs:         3.50 cm LV PW:         1.60 cm         3D Volume EF LV IVS:        1.20 cm         LV 3D EF:    Left                                             ventricul                                             ar LV Volumes (MOD)                            ejection LV vol d, MOD    69.4 ml                    fraction A2C:                                        by 3D LV vol d, MOD    64.8 ml                    volume is A4C:  49 %. LV vol s,  MOD    28.0 ml A2C: LV vol s, MOD    29.9 ml       3D Volume EF: A4C:                           3D EF:        49 % LV SV MOD A2C:   41.4 ml       LV EDV:       170 ml LV SV MOD A4C:   64.8 ml       LV ESV:       87 ml LV SV MOD BP:    39.9 ml       LV SV:        83 ml RIGHT VENTRICLE             IVC RV S prime:     13.30 cm/s  IVC diam: 2.70 cm RVOT diam:      2.90 cm TAPSE (M-mode): 1.9 cm LEFT ATRIUM             Index        RIGHT ATRIUM           Index LA diam:        4.90 cm 2.11 cm/m   RA Area:     24.80 cm LA Vol (A2C):   64.5 ml 27.74 ml/m  RA Volume:   78.20 ml  33.64 ml/m LA Vol (A4C):   68.1 ml 29.29 ml/m LA Biplane Vol: 67.9 ml 29.20 ml/m  AORTIC VALVE LVOT Vmax:   136.50 cm/s LVOT Vmean:  93.500 cm/s LVOT VTI:    0.210 m  AORTA Ao Root diam: 3.10 cm Ao Asc diam:  4.15 cm MITRAL VALVE                TRICUSPID VALVE MV Area (PHT): 3.99 cm     TR Peak grad:   29.8 mmHg MV Decel Time: 190 msec     TR Vmax:        273.00 cm/s MV E velocity: 134.00 cm/s                             SHUNTS                             Systemic VTI:  0.21 m                             Pulmonic Diam: 2.90 cm Riley Lam MD Electronically signed by Riley Lam MD Signature Date/Time: 09/07/2022/1:29:37 PM    Final    CT ABDOMEN PELVIS W CONTRAST  Result Date: 09/06/2022 CLINICAL DATA:  Abdominal and flank pain. EXAM: CT ABDOMEN AND PELVIS WITH CONTRAST TECHNIQUE: Multidetector CT imaging of the abdomen and pelvis was performed using the standard protocol following bolus administration of intravenous contrast. RADIATION DOSE REDUCTION: This exam was performed according to the departmental dose-optimization program which includes automated exposure control, adjustment of the mA and/or kV according to patient size and/or use of iterative reconstruction technique. CONTRAST:  OMNIPAQUE IOHEXOL 300 MG/ML  SOLN COMPARISON:  Limited comparison is available with CTA chest 02/01/2012. Portable chest has also  been performed today. FINDINGS: Lower chest: The heart is moderately enlarged. There  is no pericardial effusion. Small hiatal hernia. There are three-vessel coronary calcifications greatest in the LAD. Scattered calcifications in the aortic valve leaflets. There are posterior atelectatic changes. Newly noted in the right lower lobe on 4:13 adjacent the posterior crest of the hemidiaphragm, there is a 1.6 cm nodular opacity. Also, at base of the right upper lobe on 4:3 there is a noncalcified 6 mm nodule not seen previously. Prompt chest CT is recommended to assess for additional nodules. Remaining lung bases are clear. Hepatobiliary: Respiratory motion limits fine detail. No liver mass is seen through the breathing motion. The liver is 21 cm length and mildly steatotic. The gallbladder and bile ducts are unremarkable. Pancreas: No abnormality is seen through breathing motion. Spleen: No abnormality is seen through the breathing motion. No splenomegaly. Adrenals/Urinary Tract: Also with limited fine detail in the kidneys due to breathing motion. No focal abnormality is suspected in the adrenal glands and kidneys. There is a 3 mm caliceal stone in the inferior pole of the right kidney. No left nephrolithiasis or other right renal stones are seen. There is no hydronephrosis or ureteral stone. The bladder is unremarkable for the degree of distention. Stomach/Bowel: Unremarkable stomach, unremarkable unopacified small bowel. An appendix is not seen in this patient. There is moderate retained stool ascending and transverse colon. Left-sided diverticulosis but no evidence of acute diverticulitis. Vascular/Lymphatic: Aortic atherosclerosis. No enlarged abdominal or pelvic lymph nodes. There are however, multiple bilateral enlarged inguinal chain nodes, largest on the right is 2.3 cm in short axis and the largest on the left is 2 cm in short axis. Etiology indeterminate. Reproductive: The uterus is intact. There is a 1.9 cm  calcified subserosal fibroid to the right along the fundus. No adnexal mass is seen. Multiple pelvic phleboliths. Other: Small umbilical and inguinal fat hernias. Mild body wall edema. Stranding in the lower abdominal wall is seen and could be part of the bilateral body wall edema or could be due to cellulitis. There is no free fluid, free hemorrhage or free air, or incarcerated hernia. Musculoskeletal: Advanced L4-5 facet hypertrophy, acquired spinal canal stenosis and grade 1 L4-5 spondylolisthesis are noted present. Degenerative changes in visualized lower thoracic spine. No acute or other significant osseous findings. IMPRESSION: 1. 1.6 cm nodular opacity in the right lower lobe and 6 mm nodule in the right upper lobe. Prompt chest CT is recommended to assess for additional nodules. 2. Cardiomegaly with calcific CAD and aortic atherosclerosis. 3. Small hiatal hernia. 4. Constipation and diverticulosis. 5. Nonobstructive right-sided nephrolithiasis. No obstructing stones. 6. Bilateral inguinal chain adenopathy of uncertain etiology. 7. Lower abdominal wall stranding which could be part of the bilateral body wall edema or could be due to cellulitis. 8. Small umbilical and inguinal fat hernias. 9. Advanced L4-5 facet hypertrophy, acquired spinal canal stenosis and grade 1 L4-5 spondylolisthesis. 10. Mild hepatic steatosis. Aortic Atherosclerosis (ICD10-I70.0). Electronically Signed   By: Almira Bar M.D.   On: 09/06/2022 05:16   CT L-SPINE NO CHARGE  Result Date: 09/06/2022 CLINICAL DATA:  Abdominal/flank pain with stone suspected EXAM: CT Lumbar Spine with contrast TECHNIQUE: Technique: Multiplanar CT images of the lumbar spine were reconstructed from contemporary CT of the Abdomen and Pelvis. RADIATION DOSE REDUCTION: This exam was performed according to the departmental dose-optimization program which includes automated exposure control, adjustment of the mA and/or kV according to patient size and/or use  of iterative reconstruction technique. CONTRAST:  None additional COMPARISON:  None Available. FINDINGS: Segmentation: 5 lumbar type vertebrae.  Alignment: Mild degenerative anterolisthesis at L4-5 and retrolisthesis at L5-S1. Vertebrae: No acute fracture or focal pathologic process. Paraspinal and other soft tissues: Reported separately Disc levels: T12- L1: Facet and costovertebral spurring eccentric to the left with mild foraminal encroachment. L1-L2: Mild disc bulging and facet spurring L2-L3: Mild disc bulging and facet spurring L3-L4: Moderate degenerative facet hypertrophy. Circumferential disc bulging. Moderate spinal and biforaminal stenosis L4-L5: Advanced facet osteoarthritis with spurring and anterolisthesis. The disc is narrowed and bulging. Advanced appearing spinal and biforaminal stenosis L5-S1:Moderate degenerative facet spurring asymmetric to the left. Mild disc bulging and mild left foraminal narrowing. IMPRESSION: 1. No acute finding. 2. Generalized lumbar spine degeneration especially affecting lower lumbar facets with L4-5 and L5-S1 anterolisthesis. 3. L4-5 high-grade spinal stenosis. 4. Foraminal impingement suspected at both L3-4 and L4-5. Electronically Signed   By: Tiburcio Pea M.D.   On: 09/06/2022 04:56   DG Chest Portable 1 View  Result Date: 09/06/2022 CLINICAL DATA:  Shortness of breath EXAM: PORTABLE CHEST 1 VIEW COMPARISON:  04/06/2013 FINDINGS: Cardiac shadow is mildly enlarged. Lungs are clear bilaterally. No bony abnormality is noted. IMPRESSION: No active disease. Electronically Signed   By: Alcide Clever M.D.   On: 09/06/2022 03:13    Microbiology: Results for orders placed or performed during the hospital encounter of 09/06/22  Culture, blood (Routine X 2) w Reflex to ID Panel     Status: None   Collection Time: 09/09/22 10:48 AM   Specimen: BLOOD LEFT HAND  Result Value Ref Range Status   Specimen Description BLOOD LEFT HAND  Final   Special Requests   Final     BOTTLES DRAWN AEROBIC AND ANAEROBIC Blood Culture adequate volume   Culture   Final    NO GROWTH 5 DAYS Performed at [facility], [address] [place], [place] [address]    Report Status 09/14/2022 FINAL  Final  Culture, blood (Routine X 2) w Reflex to ID Panel     Status: None   Collection Time: 09/09/22 10:51 AM   Specimen: BLOOD LEFT HAND  Result Value Ref Range Status   Specimen Description BLOOD LEFT HAND  Final   Special Requests   Final    BOTTLES DRAWN AEROBIC AND ANAEROBIC Blood Culture adequate volume   Culture   Final    NO GROWTH 5 DAYS Performed at [facility], [address] [place], [place] [address]    Report Status 09/14/2022 FINAL  Final  SARS Coronavirus 2 by RT PCR (hospital order, performed in [facility] hospital lab) *cepheid single result test* Anterior Nasal Swab     Status: None   Collection Time: 09/10/22  8:45 AM   Specimen: Anterior Nasal Swab  Result Value Ref Range Status   SARS Coronavirus 2 by RT PCR NEGATIVE NEGATIVE Final    Comment: Performed at [facility], [address], [place], [place] [address]  Body fluid culture w Gram Stain     Status: None   Collection Time: 09/16/22  8:51 AM   Specimen: PATH Disc; Body Fluid  Result Value Ref Range Status   Specimen Description FLUID  Final   Special Requests BODY FLUID  Final   Gram Stain NO WBC SEEN NO ORGANISMS SEEN   Final   Culture   Final    NO GROWTH 3 DAYS Performed at [facility], [address] [place], [place] [address]    Report Status 09/19/2022 FINAL  Final  Culture,  fungus without smear     Status: None (Preliminary result)   Collection Time: 09/19/22  2:49 PM   Specimen: Back; Other  Result Value Ref Range Status   Specimen Description BACK  Final   Special Requests 9 SPINE T8  Final   Culture   Final    NO GROWTH 4 DAYS Performed at [facility], [address] [place], [place] [address]    Report Status PENDING  Incomplete  Acid  Fast Smear (AFB)     Status: None   Collection Time: 09/19/22  2:50 PM   Specimen: Aspirate; Other  Result Value Ref Range Status   AFB Specimen Processing Concentration  Final   Acid Fast Smear Negative  Final    Comment: (NOTE) Performed At: [facility] [address] [place], [place] [id number] Jolene Schimke MD XB:[id number]    Source (AFB) 9  Final    Comment: SPINE T8 Performed at [facility], [address], [place], [place] [address]     Labs: CBC: Recent Labs  Lab 09/17/22 0223 09/18/22 0227 09/21/22 0740 09/22/22 0243  WBC 8.7 9.1 9.1 9.6  HGB 9.7* 11.0* 10.7* 10.6*  HCT 34.2* 38.5 37.2 36.2  MCV 83.6 83.7 83.2 84.4  PLT 357 292 395 325   Basic Metabolic Panel: Recent Labs  Lab 09/18/22 0226 09/18/22 0659 09/19/22 0215 09/20/22 0227 09/21/22 0740 09/22/22 0243  NA  --  138 138 138 136 135  K  --  3.4* 3.9 4.2 4.5 4.7  CL  --  92* 97* 99 99 99  CO2  --  35* 32 30 26 27   GLUCOSE  --  172* 185* 153* 165* 141*  BUN  --  18 19 21 21  28*  CREATININE  --  1.13* 1.17* 1.19* 1.16* 1.15*  CALCIUM  --  8.6* 8.5* 8.6* 8.9 8.3*  MG 2.2  --  2.3 2.3  --   --    Liver Function Tests: No results for input(s): "AST", "ALT", "ALKPHOS", "BILITOT", "PROT", "ALBUMIN" in the last 168 hours. CBG: Recent Labs  Lab 09/21/22 1056 09/21/22 1705 09/21/22 2121 09/22/22 0634 09/22/22 1059  GLUCAP 166* 187* 208* 141* 177*    Discharge time spent: greater than 30 minutes.  Signed: Briant Cedar, MD [facility] 09/23/2022

## 2022-09-23 NOTE — TOC CM/SW Note (Signed)
Patient called 2c unit today stating she has not received her walker or 3 in 1. Patient stated someone from [employer] called her and told her both pieces of DME will be delivered to her home.   NCM called Mitch with [employer] per their records they spoke to patient and asked if both pieces of DME could be dropped shipped to her home , which takes 3 to 5 business days. [employer] will call patient directly today to discuss.

## 2022-10-05 ENCOUNTER — Ambulatory Visit (INDEPENDENT_AMBULATORY_CARE_PROVIDER_SITE_OTHER): Payer: Medicare HMO | Admitting: Infectious Diseases

## 2022-10-05 ENCOUNTER — Other Ambulatory Visit: Payer: Self-pay

## 2022-10-05 ENCOUNTER — Encounter: Payer: Self-pay | Admitting: Infectious Diseases

## 2022-10-05 VITALS — BP 119/82 | HR 92 | Temp 97.2°F | Ht 64.0 in | Wt 273.0 lb

## 2022-10-05 DIAGNOSIS — Z79899 Other long term (current) drug therapy: Secondary | ICD-10-CM | POA: Insufficient documentation

## 2022-10-05 DIAGNOSIS — R9389 Abnormal findings on diagnostic imaging of other specified body structures: Secondary | ICD-10-CM

## 2022-10-05 NOTE — Progress Notes (Unsigned)
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) 09/11/2022   Coronary artery disease involving native coronary artery of native heart without angina pectoris 09/11/2022   Acute on chronic heart failure with preserved ejection fraction (HFpEF) (HCC) 09/11/2022   OSA (obstructive sleep apnea) 09/11/2022   Obesity, Class III, BMI 40-49.9 (morbid obesity) (HCC) 09/06/2022   Paroxysmal atrial fibrillation with RVR (HCC) 09/06/2022   Influenza A (H1N1) 02/03/2012   COPD (chronic obstructive pulmonary disease) (HCC) 02/02/2012   Diabetes mellitus (HCC) 02/01/2012   Acute on chronic respiratory failure with hypercapnia (HCC) 02/01/2012   SIRS (systemic inflammatory response syndrome) (HCC) 02/01/2012   Hypertension 02/01/2012   Depression 02/01/2012   Obesity 02/01/2012    Patient's Medications  New Prescriptions   No medications on file  Previous Medications   ALBUTEROL (VENTOLIN HFA) 108 (90 BASE) MCG/ACT INHALER    Inhale 1-2 puffs into the lungs as needed for wheezing or shortness of breath.   AMIODARONE (PACERONE) 200 MG TABLET    Take 1 tablet (200 mg total) by mouth 2 (two) times daily.   APIXABAN (ELIQUIS) 5 MG TABS TABLET    Take 1 tablet (5 mg total) by mouth 2 (two) times daily.   ATORVASTATIN (LIPITOR) 20 MG TABLET    Take 20 mg by mouth daily.   CALCIUM-PHOSPHORUS-VITAMIN D (CALCIUM/VITAMIN D3/ADULT GUMMY PO)    GUMMY VITAMIN D 2000   CHOLECALCIFEROL (VITAMIN D) 1000 UNITS TABLET    Take 2,000 Units by mouth daily. Gummies   FUROSEMIDE (LASIX) 80 MG TABLET    Take 1 tablet (80 mg total) by mouth 2 (two) times daily.   GABAPENTIN (NEURONTIN) 300 MG CAPSULE    Take 300 mg by mouth at bedtime.   JANUVIA 25 MG TABLET    Take 25 mg by mouth daily.   METFORMIN (GLUCOPHAGE) 1000 MG TABLET    Take 1,000 mg by mouth 2 (two)  times daily with a meal.   METOPROLOL SUCCINATE (TOPROL-XL) 100 MG 24 HR TABLET    Take 1 tablet (100 mg total) by mouth daily. Take with or immediately following a meal.   POTASSIUM CHLORIDE (KLOR-CON M) 10 MEQ TABLET    Take 2 tablets (20 mEq total) by mouth daily for 14 days.   SPIRIVA HANDIHALER 18 MCG INHALATION CAPSULE    Place 18 mcg into inhaler and inhale daily.  Modified Medications   No medications on file  Discontinued Medications   No medications on file    Subjective: 67 year old female with prior history of HTN, DM, cerebral aneurysm, PAF on AC, depression and morbid obesity with recent hospitalization who is here for HFU for abnormal CT T spine.  Patient was discharged on 9/11 without any antibiotics as 9/8 disc aspirate cultures were without growth as well and CT L-spine was favored to be degenerative.   10/05/22 Reports her back pain has significantly improved since the time of admission from 10 out of 10 to 3-4 out of 10.  She reports taking Tylenol as needed.  Denies any fever, chills or sweats.  Denies nausea, vomiting or diarrhea morning.  Discussed about her to follow-up with her surgeon who placed aneurysm clips to see if she can get an MRI if needed in the future.  She has no other complaints today.   Review of Systems: All systems  reviewed with pertinent positive and negative as listed above  Past Medical History:  Diagnosis Date   Arthritis    Cerebral aneurysm    x2   Depression    Diabetes mellitus    Hypertension    Past Surgical History:  Procedure Laterality Date   BRAIN SURGERY     double aneurysm   IR THORACIC DISC ASPIRATION W/IMG GUIDE  09/14/2022   IR THORACIC DISC ASPIRATION W/IMG GUIDE  09/16/2022   RADIOLOGY WITH ANESTHESIA N/A 09/16/2022   Procedure: IR thoracic disc aspiration;  Surgeon: Baldemar Lenis, MD;  Location: [facility] OR;  Service: Radiology;  Laterality: N/A;    Social History   Tobacco Use   Smoking status: Some Days     Types: Cigarettes   Smokeless tobacco: Never  Vaping Use   Vaping status: Never Used  Substance Use Topics   Alcohol use: No   Drug use: No    Family History  Family history unknown: Yes    Allergies  Allergen Reactions   Hydrocodone Nausea And Vomiting    Health Maintenance  Topic Date Due   Medicare Annual Wellness (AWV)  Never done   Pneumonia Vaccine 4+ Years old (1 of 2 - PCV) Never done   OPHTHALMOLOGY EXAM  Never done   Diabetic kidney evaluation - Urine ACR  Never done   Hepatitis C Screening  Never done   DTaP/Tdap/Td (1 - Tdap) Never done   Colonoscopy  Never done   Zoster Vaccines- Shingrix (1 of 2) Never done   DEXA SCAN  Never done   FOOT EXAM  05/20/2022   INFLUENZA VACCINE  Never done   MAMMOGRAM  09/02/2022   COVID-19 Vaccine (1 - 2023-24 season) Never done   HEMOGLOBIN A1C  03/10/2023   Diabetic kidney evaluation - eGFR measurement  09/22/2023   HPV VACCINES  Aged Out    Objective: Ht 5\' 4"  (1.626 m)   Wt 273 lb (123.8 kg)   BMI 46.86 kg/m    Physical Exam Constitutional:      Appearance: Normal appearance. Morbidly obese  HENT:     Head: Normocephalic and atraumatic.      Mouth: Mucous membranes are moist.  Eyes:    Conjunctiva/sclera: Conjunctivae normal.     Pupils: Pupils are equal, round, and bilaterally symmetrical   Cardiovascular:     Rate and Rhythm: Normal rate and Irregular rhythm.     Heart sounds: s1s2  Pulmonary:     Effort: Pulmonary effort is normal.     Breath sounds: Normal breath sounds.   Abdominal:     General: Non distended     Palpations: soft.   Musculoskeletal:        General: Normal range of motion.   Skin:    General: Skin is warm and dry.     Comments:  Neurological:     General: grossly non focal     Mental Status: awake, alert and oriented to person, place, and time.   Psychiatric:        Mood and Affect: Mood normal.   Lab Results Lab Results  Component Value Date   WBC 9.6 09/22/2022    HGB 10.6 (L) 09/22/2022   HCT 36.2 09/22/2022   MCV 84.4 09/22/2022   PLT 325 09/22/2022    Lab Results  Component Value Date   CREATININE 1.15 (H) 09/22/2022   BUN 28 (H) 09/22/2022   NA 135 09/22/2022   K 4.7 09/22/2022  CL 99 09/22/2022   CO2 27 09/22/2022    Lab Results  Component Value Date   ALT 15 09/14/2022   AST 19 09/14/2022   ALKPHOS 54 09/14/2022   BILITOT 0.5 09/14/2022    Lab Results  Component Value Date   CHOL 89 09/11/2022   HDL 42 09/11/2022   LDLCALC 32 09/11/2022   TRIG 75 09/11/2022   CHOLHDL 2.1 09/11/2022   No results found for: "LABRPR", "RPRTITER" No results found for: "HIV1RNAQUANT", "HIV1RNAVL", "CD4TABS"   Imaging  CT THORACIC SPINE W CONTRAST  Result Date: 09/21/2022 CLINICAL DATA:  Possible discitis-osteomyelitis EXAM: CT THORACIC SPINE WITH CONTRAST TECHNIQUE: Multidetector CT images of thoracic was performed according to the standard protocol following intravenous contrast administration. RADIATION DOSE REDUCTION: This exam was performed according to the departmental dose-optimization program which includes automated exposure control, adjustment of the mA and/or kV according to patient size and/or use of iterative reconstruction technique. CONTRAST:  75mL OMNIPAQUE IOHEXOL 350 MG/ML SOLN COMPARISON:  Chest CT 09/08/2022 FINDINGS: Alignment: Normal Vertebrae: Unchanged endplate remodeling of the opposing T8-9 surfaces. This is favored to be degenerative. No acute osseous abnormality. Paraspinal and other soft tissues: Cardiomegaly Disc levels: No high-grade spinal canal stenosis. IMPRESSION: 1. Unchanged T8-9 endplate remodeling, favored to be degenerative. 2. No acute osseous abnormality. Electronically Signed   By: Deatra Robinson M.D.   On: 09/21/2022 01:37   IR THORACIC DISC ASPIRATION W/IMG GUIDE  Result Date: 09/17/2022 INDICATION: Royalti Dewaters is a 67 y.o. female with PMH significant arthritis, atrial fibrillation, cerebral aneurysm  x2, depression, diabetes mellitus, and hypertension being seen today in relation to suspected discitis/osteomyelitis. Patient was admitted to [facility] on 09/06/22 after being found to be in acute respiratory failure. CT on 09/06/22 noted findings of pulmonary nodules, which prompted follow-up CT Chest on 8/28 which revealed concern for discitis/osteomyelitis at T8-T9. MRI is unable to be obtained due to aneurysm clips of unknown MR compatibility. They were consulted for image-guided T8-T9 disc aspiration. Attempt to perform the procedure on September 14, 2022. However, patient could not tolerate lying on the procedure table. She returns today for disc aspiration under monitored anesthesia care. EXAM: FLUOROSCOPY GUIDED T8-9 FINE-NEEDLE DISC ASPIRATION MEDICATIONS: No antibiotics administered. ANESTHESIA/SEDATION: The procedure was performed under monitored anesthesia care (MAC). COMPLICATIONS: None immediate. FLUOROSCOPY: DAP 19741.04 mGym2 PROCEDURE: Informed written consent was obtained from the patient after a thorough discussion of the procedural risks, benefits and alternatives. All questions were addressed. Maximal Sterile Barrier Technique was utilized including caps, mask, sterile gowns, sterile gloves, sterile drape, hand hygiene and skin antiseptic. A timeout was performed prior to the initiation of the procedure. The patient was placed in prone position on the angiography table. The thoracic spine region was prepped and draped in a sterile fashion. Under fluoroscopy, the T8-9 disc space was delineated and the skin area was marked. The skin was infiltrated with a 1% Lidocaine approximately 5 cm lateral to the spinous process projection on the left. Using a 22-gauge spinal needle, the soft issue and the peripedicular space were infiltrated with Bupivacaine 0.5%. Subsequently, a 20 gauge spinal needle was advanced under fluoroscopy guided into the T8-9 disc space. The mandrel was removed and 2 fine-needle aspiration  samples were obtained. Small amount of blood tinged fluid was collected in each sample. The needle was subsequently withdrawn. The access sites were cleaned and covered with a sterile bandage. IMPRESSION: Successful fluoroscopy guided T8-9 intervertebral disc fine-needle aspiration. Electronically Signed   By: Seymour Bars  Melchor Amour M.D.   On: 09/17/2022 10:49   IR THORACIC DISC ASPIRATION W/IMG GUIDE  Result Date: 09/15/2022 INDICATION: Kmarie Paskett is a 67 y.o. female with PMH significant arthritis, atrial fibrillation, cerebral aneurysm x2, depression, diabetes mellitus, and hypertension being seen today in relation to suspected discitis/osteomyelitis. Patient was admitted to [facility] on 09/06/22 after being found to be in acute respiratory failure. CT on 09/06/22 noted findings of pulmonary nodules, which prompted follow-up CT Chest on 8/28 which revealed concern for discitis/osteomyelitis at T8-T9. MRI is unable to be obtained due to aneurysm clips of unknown MR compatibility. IR was subsequently consulted for image-guided T8-T9 disc aspiration. EXAM: FLUOROSCOPY GUIDED T8-T9 DISC ASPIRATION MEDICATIONS: None. ANESTHESIA/SEDATION: None. COMPLICATIONS: None immediate. FLUOROSCOPY: No radiation exposure performed. PROCEDURE: Informed written consent was obtained from the patient after a thorough discussion of the procedural risks, benefits and alternatives. All questions were addressed. Patient was transferred to the angiography table. However, she was unable to tolerate lying on her stomach for the intervention. Procedure was, therefore, aborted. IMPRESSION: Patient unable to tolerate positioning in the procedural table. Procedure was aborted. Electronically Signed   By: Baldemar Lenis M.D.   On: 09/15/2022 14:09   CT HEAD WO CONTRAST ( )  Result Date: 09/10/2022 CLINICAL DATA:  CT for MRI clearance. EXAM: CT HEAD WITHOUT CONTRAST TECHNIQUE: Contiguous axial images were obtained  from the base of the skull through the vertex without intravenous contrast. RADIATION DOSE REDUCTION: This exam was performed according to the departmental dose-optimization program which includes automated exposure control, adjustment of the mA and/or kV according to patient size and/or use of iterative reconstruction technique. COMPARISON:  None Available. FINDINGS: Brain: Cystic encephalomalacia in the right parieto-occipital region. Encephalomalacia in the anterior right temporal lobe. No evidence of acute large vascular territory infarct, acute hemorrhage, mass lesion, midline shift or hydrocephalus. Vascular: Aneurysm clips in the right paraclinoid region. Skull: Right-sided craniotomy.  No evidence of acute fracture. Sinuses/Orbits: No sub clear sinuses.  No acute orbital findings. Other: No mastoid effusions. IMPRESSION: 1. Positive for aneurysm clips. MRI is likely contraindicated, but recommend correlation with prior surgical note and exact type of clip if available. 2. No evidence of acute intracranial abnormality. 3. Encephalomalacia in the right parieto-occipital region and anterior right temporal lobe. Electronically Signed   By: Feliberto Harts M.D.   On: 09/10/2022 17:34   DG CHEST PORT 1 VIEW  Result Date: 09/09/2022 CLINICAL DATA:  CHF EXAM: PORTABLE CHEST 1 VIEW COMPARISON:  CXR 09/06/22 FINDINGS: Cardiomegaly. No pleural effusion. No pneumothorax. No focal airspace opacity. No radiographically apparent displaced rib fractures. Visualized upper abdomen unremarkable. Mild pulmonary venous congestion IMPRESSION: Cardiomegaly with mild pulmonary venous congestion. No evidence of pleural effusion or overt pulmonary edema. Electronically Signed   By: Lorenza Cambridge M.D.   On: 09/09/2022 13:34   CT CHEST W CONTRAST  Result Date: 09/08/2022 CLINICAL DATA:  Pulmonary nodules EXAM: CT CHEST WITH CONTRAST TECHNIQUE: Multidetector CT imaging of the chest was performed during intravenous contrast  administration. RADIATION DOSE REDUCTION: This exam was performed according to the departmental dose-optimization program which includes automated exposure control, adjustment of the mA and/or kV according to patient size and/or use of iterative reconstruction technique. CONTRAST:  75mL OMNIPAQUE IOHEXOL 350 MG/ML SOLN COMPARISON:  Chest radiograph dated 09/06/2018. CT chest dated 02/01/2012. FINDINGS: Cardiovascular: The heart is top-normal in size. No pericardial effusion. No evidence of thoracic aneurysm. Atherosclerotic calcifications of the aortic arch. Moderate coronary atherosclerosis of the LAD  and left coronary artery. Mediastinum/Nodes: Small mediastinal nodes, including a 17 mm short axis low right paratracheal node (series 3/image 84) and 14 mm short axis right hilar/perihilar nodes. Visualized thyroid is unremarkable. Lungs/Pleura: Evaluation of the lung parenchyma is constrained by respiratory motion. Within that constraint, there are no suspicious pulmonary nodules. Specifically, the right basilar nodular opacity noted on recent prior CT is not evident on the current study. Mild patchy right lower lobe opacity, favoring atelectasis, with suspected trace right pleural effusion. New 10 mm opacity at the medial left lung base likely reflects additional atelectasis rather than a true nodule given rapid interval change. No pneumothorax. Upper Abdomen: Visualized upper abdomen is grossly unremarkable, noting vascular calcifications. Musculoskeletal: Degenerative changes of the visualized thoracolumbar spine. Destructive endplate changes at T8-9 with suspected paravertebral soft tissue (series 3/image 83), raising the possibility of discitis osteomyelitis. IMPRESSION: Destructive endplate changes at T8-9 with suspected paravertebral soft tissue, raising the possibility of discitis osteomyelitis. MRI of the thoracic spine with and without contrast is suggested for further evaluation. No suspicious pulmonary  nodules. Specifically, the right basilar nodular opacity noted on recent prior CT is not evident on the current study. Mild patchy right lower lobe opacity, favoring atelectasis, with suspected trace right pleural effusion. Small mediastinal and right hilar/perihilar nodes, possibly reactive. Follow-up CT chest is suggested in 3 months. Aortic Atherosclerosis (ICD10-I70.0). Electronically Signed   By: Charline Bills M.D.   On: 09/08/2022 16:48   ECHOCARDIOGRAM COMPLETE  Result Date: 09/07/2022    ECHOCARDIOGRAM REPORT   Patient Name:   Linda Chase Date of Exam: 09/07/2022 Medical Rec #:  [id number]           Height:       64.0 in Accession #:    [id number]          Weight:       300.2 lb Date of Birth:  1955-10-07          BSA:          2.325 m Patient Age:    66 years            BP:           111/78 mmHg Patient Gender: F                   HR:           101 bpm. Exam Location:  Inpatient Procedure: 2D Echo, 3D Echo, Cardiac Doppler and Color Doppler Indications:    I48.91* Unspeicified atrial fibrillation  History:        Patient has prior history of Echocardiogram examinations, most                 recent 02/02/2012. Abnormal ECG, COPD, Arrythmias:Atrial                 Fibrillation; Risk Factors:Diabetes and Hypertension.  Sonographer:    Sheralyn Boatman RDCS Referring Phys: Clydie Braun  Sonographer Comments: Patient is obese. Image acquisition challenging due to patient body habitus. Study delayed 20 minutes to get patient back in bed IMPRESSIONS  1. Left ventricular ejection fraction, by estimation, is 50%. Left ventricular ejection fraction by 3D volume is 49 %. The left ventricle has mildly decreased function. The left ventricle demonstrates global hypokinesis. There is mild left ventricular hypertrophy. Left ventricular diastolic parameters are indeterminate. There is the interventricular septum is flattened in systole, consistent with right ventricular pressure overload.  2. Right ventricular  systolic  function is mildly reduced. The right ventricular size is mildly enlarged. Mildly increased right ventricular wall thickness. There is mildly elevated pulmonary artery systolic pressure. The estimated right ventricular systolic pressure is 44.8 mmHg.  3. Left atrial size was mildly dilated.  4. Right atrial size was mildly dilated.  5. The mitral valve is grossly normal. Trivial mitral valve regurgitation. No evidence of mitral stenosis. Moderate mitral annular calcification.  6. There is one beat of systolic flow reversal of hepatic vein spectral Doppler. Tricuspid valve regurgitation is moderate to severe.  7. Nodular calcification of leaflet tips. The aortic valve is tricuspid. There is mild calcification of the aortic valve. Aortic valve regurgitation is trivial. No aortic stenosis is present.  8. Aortic dilatation noted. There is mild dilatation of the ascending aorta, measuring 42 mm.  9. The inferior vena cava is dilated in size with <50% respiratory variability, suggesting right atrial pressure of 15 mmHg. Comparison(s): Unable to view prior study. FINDINGS  Left Ventricle: Left ventricular ejection fraction, by estimation, is 50%. Left ventricular ejection fraction by 3D volume is 49 %. The left ventricle has mildly decreased function. The left ventricle demonstrates global hypokinesis. The left ventricular internal cavity size was normal in size. There is mild left ventricular hypertrophy. The interventricular septum is flattened in systole, consistent with right ventricular pressure overload. Left ventricular diastolic parameters are indeterminate. Right Ventricle: The right ventricular size is mildly enlarged. Mildly increased right ventricular wall thickness. Right ventricular systolic function is mildly reduced. There is mildly elevated pulmonary artery systolic pressure. The tricuspid regurgitant velocity is 2.73 m/s, and with an assumed right atrial pressure of 15 mmHg, the estimated right  ventricular systolic pressure is 44.8 mmHg. Left Atrium: Left atrial size was mildly dilated. Right Atrium: Right atrial size was mildly dilated. Pericardium: There is no evidence of pericardial effusion. Mitral Valve: The mitral valve is grossly normal. Moderate mitral annular calcification. Trivial mitral valve regurgitation. No evidence of mitral valve stenosis. Tricuspid Valve: There is one beat of systolic flow reversal of hepatic vein spectral Doppler. The tricuspid valve is grossly normal. Tricuspid valve regurgitation is moderate to severe. No evidence of tricuspid stenosis. Aortic Valve: Nodular calcification of leaflet tips. The aortic valve is tricuspid. There is mild calcification of the aortic valve. Aortic valve regurgitation is trivial. No aortic stenosis is present. Pulmonic Valve: The pulmonic valve was normal in structure. Pulmonic valve regurgitation is not visualized. No evidence of pulmonic stenosis. Aorta: Aortic dilatation noted. There is mild dilatation of the ascending aorta, measuring 42 mm. Venous: The inferior vena cava is dilated in size with less than 50% respiratory variability, suggesting right atrial pressure of 15 mmHg. IAS/Shunts: No atrial level shunt detected by color flow Doppler.  LEFT VENTRICLE PLAX 2D LVIDd:         4.60 cm LVIDs:         3.50 cm LV PW:         1.60 cm         3D Volume EF LV IVS:        1.20 cm         LV 3D EF:    Left                                             ventricul  ar LV Volumes (MOD)                            ejection LV vol d, MOD    69.4 ml                    fraction A2C:                                        by 3D LV vol d, MOD    64.8 ml                    volume is A4C:                                        49 %. LV vol s, MOD    28.0 ml A2C: LV vol s, MOD    29.9 ml       3D Volume EF: A4C:                           3D EF:        49 % LV SV MOD A2C:   41.4 ml       LV EDV:       170 ml LV SV MOD  A4C:   64.8 ml       LV ESV:       87 ml LV SV MOD BP:    39.9 ml       LV SV:        83 ml RIGHT VENTRICLE             IVC RV S prime:     13.30 cm/s  IVC diam: 2.70 cm RVOT diam:      2.90 cm TAPSE (M-mode): 1.9 cm LEFT ATRIUM             Index        RIGHT ATRIUM           Index LA diam:        4.90 cm 2.11 cm/m   RA Area:     24.80 cm LA Vol (A2C):   64.5 ml 27.74 ml/m  RA Volume:   78.20 ml  33.64 ml/m LA Vol (A4C):   68.1 ml 29.29 ml/m LA Biplane Vol: 67.9 ml 29.20 ml/m  AORTIC VALVE LVOT Vmax:   136.50 cm/s LVOT Vmean:  93.500 cm/s LVOT VTI:    0.210 m  AORTA Ao Root diam: 3.10 cm Ao Asc diam:  4.15 cm MITRAL VALVE                TRICUSPID VALVE MV Area (PHT): 3.99 cm     TR Peak grad:   29.8 mmHg MV Decel Time: 190 msec     TR Vmax:        273.00 cm/s MV E velocity: 134.00 cm/s                             SHUNTS  Systemic VTI:  0.21 m                             Pulmonic Diam: 2.90 cm Riley Lam MD Electronically signed by Riley Lam MD Signature Date/Time: 09/07/2022/1:29:37 PM    Final     Assessment/Plan  67 year old female with prior history of HTN, DM, cerebral aneurysm, PAF on AC, depression and morbid obesity with   # CT with Destructive endplate changes at T8-9 with suspected paravertebral soft tissue, raising the possibility of discitis osteomyelitis 8/29 blood cx NGTD 9/8 disc aspirate cx NG, AFB smear negative, Cx pending and fungal cx NGTD 9/10 CT L spine favored to be degenerative  ESR 57, CRP 4.3> 6.5  Plan  Very low suspicion for discitis and osteomyelitis with all the above negative work up and esp back pain has significantly improved without any abtx. I have however advised to fu with her surgeon regarding aneurysm clips? If she is MRI compatible if ever needed in the future.   Will get CRP, ESR and CBC to trend but do not think it will make any changes in terms of abtx Fu in a month   I have personally spent 40 minutes  involved in face-to-face and non-face-to-face activities for this patient on the day of the visit. Professional time spent includes the following activities: Preparing to see the patient (review of tests), Obtaining and/or reviewing separately obtained history (admission/discharge record), Performing a medically appropriate examination and/or evaluation , Ordering medications/tests/procedures, referring and communicating with other health care professionals, Documenting clinical information in the EMR, Independently interpreting results (not separately reported), Communicating results to the patient/family/caregiver, Counseling and educating the patient/family/caregiver and Care coordination (not separately reported).   Victoriano Lain, MD [facility] for Infectious Disease [facility] [facility] 10/05/2022, 3:43 PM

## 2022-10-06 ENCOUNTER — Telehealth (HOSPITAL_COMMUNITY): Payer: Self-pay

## 2022-10-06 ENCOUNTER — Ambulatory Visit (HOSPITAL_COMMUNITY)
Admit: 2022-10-06 | Discharge: 2022-10-06 | Disposition: A | Discharge status: Home / Self Care | Payer: Medicare HMO | Attending: Physician Assistant | Admitting: Physician Assistant

## 2022-10-06 ENCOUNTER — Encounter (HOSPITAL_COMMUNITY): Payer: Self-pay

## 2022-10-06 VITALS — BP 118/100 | HR 95 | Wt 274.6 lb

## 2022-10-06 DIAGNOSIS — I361 Nonrheumatic tricuspid (valve) insufficiency: Secondary | ICD-10-CM | POA: Diagnosis not present

## 2022-10-06 DIAGNOSIS — I4819 Other persistent atrial fibrillation: Secondary | ICD-10-CM | POA: Diagnosis not present

## 2022-10-06 DIAGNOSIS — I5022 Chronic systolic (congestive) heart failure: Secondary | ICD-10-CM | POA: Diagnosis not present

## 2022-10-06 DIAGNOSIS — I480 Paroxysmal atrial fibrillation: Secondary | ICD-10-CM | POA: Diagnosis not present

## 2022-10-06 DIAGNOSIS — F1010 Alcohol abuse, uncomplicated: Secondary | ICD-10-CM | POA: Insufficient documentation

## 2022-10-06 DIAGNOSIS — I5020 Unspecified systolic (congestive) heart failure: Secondary | ICD-10-CM

## 2022-10-06 DIAGNOSIS — I110 Hypertensive heart disease with heart failure: Secondary | ICD-10-CM | POA: Diagnosis present

## 2022-10-06 DIAGNOSIS — E119 Type 2 diabetes mellitus without complications: Secondary | ICD-10-CM | POA: Insufficient documentation

## 2022-10-06 DIAGNOSIS — Z79899 Other long term (current) drug therapy: Secondary | ICD-10-CM | POA: Diagnosis not present

## 2022-10-06 DIAGNOSIS — R9389 Abnormal findings on diagnostic imaging of other specified body structures: Secondary | ICD-10-CM | POA: Insufficient documentation

## 2022-10-06 DIAGNOSIS — Z760 Encounter for issue of repeat prescription: Secondary | ICD-10-CM | POA: Insufficient documentation

## 2022-10-06 DIAGNOSIS — I4892 Unspecified atrial flutter: Secondary | ICD-10-CM | POA: Insufficient documentation

## 2022-10-06 DIAGNOSIS — I071 Rheumatic tricuspid insufficiency: Secondary | ICD-10-CM | POA: Diagnosis not present

## 2022-10-06 DIAGNOSIS — G4733 Obstructive sleep apnea (adult) (pediatric): Secondary | ICD-10-CM | POA: Diagnosis not present

## 2022-10-06 LAB — BASIC METABOLIC PANEL
Anion gap: 8 (ref 5–15)
BUN: 29 mg/dL — ABNORMAL HIGH (ref 8–23)
CO2: 31 mmol/L (ref 22–32)
Calcium: 9.1 mg/dL (ref 8.9–10.3)
Chloride: 99 mmol/L (ref 98–111)
Creatinine, Ser: 1.35 mg/dL — ABNORMAL HIGH (ref 0.44–1.00)
GFR, Estimated: 43 mL/min — ABNORMAL LOW (ref >60)
Glucose, Bld: 112 mg/dL — ABNORMAL HIGH (ref 70–99)
Potassium: 3.9 mmol/L (ref 3.5–5.1)
Sodium: 138 mmol/L (ref 135–145)

## 2022-10-06 LAB — CBC
HCT: 38.4 % (ref 35.0–45.0)
Hemoglobin: 11.7 g/dL (ref 11.7–15.5)
MCH: 24.7 pg — ABNORMAL LOW (ref 27.0–33.0)
MCHC: 30.5 g/dL — ABNORMAL LOW (ref 32.0–36.0)
MCV: 81.2 fL (ref 80.0–100.0)
MPV: 12.3 fL (ref 7.5–12.5)
Platelets: 255 10*3/uL (ref 140–400)
RBC: 4.73 10*6/uL (ref 3.80–5.10)
RDW: 14.5 % (ref 11.0–15.0)
WBC: 8 10*3/uL (ref 3.8–10.8)

## 2022-10-06 LAB — C-REACTIVE PROTEIN: CRP: 9.9 mg/L — ABNORMAL HIGH (ref <8.0)

## 2022-10-06 LAB — BRAIN NATRIURETIC PEPTIDE: B Natriuretic Peptide: 152.3 pg/mL — ABNORMAL HIGH (ref 0.0–100.0)

## 2022-10-06 LAB — SEDIMENTATION RATE: Sed Rate: 55 mm/h — ABNORMAL HIGH (ref 0–30)

## 2022-10-06 MED ORDER — METOPROLOL SUCCINATE ER 100 MG PO TB24: 100.0000 mg | ORAL_TABLET | Freq: Every day | ORAL | 0 refills | Status: DC

## 2022-10-06 MED ORDER — LOSARTAN POTASSIUM 25 MG PO TABS: 25.0000 mg | ORAL_TABLET | Freq: Every day | ORAL | 3 refills | Status: DC

## 2022-10-06 MED ORDER — ATORVASTATIN CALCIUM 20 MG PO TABS: 20.0000 mg | ORAL_TABLET | Freq: Every day | ORAL | 3 refills | Status: DC

## 2022-10-06 MED ORDER — FUROSEMIDE 80 MG PO TABS: 80.0000 mg | ORAL_TABLET | Freq: Two times a day (BID) | ORAL | 3 refills | Status: DC

## 2022-10-06 MED ORDER — POTASSIUM CHLORIDE CRYS ER 10 MEQ PO TBCR: 20.0000 meq | EXTENDED_RELEASE_TABLET | Freq: Every day | ORAL | 1 refills | Status: DC

## 2022-10-06 MED ORDER — AMIODARONE HCL 200 MG PO TABS: 200.0000 mg | ORAL_TABLET | Freq: Two times a day (BID) | ORAL | 1 refills | Status: DC

## 2022-10-06 MED ORDER — METOPROLOL SUCCINATE ER 100 MG PO TB24: 100.0000 mg | ORAL_TABLET | Freq: Every day | ORAL | 1 refills | Status: DC

## 2022-10-06 MED ORDER — APIXABAN 5 MG PO TABS: 5.0000 mg | ORAL_TABLET | Freq: Two times a day (BID) | ORAL | 1 refills | Status: AC

## 2022-10-06 NOTE — TOC CM/SW Note (Addendum)
Received secure chat from SW in Heart Failure Clinic. Patient there reporting she never received her walker or bedside commode.   NCM called Mitch with [employer]. Per their records she received both via [employer] on 09/24/22. He will submit a ticket to his office reporting she never got the bedside commode.  [employer]'s office number is [phone].  NCM secured chatted above information to [employer] , SW in Heart Failure Clinic  Mitch called me back . They have a picture where [employer] dropped both pieces of equipment off

## 2022-10-06 NOTE — Telephone Encounter (Signed)
-----   Message from [facility], [place] N sent at 10/06/2022  3:46 PM EDT ----- Labs stable. Repeat BMET in 2 weeks since we started Losartan today

## 2022-10-06 NOTE — Addendum Note (Signed)
Encounter addended by: Marcy Siren, LCSW on: 10/06/2022 3:27 PM  Actions taken: Clinical Note Signed

## 2022-10-06 NOTE — Telephone Encounter (Addendum)
Patient aware of lab work and appointment made for lab work for 2 weeks  ----- Message from [facility], [place] sent at 10/06/2022  3:46 PM EDT ----- Labs stable. Repeat BMET in 2 weeks since we started Losartan today

## 2022-10-06 NOTE — Progress Notes (Addendum)
HEART & VASCULAR TRANSITION OF CARE CONSULT NOTE     Referring Physician: Dr. Maryfrances Bunnell Primary Care: Establishing with Dr. Mikeal Hawthorne Primary Cardiologist: Dr. Tenny Craw  HPI: Referred to clinic by DR. Danford with [facility] for heart failure consultation. 67 y.o. female with history of PAF, morbid obesity, OSA not on CPAP( reports prior sleep study), DM II, HTN, cerebral aneurysm s/p craniotomy and clipping, prior tobacco use quit in 2023, COPD.   Admitted in [place] in 04/24 with atrial fibrillation with RVR (uncertain if new). Spontaneously converted to SR.  Recently moved to the area from [place] in August 2024.  Presented to ED for evaluation of back pain in 08/24. Admitted with acute respiratory failure with hypoxia and hypercapnia in setting of untreated OSA/OHS/acute CHF and Afib with RVR. Required BiPAP. Cardiology consulted.   Echo with EF 50-%, mildly reduced RV, evidence of RV pressure overload w/ flattened interventricular septum in systole, RVSP 44 mmHg, moderate to severe TR.   Initially treated with IV diltiazem but later switched to IV amiodarone. Amiodarone switched to PO for loading. In atrial fibrillation at discharge. She was diuresed with IV lasix, 327>>285 lb.   Her hospital course was further c/b concern for possible degenerative changes vs discitis/osteomyelitis in T8-T9 spine. She was seen by ID. Initially on IV abx. Unable to get MRI d/t prior cerebral aneurysm coiling. Disc aspirate with no growth. Antibiotics discontinued.  She is here today for hospital follow-up. Reports marked improvement in lower extremity edema. Clinic weight 274 lb. No dyspnea reported but she is not very mobile. Arrived in a wheelchair. Unable to stand or sit for long periods d/t back pain. She is taking all medications as prescribed. Her son lives with her. Does some cooking and chores as tolerated. Drinks sugar free juice and diet soda. Not using a salt shaker. Eats fried food "occasionally", difficult to  illicit further details.  Has a medicaid application pending.   Past Medical History:  Diagnosis Date   Arthritis    Cerebral aneurysm    x2   Depression    Diabetes mellitus    Hypertension     Current Outpatient Medications  Medication Sig Dispense Refill   albuterol (VENTOLIN HFA) 108 (90 Base) MCG/ACT inhaler Inhale 1-2 puffs into the lungs as needed for wheezing or shortness of breath.     amiodarone (PACERONE) 200 MG tablet Take 1 tablet (200 mg total) by mouth 2 (two) times daily. 60 tablet 0   Calcium-Phosphorus-Vitamin D (CALCIUM/VITAMIN D3/ADULT GUMMY PO) GUMMY VITAMIN D 2000     cholecalciferol (VITAMIN D) 1000 units tablet Take 2,000 Units by mouth daily. Gummies     gabapentin (NEURONTIN) 300 MG capsule Take 300 mg by mouth at bedtime.     JANUVIA 25 MG tablet Take 25 mg by mouth daily.     losartan (COZAAR) 25 MG tablet Take 1 tablet (25 mg total) by mouth daily. 90 tablet 3   metFORMIN (GLUCOPHAGE) 1000 MG tablet Take 1,000 mg by mouth 2 (two) times daily with a meal.     potassium chloride (KLOR-CON M) 10 MEQ tablet Take 2 tablets (20 mEq total) by mouth daily for 14 days. 28 tablet 0   apixaban (ELIQUIS) 5 MG TABS tablet Take 1 tablet (5 mg total) by mouth 2 (two) times daily. 60 tablet 1   atorvastatin (LIPITOR) 20 MG tablet Take 1 tablet (20 mg total) by mouth daily. 90 tablet 3   furosemide (LASIX) 80 MG tablet Take 1 tablet (  80 mg total) by mouth 2 (two) times daily. 60 tablet 3   metoprolol succinate (TOPROL-XL) 100 MG 24 hr tablet Take 1 tablet (100 mg total) by mouth daily. Take with or immediately following a meal. 30 tablet 0   SPIRIVA HANDIHALER 18 MCG inhalation capsule Place 18 mcg into inhaler and inhale daily. (Patient not taking: Reported on 10/06/2022)     No current facility-administered medications for this encounter.    Allergies  Allergen Reactions   Hydrocodone Nausea And Vomiting      Social History   Socioeconomic History   Marital  status: Legally Separated    Spouse name: Not on file   Number of children: Not on file   Years of education: Not on file   Highest education level: 11th grade  Occupational History   Occupation: disabled  Tobacco Use   Smoking status: Some Days    Types: Cigarettes   Smokeless tobacco: Never  Vaping Use   Vaping status: Never Used  Substance and Sexual Activity   Alcohol use: No   Drug use: No   Sexual activity: Never  Other Topics Concern   Not on file  Social History Narrative   Not on file   Social Determinants of Health   Financial Resource Strain: Low Risk  (09/22/2022)   Overall Financial Resource Strain (CARDIA)    Difficulty of Paying Living Expenses: Not hard at all  Food Insecurity: No Food Insecurity (09/09/2022)   Hunger Vital Sign    Worried About Running Out of Food in the Last Year: Never true    Ran Out of Food in the Last Year: Never true  Transportation Needs: No Transportation Needs (09/22/2022)   PRAPARE - Administrator, Civil Service (Medical): No    Lack of Transportation (Non-Medical): No  Physical Activity: Not on file  Stress: Not on file  Social Connections: Not on file  Intimate Partner Violence: Not At Risk (09/09/2022)   Humiliation, Afraid, Rape, and Kick questionnaire    Fear of Current or Ex-Partner: No    Emotionally Abused: No    Physically Abused: No    Sexually Abused: No      Family History  Family history unknown: Yes    Vitals:   10/06/22 1151  BP: (!) 118/100  Pulse: 95  SpO2: 93%  Weight: 124.6 kg (274 lb 9.6 oz)    PHYSICAL EXAM: General:  Well appearing. Arrived in wheelchair HEENT: normal Neck: supple. Thick neck Cor: PMI nondisplaced. Irregular rhythm. No rubs, gallops or murmurs. Lungs: clear Abdomen: obese, soft, nontender, nondistended.  Extremities: no cyanosis, clubbing, rash, trace edema, chronic venous stasis changes Neuro: alert & oriented x 3. Affect flat.  ECG: Atrial fib vs flutter,  83 bpm   ASSESSMENT & PLAN: HFmrEF w/ RV failure -Echo 08/24: EF 49%, interventricular septum flattened in systole consistent with RV pressure overload, RV mildly reduced, RVSP 45 mmHg, mild BAE, moderate to severe TR, dilated IVC -Suspect morbid obesity and OSA/OHS major contributors. May also have component of tachy-mediated, in Afib with RVR at time of recent admission. Suspect had been in Afib since admission in [place] back in April.   -NYHA III, confounded by physical deconditioning and body habitus -Volume looks okay on exam. Continue 80 mg lasix BID. -Continue Toprol XL 100 mg daily -Add Losartan 25 mg daily. If tolerated, can switch to [employer]. -Consider spiro next -Did not add SGLT2i d/t super morbid obesity and concern for GU infections -Treatment of  OSA/OHS and weight loss are imperative  2. Atrial fibrillation/flutter -Suspect persistent since at least April 2024 -Rate controlled with amiodarone 200 mg BID and metoprolol xl.  -Refer to Afib for management. Maintenance/restoration of SR will likely be difficult without treatment of her OSA/OHS.  3. Tricuspid regurgitation -Moderate to severe on echo -Will need to follow  4. OSA/OHS Morbid obesity -Recommend referral for sleep study -Consider GLP-1 RA for weight loss  5. DM II -A1c 7.8% -On oral agents -No SGLT2i as above    Refilled cardiac medications for 2-3 months. Will need additional refills from Cardiology.  Seems to have poor insight into the severity of her health issues.  Declined SNF at discharge. HFSW to see, screen for additional needs and SDOH  Referred to HFSW (PCP, Medications, Transportation, ETOH Abuse, Drug Abuse, Insurance, Financial ): Yes Refer to Pharmacy: No Refer to Home Health: No Refer to Advanced Heart Failure Clinic: No Refer to General Cardiology: Yes  Follow up  PRN. Referred to Cardiology and Afib clinic.

## 2022-10-06 NOTE — Patient Instructions (Addendum)
Medication Changes:  START: LOSARTAN 25 mg daily   Lab Work:  Labs done today, your results will be available in MyChart, we will contact you for abnormal readings.  Referrals:  REFERRAL TO ATRIAL FIBRILLATION CLINIC- SOMEONE WILL CALL YOU TO GET YOU SCHEDULED FOR THIS  Follow-Up in: WITH GENERAL CARDIOLOGY PA EVAN WILLIAMS ON 10/25/22 AT 8:20AM AT [address]  At the [facility], you and your health needs are our priority. We have a designated team specialized in the treatment of Heart Failure. This Care Team includes your primary Heart Failure Specialized Cardiologist (physician), Advanced Practice Providers (APPs- Physician Assistants and Nurse Practitioners), and Pharmacist who all work together to provide you with the care you need, when you need it.   You may see any of the following providers on your designated Care Team at your next follow up:  Dr. Arvilla Meres Dr. Marca Ancona Dr. Marcos Eke, NP Robbie Lis, [place] [facility] [place], [place] Brynda Peon, NP Karle Plumber, PharmD   Please be sure to bring in all your medications bottles to every appointment.   Need to Contact us:  If you have any questions or concerns before your next appointment please send [place] a message through [place] or call our office at [phone].    TO LEAVE A MESSAGE FOR THE NURSE SELECT OPTION 2, PLEASE LEAVE A MESSAGE INCLUDING: YOUR NAME DATE OF BIRTH CALL BACK NUMBER REASON FOR CALL**this is important as we prioritize the call backs  YOU WILL RECEIVE A CALL BACK THE SAME DAY AS LONG AS YOU CALL BEFORE 4:00 PM

## 2022-10-06 NOTE — Progress Notes (Signed)
CSW referred to assist patient with some community resources to assist at home. Patient reports she is fine at home and denies any concerns other than she needs a bedside commode. CSW contacted inpatient case manager who ordered upon discharge at last hospitalization and was informed that [employer] delivered the commode on 09-24-22 to patient's home. CSW provided patient with the contact information for [employer] to follow up. CSW available as needed. Lasandra Beech, LCSW, CCSW-MCS [phone]

## 2022-10-07 LAB — CULTURE, FUNGUS WITHOUT SMEAR

## 2022-10-08 ENCOUNTER — Telehealth: Payer: Self-pay

## 2022-10-08 NOTE — Telephone Encounter (Signed)
-----   Message from Victoriano Lain sent at 10/08/2022  8:59 AM EDT ----- Labs noted, let patient know some mild elevated inflammatory markers, still low suspicion for vertebral infection and will monitor off antibiotics for now.

## 2022-10-08 NOTE — Telephone Encounter (Signed)
Left voicemail asking patient to return my call.   Billey Wojciak P Farrin Shadle, CMA  

## 2022-10-10 LAB — CULTURE, FUNGUS WITHOUT SMEAR: Special Requests: 9

## 2022-10-12 ENCOUNTER — Encounter (HOSPITAL_COMMUNITY): Payer: Self-pay | Admitting: Physician Assistant

## 2022-10-12 ENCOUNTER — Ambulatory Visit (HOSPITAL_COMMUNITY)
Admission: RE | Admit: 2022-10-12 | Discharge: 2022-10-12 | Disposition: A | Discharge status: Home / Self Care | Payer: Medicare HMO | Source: Ambulatory Visit | Attending: Physician Assistant | Admitting: Physician Assistant

## 2022-10-12 ENCOUNTER — Telehealth (HOSPITAL_COMMUNITY): Payer: Self-pay

## 2022-10-12 VITALS — BP 120/90 | HR 118 | Ht 64.0 in | Wt 277.2 lb

## 2022-10-12 DIAGNOSIS — I480 Paroxysmal atrial fibrillation: Secondary | ICD-10-CM

## 2022-10-12 DIAGNOSIS — I5022 Chronic systolic (congestive) heart failure: Secondary | ICD-10-CM | POA: Insufficient documentation

## 2022-10-12 DIAGNOSIS — I110 Hypertensive heart disease with heart failure: Secondary | ICD-10-CM | POA: Diagnosis not present

## 2022-10-12 DIAGNOSIS — J449 Chronic obstructive pulmonary disease, unspecified: Secondary | ICD-10-CM | POA: Diagnosis not present

## 2022-10-12 DIAGNOSIS — E662 Morbid (severe) obesity with alveolar hypoventilation: Secondary | ICD-10-CM | POA: Diagnosis not present

## 2022-10-12 DIAGNOSIS — Z5181 Encounter for therapeutic drug level monitoring: Secondary | ICD-10-CM

## 2022-10-12 DIAGNOSIS — I5033 Acute on chronic diastolic (congestive) heart failure: Secondary | ICD-10-CM | POA: Diagnosis present

## 2022-10-12 DIAGNOSIS — I4819 Other persistent atrial fibrillation: Secondary | ICD-10-CM | POA: Diagnosis not present

## 2022-10-12 DIAGNOSIS — Z6841 Body Mass Index (BMI) 40.0 and over, adult: Secondary | ICD-10-CM | POA: Insufficient documentation

## 2022-10-12 DIAGNOSIS — Z7901 Long term (current) use of anticoagulants: Secondary | ICD-10-CM | POA: Diagnosis not present

## 2022-10-12 DIAGNOSIS — D6869 Other thrombophilia: Secondary | ICD-10-CM | POA: Insufficient documentation

## 2022-10-12 DIAGNOSIS — I4891 Unspecified atrial fibrillation: Secondary | ICD-10-CM | POA: Diagnosis present

## 2022-10-12 DIAGNOSIS — Z79899 Other long term (current) drug therapy: Secondary | ICD-10-CM

## 2022-10-12 DIAGNOSIS — R9431 Abnormal electrocardiogram [ECG] [EKG]: Secondary | ICD-10-CM | POA: Insufficient documentation

## 2022-10-12 MED ORDER — AMIODARONE HCL 200 MG PO TABS: 200.0000 mg | ORAL_TABLET | Freq: Every day | ORAL | 3 refills | Status: DC

## 2022-10-12 NOTE — Progress Notes (Signed)
Primary Care Physician: Rometta Emery, MD Primary Cardiologist: Dietrich Pates, MD Electrophysiologist: None  Referring Physician: Heart Failure TOC   Hubert Azure Linda Chase is a 67 y.o. female with a history of OSA, DM, HTN, cerebral aneurysm s/p clipping, COPD, prior tobacco use, atrial fibrillation who presents for follow up in the [facility].  The patient was admitted in [place] in 04/24 with atrial fibrillation with RVR (uncertain if new). Spontaneously converted to SR.   Presented to ED for evaluation of back pain in 08/24. Admitted with acute respiratory failure with hypoxia and hypercapnia in setting of untreated OSA/OHS/acute CHF and Afib with RVR. Required BiPAP. Echo with EF 50-%, mildly reduced RV, evidence of RV pressure overload w/ flattened interventricular septum in systole, RVSP 44 mmHg, moderate to severe TR. Initially treated with IV diltiazem but later switched to IV amiodarone. Amiodarone switched to PO for loading. In atrial fibrillation at discharge. She was diuresed with IV lasix, 327>>285 lb. Her hospital course was further c/b concern for possible degenerative changes vs discitis/osteomyelitis in T8-T9 spine. She was seen by ID. Initially on IV abx. Unable to get MRI d/t prior cerebral aneurysm coiling. Disc aspirate with no growth. Antibiotics discontinued. Patient is on Eliquis for a CHADS2VASC score of 6.  On follow up today, patient remains in atrial fibrillation. She was confused about her discharge medications and has not taken her metoprolol for the past week. She is still taking amiodarone twice daily. No bleeding issues on anticoagulation. She does have a history of OSA but has never been on a CPAP.  Today, she denies symptoms of palpitations, chest pain, orthopnea, PND, dizziness, presyncope, syncope, bleeding, or neurologic sequela. The patient is tolerating medications without difficulties and is otherwise without complaint today.     Atrial Fibrillation Risk Factors:  she does have symptoms or diagnosis of sleep apnea. she does not have a history of rheumatic fever. The patient does have a history of early familial atrial fibrillation or other arrhythmias. Brother has afib.  Atrial Fibrillation Management history:  Previous antiarrhythmic drugs: amiodarone  Previous cardioversions: none Previous ablations: none Anticoagulation history: Eliquis  ROS- All systems are reviewed and negative except as per the HPI above.  Past Medical History:  Diagnosis Date   Arthritis    Cerebral aneurysm    x2   Depression    Diabetes mellitus    Hypertension     Current Outpatient Medications  Medication Sig Dispense Refill   albuterol (VENTOLIN HFA) 108 (90 Base) MCG/ACT inhaler Inhale 1-2 puffs into the lungs as needed for wheezing or shortness of breath.     amiodarone (PACERONE) 200 MG tablet Take 1 tablet (200 mg total) by mouth 2 (two) times daily. 60 tablet 1   apixaban (ELIQUIS) 5 MG TABS tablet Take 1 tablet (5 mg total) by mouth 2 (two) times daily. 60 tablet 1   atorvastatin (LIPITOR) 20 MG tablet Take 1 tablet (20 mg total) by mouth daily. 90 tablet 3   Calcium-Phosphorus-Vitamin D (CALCIUM/VITAMIN D3/ADULT GUMMY PO) GUMMY VITAMIN D 2000     cholecalciferol (VITAMIN D) 1000 units tablet Take 2,000 Units by mouth daily. Gummies     furosemide (LASIX) 80 MG tablet Take 1 tablet (80 mg total) by mouth 2 (two) times daily. 60 tablet 3   gabapentin (NEURONTIN) 300 MG capsule Take 300 mg by mouth at bedtime.     JANUVIA 25 MG tablet Take 25 mg by mouth daily.  losartan (COZAAR) 25 MG tablet Take 1 tablet (25 mg total) by mouth daily. 90 tablet 3   metFORMIN (GLUCOPHAGE) 1000 MG tablet Take 1,000 mg by mouth 2 (two) times daily with a meal.     metoprolol succinate (TOPROL-XL) 100 MG 24 hr tablet Take 1 tablet (100 mg total) by mouth daily. Take with or immediately following a meal. 30 tablet 1   potassium  chloride (KLOR-CON M) 10 MEQ tablet Take 2 tablets (20 mEq total) by mouth daily. 60 tablet 1   SPIRIVA HANDIHALER 18 MCG inhalation capsule Place 18 mcg into inhaler and inhale daily.     No current facility-administered medications for this encounter.    Physical Exam: BP (!) 120/90   Pulse (!) 118   Ht 5\' 4"  (1.626 m)   Wt 125.7 kg   BMI 47.58 kg/m   GEN: Well nourished, well developed in no acute distress NECK: No JVD; No carotid bruits CARDIAC: Irregularly irregular rate and rhythm, no murmurs, rubs, gallops RESPIRATORY:  Clear to auscultation without rales, wheezing or rhonchi  ABDOMEN: Soft, non-tender, non-distended EXTREMITIES:  Trace bilateral edema, statis dermatitis; No deformity   Wt Readings from Last 3 Encounters:  10/12/22 125.7 kg  10/06/22 124.6 kg  10/05/22 123.8 kg     EKG today demonstrates  Afib with RVR, slow R wave prog Vent. rate 118 BPM PR interval * ms QRS duration 84 ms QT/QTcB 358/501 ms   Echo 09/07/22 demonstrated   1. Left ventricular ejection fraction, by estimation, is 50%. Left  ventricular ejection fraction by 3D volume is 49 %. The left ventricle has  mildly decreased function. The left ventricle demonstrates global  hypokinesis. There is mild left ventricular hypertrophy. Left ventricular diastolic parameters are indeterminate. There is the interventricular septum is flattened in systole, consistent with right ventricular pressure overload.   2. Right ventricular systolic function is mildly reduced. The right  ventricular size is mildly enlarged. Mildly increased right ventricular  wall thickness. There is mildly elevated pulmonary artery systolic  pressure. The estimated right ventricular  systolic pressure is 44.8 mmHg.   3. Left atrial size was mildly dilated.   4. Right atrial size was mildly dilated.   5. The mitral valve is grossly normal. Trivial mitral valve  regurgitation. No evidence of mitral stenosis. Moderate mitral  annular  calcification.   6. There is one beat of systolic flow reversal of hepatic vein spectral  Doppler. Tricuspid valve regurgitation is moderate to severe.   7. Nodular calcification of leaflet tips. The aortic valve is tricuspid.  There is mild calcification of the aortic valve. Aortic valve  regurgitation is trivial. No aortic stenosis is present.   8. Aortic dilatation noted. There is mild dilatation of the ascending  aorta, measuring 42 mm.   9. The inferior vena cava is dilated in size with <50% respiratory  variability, suggesting right atrial pressure of 15 mmHg.    CHA2DS2-VASc Score = 6  The patient's score is based upon: CHF History: 1 HTN History: 1 Diabetes History: 1 Stroke History: 0 Vascular Disease History: 1 Age Score: 1 Gender Score: 1       ASSESSMENT AND PLAN: Persistent Atrial Fibrillation (ICD10:  I48.19) The patient's CHA2DS2-VASc score is 6, indicating a 9.7% annual risk of stroke.   General education about afib provided and questions answered. We also discussed her stroke risk and the risks and benefits of anticoagulation. Resume metoprolol 100 mg daily Decrease amiodarone to 200 mg daily now  that she has loaded for several weeks. After discussing the risks and benefits, will plan for DCCV. She denies any missed doses of anticoagulation.  Continue Eliquis 5 mg BID  Secondary Hypercoagulable State (ICD10:  D68.69) The patient is at significant risk for stroke/thromboembolism based upon her CHA2DS2-VASc Score of 6.  Continue Apixaban (Eliquis).   HTN Stable on current regimen  OSA/OHS The importance of adequate treatment of sleep apnea was discussed today in order to improve our ability to maintain sinus rhythm long term. Patient was diagnosed with OSA remotely in [place]. She has never been on a CPAP. Will refer her to sleep medicine to establish care. She is agreeable to sleep study.   Chronic HFmrEF Fluid status appears stable  Obesity Body mass  index is 47.58 kg/m.  Encouraged lifestyle modification   Follow up with Perlie Gold as scheduled. AF clinic in 3 months.    Addendum: Patient called clinic back to report that she was unsure if she had taken her morning dose of Eliquis. DCCV cancelled. She will need to be on anticoagulation for at least 3 weeks with no missed doses. This can be arranged at her follow up on 10/25/22.    Jorja Loa PA-C Afib Clinic [facility] [address] [place], [place] [address] [phone]

## 2022-10-12 NOTE — Telephone Encounter (Signed)
Spoke with [place], notified her per Dr. Elinor Parkinson that inflammatory markers are mildly elevated, but that she has low suspicion for vertebral infection and that she would like to continue to monitor her off of antibiotics. Reminded her of 10/24 follow up.   She is asking about potassium levels, relayed that this was within normal limits at the time of her last lab draw on 10/06/22. She would like to know if she needs supplementation, advised her to contact PCP.   Patient verbalized understanding and has no further questions.   Sandie Ano, RN

## 2022-10-12 NOTE — Telephone Encounter (Signed)
Patient called in to let us know she may have missed her dose of Eliquis today. She is scheduled for cardioversion tomorrow. Consulted with patient we would need to cancel her cardioversion. Communicated with patient and she verbalized understanding. Notified scheduling to cancel her cardioversion.

## 2022-10-12 NOTE — Patient Instructions (Addendum)
Decrease Amiodarone to 200mg  ONCE a day   Start metoprolol 100mg  ONCE A DAY   Cardioversion scheduled XIP:[id number], October 2nd   - Arrive at the [employer] [facility] and go to admitting at 7:30AM   - Do not eat or drink anything after midnight the night prior to your procedure.   - Take all your morning medication (NO Januvia or metformin in the morning) with a sip of water prior to arrival.  - You will not be able to drive home after your procedure.    - Do NOT miss any doses of your blood thinner - if you should miss a dose please notify our office immediately.   - If you feel as if you go back into normal rhythm prior to scheduled cardioversion, please notify our office immediately.   If your procedure is canceled in the cardioversion suite you will be charged a cancellation fee.

## 2022-10-13 ENCOUNTER — Ambulatory Visit (HOSPITAL_COMMUNITY): Admission: RE | Admit: 2022-10-13 | Payer: Medicare HMO | Source: Home / Self Care | Admitting: Internal Medicine

## 2022-10-13 ENCOUNTER — Encounter (HOSPITAL_COMMUNITY): Admission: RE | Payer: Self-pay | Source: Home / Self Care

## 2022-10-13 SURGERY — CARDIOVERSION
Anesthesia: General

## 2022-10-13 NOTE — Addendum Note (Signed)
Encounter addended by: Danice Goltz, PA on: 10/13/2022 8:31 AM  Actions taken: Clinical Note Signed

## 2022-10-14 ENCOUNTER — Inpatient Hospital Stay: Payer: Medicare HMO | Admitting: Infectious Diseases

## 2022-10-20 ENCOUNTER — Other Ambulatory Visit (HOSPITAL_COMMUNITY): Payer: Medicare HMO

## 2022-10-25 ENCOUNTER — Ambulatory Visit: Payer: Medicare HMO | Attending: Cardiology | Admitting: Cardiology

## 2022-10-25 NOTE — Progress Notes (Deleted)
  Cardiology Office Note:   Date:  10/25/2022  ID:  Linda Chase, DOB 04/02/1955, MRN [id number] PCP: Rometta Emery, MD  [facility] Providers Cardiologist:  Dietrich Pates, MD { Click to update primary MD,subspecialty MD or APP then REFRESH:1}   History of Present Illness:   Linda Chase is a 67 y.o. female ***  Discussed the use of AI scribe software for clinical note transcription with the patient, who gave verbal consent to proceed.  History of Present Illness             Today patient denies chest pain, shortness of breath, lower extremity edema, fatigue, palpitations, melena, hematuria, hemoptysis, diaphoresis, weakness, presyncope, syncope, orthopnea, and PND.   Studies Reviewed:    EKG:        ***  Risk Assessment/Calculations:   {Does this patient have ATRIAL FIBRILLATION?[phone]} No BP recorded.  {Refresh Note OR Click here to enter BP  :1}***        Physical Exam:   VS:  There were no vitals taken for this visit.   Wt Readings from Last 3 Encounters:  10/12/22 277 lb 3.2 oz (125.7 kg)  10/06/22 274 lb 9.6 oz (124.6 kg)  10/05/22 273 lb (123.8 kg)     Physical Exam  Physical Exam           ASSESSMENT AND PLAN:     Assessment and Plan                  {Are you ordering a CV Procedure (e.g. stress test, cath, DCCV, TEE, etc)?   Press F2        :[id number]}   Signed, Perlie Gold, PA-C

## 2022-10-26 ENCOUNTER — Encounter: Payer: Self-pay | Admitting: Cardiology

## 2022-10-27 ENCOUNTER — Other Ambulatory Visit (HOSPITAL_BASED_OUTPATIENT_CLINIC_OR_DEPARTMENT_OTHER): Payer: Self-pay

## 2022-11-02 LAB — ACID FAST CULTURE WITH REFLEXED SENSITIVITIES (MYCOBACTERIA): Acid Fast Culture: NEGATIVE

## 2022-11-03 ENCOUNTER — Telehealth: Payer: Self-pay

## 2022-11-03 NOTE — Telephone Encounter (Signed)
-----   Message from Victoriano Lain sent at 11/03/2022  7:58 AM EDT ----- Please let her know her back cultures  came back negative, nothing to do.

## 2022-11-03 NOTE — Telephone Encounter (Signed)
Called patient to relay results, no answer and mailbox full. Patient has appointment with Dr. Elinor Parkinson tomorrow and can discuss results then.   Sandie Ano, RN

## 2022-11-04 ENCOUNTER — Ambulatory Visit: Payer: Medicare HMO | Admitting: Infectious Diseases

## 2022-11-04 ENCOUNTER — Telehealth: Payer: Self-pay

## 2022-11-04 NOTE — Telephone Encounter (Signed)
Called patient to reschedule missed appointment. No answer, unable leave message voicemail full.

## 2022-11-18 ENCOUNTER — Ambulatory Visit (INDEPENDENT_AMBULATORY_CARE_PROVIDER_SITE_OTHER): Payer: Medicare HMO | Admitting: Podiatry

## 2022-11-18 ENCOUNTER — Encounter: Payer: Self-pay | Admitting: Podiatry

## 2022-11-18 DIAGNOSIS — Q828 Other specified congenital malformations of skin: Secondary | ICD-10-CM

## 2022-11-18 DIAGNOSIS — E119 Type 2 diabetes mellitus without complications: Secondary | ICD-10-CM

## 2022-11-18 DIAGNOSIS — B351 Tinea unguium: Secondary | ICD-10-CM

## 2022-11-18 DIAGNOSIS — M79675 Pain in left toe(s): Secondary | ICD-10-CM

## 2022-11-18 NOTE — Progress Notes (Signed)
Patient ID: Linda Chase, female   DOB: 1955/05/04, 67 y.o.   MRN: [id number] Complaint:  Visit Type: Patient returns to my office for continued preventative foot care services. Complaint: Patient states" my nails have grown long and thick and become painful to walk and wear shoes" Patient has been diagnosed with DM with neuropathy.. She  presents for preventative foot care services. No changes to ROS  Podiatric Exam: Vascular: dorsalis pedis and posterior tibial pulses are  weakly palpable bilateral. Capillary return is immediate. Temperature gradient is WNL. Skin turgor WNL Absent hair digits  B/L. Sensorium: Semmes Weinstein monofilament test diminished  Diminished tactile sensation bilaterally. Nail Exam: Pt has thick disfigured discolored nails with subungual debris noted bilateral entire nail hallux through fifth toenails Ulcer Exam: There is no evidence of ulcer or pre-ulcerative changes or infection. Orthopedic Exam: Muscle tone and strength are WNL. No limitations in general ROM. No crepitus or effusions noted. Foot type and digits show no abnormalities. Bony prominences are unremarkable. Skin   No infection or ulcers.    Diagnosis:  Tinea unguium, Pain in right toe, pain in left toes.    Treatment & Plan Procedures and Treatment: Consent by patient was obtained for treatment procedures. The patient understood the discussion of treatment and procedures well. All questions were answered thoroughly reviewed. Debridement of mycotic and hypertrophic toenails, 1 through 5 bilateral and clearing of subungual debris.  Return Visit-Office Procedure: Patient instructed to return to the office for a follow up visit 3 months. for continued evaluation and treatment.  Helane Gunther DPM

## 2022-11-27 ENCOUNTER — Encounter (HOSPITAL_COMMUNITY): Payer: Self-pay

## 2022-11-27 ENCOUNTER — Inpatient Hospital Stay (HOSPITAL_COMMUNITY)
Admission: EM | Admit: 2022-11-27 | Discharge: 2022-11-30 | DRG: 291 | Disposition: A | Discharge status: Home Health | Payer: Medicare HMO | Attending: Internal Medicine | Admitting: Internal Medicine

## 2022-11-27 ENCOUNTER — Emergency Department (HOSPITAL_COMMUNITY): Payer: Medicare HMO

## 2022-11-27 ENCOUNTER — Other Ambulatory Visit: Payer: Self-pay

## 2022-11-27 DIAGNOSIS — I2729 Other secondary pulmonary hypertension: Secondary | ICD-10-CM | POA: Diagnosis present

## 2022-11-27 DIAGNOSIS — I5082 Biventricular heart failure: Secondary | ICD-10-CM | POA: Diagnosis present

## 2022-11-27 DIAGNOSIS — I071 Rheumatic tricuspid insufficiency: Secondary | ICD-10-CM | POA: Diagnosis present

## 2022-11-27 DIAGNOSIS — R0902 Hypoxemia: Secondary | ICD-10-CM

## 2022-11-27 DIAGNOSIS — Z79899 Other long term (current) drug therapy: Secondary | ICD-10-CM

## 2022-11-27 DIAGNOSIS — I50813 Acute on chronic right heart failure: Secondary | ICD-10-CM | POA: Diagnosis not present

## 2022-11-27 DIAGNOSIS — R0602 Shortness of breath: Secondary | ICD-10-CM | POA: Diagnosis not present

## 2022-11-27 DIAGNOSIS — I509 Heart failure, unspecified: Secondary | ICD-10-CM

## 2022-11-27 DIAGNOSIS — E662 Morbid (severe) obesity with alveolar hypoventilation: Secondary | ICD-10-CM | POA: Diagnosis present

## 2022-11-27 DIAGNOSIS — M546 Pain in thoracic spine: Secondary | ICD-10-CM

## 2022-11-27 DIAGNOSIS — Z87891 Personal history of nicotine dependence: Secondary | ICD-10-CM

## 2022-11-27 DIAGNOSIS — Z7984 Long term (current) use of oral hypoglycemic drugs: Secondary | ICD-10-CM

## 2022-11-27 DIAGNOSIS — I4819 Other persistent atrial fibrillation: Secondary | ICD-10-CM | POA: Diagnosis present

## 2022-11-27 DIAGNOSIS — J9601 Acute respiratory failure with hypoxia: Secondary | ICD-10-CM | POA: Insufficient documentation

## 2022-11-27 DIAGNOSIS — I110 Hypertensive heart disease with heart failure: Secondary | ICD-10-CM | POA: Diagnosis not present

## 2022-11-27 DIAGNOSIS — I2510 Atherosclerotic heart disease of native coronary artery without angina pectoris: Secondary | ICD-10-CM | POA: Diagnosis present

## 2022-11-27 DIAGNOSIS — G4733 Obstructive sleep apnea (adult) (pediatric): Secondary | ICD-10-CM | POA: Diagnosis present

## 2022-11-27 DIAGNOSIS — I5033 Acute on chronic diastolic (congestive) heart failure: Secondary | ICD-10-CM | POA: Diagnosis present

## 2022-11-27 DIAGNOSIS — Z555 Less than a high school diploma: Secondary | ICD-10-CM

## 2022-11-27 DIAGNOSIS — Z7901 Long term (current) use of anticoagulants: Secondary | ICD-10-CM

## 2022-11-27 DIAGNOSIS — Z6841 Body Mass Index (BMI) 40.0 and over, adult: Secondary | ICD-10-CM

## 2022-11-27 DIAGNOSIS — J449 Chronic obstructive pulmonary disease, unspecified: Secondary | ICD-10-CM | POA: Diagnosis present

## 2022-11-27 DIAGNOSIS — E119 Type 2 diabetes mellitus without complications: Secondary | ICD-10-CM

## 2022-11-27 LAB — COMPREHENSIVE METABOLIC PANEL
ALT: 16 U/L (ref 0–44)
AST: 21 U/L (ref 15–41)
Albumin: 3.4 g/dL — ABNORMAL LOW (ref 3.5–5.0)
Alkaline Phosphatase: 60 U/L (ref 38–126)
Anion gap: 8 (ref 5–15)
BUN: 16 mg/dL (ref 8–23)
CO2: 28 mmol/L (ref 22–32)
Calcium: 9.1 mg/dL (ref 8.9–10.3)
Chloride: 104 mmol/L (ref 98–111)
Creatinine, Ser: 1.13 mg/dL — ABNORMAL HIGH (ref 0.44–1.00)
GFR, Estimated: 53 mL/min — ABNORMAL LOW (ref >60)
Glucose, Bld: 121 mg/dL — ABNORMAL HIGH (ref 70–99)
Potassium: 4.6 mmol/L (ref 3.5–5.1)
Sodium: 140 mmol/L (ref 135–145)
Total Bilirubin: 0.7 mg/dL (ref <1.2)
Total Protein: 7.7 g/dL (ref 6.5–8.1)

## 2022-11-27 LAB — CBC
HCT: 42.9 % (ref 36.0–46.0)
Hemoglobin: 12.5 g/dL (ref 12.0–15.0)
MCH: 25.1 pg — ABNORMAL LOW (ref 26.0–34.0)
MCHC: 29.1 g/dL — ABNORMAL LOW (ref 30.0–36.0)
MCV: 86.1 fL (ref 80.0–100.0)
Platelets: 212 10*3/uL (ref 150–400)
RBC: 4.98 MIL/uL (ref 3.87–5.11)
RDW: 16.9 % — ABNORMAL HIGH (ref 11.5–15.5)
WBC: 8.8 10*3/uL (ref 4.0–10.5)
nRBC: 0 % (ref 0.0–0.2)

## 2022-11-27 LAB — BLOOD GAS, VENOUS
Acid-Base Excess: 8.1 mmol/L — ABNORMAL HIGH (ref 0.0–2.0)
Bicarbonate: 36.6 mmol/L — ABNORMAL HIGH (ref 20.0–28.0)
O2 Saturation: 55.9 %
Patient temperature: 37
pCO2, Ven: 71 mm[Hg] (ref 44–60)
pH, Ven: 7.32 (ref 7.25–7.43)
pO2, Ven: 34 mm[Hg] (ref 32–45)

## 2022-11-27 LAB — SEDIMENTATION RATE: Sed Rate: 38 mm/h — ABNORMAL HIGH (ref 0–22)

## 2022-11-27 LAB — TROPONIN I (HIGH SENSITIVITY)
Troponin I (High Sensitivity): 9 ng/L (ref <18)
Troponin I (High Sensitivity): 8 ng/L (ref <18)

## 2022-11-27 LAB — GLUCOSE, CAPILLARY: Glucose-Capillary: 136 mg/dL — ABNORMAL HIGH (ref 70–99)

## 2022-11-27 LAB — MAGNESIUM: Magnesium: 2.1 mg/dL (ref 1.7–2.4)

## 2022-11-27 LAB — BRAIN NATRIURETIC PEPTIDE: B Natriuretic Peptide: 213.7 pg/mL — ABNORMAL HIGH (ref 0.0–100.0)

## 2022-11-27 LAB — C-REACTIVE PROTEIN: CRP: 2.6 mg/dL — ABNORMAL HIGH (ref <1.0)

## 2022-11-27 MED ORDER — AMIODARONE HCL 200 MG PO TABS
200.0000 mg | ORAL_TABLET | Freq: Every day | ORAL | Status: DC
  Administered 2022-11-28 – 2022-11-30 (×3): 200 mg via ORAL
  Filled 2022-11-27 (×4): qty 1

## 2022-11-27 MED ORDER — IOHEXOL 350 MG/ML SOLN
75.0000 mL | Freq: Once | INTRAVENOUS | Status: AC | PRN
  Administered 2022-11-27: 75 mL via INTRAVENOUS

## 2022-11-27 MED ORDER — INSULIN ASPART 100 UNIT/ML IJ SOLN
0.0000 [IU] | Freq: Three times a day (TID) | INTRAMUSCULAR | Status: DC
  Administered 2022-11-27: 2 [IU] via SUBCUTANEOUS
  Administered 2022-11-28: 3 [IU] via SUBCUTANEOUS
  Administered 2022-11-28 – 2022-11-29 (×2): 2 [IU] via SUBCUTANEOUS
  Administered 2022-11-30: 3 [IU] via SUBCUTANEOUS

## 2022-11-27 MED ORDER — LOSARTAN POTASSIUM 25 MG PO TABS
25.0000 mg | ORAL_TABLET | Freq: Every day | ORAL | Status: DC
  Administered 2022-11-27 – 2022-11-30 (×4): 25 mg via ORAL
  Filled 2022-11-27 (×4): qty 1

## 2022-11-27 MED ORDER — APIXABAN 5 MG PO TABS
5.0000 mg | ORAL_TABLET | Freq: Two times a day (BID) | ORAL | Status: DC
  Administered 2022-11-27 – 2022-11-30 (×7): 5 mg via ORAL
  Filled 2022-11-27 (×7): qty 1

## 2022-11-27 MED ORDER — MORPHINE SULFATE (PF) 2 MG/ML IV SOLN
2.0000 mg | Freq: Once | INTRAVENOUS | Status: AC
  Administered 2022-11-27: 2 mg via INTRAVENOUS
  Filled 2022-11-27: qty 1

## 2022-11-27 MED ORDER — ACETAMINOPHEN 650 MG RE SUPP: 650.0000 mg | Freq: Four times a day (QID) | RECTAL | Status: DC | PRN

## 2022-11-27 MED ORDER — UMECLIDINIUM BROMIDE 62.5 MCG/ACT IN AEPB
1.0000 | INHALATION_SPRAY | Freq: Every day | RESPIRATORY_TRACT | Status: DC
  Administered 2022-11-28 – 2022-11-29 (×2): 1 via RESPIRATORY_TRACT
  Filled 2022-11-27 (×2): qty 7

## 2022-11-27 MED ORDER — GABAPENTIN 300 MG PO CAPS
300.0000 mg | ORAL_CAPSULE | Freq: Every day | ORAL | Status: DC
  Administered 2022-11-27 – 2022-11-29 (×3): 300 mg via ORAL
  Filled 2022-11-27 (×3): qty 1

## 2022-11-27 MED ORDER — LINAGLIPTIN 5 MG PO TABS
5.0000 mg | ORAL_TABLET | Freq: Every day | ORAL | Status: DC
  Administered 2022-11-27 – 2022-11-30 (×4): 5 mg via ORAL
  Filled 2022-11-27 (×4): qty 1

## 2022-11-27 MED ORDER — METOPROLOL SUCCINATE ER 50 MG PO TB24
50.0000 mg | ORAL_TABLET | Freq: Every day | ORAL | Status: DC
  Administered 2022-11-29 – 2022-11-30 (×2): 50 mg via ORAL
  Filled 2022-11-27 (×2): qty 1
  Filled 2022-11-27: qty 2
  Filled 2022-11-27: qty 1

## 2022-11-27 MED ORDER — HYDROCODONE-ACETAMINOPHEN 5-325 MG PO TABS
1.0000 | ORAL_TABLET | ORAL | Status: DC | PRN
  Administered 2022-11-28 (×4): 2 via ORAL
  Filled 2022-11-27 (×4): qty 2

## 2022-11-27 MED ORDER — MORPHINE SULFATE (PF) 2 MG/ML IV SOLN
2.0000 mg | INTRAVENOUS | Status: DC | PRN
  Administered 2022-11-27 (×2): 2 mg via INTRAVENOUS
  Filled 2022-11-27 (×2): qty 1

## 2022-11-27 MED ORDER — METHOCARBAMOL 500 MG PO TABS
500.0000 mg | ORAL_TABLET | Freq: Three times a day (TID) | ORAL | Status: DC | PRN
  Administered 2022-11-27 – 2022-11-28 (×2): 500 mg via ORAL
  Filled 2022-11-27 (×2): qty 1

## 2022-11-27 MED ORDER — FUROSEMIDE 10 MG/ML IJ SOLN
40.0000 mg | Freq: Two times a day (BID) | INTRAMUSCULAR | Status: DC
  Administered 2022-11-27 – 2022-11-29 (×5): 40 mg via INTRAVENOUS
  Filled 2022-11-27 (×6): qty 4

## 2022-11-27 MED ORDER — HYDROCODONE-ACETAMINOPHEN 5-325 MG PO TABS
1.0000 | ORAL_TABLET | Freq: Once | ORAL | Status: AC
  Administered 2022-11-27: 1 via ORAL
  Filled 2022-11-27: qty 1

## 2022-11-27 MED ORDER — METHOCARBAMOL 1000 MG/10ML IJ SOLN
500.0000 mg | Freq: Four times a day (QID) | INTRAMUSCULAR | Status: DC | PRN
Start: 2022-11-27 — End: 2022-11-27

## 2022-11-27 MED ORDER — FUROSEMIDE 10 MG/ML IJ SOLN
40.0000 mg | Freq: Once | INTRAMUSCULAR | Status: AC
  Administered 2022-11-27: 40 mg via INTRAVENOUS
  Filled 2022-11-27: qty 4

## 2022-11-27 MED ORDER — ATORVASTATIN CALCIUM 10 MG PO TABS
20.0000 mg | ORAL_TABLET | Freq: Every day | ORAL | Status: DC
  Administered 2022-11-27 – 2022-11-30 (×4): 20 mg via ORAL
  Filled 2022-11-27 (×4): qty 2

## 2022-11-27 MED ORDER — LIDOCAINE 5 % EX PTCH
1.0000 | MEDICATED_PATCH | CUTANEOUS | Status: DC
  Administered 2022-11-27 – 2022-11-29 (×3): 1 via TRANSDERMAL
  Filled 2022-11-27 (×3): qty 1

## 2022-11-27 MED ORDER — ALBUTEROL SULFATE (2.5 MG/3ML) 0.083% IN NEBU: 2.5000 mg | INHALATION_SOLUTION | Freq: Four times a day (QID) | RESPIRATORY_TRACT | Status: DC | PRN

## 2022-11-27 MED ORDER — ACETAMINOPHEN 325 MG PO TABS
650.0000 mg | ORAL_TABLET | Freq: Four times a day (QID) | ORAL | Status: DC | PRN
  Administered 2022-11-29: 650 mg via ORAL
  Filled 2022-11-27 (×2): qty 2

## 2022-11-27 NOTE — Assessment & Plan Note (Signed)
Stable with no complaints of chest pain  Continue lipitor/eliquis.  No longer on beta blocker due to hypotension

## 2022-11-27 NOTE — Assessment & Plan Note (Addendum)
CHA2DS2-VASc score is 6  NSR/rate controlled today  Saw atrial fib clinic on 10/1 where cardioversion was going to be scheduled, but she had missed an eliquis dose.  Was supposed to follow back up, but has  not happened yet Continue amiodarone and eliquis  Decrease metoprolol to 50mg  as heart rate in mid to upper 40s.

## 2022-11-27 NOTE — ED Notes (Signed)
RN provided pt bag lunch and coffee

## 2022-11-27 NOTE — Assessment & Plan Note (Signed)
A1c of 7.8 in August 2024 Continue januvia, hold metformin  SSI and accuchecks qac/hs

## 2022-11-27 NOTE — ED Notes (Signed)
Placed patient back on the monitor patient is resting with call bell in reach

## 2022-11-27 NOTE — ED Triage Notes (Signed)
Pt presents via EMS c/o mid thoracic back pain and SOB. EMS reports saturation in low 80s upon EMS arrival. Pt A&O x4 currently. Reports hx of CHF, reports missed dose of Lasix yesterday,

## 2022-11-27 NOTE — Assessment & Plan Note (Addendum)
67 year old female with known cor pulmonale and HFpEF presenting with back pain and worsening dyspnea on exertion found to be hypoxic on room air to 82% with findings on CXR of pulmonary venous congestion/right pleural effusion with increased leg swelling, elevated BNP, untreated osa/ohs consistent with acute on chronic right heart failure -obs to telel  -given 40mg  of IV lasix in ED, continue 40mg  IV BID -was discharged on 80mg  BID, but unsure of compliance with this. She thinks she has missed some doses or takes every other day. Will start with the 40mg  BID and titrate accordingly  -echo in 08/2022: EF of 50% with mildly decreased LVF. Mildly reduced RVF. Moderate to severe tricuspid regurg -continue metoprolol, reduced to 50mg  due to bradycardia  -on ARB, considering entresto at CHF clinic, but never followed back up as scheduled  -not a candidate for  SGLT2i due to morbid obesity/risk of GU infections  -strict I/O and daily weights. D/c weight from hospital was 285#. Has not been weighed yet today.  -has not had her sleep study so continues to have untreated OSA/OHS driving repeat exacerbations.  -CTA negative for PE  -start back bipap at night. She states she does not wear oxygen during the day at home.  -wean oxygen as tolerated with diuresis  -consult cards if no improvement

## 2022-11-27 NOTE — ED Provider Notes (Signed)
[facility] Emergency Department Provider Note MRN:  [id number]  Arrival date & time: 11/27/22     Chief Complaint   Shortness of Breath and Back Pain   History of Present Illness   Linda Chase is a 67 y.o. year-old female with a history of hypertension, diabetes presenting to the ED with chief complaint of shortness of breath back pain.  Severe thoracic back pain causing shortness of breath, patient called EMS.  Hypoxic on arrival.  Review of Systems  A thorough review of systems was obtained and all systems are negative except as noted in the HPI and PMH.   Patient's Health History    Past Medical History:  Diagnosis Date   Arthritis    Cerebral aneurysm    x2   Depression    Diabetes mellitus    Hypertension     Past Surgical History:  Procedure Laterality Date   BRAIN SURGERY     double aneurysm   IR THORACIC DISC ASPIRATION W/IMG GUIDE  09/14/2022   IR THORACIC DISC ASPIRATION W/IMG GUIDE  09/16/2022   RADIOLOGY WITH ANESTHESIA N/A 09/16/2022   Procedure: IR thoracic disc aspiration;  Surgeon: Baldemar Lenis, MD;  Location: [facility] OR;  Service: Radiology;  Laterality: N/A;    Family History  Family history unknown: Yes    Social History   Socioeconomic History   Marital status: Legally Separated    Spouse name: Not on file   Number of children: Not on file   Years of education: Not on file   Highest education level: 11th grade  Occupational History   Occupation: disabled  Tobacco Use   Smoking status: Former    Types: Cigarettes   Smokeless tobacco: Never   Tobacco comments:    Former smoker 10/12/22  Vaping Use   Vaping status: Never Used  Substance and Sexual Activity   Alcohol use: No   Drug use: No   Sexual activity: Never  Other Topics Concern   Not on file  Social History Narrative   Not on file   Social Determinants of Health   Financial Resource Strain: Low Risk  (09/22/2022)   Overall Financial  Resource Strain (CARDIA)    Difficulty of Paying Living Expenses: Not hard at all  Food Insecurity: No Food Insecurity (09/09/2022)   Hunger Vital Sign    Worried About Running Out of Food in the Last Year: Never true    Ran Out of Food in the Last Year: Never true  Transportation Needs: No Transportation Needs (09/22/2022)   PRAPARE - Administrator, Civil Service (Medical): No    Lack of Transportation (Non-Medical): No  Physical Activity: Not on file  Stress: Not on file  Social Connections: Not on file  Intimate Partner Violence: Not At Risk (09/09/2022)   Humiliation, Afraid, Rape, and Kick questionnaire    Fear of Current or Ex-Partner: No    Emotionally Abused: No    Physically Abused: No    Sexually Abused: No     Physical Exam   Vitals:   11/27/22 0559 11/27/22 0602  BP: (!) 188/81   Pulse: (!) 59   Resp: (!) 22   Temp: 98.5 F (36.9 C)   SpO2: (!) 82% 100%    CONSTITUTIONAL: Chronically ill-appearing, moderate distress due to pain NEURO/PSYCH:  Alert and oriented x 3, no focal deficits EYES:  eyes equal and reactive ENT/NECK:  no LAD, no JVD CARDIO: Regular rate, well-perfused, normal S1  and S2 PULM:  CTAB no wheezing or rhonchi GI/GU: Tachypneic MSK/SPINE:  No gross deformities, no edema SKIN:  no rash, atraumatic   *Additional and/or pertinent findings included in MDM below  Diagnostic and Interventional Summary    EKG Interpretation Date/Time:  Saturday November 27 2022 05:57:04 EST Ventricular Rate:  61 PR Interval:  198 QRS Duration:  100 QT Interval:  491 QTC Calculation: 495 R Axis:   269  Text Interpretation: Sinus rhythm Multiform ventricular premature complexes Left anterior fascicular block Consider right ventricular hypertrophy Borderline prolonged QT interval Confirmed by Kennis Carina [phone]) on 11/27/2022 6:17:41 AM       Labs Reviewed  CBC - Abnormal; Notable for the following components:      Result Value   MCH 25.1  (*)    MCHC 29.1 (*)    RDW 16.9 (*)    All other components within normal limits  COMPREHENSIVE METABOLIC PANEL  TROPONIN I (HIGH SENSITIVITY)    DG Chest Port 1 View    (Results Pending)  CT Angio Chest Pulmonary Embolism (PE) W or WO Contrast    (Results Pending)  CT T-SPINE NO CHARGE    (Results Pending)    Medications - No data to display   Procedures  /  Critical Care Procedures  ED Course and Medical Decision Making  Initial Impression and Ddx Patient is hypoxic on arrival in the mid to low 80s requiring supplemental oxygen.  In obvious discomfort due to thoracic back pain.  Considering PE versus less likely dissection.  Awaiting labs, CT imaging.  Past medical/surgical history that increases complexity of ED encounter: None  Interpretation of Diagnostics I personally reviewed the EKG and my interpretation is as follows: Sinus rhythm  Labs, CTA pending  Patient Reassessment and Ultimate Disposition/Management     Signed out to oncoming provider at shift change.  Patient management required discussion with the following services or consulting groups:  None  Complexity of Problems Addressed Acute illness or injury that poses threat of life of bodily function  Additional Data Reviewed and Analyzed Further history obtained from: EMS on arrival  Additional Factors Impacting ED Encounter Risk Consideration of hospitalization  Elmer Sow. Pilar Plate, MD [facility] Emergency Medicine [facility] [email or web address]  Final Clinical Impressions(s) / ED Diagnoses     ICD-10-CM   1. Shortness of breath  R06.02     2. Hypoxia  R09.02     3. Acute midline thoracic back pain  M54.6       ED Discharge Orders     None        Discharge Instructions Discussed with and Provided to Patient:   Discharge Instructions   None      Sabas Sous, MD 11/27/22 [phone]

## 2022-11-27 NOTE — ED Notes (Signed)
IV RN u/t obtain an IV at this time. IV RN consulting a second Charity fundraiser.

## 2022-11-27 NOTE — Assessment & Plan Note (Addendum)
When admitted in August 2024 there was initial concern of possible discitis/OM; however, MRI could not be obtained due to aneurysm clipping and inability to get records.  - Blood cultures were negative, aspirate of the disc space negative for growth at that time.  -she had follow up with ID on 9/24 with labs and low suspicion for infection. She missed her f/u appointment in October -she has had no fever/chills and on precipitating events, but felt the back pain return this week which prompted ED visit.  -she has no TTP on exam, erythema, leukocytosis or SIRS criteria -repeat CT of spine with no acute changes/findings -inflammatory markers pending  -she has signed ROI for records from hospital in [place] this week. Hopefully we can get records about her clipping and if MRI compatible and can scan her if able while she is here. [facility] [place]) -did discuss unsure if her CHF exacerbation/effusion could also be contributing since coincides again with CHF exacerbation  -pain control with heating pad, lidocaine patch, norco and severe pain morphine  -robaxin PRN -weight loss/posture and core strengthening will also be beneficial

## 2022-11-27 NOTE — ED Notes (Signed)
RN offered pt a pillow. Pt declined. Pt hob lowered.

## 2022-11-27 NOTE — H&P (Signed)
History and Physical    Patient: Linda Chase ZOX:[id number] DOB: 24-Sep-1955 DOA: 11/27/2022 DOS: the patient was seen and examined on 11/27/2022 PCP: Rometta Emery, MD  Patient coming from: Home - lives with her son. Uses cane to ambulate.    Chief Complaint: shortness of breath and back pain   HPI: Linda Chase is a 67 y.o. female with medical history significant of  HTN, PAF on AC, T2DM, cerebral aneurysm s/p craniotomy and clipping, COPD, untreated OSA/OHS,  CAD, HFpEF, cor pulmonale, and morbid obesity who presented to Ed with complaints of low back pain and shortness of breath found to be hypoxic on room air to 80s. She states she came to ED because her back hurt her and it hurt to breath. She felt like the pain was cutting off her breathing and it hurt to talk. Her back pain is in her upper back, center of back. Does not hurt to touch. Moving makes it worse. It is not as bad as it was previously. Nothing makes it better. She denies any trauma. She has tried lidocaine patch, oil and massage. Pain rated as 10/10 and described as sharp. Pain is intermittent, but she felt it coming on a few days ago. No weakness/tingling in her arms/legs. No urinary incontinence.   She has had no fever/chills, N/V/D. She has missed some lasix pills. She has not missed an eliquis. She has increased swelling in her ankles, worsening dyspnea on exertion and orthopnea. She has not weighted herself. Denies any increased salty foods or recent illness. Does not weight herself at home.    Recent diagnosis of heart failure while in [place] in April and was discharged on lasix at that time, never resumed. Admitted to [facility] in 08/2022 for acute on chronic right sided heart failure and respiratory failure. Discharge weight 285 pounds.    Denies any fever/chills, vision changes/headaches, chest pain or palpitations, cough, abdominal pain, N/V/D, dysuria or leg swelling.    She does not smoke (stopped in 2023)  and does not drink alcohol.   ER Course:  vitals: afebrile, bp: 188/81, HR; 59, RR: 22, oxygen:  82% on RA>100% on 2L [place] Pertinent labs: bnp: 213  CXR: cardiomegaly with pulmonary venous congestion, but no frank edema.  CTA chest: No evidence of pulmonary embolism. 2. Trace right pleural effusion. 3. Mild mosaic attenuation both lung fields, which can be seen in the setting of small airways disease. 4. Ascending thoracic aortic aneurysm measuring 4.1 cm in diameter. Recommend annual imaging followup by CTA or MRA. This recommendation follows 2010 ACCF/AHA/AATS/ACR/ASA/SCA/SCAI/SIR/STS/SVM Guidelines for the Diagnosis and Management of Patients with Thoracic Aortic Disease. Circulation. 2010; 121: W119-J478. Aortic aneurysm NOS (ICD10-I71.9) 5. Cardiomegaly with reflux of contrast into the IVC and hepatic veins, suggestive of right heart dysfunction. 6. Dilated main pulmonary trunk, which can be seen in the setting of pulmonary arterial hypertension. 7. Aortic atherosclerosis Ct thoracic spine: no acute finding. Stable endplate changes at T8-9 without interval erosion to strongly suggest presence of active discitis/OM.  In ED: given 40mg  IV lasix, pain medication and TRH asked to admit.    Review of Systems: As mentioned in the history of present illness. All other systems reviewed and are negative. Past Medical History:  Diagnosis Date   Arthritis    Cerebral aneurysm    x2   Depression    Diabetes mellitus    Hypertension    Past Surgical History:  Procedure Laterality Date   BRAIN SURGERY  double aneurysm   IR THORACIC DISC ASPIRATION W/IMG GUIDE  09/14/2022   IR THORACIC DISC ASPIRATION W/IMG GUIDE  09/16/2022   RADIOLOGY WITH ANESTHESIA N/A 09/16/2022   Procedure: IR thoracic disc aspiration;  Surgeon: Baldemar Lenis, MD;  Location: [facility] OR;  Service: Radiology;  Laterality: N/A;   Social History:  reports that she has quit smoking. Her smoking use included  cigarettes. She has never used smokeless tobacco. She reports that she does not drink alcohol and does not use drugs.  No Known Allergies  Family History  Family history unknown: Yes    Prior to Admission medications   Medication Sig Start Date End Date Taking? Authorizing Provider  albuterol (VENTOLIN HFA) 108 (90 Base) MCG/ACT inhaler Inhale 1-2 puffs into the lungs as needed for wheezing or shortness of breath. 03/18/22   [provider]  amiodarone (PACERONE) 200 MG tablet Take 1 tablet (200 mg total) by mouth daily. 10/12/22   Fenton, Clint R, PA  apixaban (ELIQUIS) 5 MG TABS tablet Take 1 tablet (5 mg total) by mouth 2 (two) times daily. 10/06/22   Andrey Farmer, PA-C  atorvastatin (LIPITOR) 20 MG tablet Take 1 tablet (20 mg total) by mouth daily. 10/06/22   Andrey Farmer, PA-C  cholecalciferol (VITAMIN D) 1000 units tablet Take 2,000 Units by mouth daily. Gummies    [provider]  furosemide (LASIX) 80 MG tablet Take 1 tablet (80 mg total) by mouth 2 (two) times daily. 10/06/22   Andrey Farmer, PA-C  gabapentin (NEURONTIN) 300 MG capsule Take 300 mg by mouth at bedtime.    [provider]  JANUVIA 25 MG tablet Take 25 mg by mouth daily. 06/23/22   [provider]  losartan (COZAAR) 25 MG tablet Take 1 tablet (25 mg total) by mouth daily. 10/06/22   Andrey Farmer, PA-C  MAGNESIUM CITRATE PO Take 250 mg by mouth daily.    [provider]  metFORMIN (GLUCOPHAGE) 1000 MG tablet Take 1,000 mg by mouth 2 (two) times daily with a meal.    [provider]  metoprolol succinate (TOPROL-XL) 100 MG 24 hr tablet Take 1 tablet (100 mg total) by mouth daily. Take with or immediately following a meal. 10/06/22 11/05/22  Andrey Farmer, PA-C  OVER THE COUNTER MEDICATION Take 1 capsule by mouth daily. Omega XL    [provider]  potassium chloride (KLOR-CON M) 10 MEQ tablet Take 2 tablets (20 mEq total) by  mouth daily. Patient not taking: Reported on 10/12/2022 10/06/22   Andrey Farmer, PA-C  SPIRIVA HANDIHALER 18 MCG inhalation capsule Place 18 mcg into inhaler and inhale daily. Patient not taking: Reported on 10/12/2022 06/23/22   [provider]    Physical Exam: Vitals:   11/27/22 0731 11/27/22 0731 11/27/22 0849 11/27/22 1114  BP: (!) 177/94  (!) 143/96 (!) 149/90  Pulse:  (!) 54 100 (!) 50  Resp:  20 (!) 24 18  Temp:  98.5 F (36.9 C)  98.6 F (37 C)  TempSrc:  Oral  Oral  SpO2:  100% 100% 100%   General:  Appears calm and comfortable and is in NAD. Obese  Eyes:  PERRL, EOMI, normal lids, iris ENT:  grossly normal hearing, lips & tongue, mmm; appropriate dentition Neck:  no LAD, masses or thyromegaly; no carotid bruits Cardiovascular:  RRR, +systolic murmur. 1+ bilateral  LE edema.  Respiratory:   decreased breath sounds in RLL, otherwise normal exam  Abdomen:  soft, NT, ND, NABS Back:   normal alignment, no CVAT Skin:  no rash or induration seen on limited exam Musculoskeletal:  grossly normal tone BUE/BLE, good ROM, no bony abnormality. She has no TTP over thoracic spine where she is hurting. UE strength intact  Lower extremity:   Limited foot exam with no ulcerations.  2+ distal pulses. Psychiatric:  grossly normal mood and affect, speech fluent and appropriate, AOx3 Neurologic:  CN 2-12 grossly intact, moves all extremities in coordinated fashion, sensation intact   Radiological Exams on Admission: Independently reviewed - see discussion in A/P where applicable  CT Angio Chest Pulmonary Embolism (PE) W or WO Contrast  Result Date: 11/27/2022 CLINICAL DATA:  Pulmonary embolism (PE) suspected, high prob EXAM: CT ANGIOGRAPHY CHEST WITH CONTRAST TECHNIQUE: Multidetector CT imaging of the chest was performed using the standard protocol during bolus administration of intravenous contrast. Multiplanar CT image reconstructions and MIPs were obtained to evaluate the  vascular anatomy. RADIATION DOSE REDUCTION: This exam was performed according to the departmental dose-optimization program which includes automated exposure control, adjustment of the mA and/or kV according to patient size and/or use of iterative reconstruction technique. CONTRAST:  75mL OMNIPAQUE IOHEXOL 350 MG/ML SOLN COMPARISON:  CT 09/08/2022 FINDINGS: Cardiovascular: Satisfactory opacification of the pulmonary arteries. No pulmonary arterial filling defect to the segmental branch level to suggest pulmonary embolism. Main pulmonary trunk is dilated at 4.3 cm. Mid ascending thoracic aorta measures 4.1 cm in diameter, unchanged from prior. Scattered atherosclerotic vascular calcifications of the aorta and coronary arteries. Cardiomegaly. No pericardial effusion. Mediastinum/Nodes: No enlarged mediastinal, hilar, or axillary lymph nodes. Thyroid gland, trachea, and esophagus demonstrate no significant findings. Lungs/Pleura: Trace right pleural effusion. Mild mosaic attenuation both lung fields. No airspace consolidation. No pneumothorax. Upper Abdomen: Reflux of contrast into the IVC and hepatic veins. No acute abnormality. Musculoskeletal: Degenerative disc disease with endplate irregularity at T8-9 is unchanged in appearance compared to the prior CT without progressive endplate erode Review of the MIP images confirms the above findings. IMPRESSION: 1. No evidence of pulmonary embolism. 2. Trace right pleural effusion. 3. Mild mosaic attenuation both lung fields, which can be seen in the setting of small airways disease. 4. Ascending thoracic aortic aneurysm measuring 4.1 cm in diameter. Recommend annual imaging followup by CTA or MRA. This recommendation follows 2010 ACCF/AHA/AATS/ACR/ASA/SCA/SCAI/SIR/STS/SVM Guidelines for the Diagnosis and Management of Patients with Thoracic Aortic Disease. Circulation. 2010; 121: Z610-R604. Aortic aneurysm NOS (ICD10-I71.9) 5. Cardiomegaly with reflux of contrast into the  IVC and hepatic veins, suggestive of right heart dysfunction. 6. Dilated main pulmonary trunk, which can be seen in the setting of pulmonary arterial hypertension. 7. Aortic atherosclerosis (ICD10-I70.0). Electronically Signed   By: Duanne Guess D.O.   On: 11/27/2022 11:26   CT T-SPINE NO CHARGE  Result Date: 11/27/2022 CLINICAL DATA:  Shortness of breath and back pain. Previous abnormal imaging appearance of T8-9 with disc aspiration on 09/16/2022 being negative for infection. EXAM: CT THORACIC SPINE WITH CONTRAST TECHNIQUE: Multiplanar CT images of the thoracic spine were reconstructed from contemporary CT of the Chest. RADIATION DOSE REDUCTION: This exam was performed according to the departmental dose-optimization program which includes automated exposure control, adjustment of the mA and/or kV according to patient size and/or use of iterative reconstruction technique. CONTRAST:  No additional. COMPARISON:  Thoracic spine CT 09/20/2022 FINDINGS: Alignment: Mild upper thoracic levoscoliosis. No significant listhesis. Vertebrae: Prominent endplate irregularity and sclerosis at T8-9 is unchanged from the prior CT without evidence of interval bone erosion.  No acute fracture. Paraspinal and other soft tissues: Unchanged mild paravertebral soft tissue thickening at T8-9. No paraspinal fluid collection. Remainder of the chest reported separately. Disc levels: Diffuse thoracic disc degeneration. Prominent left-sided facet spurring in the upper thoracic spine with mild spinal stenosis at T5-6. No evidence of high-grade spinal stenosis. IMPRESSION: No acute osseous abnormality. 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 Electronically Signed   By: Sebastian Ache M.D.   On: 11/27/2022 11:13   DG Chest Port 1 View  Result Date: 11/27/2022 CLINICAL DATA:  67 year old female with history of shortness of breath and chest pain. Back  pain. EXAM: PORTABLE CHEST 1 VIEW COMPARISON:  Chest x-ray 09/09/2022. FINDINGS: Lung volumes are low. No confluent consolidative airspace disease. No pleural effusions. No pneumothorax. Cephalization of the pulmonary vasculature, without frank pulmonary edema. Heart size is mildly enlarged. Upper mediastinal contours are within normal limits. IMPRESSION: 1. Cardiomegaly with pulmonary venous congestion, but no frank pulmonary edema. Electronically Signed   By: Trudie Reed M.D.   On: 11/27/2022 06:40    EKG: Independently reviewed.  NSR with rate 61; nonspecific ST changes with no evidence of acute ischemia PVC, borderline prolonged qt   Labs on Admission: I have personally reviewed the available labs and imaging studies at the time of the admission.  Pertinent labs:   Bnp: 213  Assessment and Plan: Principal Problem:   Acute on chronic right heart failure with acute respiratory failure with hypoxia Active Problems:   Thoracic back pain   Persistent atrial fibrillation (HCC)   OSA/OHS   Coronary artery disease involving native coronary artery of native heart without angina pectoris   Diabetes mellitus (HCC)   COPD (chronic obstructive pulmonary disease) (HCC)   Obesity hypoventilation syndrome (HCC)   Acute respiratory failure with hypoxia (HCC)    Assessment and Plan: * Acute on chronic right heart failure with acute respiratory failure with hypoxia 67 year old female with known cor pulmonale and HFpEF presenting with back pain and worsening dyspnea on exertion found to be hypoxic on room air to 82% with findings on CXR of pulmonary venous congestion/right pleural effusion with increased leg swelling, elevated BNP, untreated osa/ohs consistent with acute on chronic right heart failure -obs to telel  -given 40mg  of IV lasix in ED, continue 40mg  IV BID -was discharged on 80mg  BID, but unsure of compliance with this. She thinks she has missed some doses or takes every other day. Will  start with the 40mg  BID and titrate accordingly  -echo in 08/2022: EF of 50% with mildly decreased LVF. Mildly reduced RVF. Moderate to severe tricuspid regurg -continue metoprolol, reduced to 50mg  due to bradycardia  -on ARB, considering entresto at CHF clinic, but never followed back up as scheduled  -not a candidate for  SGLT2i due to morbid obesity/risk of GU infections  -strict I/O and daily weights. D/c weight from hospital was 285#. Has not been weighed yet today.  -has not had her sleep study so continues to have untreated OSA/OHS driving repeat exacerbations.  -CTA negative for PE  -start back bipap at night. She states she does not wear oxygen during the day at home.  -wean oxygen as tolerated with diuresis  -consult cards if no improvement   Thoracic back pain When admitted in August 2024 there was initial concern of possible discitis/OM; however, MRI could not be obtained due to aneurysm clipping and inability to get records.  - Blood  cultures were negative, aspirate of the disc space negative for growth at that time.  -she had follow up with ID on 9/24 with labs and low suspicion for infection. She missed her f/u appointment in October -she has had no fever/chills and on precipitating events, but felt the back pain return this week which prompted ED visit.  -she has no TTP on exam, erythema, leukocytosis or SIRS criteria -repeat CT of spine with no acute changes/findings -inflammatory markers pending  -she has signed ROI for records from hospital in [place] this week. Hopefully we can get records about her clipping and if MRI compatible and can scan her if able while she is here. [facility]) -did discuss unsure if her CHF exacerbation/effusion could also be contributing since coincides again with CHF exacerbation  -pain control with heating pad, lidocaine patch, norco and severe pain morphine  -robaxin PRN -weight loss/posture and core strengthening will also be  beneficial      Persistent atrial fibrillation (HCC) CHA2DS2-VASc score is 6  NSR/rate controlled today  Saw atrial fib clinic on 10/1 where cardioversion was going to be scheduled, but she had missed an eliquis dose.  Was supposed to follow back up, but has  not happened yet Continue amiodarone and eliquis  Decrease metoprolol to 50mg  as heart rate in mid to upper 40s.   OSA/OHS Has not been on a cpap or bipap. She was supposed to be discharged on bipap at last hospitalization Will order bipap at night while here.  Weight loss will be life saving  Needs sleep referral so she can get bipap at home; however, she was not very compliant with this during her nights on last hospitalization    Coronary artery disease involving native coronary artery of native heart without angina pectoris Stable with no complaints of chest pain  Continue lipitor/eliquis.  No longer on beta blocker due to hypotension   Diabetes mellitus (HCC) A1c of 7.8 in August 2024 Continue januvia, hold metformin  SSI and accuchecks qac/hs    COPD (chronic obstructive pulmonary disease) (HCC) No signs of exacerbation Continue spiriva and SABA prn    Advance Care Planning:   Code Status: Full Code  Consults: none   DVT Prophylaxis: eliquis   Family Communication: none   Severity of Illness: The appropriate patient status for this patient is OBSERVATION. Observation status is judged to be reasonable and necessary in order to provide the required intensity of service to ensure the patient's safety. The patient's presenting symptoms, physical exam findings, and initial radiographic and laboratory data in the context of their medical condition is felt to place them at decreased risk for further clinical deterioration. Furthermore, it is anticipated that the patient will be medically stable for discharge from the hospital within 2 midnights of admission.   Author: Orland Mustard, MD 11/27/2022 1:57 PM  For on  call review [email or web address].

## 2022-11-27 NOTE — ED Notes (Signed)
RN assisted pt back into bed.  Pt requesting pain medication. Pt says oxycodone works. RN notified EDP

## 2022-11-27 NOTE — ED Provider Notes (Signed)
Seen after prior ED provider.  Patient with benefit from admission.  Hospitalist service made aware of case and will evaluate for same.   Wynetta Fines, MD 11/27/22 [phone]

## 2022-11-27 NOTE — ED Notes (Signed)
RN assisted pt to side of bed and provided a BSC

## 2022-11-27 NOTE — Assessment & Plan Note (Signed)
No signs of exacerbation Continue spiriva and SABA prn

## 2022-11-27 NOTE — ED Notes (Signed)
RN informed CT pt has an IV

## 2022-11-27 NOTE — Assessment & Plan Note (Addendum)
Has not been on a cpap or bipap. She was supposed to be discharged on bipap at last hospitalization Will order bipap at night while here.  Weight loss will be life saving  Needs sleep referral so she can get bipap at home; however, she was not very compliant with this during her nights on last hospitalization

## 2022-11-27 NOTE — ED Notes (Signed)
Pt placed in recliner for comfort

## 2022-11-27 NOTE — ED Notes (Signed)
ED TO INPATIENT HANDOFF REPORT  ED Nurse Name and Phone #: Delice Bison, RN  S Name/Age/Gender Linda Chase 67 y.o. female Room/Bed: [phone]  Code Status   Code Status: Full Code  Home/SNF/Other Home Patient oriented to: self, place, time, and situation Is this baseline? Yes   Triage Complete: Triage complete  Chief Complaint Acute on chronic right heart failure (HCC) [I50.813]  Triage Note Pt presents via EMS c/o mid thoracic back pain and SOB. EMS reports saturation in low 80s upon EMS arrival. Pt A&O x4 currently. Reports hx of CHF, reports missed dose of Lasix yesterday,    Allergies No Known Allergies  Level of Care/Admitting Diagnosis ED Disposition     ED Disposition  Admit   Condition  --   Comment  Hospital Area: [facility] [[id number]]  Level of Care: Telemetry Medical [104]  May place patient in observation at [facility] or [facility] if equivalent level of care is available:: No  Covid Evaluation: Asymptomatic - no recent exposure (last 10 days) testing not required  Diagnosis: Acute on chronic right heart failure [facility]) [[id number]]  Admitting Physician: Orland Mustard [[id number]]  Attending Physician: Alton Revere          B Medical/Surgery History Past Medical History:  Diagnosis Date   Arthritis    Cerebral aneurysm    x2   Depression    Diabetes mellitus    Hypertension    Past Surgical History:  Procedure Laterality Date   BRAIN SURGERY     double aneurysm   IR THORACIC DISC ASPIRATION W/IMG GUIDE  09/14/2022   IR THORACIC DISC ASPIRATION W/IMG GUIDE  09/16/2022   RADIOLOGY WITH ANESTHESIA N/A 09/16/2022   Procedure: IR thoracic disc aspiration;  Surgeon: Baldemar Lenis, MD;  Location: [facility] OR;  Service: Radiology;  Laterality: N/A;     A IV Location/Drains/Wounds Patient Lines/Drains/Airways Status     Active Line/Drains/Airways     Name Placement date Placement time Site Days   Peripheral IV  11/27/22 20 G 2.5" Anterior;Proximal;Right Forearm 11/27/22  0838  Forearm  less than 1            Intake/Output Last 24 hours  Intake/Output Summary (Last 24 hours) at 11/27/2022 1457 Last data filed at 11/27/2022 1411 Gross per 24 hour  Intake --  Output 900 ml  Net -900 ml    Labs/Imaging Results for orders placed or performed during the hospital encounter of 11/27/22 (from the past 48 hour(s))  CBC     Status: Abnormal   Collection Time: 11/27/22  6:33 AM  Result Value Ref Range   WBC 8.8 4.0 - 10.5 K/uL   RBC 4.98 3.87 - 5.11 MIL/uL   Hemoglobin 12.5 12.0 - 15.0 g/dL   HCT 84.1 32.4 - 40.1 %   MCV 86.1 80.0 - 100.0 fL   MCH 25.1 (L) 26.0 - 34.0 pg   MCHC 29.1 (L) 30.0 - 36.0 g/dL   RDW 02.7 (H) 25.3 - 66.4 %   Platelets 212 150 - 400 K/uL   nRBC 0.0 0.0 - 0.2 %    Comment: Performed at [facility], [address] [place], [place] [address]  Comprehensive metabolic panel     Status: Abnormal   Collection Time: 11/27/22  6:33 AM  Result Value Ref Range   Sodium 140 135 - 145 mmol/L   Potassium 4.6 3.5 - 5.1 mmol/L   Chloride 104 98 - 111 mmol/L  CO2 28 22 - 32 mmol/L   Glucose, Bld 121 (H) 70 - 99 mg/dL    Comment: Glucose reference range applies only to samples taken after fasting for at least 8 hours.   BUN 16 8 - 23 mg/dL   Creatinine, Ser 1.61 (H) 0.44 - 1.00 mg/dL   Calcium 9.1 8.9 - 09.6 mg/dL   Total Protein 7.7 6.5 - 8.1 g/dL   Albumin 3.4 (L) 3.5 - 5.0 g/dL   AST 21 15 - 41 U/L   ALT 16 0 - 44 U/L   Alkaline Phosphatase 60 38 - 126 U/L   Total Bilirubin 0.7 <1.2 mg/dL   GFR, Estimated 53 (L) >60 mL/min    Comment: (NOTE) Calculated using the CKD-EPI Creatinine Equation (2021)    Anion gap 8 5 - 15    Comment: Performed at [facility], [address] [place], [place] [address]  Troponin I (High Sensitivity)     Status: None   Collection Time: 11/27/22  6:33 AM  Result Value Ref Range   Troponin I (High Sensitivity) 9 <18 ng/L     Comment: (NOTE) Elevated high sensitivity troponin I (hsTnI) values and significant  changes across serial measurements may suggest ACS but many other  chronic and acute conditions are known to elevate hsTnI results.  Refer to the "Links" section for chest pain algorithms and additional  guidance. Performed at [facility], [address], [place], [place] [address]   Brain natriuretic peptide     Status: Abnormal   Collection Time: 11/27/22  6:33 AM  Result Value Ref Range   B Natriuretic Peptide 213.7 (H) 0.0 - 100.0 pg/mL    Comment: Performed at [facility], [address] [place], [place] [address]  Troponin I (High Sensitivity)     Status: None   Collection Time: 11/27/22  8:42 AM  Result Value Ref Range   Troponin I (High Sensitivity) 8 <18 ng/L    Comment: (NOTE) Elevated high sensitivity troponin I (hsTnI) values and significant  changes across serial measurements may suggest ACS but many other  chronic and acute conditions are known to elevate hsTnI results.  Refer to the "Links" section for chest pain algorithms and additional  guidance. Performed at [facility], [address], [place], [place] [address]    CT Angio Chest Pulmonary Embolism (PE) W or WO Contrast  Result Date: 11/27/2022 CLINICAL DATA:  Pulmonary embolism (PE) suspected, high prob EXAM: CT ANGIOGRAPHY CHEST WITH CONTRAST TECHNIQUE: Multidetector CT imaging of the chest was performed using the standard protocol during bolus administration of intravenous contrast. Multiplanar CT image reconstructions and MIPs were obtained to evaluate the vascular anatomy. RADIATION DOSE REDUCTION: This exam was performed according to the departmental dose-optimization program which includes automated exposure control, adjustment of the mA and/or kV according to patient size and/or use of iterative reconstruction technique. CONTRAST:  75mL OMNIPAQUE IOHEXOL 350 MG/ML SOLN COMPARISON:  CT 09/08/2022  FINDINGS: Cardiovascular: Satisfactory opacification of the pulmonary arteries. No pulmonary arterial filling defect to the segmental branch level to suggest pulmonary embolism. Main pulmonary trunk is dilated at 4.3 cm. Mid ascending thoracic aorta measures 4.1 cm in diameter, unchanged from prior. Scattered atherosclerotic vascular calcifications of the aorta and coronary arteries. Cardiomegaly. No pericardial effusion. Mediastinum/Nodes: No enlarged mediastinal, hilar, or axillary lymph nodes. Thyroid gland, trachea, and esophagus demonstrate no significant findings. Lungs/Pleura: Trace right pleural effusion. Mild mosaic attenuation both lung fields. No airspace  consolidation. No pneumothorax. Upper Abdomen: Reflux of contrast into the IVC and hepatic veins. No acute abnormality. Musculoskeletal: Degenerative disc disease with endplate irregularity at T8-9 is unchanged in appearance compared to the prior CT without progressive endplate erode Review of the MIP images confirms the above findings. IMPRESSION: 1. No evidence of pulmonary embolism. 2. Trace right pleural effusion. 3. Mild mosaic attenuation both lung fields, which can be seen in the setting of small airways disease. 4. Ascending thoracic aortic aneurysm measuring 4.1 cm in diameter. Recommend annual imaging followup by CTA or MRA. This recommendation follows 2010 ACCF/AHA/AATS/ACR/ASA/SCA/SCAI/SIR/STS/SVM Guidelines for the Diagnosis and Management of Patients with Thoracic Aortic Disease. Circulation. 2010; 121: V956-L875. Aortic aneurysm NOS (ICD10-I71.9) 5. Cardiomegaly with reflux of contrast into the IVC and hepatic veins, suggestive of right heart dysfunction. 6. Dilated main pulmonary trunk, which can be seen in the setting of pulmonary arterial hypertension. 7. Aortic atherosclerosis (ICD10-I70.0). Electronically Signed   By: Duanne Guess D.O.   On: 11/27/2022 11:26   CT T-SPINE NO CHARGE  Result Date: 11/27/2022 CLINICAL DATA:   Shortness of breath and back pain. Previous abnormal imaging appearance of T8-9 with disc aspiration on 09/16/2022 being negative for infection. EXAM: CT THORACIC SPINE WITH CONTRAST TECHNIQUE: Multiplanar CT images of the thoracic spine were reconstructed from contemporary CT of the Chest. RADIATION DOSE REDUCTION: This exam was performed according to the departmental dose-optimization program which includes automated exposure control, adjustment of the mA and/or kV according to patient size and/or use of iterative reconstruction technique. CONTRAST:  No additional. COMPARISON:  Thoracic spine CT 09/20/2022 FINDINGS: Alignment: Mild upper thoracic levoscoliosis. No significant listhesis. Vertebrae: Prominent endplate irregularity and sclerosis at T8-9 is unchanged from the prior CT without evidence of interval bone erosion. No acute fracture. Paraspinal and other soft tissues: Unchanged mild paravertebral soft tissue thickening at T8-9. No paraspinal fluid collection. Remainder of the chest reported separately. Disc levels: Diffuse thoracic disc degeneration. Prominent left-sided facet spurring in the upper thoracic spine with mild spinal stenosis at T5-6. No evidence of high-grade spinal stenosis. IMPRESSION: No acute osseous abnormality. 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 Electronically Signed   By: Sebastian Ache M.D.   On: 11/27/2022 11:13   DG Chest Port 1 View  Result Date: 11/27/2022 CLINICAL DATA:  67 year old female with history of shortness of breath and chest pain. Back pain. EXAM: PORTABLE CHEST 1 VIEW COMPARISON:  Chest x-ray 09/09/2022. FINDINGS: Lung volumes are low. No confluent consolidative airspace disease. No pleural effusions. No pneumothorax. Cephalization of the pulmonary vasculature, without frank pulmonary edema. Heart size is mildly enlarged. Upper mediastinal contours are within normal limits.  IMPRESSION: 1. Cardiomegaly with pulmonary venous congestion, but no frank pulmonary edema. Electronically Signed   By: Trudie Reed M.D.   On: 11/27/2022 06:40    Pending Labs Unresulted Labs (From admission, onward)     Start     Ordered   11/28/22 0500  Basic metabolic panel  Tomorrow morning,   R        11/27/22 1329   11/28/22 0500  CBC  Tomorrow morning,   R        11/27/22 1329   11/27/22 1331  Blood gas, venous  Once,   R        11/27/22 1330   11/27/22 1311  C-reactive protein  Once,   R        11/27/22 1310   11/27/22  1311  Sedimentation rate  Once,   R        11/27/22 1310   11/27/22 1235  Magnesium  Once,   STAT        11/27/22 1234            Vitals/Pain Today's Vitals   11/27/22 0849 11/27/22 1016 11/27/22 1114 11/27/22 1444  BP: (!) 143/96  (!) 149/90 137/82  Pulse: 100  (!) 50 (!) 51  Resp: (!) 24  18   Temp:   98.6 F (37 C)   TempSrc:   Oral   SpO2: 100%  100%   PainSc:  8       Isolation Precautions No active isolations  Medications Medications  insulin aspart (novoLOG) injection 0-15 Units (has no administration in time range)  furosemide (LASIX) injection 40 mg (has no administration in time range)  acetaminophen (TYLENOL) tablet 650 mg (has no administration in time range)    Or  acetaminophen (TYLENOL) suppository 650 mg (has no administration in time range)  HYDROcodone-acetaminophen (NORCO/VICODIN) 5-325 MG per tablet 1-2 tablet (has no administration in time range)  morphine (PF) 2 MG/ML injection 2 mg (has no administration in time range)  methocarbamol (ROBAXIN) injection 500 mg (has no administration in time range)  lidocaine (LIDODERM) 5 % 1 patch (1 patch Transdermal Patch Applied 11/27/22 1446)  amiodarone (PACERONE) tablet 200 mg (has no administration in time range)  atorvastatin (LIPITOR) tablet 20 mg (20 mg Oral Given 11/27/22 1445)  losartan (COZAAR) tablet 25 mg (25 mg Oral Given 11/27/22 1441)  metoprolol succinate  (TOPROL-XL) 24 hr tablet 50 mg (50 mg Oral Not Given 11/27/22 1444)  linagliptin (TRADJENTA) tablet 5 mg (5 mg Oral Given 11/27/22 1442)  apixaban (ELIQUIS) tablet 5 mg (5 mg Oral Given 11/27/22 1442)  gabapentin (NEURONTIN) capsule 300 mg (has no administration in time range)  albuterol (PROVENTIL) (2.5 MG/3ML) 0.083% nebulizer solution 2.5 mg (has no administration in time range)  umeclidinium bromide (INCRUSE ELLIPTA) 62.5 MCG/ACT 1 puff (has no administration in time range)  HYDROcodone-acetaminophen (NORCO/VICODIN) 5-325 MG per tablet 1 tablet (1 tablet Oral Given 11/27/22 0736)  furosemide (LASIX) injection 40 mg (40 mg Intravenous Given 11/27/22 0944)  iohexol (OMNIPAQUE) 350 MG/ML injection 75 mL (75 mLs Intravenous Contrast Given 11/27/22 1050)  morphine (PF) 2 MG/ML injection 2 mg (2 mg Intravenous Given 11/27/22 1214)    Mobility walks with device     Focused Assessments Pulmonary Assessment Handoff:  Lung sounds: Bilateral Breath Sounds: Diminished O2 Device: Nasal Cannula O2 Flow Rate (L/min): 2 L/min    R Recommendations: See Admitting Provider Note  Report given to:   Additional Notes:

## 2022-11-27 NOTE — ED Notes (Signed)
RN assisted pt to [facility].  Pt was changed into hospital attire. Personal belongings bagged with label.

## 2022-11-27 NOTE — ED Notes (Signed)
IV team at bedside to place [place] IV.

## 2022-11-28 DIAGNOSIS — I4819 Other persistent atrial fibrillation: Secondary | ICD-10-CM | POA: Diagnosis present

## 2022-11-28 DIAGNOSIS — I5033 Acute on chronic diastolic (congestive) heart failure: Secondary | ICD-10-CM | POA: Diagnosis present

## 2022-11-28 DIAGNOSIS — I50813 Acute on chronic right heart failure: Secondary | ICD-10-CM | POA: Diagnosis not present

## 2022-11-28 DIAGNOSIS — M546 Pain in thoracic spine: Secondary | ICD-10-CM | POA: Diagnosis present

## 2022-11-28 DIAGNOSIS — Z7984 Long term (current) use of oral hypoglycemic drugs: Secondary | ICD-10-CM | POA: Diagnosis not present

## 2022-11-28 DIAGNOSIS — I071 Rheumatic tricuspid insufficiency: Secondary | ICD-10-CM | POA: Diagnosis present

## 2022-11-28 DIAGNOSIS — Z79899 Other long term (current) drug therapy: Secondary | ICD-10-CM | POA: Diagnosis not present

## 2022-11-28 DIAGNOSIS — Z6841 Body Mass Index (BMI) 40.0 and over, adult: Secondary | ICD-10-CM | POA: Diagnosis not present

## 2022-11-28 DIAGNOSIS — Z87891 Personal history of nicotine dependence: Secondary | ICD-10-CM | POA: Diagnosis not present

## 2022-11-28 DIAGNOSIS — I2510 Atherosclerotic heart disease of native coronary artery without angina pectoris: Secondary | ICD-10-CM | POA: Diagnosis present

## 2022-11-28 DIAGNOSIS — I110 Hypertensive heart disease with heart failure: Secondary | ICD-10-CM | POA: Diagnosis present

## 2022-11-28 DIAGNOSIS — Z7901 Long term (current) use of anticoagulants: Secondary | ICD-10-CM | POA: Diagnosis not present

## 2022-11-28 DIAGNOSIS — R0602 Shortness of breath: Secondary | ICD-10-CM | POA: Diagnosis present

## 2022-11-28 DIAGNOSIS — J449 Chronic obstructive pulmonary disease, unspecified: Secondary | ICD-10-CM | POA: Diagnosis present

## 2022-11-28 DIAGNOSIS — Z555 Less than a high school diploma: Secondary | ICD-10-CM | POA: Diagnosis not present

## 2022-11-28 DIAGNOSIS — I509 Heart failure, unspecified: Secondary | ICD-10-CM

## 2022-11-28 DIAGNOSIS — E119 Type 2 diabetes mellitus without complications: Secondary | ICD-10-CM | POA: Diagnosis present

## 2022-11-28 DIAGNOSIS — E662 Morbid (severe) obesity with alveolar hypoventilation: Secondary | ICD-10-CM | POA: Diagnosis present

## 2022-11-28 DIAGNOSIS — I5082 Biventricular heart failure: Secondary | ICD-10-CM | POA: Diagnosis present

## 2022-11-28 DIAGNOSIS — J9601 Acute respiratory failure with hypoxia: Secondary | ICD-10-CM | POA: Diagnosis present

## 2022-11-28 DIAGNOSIS — I2729 Other secondary pulmonary hypertension: Secondary | ICD-10-CM | POA: Diagnosis present

## 2022-11-28 LAB — CBC
HCT: 39.3 % (ref 36.0–46.0)
Hemoglobin: 11.4 g/dL — ABNORMAL LOW (ref 12.0–15.0)
MCH: 24.9 pg — ABNORMAL LOW (ref 26.0–34.0)
MCHC: 29 g/dL — ABNORMAL LOW (ref 30.0–36.0)
MCV: 86 fL (ref 80.0–100.0)
Platelets: 222 10*3/uL (ref 150–400)
RBC: 4.57 MIL/uL (ref 3.87–5.11)
RDW: 16.8 % — ABNORMAL HIGH (ref 11.5–15.5)
WBC: 9.3 10*3/uL (ref 4.0–10.5)
nRBC: 0 % (ref 0.0–0.2)

## 2022-11-28 LAB — BASIC METABOLIC PANEL
Anion gap: 10 (ref 5–15)
BUN: 13 mg/dL (ref 8–23)
CO2: 28 mmol/L (ref 22–32)
Calcium: 8.8 mg/dL — ABNORMAL LOW (ref 8.9–10.3)
Chloride: 101 mmol/L (ref 98–111)
Creatinine, Ser: 1 mg/dL (ref 0.44–1.00)
GFR, Estimated: >60 mL/min (ref >60)
Glucose, Bld: 141 mg/dL — ABNORMAL HIGH (ref 70–99)
Potassium: 3.7 mmol/L (ref 3.5–5.1)
Sodium: 139 mmol/L (ref 135–145)

## 2022-11-28 LAB — GLUCOSE, CAPILLARY
Glucose-Capillary: 129 mg/dL — ABNORMAL HIGH (ref 70–99)
Glucose-Capillary: 185 mg/dL — ABNORMAL HIGH (ref 70–99)
Glucose-Capillary: 119 mg/dL — ABNORMAL HIGH (ref 70–99)
Glucose-Capillary: 133 mg/dL — ABNORMAL HIGH (ref 70–99)

## 2022-11-28 MED ORDER — METHOCARBAMOL 500 MG PO TABS
500.0000 mg | ORAL_TABLET | Freq: Three times a day (TID) | ORAL | Status: DC
  Administered 2022-11-28 – 2022-11-30 (×7): 500 mg via ORAL
  Filled 2022-11-28 (×7): qty 1

## 2022-11-28 MED ORDER — SENNOSIDES-DOCUSATE SODIUM 8.6-50 MG PO TABS
1.0000 | ORAL_TABLET | Freq: Two times a day (BID) | ORAL | Status: DC
  Administered 2022-11-28 – 2022-11-30 (×5): 1 via ORAL
  Filled 2022-11-28 (×5): qty 1

## 2022-11-28 NOTE — Progress Notes (Signed)
Mobility Specialist Progress Note:   11/28/22 0905  Mobility  Activity Ambulated with assistance in hallway  Level of Assistance Minimal assist, patient does 75% or more  Assistive Device Front wheel walker  Distance Ambulated (ft) 250 ft  Activity Response Tolerated well  Mobility Referral Yes  $Mobility charge 1 Mobility  Mobility Specialist Start Time (ACUTE ONLY) [id number]  Mobility Specialist Stop Time (ACUTE ONLY) 0920  Mobility Specialist Time Calculation (min) (ACUTE ONLY) 15 min   Pt agreeable to mobility session. Required minA to stand, minG to ambulate with RW. C/o R knee pain, no other complaints. Pt back in chair with all needs met.   Addison Lank Mobility Specialist Please contact via SecureChat or  Rehab office at [phone]

## 2022-11-28 NOTE — Plan of Care (Signed)

## 2022-11-28 NOTE — Progress Notes (Addendum)
PROGRESS NOTE    Linda Chase  MWU:[id number] DOB: September 26, 1955 DOA: 11/27/2022 PCP: Rometta Emery, MD  Linda Chase is an obese 67/F with history of diastolic CHF, right heart failure, COPD, untreated OSA/OHS, history of cerebral aneurysm status postcraniotomy and clipping, type 2 diabetes mellitus, PAF not on anticoagulation, hypertension, morbid obesity presented to the hospital with shortness of breath and low back pain.  History of intermittent low back pain for months worse with activity and movement.  In addition also reports shortness of breath with activity, poor compliance with diuretics in the setting of her back pain and problems urinating too much on Lasix especially with her back problems.  Admitted in 8/24 for right heart failure and respiratory failure, discharged on diuretics. -In the ED hypertensive, O2 sats 82% on RA, chest x-ray with cardiomegaly, pulmonary vascular congestion, CTA chest with no PE, trace right pleural effusion, mild mosaic attenuation in both lungs can be seen in the setting of small airway disease and thoracic aortic aneurysm 4.1 cm, cardiomegaly with reflux of contrast in the IVC and hepatic veins suggestive of RV dysfunction   Subjective: -Breathing is improving -Complains of upper back pain, asking for oxycodone  Assessment and Plan:  Acute on chronic diastolic CHF Pulmonary hypertension, RV failure -Last echo 8/24 with EF 50%, mild LVH, mild RV failure, Mod  to severe tricuspid regurg -Untreated OSA OHS contributing -Continue IV Lasix today -Continue ARB, metoprolol -Candidate for SGLT2i with morbid obesity and limited mobility -Emphasized BiPAP nightly, needs sleep study as outpatient  Chronic thoracic back pain When admitted in August 2024 there was initial concern of possible discitis/OM; however, MRI could not be obtained due to aneurysm clipping and inability to get records.  - Blood cultures were negative, aspirate of the disc space  negative for growth at that time.  -she had follow up with ID on 9/24 with labs and low suspicion for infection -CT thoracic spine few months ago and CTA chest now suggest DJD -Lidocaine patch, add scheduled Robaxin, continue Vicodin PRN -PT OT -Needs weight loss and core strengthening  Persistent atrial fibrillation (HCC) CHA2DS2-VASc score is 6  Saw atrial fib clinic on 10/1 where cardioversion was going to be scheduled, but she had missed an eliquis dose Continue amiodarone and eliquis, currently in sinus rhythm Continue metoprolol, dose decreased  OSA/OHS Has not been on a cpap or bipap. She was supposed to be discharged on bipap at last hospitalization Will order bipap at night while here.  Needs sleep referral  Coronary artery disease involving native coronary artery of native heart without angina pectoris Stable with no complaints of chest pain  Continue lipitor/eliquis.  Diabetes mellitus (HCC) A1c of 7.8 in August 2024 Continue januvia, hold metformin  SSI and accuchecks qac/hs    COPD (chronic obstructive pulmonary disease) (HCC) No signs of exacerbation Continue spiriva and SABA prn         DVT prophylaxis: apixaban Code Status: Full Code Family Communication: none present Disposition Plan: Home [place] 48h  Consultants:    Procedures:   Antimicrobials:    Objective: Vitals:   11/27/22 1722 11/27/22 1744 11/27/22 1954 11/28/22 0044  BP: (!) 176/83  (!) 162/82 (!) 146/90  Pulse: (!) 52 (!) 49 (!) 57 (!) 52  Resp: 20  19 20   Temp: 98.3 F (36.8 C)  (!) 97.4 F (36.3 C) 97.9 F (36.6 C)  TempSrc: Oral  Oral Oral  SpO2: 95% 97% 99% 93%  Weight:  Height:        Intake/Output Summary (Last 24 hours) at 11/28/2022 0725 Last data filed at 11/27/2022 1411 Gross per 24 hour  Intake --  Output 900 ml  Net -900 ml   Filed Weights   11/27/22 1710  Weight: 128 kg    Examination:  General exam: Obese chronically ill female appears calm  and comfortable, AAOx3 80 HEENT: Neck obese unable to assess JVD CVS: S1-S2, regular Lungs: Decreased breath sounds at the bases, otherwise clear Abdomen: Soft, nontender, bowel sounds present, lower abdominal wall edema Extremities: Edema in upper thighs Skin: No rashes Psychiatry:  Mood & affect appropriate.     Data Reviewed:   CBC: Recent Labs  Lab 11/27/22 0633 11/28/22 0254  WBC 8.8 9.3  HGB 12.5 11.4*  HCT 42.9 39.3  MCV 86.1 86.0  PLT 212 222   Basic Metabolic Panel: Recent Labs  Lab 11/27/22 0633 11/27/22 1611 11/28/22 0254  NA 140  --  139  K 4.6  --  3.7  CL 104  --  101  CO2 28  --  28  GLUCOSE 121*  --  141*  BUN 16  --  13  CREATININE 1.13*  --  1.00  CALCIUM 9.1  --  8.8*  MG  --  2.1  --    GFR: Estimated Creatinine Clearance: 72.4 mL/min (by C-G formula based on SCr of 1 mg/dL). Liver Function Tests: Recent Labs  Lab 11/27/22 0633  AST 21  ALT 16  ALKPHOS 60  BILITOT 0.7  PROT 7.7  ALBUMIN 3.4*   No results for input(s): "LIPASE", "AMYLASE" in the last 168 hours. No results for input(s): "AMMONIA" in the last 168 hours. Coagulation Profile: No results for input(s): "INR", "PROTIME" in the last 168 hours. Cardiac Enzymes: No results for input(s): "CKTOTAL", "CKMB", "CKMBINDEX", "TROPONINI" in the last 168 hours. BNP (last 3 results) No results for input(s): "PROBNP" in the last 8760 hours. HbA1C: No results for input(s): "HGBA1C" in the last 72 hours. CBG: Recent Labs  Lab 11/27/22 2107 11/28/22 0629  GLUCAP 136* 129*   Lipid Profile: No results for input(s): "CHOL", "HDL", "LDLCALC", "TRIG", "CHOLHDL", "LDLDIRECT" in the last 72 hours. Thyroid Function Tests: No results for input(s): "TSH", "T4TOTAL", "FREET4", "T3FREE", "THYROIDAB" in the last 72 hours. Anemia Panel: No results for input(s): "VITAMINB12", "FOLATE", "FERRITIN", "TIBC", "IRON", "RETICCTPCT" in the last 72 hours. Urine analysis:    Component Value Date/Time    COLORURINE YELLOW 09/06/2022 1510   APPEARANCEUR HAZY (A) 09/06/2022 1510   LABSPEC >1.046 (H) 09/06/2022 1510   PHURINE 5.0 09/06/2022 1510   GLUCOSEU NEGATIVE 09/06/2022 1510   HGBUR NEGATIVE 09/06/2022 1510   BILIRUBINUR NEGATIVE 09/06/2022 1510   KETONESUR NEGATIVE 09/06/2022 1510   PROTEINUR 30 (A) 09/06/2022 1510   UROBILINOGEN 0.2 07/20/2012 1013   NITRITE NEGATIVE 09/06/2022 1510   LEUKOCYTESUR NEGATIVE 09/06/2022 1510   Sepsis Labs: @LABRCNTIP (procalcitonin:4,lacticidven:4)  )No results found for this or any previous visit (from the past 240 hour(s)).   Radiology Studies: CT Angio Chest Pulmonary Embolism (PE) W or WO Contrast  Result Date: 11/27/2022 CLINICAL DATA:  Pulmonary embolism (PE) suspected, high prob EXAM: CT ANGIOGRAPHY CHEST WITH CONTRAST TECHNIQUE: Multidetector CT imaging of the chest was performed using the standard protocol during bolus administration of intravenous contrast. Multiplanar CT image reconstructions and MIPs were obtained to evaluate the vascular anatomy. RADIATION DOSE REDUCTION: This exam was performed according to the departmental dose-optimization program which includes automated exposure control,  adjustment of the mA and/or kV according to patient size and/or use of iterative reconstruction technique. CONTRAST:  75mL OMNIPAQUE IOHEXOL 350 MG/ML SOLN COMPARISON:  CT 09/08/2022 FINDINGS: Cardiovascular: Satisfactory opacification of the pulmonary arteries. No pulmonary arterial filling defect to the segmental branch level to suggest pulmonary embolism. Main pulmonary trunk is dilated at 4.3 cm. Mid ascending thoracic aorta measures 4.1 cm in diameter, unchanged from prior. Scattered atherosclerotic vascular calcifications of the aorta and coronary arteries. Cardiomegaly. No pericardial effusion. Mediastinum/Nodes: No enlarged mediastinal, hilar, or axillary lymph nodes. Thyroid gland, trachea, and esophagus demonstrate no significant findings.  Lungs/Pleura: Trace right pleural effusion. Mild mosaic attenuation both lung fields. No airspace consolidation. No pneumothorax. Upper Abdomen: Reflux of contrast into the IVC and hepatic veins. No acute abnormality. Musculoskeletal: Degenerative disc disease with endplate irregularity at T8-9 is unchanged in appearance compared to the prior CT without progressive endplate erode Review of the MIP images confirms the above findings. IMPRESSION: 1. No evidence of pulmonary embolism. 2. Trace right pleural effusion. 3. Mild mosaic attenuation both lung fields, which can be seen in the setting of small airways disease. 4. Ascending thoracic aortic aneurysm measuring 4.1 cm in diameter. Recommend annual imaging followup by CTA or MRA. This recommendation follows 2010 ACCF/AHA/AATS/ACR/ASA/SCA/SCAI/SIR/STS/SVM Guidelines for the Diagnosis and Management of Patients with Thoracic Aortic Disease. Circulation. 2010; 121: U981-X914. Aortic aneurysm NOS (ICD10-I71.9) 5. Cardiomegaly with reflux of contrast into the IVC and hepatic veins, suggestive of right heart dysfunction. 6. Dilated main pulmonary trunk, which can be seen in the setting of pulmonary arterial hypertension. 7. Aortic atherosclerosis (ICD10-I70.0). Electronically Signed   By: Duanne Guess D.O.   On: 11/27/2022 11:26   CT T-SPINE NO CHARGE  Result Date: 11/27/2022 CLINICAL DATA:  Shortness of breath and back pain. Previous abnormal imaging appearance of T8-9 with disc aspiration on 09/16/2022 being negative for infection. EXAM: CT THORACIC SPINE WITH CONTRAST TECHNIQUE: Multiplanar CT images of the thoracic spine were reconstructed from contemporary CT of the Chest. RADIATION DOSE REDUCTION: This exam was performed according to the departmental dose-optimization program which includes automated exposure control, adjustment of the mA and/or kV according to patient size and/or use of iterative reconstruction technique. CONTRAST:  No additional.  COMPARISON:  Thoracic spine CT 09/20/2022 FINDINGS: Alignment: Mild upper thoracic levoscoliosis. No significant listhesis. Vertebrae: Prominent endplate irregularity and sclerosis at T8-9 is unchanged from the prior CT without evidence of interval bone erosion. No acute fracture. Paraspinal and other soft tissues: Unchanged mild paravertebral soft tissue thickening at T8-9. No paraspinal fluid collection. Remainder of the chest reported separately. Disc levels: Diffuse thoracic disc degeneration. Prominent left-sided facet spurring in the upper thoracic spine with mild spinal stenosis at T5-6. No evidence of high-grade spinal stenosis. IMPRESSION: No acute osseous abnormality. 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 Electronically Signed   By: Sebastian Ache M.D.   On: 11/27/2022 11:13   DG Chest Port 1 View  Result Date: 11/27/2022 CLINICAL DATA:  67 year old female with history of shortness of breath and chest pain. Back pain. EXAM: PORTABLE CHEST 1 VIEW COMPARISON:  Chest x-ray 09/09/2022. FINDINGS: Lung volumes are low. No confluent consolidative airspace disease. No pleural effusions. No pneumothorax. Cephalization of the pulmonary vasculature, without frank pulmonary edema. Heart size is mildly enlarged. Upper mediastinal contours are within normal limits. IMPRESSION: 1. Cardiomegaly with pulmonary venous congestion, but no frank pulmonary edema. Electronically Signed   By: Brayton Mars.D.  On: 11/27/2022 06:40     Scheduled Meds:  amiodarone  200 mg Oral Daily   apixaban  5 mg Oral BID   atorvastatin  20 mg Oral Daily   furosemide  40 mg Intravenous Q12H   gabapentin  300 mg Oral QHS   insulin aspart  0-15 Units Subcutaneous TID WC   lidocaine  1 patch Transdermal Q24H   linagliptin  5 mg Oral Daily   losartan  25 mg Oral Daily   metoprolol succinate  50 mg Oral Daily   umeclidinium bromide  1 puff  Inhalation Daily   Continuous Infusions:   LOS: 0 days    Time spent:    Zannie Cove, MD [facility]   11/28/2022, 7:25 AM

## 2022-11-28 NOTE — Evaluation (Addendum)
Physical Therapy Evaluation Patient Details Name: Marketia Tenbroeck MRN: [id number] DOB: [birth year] Today's Date: 11/28/2022  History of Present Illness  Jayani Begnoche is a 67 y.o. female who presented to Ed with complaints of low back pain and shortness of breath found to be hypoxic on room air to 80s. PMH: HTN, PAF on AC, T2DM, cerebral aneurysm s/p craniotomy and clipping, COPD, untreated OSA/OHS,  CAD, HFpEF, cor pulmonale, and morbid obesity   Clinical Impression  Pt awoken from deep sleep in chair. Pt's HR at 49bpm, SpO2 95% on RA. Once awoken pt alert but impulsive with decreased safety awareness, urgently trying to get to [facility], refusing use of RW and ambulating to bathroom holding onto bed, sink, back of chair with noted instability and labored breathing. Pt desired to perform pericare in bathroom in private. PT overheard labored efforts and breathing. When pt emerged pt with noted SOB, SPO2 at 84% on RA and HR at 88bpm. Pt decline any help from therapist or use of RW. Pt c/o back pain and called RN for pain medicine. Pt home alone during the day and does have to ascend 22 steps to access apartment. Acute PT to cont to follow. Recommend OT eval to address ability to complete ADLs in safe manor as patient is home alone during the day. Recommend follow up HHPT provided pt can demo ability to negotiate 22 stairs to access apartment.        If plan is discharge home, recommend the following: A little help with walking and/or transfers;A little help with bathing/dressing/bathroom;Assist for transportation;Help with stairs or ramp for entrance   Can travel by private vehicle        Equipment Recommendations    Recommendations for Other Services       Functional Status Assessment Patient has had a recent decline in their functional status and demonstrates the ability to make significant improvements in function in a reasonable and predictable amount of time.     Precautions /  Restrictions Precautions Precautions: Fall Precaution Comments: watch SpO2, morbid obesity Restrictions Weight Bearing Restrictions: No      Mobility  Bed Mobility               General bed mobility comments: pt received in recliner chair    Transfers Overall transfer level: Needs assistance Equipment used: None Transfers: Sit to/from Stand, Bed to chair/wheelchair/BSC Sit to Stand: Mod assist   Step pivot transfers: Min assist       General transfer comment: due to large body habitus pt with difficulty bringing trunk forward for anterior weight shift to power up into standing, modA to power up, wide base of support, increased time due to pt report of "back stiffness" pt with urgency to use BSC with some incontinence due to inability to move efficiently    Ambulation/Gait Ambulation/Gait assistance: Min assist Gait Distance (Feet): 15 Feet (x2) Assistive device: Rolling walker (2 wheels), None Gait Pattern/deviations: Step-to pattern, Decreased stride length, Wide base of support Gait velocity: dec Gait velocity interpretation: <1.8 ft/sec, indicate of risk for recurrent falls   General Gait Details: encouraged pt to use RW, pt with noted labored breathing, antalgic waddle type gait with heavy dependence on UEs when amb to the bathroom, pt declined use of RW when leaving bathroom and furniture walked to EOB. noted SOB/wheezing, SPO2 at 84% on RA, pt with significant trunk flexion  Careers information officer  Tilt Bed    Modified Rankin (Stroke Patients Only)       Balance Overall balance assessment: Needs assistance Sitting-balance support: Feet supported, No upper extremity supported Sitting balance-Leahy Scale: Fair     Standing balance support: Single extremity supported, During functional activity Standing balance-Leahy Scale: Poor Standing balance comment: requires minimally unilateral UE support for standing                              Pertinent Vitals/Pain Pain Assessment Pain Assessment: Faces Faces Pain Scale: Hurts even more Pain Location: back Pain Descriptors / Indicators: Grimacing Pain Intervention(s): Patient requesting pain meds-RN notified    Home Living Family/patient expects to be discharged to:: Private residence Living Arrangements: Children (son) Available Help at Discharge: Family;Friend(s);Available PRN/intermittently Type of Home: Apartment Home Access: Stairs to enter Entrance Stairs-Rails: Right;Left Entrance Stairs-Number of Steps: 22   Home Layout: One level Home Equipment: Grab bars - tub/shower;Cane - single point      Prior Function Prior Level of Function : Independent/Modified Independent             Mobility Comments: reports not using any AD in the apartment, doesn't drive ADLs Comments: pt reports not using tub to shower to often     Extremity/Trunk Assessment   Upper Extremity Assessment Upper Extremity Assessment: Generalized weakness    Lower Extremity Assessment Lower Extremity Assessment: Generalized weakness    Cervical / Trunk Assessment Cervical / Trunk Assessment: Other exceptions (large body habitus)  Communication   Communication Communication: No apparent difficulties  Cognition Arousal: Alert (sleeping upon arrival, took a minute to awake) Behavior During Therapy: Impulsive Overall Cognitive Status: No family/caregiver present to determine baseline cognitive functioning                                 General Comments: pt awoken from deep sleep, HR at 49bpm, once pt awakened pt alert however mild confused about whereabouts stating "I just haven't woken up yet" able to state date, year, and hospital, pt impulsive with noted decreased safety awareness, suspect this to be baseline        General Comments General comments (skin integrity, edema, etc.): pt with bilat LE discoloration, noted bilat LE edema, HR 49bpm  upon arrival, increased to 89bpm during amb to bathroom, pt performed pericare in bathroom in private as requested. Unsure of technique however PT could hear pt with labored breathing, upon exiting pt leaning on sink counter and urgently trying to get to edge of bed    Exercises     Assessment/Plan    PT Assessment Patient needs continued PT services  PT Problem List Decreased strength;Decreased activity tolerance;Decreased balance;Cardiopulmonary status limiting activity;Decreased mobility;Decreased safety awareness       PT Treatment Interventions DME instruction;Gait training;Stair training;Functional mobility training;Therapeutic activities;Therapeutic exercise;Balance training;Neuromuscular re-education    PT Goals (Current goals can be found in the Care Plan section)  Acute Rehab PT Goals Patient Stated Goal: didn't state PT Goal Formulation: With patient Time For Goal Achievement: 12/12/22 Potential to Achieve Goals: Good    Frequency Min 3X/week     Co-evaluation               AM-PAC PT "6 Clicks" Mobility  Outcome Measure Help needed turning from your back to your side while in a flat bed without using bedrails?: A Little Help needed moving from  lying on your back to sitting on the side of a flat bed without using bedrails?: A Little Help needed moving to and from a bed to a chair (including a wheelchair)?: A Little Help needed standing up from a chair using your arms (e.g., wheelchair or bedside chair)?: A Little Help needed to walk in hospital room?: A Little Help needed climbing 3-5 steps with a railing? : A Lot 6 Click Score: 17    End of Session   Activity Tolerance: Patient limited by fatigue Patient left: in bed;with call bell/phone within reach;with chair alarm set;with nursing/sitter in room (sitting EOB to eat lunch)   PT Visit Diagnosis: Unsteadiness on feet (R26.81);Muscle weakness (generalized) (M62.81);Pain Pain - part of body:  (back)     Time: 4034-7425 PT Time Calculation (min) (ACUTE ONLY): 29 min   Charges:   PT Evaluation $PT Eval Moderate Complexity: 1 Mod PT Treatments $Gait Training: 8-22 mins PT General Charges $$ ACUTE PT VISIT: 1 Visit         Lewis Shock, PT, DPT Acute Rehabilitation Services Secure chat preferred Office #: [phone]   Iona Hansen 11/28/2022, 2:10 PM

## 2022-11-28 NOTE — Progress Notes (Signed)
Medication not available at this time. Pharmacy has been notified.

## 2022-11-28 NOTE — Evaluation (Signed)
Occupational Therapy Evaluation Patient Details Name: Linda Chase MRN: [id number] DOB: 18-Nov-1955 Today's Date: 11/28/2022   History of Present Illness Krisanne Arai is a 67 y.o. female who presented to ED on 11/16 with low back pain and SOB, found to be hypoxic on room air to 80s. Admitted for acute on chronic R heart failure wtih acute respiratory failure with hypoxia. PMH: HTN, PAF on AC, T2DM, cerebral aneurysm s/p craniotomy and clipping, COPD, untreated OSA/OHS,  CAD, HFpEF, cor pulmonale, and morbid obesity   Clinical Impression   Pt reports ind at baseline with ADLs and functional mobility, lives at home with her son. Pt currently reports back pain but is able to ambulate in room for ADL tasks with CGA and without AD. Pt CGA - mod A for ADLs, reports difficulty with LB ADLs, would benefit from AE trial in future sessions. SpO2 down to 86% on RA but quickly incr to 90's with cues for PLB. Pt presenting with impairments listed below, will follow acutely. Recommend HHOT at d/c.       If plan is discharge home, recommend the following: A little help with walking and/or transfers;A lot of help with bathing/dressing/bathroom;Assistance with cooking/housework;Direct supervision/assist for medications management;Direct supervision/assist for financial management;Assist for transportation;Help with stairs or ramp for entrance    Functional Status Assessment  Patient has had a recent decline in their functional status and demonstrates the ability to make significant improvements in function in a reasonable and predictable amount of time.  Equipment Recommendations  Tub/shower seat    Recommendations for Other Services PT consult     Precautions / Restrictions Precautions Precautions: Fall Precaution Comments: watch SpO2, morbid obesity Restrictions Weight Bearing Restrictions: No      Mobility Bed Mobility               General bed mobility comments: seated EOB  upon arrival, left in recliner    Transfers Overall transfer level: Needs assistance Equipment used: None Transfers: Sit to/from Stand Sit to Stand: Contact guard assist                  Balance Overall balance assessment: Needs assistance Sitting-balance support: Feet supported, No upper extremity supported Sitting balance-Leahy Scale: Fair     Standing balance support: During functional activity Standing balance-Leahy Scale: Fair Standing balance comment: reaches out intermittently for external support in standing                           ADL either performed or assessed with clinical judgement   ADL Overall ADL's : Needs assistance/impaired Eating/Feeding: Set up   Grooming: Contact guard assist;Wash/dry hands   Upper Body Bathing: Minimal assistance   Lower Body Bathing: Moderate assistance   Upper Body Dressing : Minimal assistance   Lower Body Dressing: Moderate assistance   Toilet Transfer: Contact guard assist   Toileting- Clothing Manipulation and Hygiene: Contact guard assist       Functional mobility during ADLs: Contact guard assist       Vision   Vision Assessment?: No apparent visual deficits     Perception Perception: Not tested       Praxis Praxis: Not tested       Pertinent Vitals/Pain Pain Assessment Pain Assessment: Faces Pain Score: 6  Faces Pain Scale: Hurts even more Pain Location: back Pain Descriptors / Indicators: Grimacing Pain Intervention(s): Limited activity within patient's tolerance, Monitored during session, Repositioned     Extremity/Trunk  Assessment Upper Extremity Assessment Upper Extremity Assessment: Generalized weakness   Lower Extremity Assessment Lower Extremity Assessment: Defer to PT evaluation   Cervical / Trunk Assessment Cervical / Trunk Assessment: Other exceptions (body habitus)   Communication Communication Communication: No apparent difficulties   Cognition Arousal:  Alert Behavior During Therapy: WFL for tasks assessed/performed Overall Cognitive Status: Within Functional Limits for tasks assessed                                       General Comments  SpO2 down to 86% on RA improved with PLB to 90's    Exercises     Shoulder Instructions      Home Living Family/patient expects to be discharged to:: Private residence Living Arrangements: Children (son) Available Help at Discharge: Family;Friend(s);Available PRN/intermittently Type of Home: Apartment Home Access: Stairs to enter Entrance Stairs-Number of Steps: 22 Entrance Stairs-Rails: Right;Left Home Layout: One level     Bathroom Shower/Tub: Chief Strategy Officer: Standard     Home Equipment: Grab bars - tub/shower;Cane - single point          Prior Functioning/Environment Prior Level of Function : Independent/Modified Independent             Mobility Comments: reports not using any AD in the apartment, doesn't drive ADLs Comments: pt reports not using tub to shower to often        OT Problem List: Decreased strength;Decreased range of motion;Decreased activity tolerance;Impaired balance (sitting and/or standing);Cardiopulmonary status limiting activity;Decreased safety awareness;Decreased knowledge of use of DME or AE      OT Treatment/Interventions: Self-care/ADL training;Therapeutic exercise;Energy conservation;DME and/or AE instruction;Therapeutic activities;Patient/family education;Visual/perceptual remediation/compensation;Cognitive remediation/compensation;Balance training    OT Goals(Current goals can be found in the care plan section) Acute Rehab OT Goals Patient Stated Goal: none stated OT Goal Formulation: With patient Time For Goal Achievement: 12/12/22 Potential to Achieve Goals: Good ADL Goals Pt Will Perform Upper Body Dressing: with modified independence Pt Will Perform Lower Body Dressing: with modified independence;with  adaptive equipment;sitting/lateral leans Pt Will Transfer to Toilet: with modified independence;ambulating;regular height toilet Pt Will Perform Tub/Shower Transfer: Tub transfer;Shower transfer;with modified independence;ambulating;shower seat Additional ADL Goal #1: pt will verbalize x3 energy conservation strategies in prep for ADLs  OT Frequency: Min 1X/week    Co-evaluation              AM-PAC OT "6 Clicks" Daily Activity     Outcome Measure Help from another person eating meals?: None Help from another person taking care of personal grooming?: A Little Help from another person toileting, which includes using toliet, bedpan, or urinal?: A Little Help from another person bathing (including washing, rinsing, drying)?: A Lot Help from another person to put on and taking off regular upper body clothing?: A Little Help from another person to put on and taking off regular lower body clothing?: A Lot 6 Click Score: 17   End of Session Nurse Communication: Mobility status  Activity Tolerance: Patient tolerated treatment well Patient left: with call bell/phone within reach;in chair;with chair alarm set  OT Visit Diagnosis: Unsteadiness on feet (R26.81);Other abnormalities of gait and mobility (R26.89);Muscle weakness (generalized) (M62.81)                Time: 1610-9604 OT Time Calculation (min): 28 min Charges:  OT General Charges $OT Visit: 1 Visit OT Evaluation $OT Eval Moderate Complexity: 1 Mod OT  Treatments $Self Care/Home Management : 8-22 mins  Carver Fila, OTD, OTR/L SecureChat Preferred Acute Rehab [phone]   Dalphine Handing 11/28/2022, 4:19 PM

## 2022-11-29 ENCOUNTER — Encounter (HOSPITAL_COMMUNITY): Payer: Self-pay | Admitting: Internal Medicine

## 2022-11-29 ENCOUNTER — Other Ambulatory Visit (HOSPITAL_COMMUNITY): Payer: Self-pay

## 2022-11-29 DIAGNOSIS — I50813 Acute on chronic right heart failure: Secondary | ICD-10-CM | POA: Diagnosis not present

## 2022-11-29 LAB — CBC
HCT: 39.5 % (ref 36.0–46.0)
Hemoglobin: 11.6 g/dL — ABNORMAL LOW (ref 12.0–15.0)
MCH: 24.9 pg — ABNORMAL LOW (ref 26.0–34.0)
MCHC: 29.4 g/dL — ABNORMAL LOW (ref 30.0–36.0)
MCV: 84.9 fL (ref 80.0–100.0)
Platelets: 240 10*3/uL (ref 150–400)
RBC: 4.65 MIL/uL (ref 3.87–5.11)
RDW: 16.9 % — ABNORMAL HIGH (ref 11.5–15.5)
WBC: 7.3 10*3/uL (ref 4.0–10.5)
nRBC: 0 % (ref 0.0–0.2)

## 2022-11-29 LAB — BASIC METABOLIC PANEL
Anion gap: 9 (ref 5–15)
BUN: 16 mg/dL (ref 8–23)
CO2: 29 mmol/L (ref 22–32)
Calcium: 8.8 mg/dL — ABNORMAL LOW (ref 8.9–10.3)
Chloride: 103 mmol/L (ref 98–111)
Creatinine, Ser: 1.06 mg/dL — ABNORMAL HIGH (ref 0.44–1.00)
GFR, Estimated: 58 mL/min — ABNORMAL LOW (ref >60)
Glucose, Bld: 136 mg/dL — ABNORMAL HIGH (ref 70–99)
Potassium: 3.8 mmol/L (ref 3.5–5.1)
Sodium: 141 mmol/L (ref 135–145)

## 2022-11-29 LAB — GLUCOSE, CAPILLARY
Glucose-Capillary: 127 mg/dL — ABNORMAL HIGH (ref 70–99)
Glucose-Capillary: 116 mg/dL — ABNORMAL HIGH (ref 70–99)
Glucose-Capillary: 144 mg/dL — ABNORMAL HIGH (ref 70–99)
Glucose-Capillary: 107 mg/dL — ABNORMAL HIGH (ref 70–99)
Glucose-Capillary: 99 mg/dL (ref 70–99)

## 2022-11-29 MED ORDER — POLYETHYLENE GLYCOL 3350 17 G PO PACK
17.0000 g | PACK | Freq: Every day | ORAL | Status: DC
  Administered 2022-11-29 – 2022-11-30 (×2): 17 g via ORAL
  Filled 2022-11-29 (×2): qty 1

## 2022-11-29 MED ORDER — SPIRONOLACTONE 12.5 MG HALF TABLET
12.5000 mg | ORAL_TABLET | Freq: Every day | ORAL | Status: DC
  Administered 2022-11-29 – 2022-11-30 (×2): 12.5 mg via ORAL
  Filled 2022-11-29 (×2): qty 1

## 2022-11-29 MED ORDER — POTASSIUM CHLORIDE CRYS ER 20 MEQ PO TBCR
40.0000 meq | EXTENDED_RELEASE_TABLET | Freq: Once | ORAL | Status: AC
  Administered 2022-11-29: 40 meq via ORAL
  Filled 2022-11-29: qty 2

## 2022-11-29 MED ORDER — HYDROCODONE-ACETAMINOPHEN 5-325 MG PO TABS
1.0000 | ORAL_TABLET | ORAL | Status: DC | PRN
  Administered 2022-11-29 (×2): 1 via ORAL
  Filled 2022-11-29 (×2): qty 1

## 2022-11-29 NOTE — Progress Notes (Signed)
Pt has PRN BIPAP orders, no distress noted.

## 2022-11-29 NOTE — Progress Notes (Signed)
Mobility Specialist Progress Note:    11/29/22 1112  Mobility  Activity Ambulated with assistance in hallway  Level of Assistance Contact guard assist, steadying assist  Assistive Device None  Distance Ambulated (ft) 300 ft  Activity Response Tolerated well  Mobility Referral Yes  $Mobility charge 1 Mobility  Mobility Specialist Start Time (ACUTE ONLY) 0948  Mobility Specialist Stop Time (ACUTE ONLY) 1002  Mobility Specialist Time Calculation (min) (ACUTE ONLY) 14 min   Pt received in chair eager for mobility. No physical assistance needed throughout session, just contact guard d/t mild unsteadiness. Had x1 LOB but self corrected. Returned to room w/o fault. Left seated EOB w/ call bell and personal belongings in reach. All needs met.  Thompson Grayer Mobility Specialist  Please contact vis Secure Chat or  Rehab Office [phone]

## 2022-11-29 NOTE — Plan of Care (Signed)
  Problem: Education: Goal: Ability to describe self-care measures that may prevent or decrease complications (Diabetes Survival Skills Education) will improve Outcome: Progressing   Problem: Coping: Goal: Ability to adjust to condition or change in health will improve Outcome: Progressing   Problem: Fluid Volume: Goal: Ability to maintain a balanced intake and output will improve Outcome: Progressing   Problem: Health Behavior/Discharge Planning: Goal: Ability to identify and utilize available resources and services will improve Outcome: Progressing   Problem: Health Behavior/Discharge Planning: Goal: Ability to manage health-related needs will improve Outcome: Progressing   Problem: Metabolic: Goal: Ability to maintain appropriate glucose levels will improve Outcome: Progressing

## 2022-11-29 NOTE — Progress Notes (Signed)
Heart Failure Stewardship Pharmacist Progress Note   PCP: Rometta Emery, MD PCP-Cardiologist: Dietrich Pates, MD    HPI:  67 yo F with PMH of CHF, HTN, T2DM, afib, cerebral aneurysm s/p craniotomy and clipping, COPD, OSA, CAD, cor pulmonale, and morbid obesity.  Diagnosed with CHF in April 2024 in [place].   Presented to ED for evaluation of back pain in 08/24. Admitted with acute respiratory failure with hypoxia and hypercapnia in setting of untreated OSA/OHS/acute CHF and Afib with RVR. Required BiPAP. Cardiology consulted. Echo with EF 50%, mildly reduced RV, moderate to severe TR. She was diuresed with IV lasix, 327>>285 lb. Had possible degenerative changes vs discitis/osteomyelitis in T9-T9. Started on antibiotics but was not able to get MRI due to prior cerebral aneurysm coiling. Disc aspirate with no growth and antibiotics discontinued.   Was seen in HF [facility] clinic on 9/25. Reported improvement in LE edema. Weight 279 lbs. Compliant with medications. Losartan was added. SGLT2i deferred with limited mobility and body habitus.   Presented to the ED on 11/16 with shortness of breath and back pain. Found to be hypoxic in the 80s. Notable for worsening dyspnea on exertion, orthopnea, and LE edema. Does not weigh herself at home. Did not take lasix day before arrival. Denies changes in diet.   Reports that she feels her shortness of breath worsens when her back is hurting. Does not like to take lasix before running errands but often times will take the lasix when she gets back. She states she takes a day off of lasix here and there. Living with her son now. States that his apartment complex only has stairs and she has to travel up and down a full flight of stairs to get to their apartment. Signed a release for medical records from [place] providers to see if she would be able to get MRI moving forward. Reports she feels like the amiodarone or the metoprolol are causing her to have lightheadedness at  home.   Current HF Medications: Diuretic: furosemide 40 mg IV BID Beta Blocker: metoprolol XL 50 mg daily ACE/ARB/ARNI: losartan 25 mg daily  Prior to admission HF Medications: Diuretic: furosemide 80 mg BID Beta blocker: metoprolol XL 100 mg daily ACE/ARB/ARNI: losartan 25 mg daily  Pertinent Lab Values: Serum creatinine 1.06, BUN 16, Potassium 3.8, Sodium 141, BNP 213.7, Magnesium 2.1  Vital Signs: Weight: 284 lbs (admission weight: 282 lbs) Blood pressure: 110-150/70s  Heart rate: 50s  I/O: net -0.4L yesterday; net -2.4L since admission  Medication Assistance / Insurance Benefits Check: Does the patient have prescription insurance?  Yes Type of insurance plan: [employer]  Outpatient Pharmacy:  Prior to admission outpatient pharmacy: [facility] Is the patient willing to use [facility] TOC pharmacy at discharge? Yes Is the patient willing to transition their outpatient pharmacy to utilize a [facility] outpatient pharmacy?  No    Assessment: 1. Acute on chronic diastolic CHF (LVEF 50%). NYHA class III symptoms. - Continue furosemide 40 mg IV BID. Strict I/Os and daily weights. Still has LE edema. Has been up to walk once today. Keep K>4 and Mg>2. - Continue metoprolol XL 50 mg daily (may not be able to tolerate 100 mg daily at home). - Continue losartan 25 mg daily - Consider adding spironolactone 12.5 mg daily - Caution SGLT2i with limited mobility and body habitus   Plan: 1) Medication changes recommended at this time: - Add spironolactone 12.5 mg daily  2) Patient assistance: - Sherryll Burger  copay $11.20  - Farxiga/Jardiance copay $11.20  3)  Education  - Initial education completed - Full education to be completed prior to discharge  Sharen Hones, PharmD, BCPS Heart Failure Engineer, building services Phone [phone]

## 2022-11-29 NOTE — TOC Initial Note (Signed)
Transition of Care [facility]) - Initial/Assessment Note    Patient Details  Name: Linda Chase MRN: [id number] Date of Birth: 09-Feb-1955  Transition [facility] CM/SW Contact:    Leone Haven, RN Phone Number: 11/29/2022, 2:32 PM  Clinical Narrative:                 From home with son, has PCP and insurance on file, states has no HH services in place at this time, did have HH with [facility] in the past, has a walker at home.  States son will transport her home at [employer] and he is support system, states gets medications from [employer] [facility] and [facility] [place].  Pta self ambulatory with walker. Patient would like a shower chair, informed her that she will need to pay out of pocket for it or she could get one less expensive at [facility].  She states she will get one from [facility].  She states she still did not get her bsc, NCM informed her that  Mitch with [employer] states they delivered it to her home and he sent photo of the bsc standing against her wall at her home.  Patient states she would like to continue with [facility] for HHPT, HHOT.  NCM made referral to [facility] he is able to take referral.  Soc will begin 24 to 48 hrs post dc.    Expected Discharge Plan: Home w Home Health Services Barriers to Discharge: Continued Medical Work up   Patient Goals and CMS Choice Patient states their goals for this hospitalization and ongoing recovery are:: return home CMS Medicare.gov Compare Post Acute Care list provided to:: Patient Choice offered to / list presented to : Patient      Expected Discharge Plan and Services In-house Referral: NA Discharge Planning Services: CM Consult Post Acute Care Choice: Home Health Living arrangements for the past 2 months: Single Family Home                 DME Arranged: N/A DME Agency: NA       HH Arranged: PT, OT HH Agency: [facility] Date [facility] Agency Contacted: 11/29/22 Time HH Agency Contacted: 1432 Representative spoke with at  [facility] Agency: Kandee Keen  Prior Living Arrangements/Services Living arrangements for the past 2 months: Single Family Home Lives with:: Adult Children (son) Patient language and need for interpreter reviewed:: Yes Do you feel safe going back to the place where you live?: Yes      Need for Family Participation in Patient Care: Yes (Comment) Care giver support system in place?: Yes (comment) Current home services: DME (walker) Criminal Activity/Legal Involvement Pertinent to Current Situation/Hospitalization: No - Comment as needed  Activities of Daily Living   ADL Screening (condition at time of admission) Independently performs ADLs?: Yes (appropriate for developmental age) Is the patient deaf or have difficulty hearing?: No Does the patient have difficulty seeing, even when wearing glasses/contacts?: No Does the patient have difficulty concentrating, remembering, or making decisions?: No  Permission Sought/Granted Permission sought to share information with : Case Manager Permission granted to share information with : Yes, Verbal Permission Granted     Permission granted to share info w AGENCY: HH        Emotional Assessment Appearance:: Appears stated age Attitude/Demeanor/Rapport: Engaged Affect (typically observed): Appropriate Orientation: : Oriented to Self, Oriented to Place, Oriented to  Time, Oriented to Situation Alcohol / Substance Use: Not Applicable Psych Involvement: No (comment)  Admission diagnosis:  Shortness of  breath [R06.02] Hypoxia [R09.02] Acute midline thoracic back pain [M54.6] Acute on chronic right heart failure (HCC) [I50.813] CHF (congestive heart failure) (HCC) [I50.9] Patient Active Problem List   Diagnosis Date Noted   CHF (congestive heart failure) (HCC) 11/28/2022   Acute on chronic right heart failure with acute respiratory failure with hypoxia 11/27/2022   Acute respiratory failure with hypoxia (HCC) 11/27/2022   Thoracic back pain 11/27/2022    Severe tricuspid regurgitation 11/27/2022   Persistent atrial fibrillation (HCC) 10/12/2022   Hypercoagulable state due to persistent atrial fibrillation (HCC) 10/12/2022   Medication management 10/05/2022   Acute on chronic Cor pulmonale (HCC) 09/13/2022   Obesity hypoventilation syndrome (HCC) 09/13/2022   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) 09/11/2022   Coronary artery disease involving native coronary artery of native heart without angina pectoris 09/11/2022   Acute on chronic heart failure with preserved ejection fraction (HFpEF) (HCC) 09/11/2022   OSA/OHS 09/11/2022   Obesity, Class III, BMI 40-49.9 (morbid obesity) (HCC) 09/06/2022   Paroxysmal atrial fibrillation with RVR (HCC) 09/06/2022   COPD (chronic obstructive pulmonary disease) (HCC) 02/02/2012   Diabetes mellitus (HCC) 02/01/2012   Acute on chronic respiratory failure with hypercapnia (HCC) 02/01/2012   SIRS (systemic inflammatory response syndrome) (HCC) 02/01/2012   Hypertension 02/01/2012   Depression 02/01/2012   Obesity 02/01/2012   PCP:  Rometta Emery, MD Pharmacy:   [id number] - [address] ([place]), [place] - [address] [place] ([place]) [place] [address] Phone: [phone] Fax: [phone]  [facility] [address] [place] [address] Phone: [phone] Fax: [phone]     Social Determinants of Health (SDOH) Social History: SDOH Screenings   Food Insecurity: Food Insecurity Present (11/27/2022)  Housing: Low Risk  (11/27/2022)  Transportation Needs: No Transportation Needs (11/29/2022)  Utilities: At Risk (11/27/2022)  Alcohol Screen: Low Risk  (11/29/2022)  Financial Resource Strain: Low Risk  (11/29/2022)  Tobacco Use: Medium Risk (11/27/2022)   SDOH Interventions: Transportation Interventions: Intervention Not Indicated Alcohol Usage Interventions: Intervention Not Indicated (Score <7) Financial Strain  Interventions: Intervention Not Indicated   Readmission Risk Interventions     No data to display

## 2022-11-29 NOTE — Progress Notes (Signed)
PROGRESS NOTE    Linda Chase  EXB:[id number] DOB: 10-18-1955 DOA: 11/27/2022 PCP: Rometta Emery, MD  Linda Chase is an obese 67/F with history of diastolic CHF, right heart failure, COPD, untreated OSA/OHS, history of cerebral aneurysm status postcraniotomy and clipping, type 2 diabetes mellitus, PAF not on anticoagulation, hypertension, morbid obesity presented to the hospital with shortness of breath and low back pain.  History of intermittent low back pain for months worse with activity and movement.  In addition also reports shortness of breath with activity, poor compliance with diuretics in the setting of her back pain and problems urinating too much on Lasix especially with her back problems.  Admitted in 8/24 for right heart failure and respiratory failure, discharged on diuretics. -In the ED hypertensive, O2 sats 82% on RA, chest x-ray with cardiomegaly, pulmonary vascular congestion, CTA chest with no PE, trace right pleural effusion, mild mosaic attenuation in both lungs can be seen in the setting of small airway disease and thoracic aortic aneurysm 4.1 cm, cardiomegaly with reflux of contrast in the IVC and hepatic veins suggestive of RV dysfunction   Subjective: -Feels better overall, breathing is improving, back pain seems to be better as well  Assessment and Plan:  Acute on chronic diastolic CHF Pulmonary hypertension, RV failure -Last echo 8/24 with EF 50%, mild LVH, mild RV failure, Mod  to severe tricuspid regurg -Untreated OSA OHS contributing -Diuresing on IV Lasix, continue 40 Mg twice daily -Continue ARB, metoprolol -Poor candidate for SGLT2i with morbid obesity and limited mobility -Emphasized BiPAP nightly, needs sleep study as outpatient -Needs follow-up in CHF clinic  Chronic thoracic back pain When admitted in August 2024 there was initial concern of possible discitis/OM; however, MRI could not be obtained due to aneurysm clipping and inability to get  records.  -Blood cultures were negative, aspirate of the disc space negative for growth at that time.  -she had follow up with ID on 9/24 with labs and low suspicion for infection -CT thoracic spine few months ago and CTA chest now suggest DJD -Lidocaine patch, add scheduled Robaxin, continue Vicodin PRN-improving on this regimen -PT OT -Needs weight loss and core strengthening  Persistent atrial fibrillation (HCC) CHA2DS2-VASc score is 6  Saw atrial fib clinic on 10/1 where cardioversion was going to be scheduled, but she had missed an eliquis dose Continue amiodarone and eliquis, currently in sinus rhythm Continue metoprolol, dose decreased  OSA/OHS Has not been on a cpap or bipap. She was supposed to be discharged on bipap at last hospitalization -Ordered BiPAP nightly, needs referral for sleep study at discharge  Coronary artery disease involving native coronary artery of native heart without angina pectoris Stable  Continue lipitor/eliquis.  Diabetes mellitus (HCC) A1c of 7.8 in August 2024 Continue januvia, hold metformin  SSI and accuchecks qac/hs   COPD (chronic obstructive pulmonary disease) (HCC) No signs of exacerbation Continue spiriva and SABA prn   DVT prophylaxis: apixaban Code Status: Full Code Family Communication: none present Disposition Plan: Home likley 48h  Consultants:    Procedures:   Antimicrobials:    Objective: Vitals:   11/29/22 0419 11/29/22 0849 11/29/22 0916 11/29/22 1135  BP: (!) 141/66 (!) 120/102  (!) 110/54  Pulse:  60  60  Resp: 18 18  18   Temp: 97.7 F (36.5 C) 97.7 F (36.5 C)  97.7 F (36.5 C)  TempSrc: Oral Oral  Oral  SpO2: 91% 95% 92% 96%  Weight: 129 kg     Height:  Intake/Output Summary (Last 24 hours) at 11/29/2022 1218 Last data filed at 11/29/2022 0800 Gross per 24 hour  Intake 1080 ml  Output 2450 ml  Net -1370 ml   Filed Weights   11/27/22 1710 11/29/22 0419  Weight: 128 kg 129 kg     Examination:  General exam: Obese chronically ill female appears calm and comfortable, AAOx3 80 HEENT: Neck obese unable to assess JVD CVS: S1-S2, regular Lungs: Decreased breath sounds at the bases, otherwise clear Abdomen: Soft, nontender, bowel sounds present, lower abdominal wall edema Extremities: Edema in upper thighs Skin: No rashes Psychiatry:  Mood & affect appropriate.     Data Reviewed:   CBC: Recent Labs  Lab 11/27/22 0633 11/28/22 0254 11/29/22 0415  WBC 8.8 9.3 7.3  HGB 12.5 11.4* 11.6*  HCT 42.9 39.3 39.5  MCV 86.1 86.0 84.9  PLT 212 222 240   Basic Metabolic Panel: Recent Labs  Lab 11/27/22 0633 11/27/22 1611 11/28/22 0254 11/29/22 0415  NA 140  --  139 141  K 4.6  --  3.7 3.8  CL 104  --  101 103  CO2 28  --  28 29  GLUCOSE 121*  --  141* 136*  BUN 16  --  13 16  CREATININE 1.13*  --  1.00 1.06*  CALCIUM 9.1  --  8.8* 8.8*  MG  --  2.1  --   --    GFR: Estimated Creatinine Clearance: 68.6 mL/min (A) (by C-G formula based on SCr of 1.06 mg/dL (H)). Liver Function Tests: Recent Labs  Lab 11/27/22 0633  AST 21  ALT 16  ALKPHOS 60  BILITOT 0.7  PROT 7.7  ALBUMIN 3.4*   No results for input(s): "LIPASE", "AMYLASE" in the last 168 hours. No results for input(s): "AMMONIA" in the last 168 hours. Coagulation Profile: No results for input(s): "INR", "PROTIME" in the last 168 hours. Cardiac Enzymes: No results for input(s): "CKTOTAL", "CKMB", "CKMBINDEX", "TROPONINI" in the last 168 hours. BNP (last 3 results) No results for input(s): "PROBNP" in the last 8760 hours. HbA1C: No results for input(s): "HGBA1C" in the last 72 hours. CBG: Recent Labs  Lab 11/28/22 1110 11/28/22 1737 11/28/22 2106 11/29/22 0614 11/29/22 1134  GLUCAP 185* 119* 133* 116* 144*   Lipid Profile: No results for input(s): "CHOL", "HDL", "LDLCALC", "TRIG", "CHOLHDL", "LDLDIRECT" in the last 72 hours. Thyroid Function Tests: No results for input(s): "TSH",  "T4TOTAL", "FREET4", "T3FREE", "THYROIDAB" in the last 72 hours. Anemia Panel: No results for input(s): "VITAMINB12", "FOLATE", "FERRITIN", "TIBC", "IRON", "RETICCTPCT" in the last 72 hours. Urine analysis:    Component Value Date/Time   COLORURINE YELLOW 09/06/2022 1510   APPEARANCEUR HAZY (A) 09/06/2022 1510   LABSPEC >1.046 (H) 09/06/2022 1510   PHURINE 5.0 09/06/2022 1510   GLUCOSEU NEGATIVE 09/06/2022 1510   HGBUR NEGATIVE 09/06/2022 1510   BILIRUBINUR NEGATIVE 09/06/2022 1510   KETONESUR NEGATIVE 09/06/2022 1510   PROTEINUR 30 (A) 09/06/2022 1510   UROBILINOGEN 0.2 07/20/2012 1013   NITRITE NEGATIVE 09/06/2022 1510   LEUKOCYTESUR NEGATIVE 09/06/2022 1510   Sepsis Labs: @LABRCNTIP (procalcitonin:4,lacticidven:4)  )No results found for this or any previous visit (from the past 240 hour(s)).   Radiology Studies: No results found.   Scheduled Meds:  amiodarone  200 mg Oral Daily   apixaban  5 mg Oral BID   atorvastatin  20 mg Oral Daily   furosemide  40 mg Intravenous Q12H   gabapentin  300 mg Oral QHS   insulin aspart  0-15 Units Subcutaneous TID WC   lidocaine  1 patch Transdermal Q24H   linagliptin  5 mg Oral Daily   losartan  25 mg Oral Daily   methocarbamol  500 mg Oral TID   metoprolol succinate  50 mg Oral Daily   polyethylene glycol  17 g Oral Daily   senna-docusate  1 tablet Oral BID   umeclidinium bromide  1 puff Inhalation Daily   Continuous Infusions:   LOS: 1 day    Time spent:    Zannie Cove, MD [facility]   11/29/2022, 12:18 PM

## 2022-11-29 NOTE — Progress Notes (Signed)
Heart Failure Nurse Navigator Progress Note  PCP: Rometta Emery, MD PCP-Cardiologist: Tenny Craw Admission Diagnosis: Shortness of breath, Hypoxia, Acute midline thoracic pain.  Admitted from: Home via EMS  Presentation:   Linda Chase presented with back pain, shortness of breath, saturations in the 80's with EMS arrival, BIPAP placed, reports to having missed her dose of lasix the day before. BP 188/81, HR 59, BNP 213, BMI 48.82, CXR with cardiomegaly, pulmonary congestion, CTA chest with no PE,   Patient was educated on the sign and symptoms of heart failure, daily weights, when to call her doctor or go to the ED, Diet/ fluid restrictions, taking all her medications as prescribed and attending all medical appointments, patient reported she lives with her son and uses [employer] for her transportation to appointments. Patient verbalized her understanding of education, a HF TOC follow up was scheduled for 12/14/2022 @ 1 : 30 pm.   ECHO/ LVEF: Last 50%  Clinical Course:  Past Medical History:  Diagnosis Date   Arthritis    Cerebral aneurysm    x2   Depression    Diabetes mellitus    Hypertension      Social History   Socioeconomic History   Marital status: Legally Separated    Spouse name: Not on file   Number of children: 2   Years of education: Not on file   Highest education level: 11th grade  Occupational History   Occupation: disabled  Tobacco Use   Smoking status: Former    Types: Cigarettes   Smokeless tobacco: Never   Tobacco comments:    Former smoker 10/12/22  Vaping Use   Vaping status: Never Used  Substance and Sexual Activity   Alcohol use: No   Drug use: No   Sexual activity: Never  Other Topics Concern   Not on file  Social History Narrative   Not on file   Social Determinants of Health   Financial Resource Strain: Low Risk  (11/29/2022)   Overall Financial Resource Strain (CARDIA)    Difficulty of Paying Living Expenses: Not very hard  Food  Insecurity: Food Insecurity Present (11/27/2022)   Hunger Vital Sign    Worried About Running Out of Food in the Last Year: Sometimes true    Ran Out of Food in the Last Year: Sometimes true  Transportation Needs: No Transportation Needs (11/29/2022)   PRAPARE - Administrator, Civil Service (Medical): No    Lack of Transportation (Non-Medical): No  Physical Activity: Not on file  Stress: Not on file  Social Connections: Not on file   Education Assessment and Provision:  Detailed education and instructions provided on heart failure disease management including the following:  Signs and symptoms of Heart Failure When to call the physician Importance of daily weights Low sodium diet Fluid restriction Medication management Anticipated future follow-up appointments  Patient education given on each of the above topics.  Patient acknowledges understanding via teach back method and acceptance of all instructions.  Education Materials:  "Living Better With Heart Failure" Booklet, HF zone tool, & Daily Weight Tracker Tool.  Patient has scale at home: Yes Patient has pill box at home: Yes    High Risk Criteria for Readmission and/or Poor Patient Outcomes: Heart failure hospital admissions (last 6 months): 0  No Show rate: 6% Difficult social situation: No Demonstrates medication adherence: No, Lasix Primary Language: English Literacy level: Reading, writing, and comprehension  Barriers of Care:   Medication compliance ( Lasix)  Diet/  fluid restrictions Daily weights   Considerations/Referrals:   Referral made to Heart Failure Pharmacist Stewardship: Yes Referral made to Heart Failure CSW/NCM TOC: No Referral made to Heart & Vascular TOC clinic: Yes, Follow up on 12/14/2022 @ 1:30 pm.   Items for Follow-up on DC/TOC: Medication compliance ( Lasix) Diet/ fluid restrictions/ daily weights Continued HF education   Rhae Hammock, BSN, RN Heart Failure Social worker Chat Only

## 2022-11-30 ENCOUNTER — Other Ambulatory Visit (HOSPITAL_COMMUNITY): Payer: Self-pay

## 2022-11-30 LAB — BASIC METABOLIC PANEL
Anion gap: 9 (ref 5–15)
BUN: 14 mg/dL (ref 8–23)
CO2: 29 mmol/L (ref 22–32)
Calcium: 9 mg/dL (ref 8.9–10.3)
Chloride: 100 mmol/L (ref 98–111)
Creatinine, Ser: 1.33 mg/dL — ABNORMAL HIGH (ref 0.44–1.00)
GFR, Estimated: 44 mL/min — ABNORMAL LOW (ref >60)
Glucose, Bld: 165 mg/dL — ABNORMAL HIGH (ref 70–99)
Potassium: 3.8 mmol/L (ref 3.5–5.1)
Sodium: 138 mmol/L (ref 135–145)

## 2022-11-30 LAB — GLUCOSE, CAPILLARY: Glucose-Capillary: 152 mg/dL — ABNORMAL HIGH (ref 70–99)

## 2022-11-30 MED ORDER — JANUVIA 25 MG PO TABS
25.0000 mg | ORAL_TABLET | Freq: Every day | ORAL | 0 refills | Status: DC
  Filled 2022-11-30: qty 30, 30d supply, fill #0

## 2022-11-30 MED ORDER — METFORMIN HCL 1000 MG PO TABS
1000.0000 mg | ORAL_TABLET | Freq: Two times a day (BID) | ORAL | 0 refills | Status: AC
  Filled 2022-11-30: qty 60, 30d supply, fill #0

## 2022-11-30 MED ORDER — METHOCARBAMOL 500 MG PO TABS
500.0000 mg | ORAL_TABLET | Freq: Three times a day (TID) | ORAL | 0 refills | Status: DC
  Filled 2022-11-30: qty 90, 30d supply, fill #0

## 2022-11-30 MED ORDER — SPIRONOLACTONE 25 MG PO TABS
25.0000 mg | ORAL_TABLET | Freq: Every day | ORAL | 0 refills | Status: DC
  Filled 2022-11-30: qty 30, 30d supply, fill #0

## 2022-11-30 MED ORDER — LIDOCAINE 5 % EX PTCH
1.0000 | MEDICATED_PATCH | CUTANEOUS | 0 refills | Status: AC
  Filled 2022-11-30: qty 30, 30d supply, fill #0

## 2022-11-30 MED ORDER — HYDROCODONE-ACETAMINOPHEN 5-325 MG PO TABS
1.0000 | ORAL_TABLET | Freq: Four times a day (QID) | ORAL | 0 refills | Status: DC | PRN
  Filled 2022-11-30: qty 20, 5d supply, fill #0

## 2022-11-30 MED ORDER — METOPROLOL SUCCINATE ER 50 MG PO TB24
50.0000 mg | ORAL_TABLET | Freq: Every day | ORAL | 1 refills | Status: DC
  Filled 2022-11-30: qty 30, 30d supply, fill #0

## 2022-11-30 MED ORDER — POTASSIUM CHLORIDE CRYS ER 20 MEQ PO TBCR
20.0000 meq | EXTENDED_RELEASE_TABLET | Freq: Two times a day (BID) | ORAL | 0 refills | Status: DC
  Filled 2022-11-30: qty 60, 30d supply, fill #0

## 2022-11-30 MED ORDER — SENNOSIDES-DOCUSATE SODIUM 8.6-50 MG PO TABS
1.0000 | ORAL_TABLET | Freq: Two times a day (BID) | ORAL | 1 refills | Status: AC
  Filled 2022-11-30: qty 30, 15d supply, fill #0

## 2022-11-30 NOTE — Progress Notes (Signed)
Physical Therapy Treatment Patient Details Name: Linda Chase MRN: [id number] DOB: August 11, 1955 Today's Date: 11/30/2022   History of Present Illness Linda Chase is a 67 y.o. female who presented to ED on 11/16 with low back pain and SOB, found to be hypoxic on room air to 80s. Admitted for acute on chronic R heart failure wtih acute respiratory failure with hypoxia. PMH: HTN, PAF on AC, T2DM, cerebral aneurysm s/p craniotomy and clipping, COPD, untreated OSA/OHS,  CAD, HFpEF, cor pulmonale, and morbid obesity    PT Comments  Pt admitted with above diagnosis. Pt was able to ambulate without device with O2 sats >88% on RA and no LOB and no DOE. Pt would benefit from HHPT.  She could use a RW as she does furniture walk at times however pt declines RW.  Pt currently with functional limitations due to the deficits listed below (see PT Problem List). Pt will benefit from acute skilled PT to increase their independence and safety with mobility to allow discharge.       If plan is discharge home, recommend the following: A little help with walking and/or transfers;A little help with bathing/dressing/bathroom;Assist for transportation;Help with stairs or ramp for entrance   Can travel by private vehicle        Equipment Recommendations  None recommended by PT    Recommendations for Other Services       Precautions / Restrictions Precautions Precautions: Fall Precaution Comments: watch SpO2, morbid obesity Restrictions Weight Bearing Restrictions: No     Mobility  Bed Mobility               General bed mobility comments: seated in chair on arrival beside bed    Transfers Overall transfer level: Needs assistance Equipment used: None Transfers: Sit to/from Stand Sit to Stand: Supervision           General transfer comment: due to large body habitus pt with difficulty bringing trunk forward for anterior weight shift to power up into standing, but can power up  with use of momentum and wide base of support, increased time due to pt report of "back stiffness"    Ambulation/Gait Ambulation/Gait assistance: Supervision Gait Distance (Feet): 125 Feet Assistive device: None Gait Pattern/deviations: Step-to pattern, Decreased stride length, Wide base of support Gait velocity: dec Gait velocity interpretation: <1.31 ft/sec, indicative of household ambulator   General Gait Details: antalgic waddle type gait able to walk down hallway, SPO2 >88% on RA, pt with some trunk flexion; pt states she cant use RW as she cant carry it in and out of house.  Pt did demonstrate safe gait without RW.  Assisted pt to get dressed and pt was able to dress with incr time and a little help with socks and shoes   Stairs Stairs:  (states she will not have difficulty)           Wheelchair Mobility     Tilt Bed    Modified Rankin (Stroke Patients Only)       Balance Overall balance assessment: Needs assistance Sitting-balance support: Feet supported, No upper extremity supported Sitting balance-Leahy Scale: Fair     Standing balance support: During functional activity, Single extremity supported, No upper extremity supported Standing balance-Leahy Scale: Fair                              Cognition Arousal: Alert Behavior During Therapy: WFL for tasks assessed/performed Overall Cognitive Status: Within  Functional Limits for tasks assessed                                          Exercises      General Comments        Pertinent Vitals/Pain Pain Assessment Pain Assessment: Faces Faces Pain Scale: Hurts even more Pain Location: back Pain Descriptors / Indicators: Grimacing Pain Intervention(s): Limited activity within patient's tolerance, Monitored during session, Repositioned    Home Living                          Prior Function            PT Goals (current goals can now be found in the care plan  section) Acute Rehab PT Goals Patient Stated Goal: didn't state Progress towards PT goals: Progressing toward goals    Frequency    Min 3X/week      PT Plan      Co-evaluation              AM-PAC PT "6 Clicks" Mobility   Outcome Measure  Help needed turning from your back to your side while in a flat bed without using bedrails?: A Little Help needed moving from lying on your back to sitting on the side of a flat bed without using bedrails?: A Little Help needed moving to and from a bed to a chair (including a wheelchair)?: A Little Help needed standing up from a chair using your arms (e.g., wheelchair or bedside chair)?: A Little Help needed to walk in hospital room?: A Little Help needed climbing 3-5 steps with a railing? : A Lot 6 Click Score: 17    End of Session Equipment Utilized During Treatment: Gait belt Activity Tolerance: Patient limited by fatigue Patient left: with call bell/phone within reach;in chair Nurse Communication: Mobility status PT Visit Diagnosis: Unsteadiness on feet (R26.81);Muscle weakness (generalized) (M62.81);Pain Pain - part of body:  (back)     Time: 0865-7846 PT Time Calculation (min) (ACUTE ONLY): 22 min  Charges:    $Gait Training: 8-22 mins PT General Charges $$ ACUTE PT VISIT: 1 Visit                     Linda Chase M,PT Acute Rehab Services [phone]    Bevelyn Buckles 11/30/2022, 1:09 PM

## 2022-11-30 NOTE — Progress Notes (Signed)
Discharge instructions reviewed with pt.  Copy of instructions given to pt. [facility] TOC Pharmacy filling scripts and will be delivered down to the discharge lounge [facility] pharmacy called and informed pt will be in the discharge lounge and will need meds taken down to her)  Pt states her ride to be coming around 1pm to pick her up.  Pt to be d/c'd via wheelchair with belongings, to be escorted by discharge lounge transport.   Leah Thornberry,RN SWOT

## 2022-11-30 NOTE — TOC Transition Note (Signed)
Transition of Care [facility]) - CM/SW Discharge Note   Patient Details  Name: Linda Chase MRN: [id number] Date of Birth: [birth year]  [facility] of [facility] CM/SW Contact:  Leone Haven, RN Phone Number: 11/30/2022, 10:18 AM   Clinical Narrative:    For dc today, NCM notified [place] with [employer].  She has transportation home.    Final next level of care: Home w Home Health Services Barriers to Discharge: Continued Medical Work up   Patient Goals and CMS Choice CMS Medicare.gov Compare Post Acute Care list provided to:: Patient Choice offered to / list presented to : Patient  Discharge Placement                         Discharge Plan and Services Additional resources added to the After Visit Summary for   In-house Referral: NA Discharge Planning Services: CM Consult Post Acute Care Choice: Home Health          DME Arranged: N/A DME Agency: NA       HH Arranged: PT, OT HH Agency: [facility] Date [facility] Agency Contacted: 11/29/22 Time HH Agency Contacted: 1432 Representative spoke with at [facility] Agency: Kandee Keen  Social Determinants of Health (SDOH) Interventions SDOH Screenings   Food Insecurity: Food Insecurity Present (11/27/2022)  Housing: Low Risk  (11/27/2022)  Transportation Needs: No Transportation Needs (11/29/2022)  Utilities: At Risk (11/27/2022)  Alcohol Screen: Low Risk  (11/29/2022)  Financial Resource Strain: Low Risk  (11/29/2022)  Tobacco Use: Medium Risk (11/27/2022)     Readmission Risk Interventions     No data to display

## 2022-11-30 NOTE — Progress Notes (Signed)
Please make sure patient is in the bed prior to placing IVT consult. Thanks!  Delvecchio Madole Loyola Mast, RN

## 2022-11-30 NOTE — Progress Notes (Signed)
Heart Failure Stewardship Pharmacist Progress Note   PCP: Rometta Emery, MD PCP-Cardiologist: Dietrich Pates, MD    HPI:  67 yo F with PMH of CHF, HTN, T2DM, afib, cerebral aneurysm s/p craniotomy and clipping, COPD, OSA, CAD, cor pulmonale, and morbid obesity.  Diagnosed with CHF in April 2024 in [place].   Presented to ED for evaluation of back pain in 08/24. Admitted with acute respiratory failure with hypoxia and hypercapnia in setting of untreated OSA/OHS/acute CHF and Afib with RVR. Required BiPAP. Cardiology consulted. Echo with EF 50%, mildly reduced RV, moderate to severe TR. She was diuresed with IV lasix, 327>>285 lb. Had possible degenerative changes vs discitis/osteomyelitis in T9-T9. Started on antibiotics but was not able to get MRI due to prior cerebral aneurysm coiling. Disc aspirate with no growth and antibiotics discontinued.   Was seen in HF [facility] clinic on 9/25. Reported improvement in LE edema. Weight 279 lbs. Compliant with medications. Losartan was added. SGLT2i deferred with limited mobility and body habitus.   Presented to the ED on 11/16 with shortness of breath and back pain. Found to be hypoxic in the 80s. Notable for worsening dyspnea on exertion, orthopnea, and LE edema. Does not weigh herself at home. Did not take lasix day before arrival. Denies changes in diet.   Reports that she feels her shortness of breath worsens when her back is hurting. Does not like to take lasix before running errands but often times will take the lasix when she gets back. She states she takes a day off of lasix here and there. Living with her son now. States that his apartment complex only has stairs and she has to travel up and down a full flight of stairs to get to their apartment. Signed a release for medical records from [place] providers to see if she would be able to get MRI moving forward. Reports she feels like the amiodarone or the metoprolol are causing her to have lightheadedness at  home. Patient also reported that she had been taking amiodarone BID rather than once daily PTA.   Current HF Medications: Beta Blocker: metoprolol XL 50 mg daily ACE/ARB/ARNI: losartan 25 mg daily MRA: spironolactone 12.5 mg daily  Prior to admission HF Medications: Diuretic: furosemide 80 mg BID Beta blocker: metoprolol XL 100 mg daily ACE/ARB/ARNI: losartan 25 mg daily  Pertinent Lab Values: As of 11/18: Serum creatinine 1.06, BUN 16, Potassium 3.8, Sodium 141, BNP 213.7, Magnesium 2.1  Vital Signs: Weight: 282 lbs (admission weight: 282 lbs) Blood pressure: 150/70s  Heart rate: 50s  I/O: net -2.9L yesterday; net -4L since admission  Medication Assistance / Insurance Benefits Check: Does the patient have prescription insurance?  Yes Type of insurance plan: [employer] Medicare  Outpatient Pharmacy:  Prior to admission outpatient pharmacy: [facility] Is the patient willing to use [facility] TOC pharmacy at discharge? Yes Is the patient willing to transition their outpatient pharmacy to utilize a [facility] outpatient pharmacy?  No    Assessment: 1. Acute on chronic diastolic CHF (LVEF 50%). NYHA class II symptoms. - Volume status improved, off IV lasix. Would continue furosemide 80 mg BID at discharge. Strict I/Os and daily weights. Keep K>4 and Mg>2. - Continue metoprolol XL 50 mg daily (may not be able to tolerate 100 mg daily at home). Educated on taking correct new dose and taking amiodarone once daily rather than BID.  - Continue losartan 25 mg daily - Consider increasing to spironolactone 25 mg daily -  Caution SGLT2i with limited mobility and body habitus   Plan: 1) Medication changes recommended at this time: - Increase to spironolactone 25 mg daily - Add Januvia and metformin to dc meds - patient reported she is running out of these  2) Patient assistance: - Entresto copay $11.20  - Farxiga/Jardiance copay $11.20  3)  Education  - Patient has been  educated on current HF medications and potential additions to HF medication regimen - Patient verbalizes understanding that over the next few months, these medication doses may change and more medications may be added to optimize HF regimen - Patient has been educated on basic disease state pathophysiology and goals of therapy   Sharen Hones, PharmD, BCPS Heart Failure Stewardship Pharmacist Phone [phone]

## 2022-12-07 ENCOUNTER — Encounter (HOSPITAL_COMMUNITY): Payer: Medicare HMO

## 2022-12-08 ENCOUNTER — Other Ambulatory Visit (HOSPITAL_COMMUNITY): Payer: Self-pay | Admitting: Physician Assistant

## 2022-12-13 NOTE — Progress Notes (Signed)
HEART IMPACT TRANSITIONS OF CARE    PCP: Primary Cardiologist:  HPI: Linda Chase is a 67 y.o. female with history of PAF, morbid obesity, OSA not on CPAP( reports prior sleep study), DM II, HTN, cerebral aneurysm s/p craniotomy and clipping, prior tobacco use quit in 2023, COPD, and DJD. Marland Kitchen    Admitted in [place] in 04/24 with atrial fibrillation with RVR (uncertain if new). Spontaneously converted to SR.   Recently moved to the area from [place] in August 2024.    Admitted 08/2022 with acute respiratory failure with hypoxia and hypercapnia in setting of untreated OSA/OHS/acute CHF and Afib with RVR. Required BiPAP. Echo with EF 50%, mildly reduced RV, evidence of RV pressure overload w/ flattened interventricular septum in systole, RVSP 44 mmHg, moderate to severe TR. Initially treated with IV diltiazem but later switched to IV amiodarone. Amiodarone switched to PO for loading. In atrial fibrillation at discharge. She was diuresed with IV lasix, 327>>285 lb. Hospital course was further c/b concern for possible degenerative changes vs discitis/osteomyelitis in T8-T9 spine. She was seen by ID. Initially on IV abx. Unable to get MRI d/t prior cerebral aneurysm coiling. Disc aspirate with no growth. Antibiotics discontinued.  Admitted with A/C HFpEF. Diuresed with IV lasix and transitioned to lasix 80 mg po twice a day. Maintained SR on amio and toprol xl.   Overall feeling fine. Denies SOB/PND/Orthopnea. Appetite ok. No fever or chills. Weight at home  pounds. Taking all medications     ROS: All systems negative except as listed in HPI, PMH and Problem List.  SH:  Social History   Socioeconomic History   Marital status: Legally Separated    Spouse name: Not on file   Number of children: 2   Years of education: Not on file   Highest education level: 11th grade  Occupational History   Occupation: disabled  Tobacco Use   Smoking status: Former    Types: Cigarettes   Smokeless tobacco: Never    Tobacco comments:    Former smoker 10/12/22  Vaping Use   Vaping status: Never Used  Substance and Sexual Activity   Alcohol use: No   Drug use: No   Sexual activity: Never  Other Topics Concern   Not on file  Social History Narrative   Not on file   Social Determinants of Health   Financial Resource Strain: Low Risk  (11/29/2022)   Overall Financial Resource Strain (CARDIA)    Difficulty of Paying Living Expenses: Not very hard  Food Insecurity: Food Insecurity Present (11/27/2022)   Hunger Vital Sign    Worried About Running Out of Food in the Last Year: Sometimes true    Ran Out of Food in the Last Year: Sometimes true  Transportation Needs: No Transportation Needs (11/29/2022)   PRAPARE - Administrator, Civil Service (Medical): No    Lack of Transportation (Non-Medical): No  Physical Activity: Not on file  Stress: Not on file  Social Connections: Not on file  Intimate Partner Violence: Not At Risk (11/27/2022)   Humiliation, Afraid, Rape, and Kick questionnaire    Fear of Current or Ex-Partner: No    Emotionally Abused: No    Physically Abused: No    Sexually Abused: No    FH:  Family History  Family history unknown: Yes    Past Medical History:  Diagnosis Date   Arthritis    Cerebral aneurysm    x2   Depression    Diabetes mellitus  Hypertension     Current Outpatient Medications  Medication Sig Dispense Refill   albuterol (VENTOLIN HFA) 108 (90 Base) MCG/ACT inhaler Inhale 1-2 puffs into the lungs as needed for wheezing or shortness of breath.     amiodarone (PACERONE) 200 MG tablet Take 1 tablet (200 mg total) by mouth daily. 30 tablet 3   apixaban (ELIQUIS) 5 MG TABS tablet Take 1 tablet (5 mg total) by mouth 2 (two) times daily. 60 tablet 1   atorvastatin (LIPITOR) 20 MG tablet Take 1 tablet (20 mg total) by mouth daily. 90 tablet 3   cholecalciferol (VITAMIN D) 1000 units tablet Take 2,000 Units by mouth daily. Gummies     furosemide  (LASIX) 80 MG tablet Take 1 tablet (80 mg total) by mouth 2 (two) times daily. 60 tablet 3   gabapentin (NEURONTIN) 300 MG capsule Take 300 mg by mouth at bedtime.     HYDROcodone-acetaminophen (NORCO/VICODIN) 5-325 MG tablet Take 1 tablet by mouth every 6 (six) hours as needed for severe pain (pain score 7-10). 20 tablet 0   JANUVIA 25 MG tablet Take 1 tablet (25 mg total) by mouth daily. 30 tablet 0   lidocaine (LIDODERM) 5 % Place 1 patch onto the skin daily. Remove & Discard patch within 12 hours or as directed by MD 30 patch 0   losartan (COZAAR) 25 MG tablet Take 1 tablet (25 mg total) by mouth daily. 90 tablet 3   metFORMIN (GLUCOPHAGE) 1000 MG tablet Take 1 tablet (1,000 mg total) by mouth 2 (two) times daily with a meal. 60 tablet 0   methocarbamol (ROBAXIN) 500 MG tablet Take 1 tablet (500 mg total) by mouth 3 (three) times daily. 90 tablet 0   metoprolol succinate (TOPROL-XL) 50 MG 24 hr tablet Take 1 tablet (50 mg total) by mouth daily. Take with or immediately following a meal. 30 tablet 1   OVER THE COUNTER MEDICATION Take 1 capsule by mouth daily. Omega XL     potassium chloride SA (KLOR-CON M) 20 MEQ tablet Take 1 tablet (20 mEq total) by mouth 2 (two) times daily. 60 tablet 0   senna-docusate (SENOKOT-S) 8.6-50 MG tablet Take 1 tablet by mouth 2 (two) times daily. 30 tablet 1   SPIRIVA HANDIHALER 18 MCG inhalation capsule Place 18 mcg into inhaler and inhale daily.     spironolactone (ALDACTONE) 25 MG tablet Take 1 tablet (25 mg total) by mouth daily. 30 tablet 0   No current facility-administered medications for this visit.    There were no vitals filed for this visit. Wt Readings from Last 3 Encounters:  11/30/22 128.2 kg (282 lb 9.6 oz)  10/12/22 125.7 kg (277 lb 3.2 oz)  10/06/22 124.6 kg (274 lb 9.6 oz)    PHYSICAL EXAM:  General:  Well appearing. No resp difficulty HEENT: normal Neck: supple. JVP flat. Carotids 2+ bilaterally; no bruits. No lymphadenopathy or  thryomegaly appreciated. Cor: PMI normal. Regular rate & rhythm. No rubs, gallops or murmurs. Lungs: clear Abdomen: soft, nontender, nondistended. No hepatosplenomegaly. No bruits or masses. Good bowel sounds. Extremities: no cyanosis, clubbing, rash, edema Neuro: alert & orientedx3, cranial nerves grossly intact. Moves all 4 extremities w/o difficulty. Affect pleasant.   ECG:   ASSESSMENT & PLAN: HFmrEF w/ RV failure -Echo 08/24: EF 49%, interventricular septum flattened in systole consistent with RV pressure overload, RV mildly reduced, RVSP 45 mmHg, mild BAE, moderate to severe TR, dilated IVC -NYGA  - Volume status  -Continue Toprol XL 50  mg daily -Continue Losartan 25 mg daily.  -Consider spiro next -Did not add SGLT2i d/t super morbid obesity and concern for GU infections -Treatment of OSA/OHS and weight loss are imperative   2. Atrial fibrillation/flutter -Suspect persistent since at least April 2024.  In SR [id number].  -Continue current dose of of amiodarone and metoprolol xl.  She has follow up in A fib clinic.    3. Tricuspid regurgitation -Moderate to severe on echo -Will need to follow   4. OSA/OHS Morbid obesity -Recommend referral for sleep study -Consider GLP-1 RA for weight loss   5. DM II -A1c 7.8%   Follow up

## 2022-12-14 ENCOUNTER — Encounter (HOSPITAL_COMMUNITY): Payer: Self-pay

## 2022-12-14 ENCOUNTER — Ambulatory Visit (HOSPITAL_COMMUNITY)
Admission: RE | Admit: 2022-12-14 | Discharge: 2022-12-14 | Disposition: A | Discharge status: Home / Self Care | Payer: Medicare HMO | Source: Ambulatory Visit | Attending: Family Medicine | Admitting: Family Medicine

## 2022-12-14 VITALS — BP 147/92 | HR 57 | Wt 300.0 lb

## 2022-12-14 DIAGNOSIS — Z87891 Personal history of nicotine dependence: Secondary | ICD-10-CM | POA: Diagnosis not present

## 2022-12-14 DIAGNOSIS — G4733 Obstructive sleep apnea (adult) (pediatric): Secondary | ICD-10-CM | POA: Diagnosis not present

## 2022-12-14 DIAGNOSIS — I4819 Other persistent atrial fibrillation: Secondary | ICD-10-CM | POA: Diagnosis not present

## 2022-12-14 DIAGNOSIS — I480 Paroxysmal atrial fibrillation: Secondary | ICD-10-CM | POA: Diagnosis not present

## 2022-12-14 DIAGNOSIS — I10 Essential (primary) hypertension: Secondary | ICD-10-CM

## 2022-12-14 DIAGNOSIS — J449 Chronic obstructive pulmonary disease, unspecified: Secondary | ICD-10-CM | POA: Insufficient documentation

## 2022-12-14 DIAGNOSIS — I5032 Chronic diastolic (congestive) heart failure: Secondary | ICD-10-CM

## 2022-12-14 DIAGNOSIS — E119 Type 2 diabetes mellitus without complications: Secondary | ICD-10-CM | POA: Diagnosis not present

## 2022-12-14 DIAGNOSIS — I5040 Unspecified combined systolic (congestive) and diastolic (congestive) heart failure: Secondary | ICD-10-CM | POA: Insufficient documentation

## 2022-12-14 DIAGNOSIS — Z7984 Long term (current) use of oral hypoglycemic drugs: Secondary | ICD-10-CM | POA: Diagnosis not present

## 2022-12-14 DIAGNOSIS — I4892 Unspecified atrial flutter: Secondary | ICD-10-CM | POA: Insufficient documentation

## 2022-12-14 DIAGNOSIS — I071 Rheumatic tricuspid insufficiency: Secondary | ICD-10-CM | POA: Diagnosis not present

## 2022-12-14 DIAGNOSIS — I110 Hypertensive heart disease with heart failure: Secondary | ICD-10-CM | POA: Insufficient documentation

## 2022-12-14 DIAGNOSIS — Z79899 Other long term (current) drug therapy: Secondary | ICD-10-CM | POA: Diagnosis not present

## 2022-12-14 LAB — BASIC METABOLIC PANEL
Anion gap: 9 (ref 5–15)
BUN: 13 mg/dL (ref 8–23)
CO2: 27 mmol/L (ref 22–32)
Calcium: 9.2 mg/dL (ref 8.9–10.3)
Chloride: 104 mmol/L (ref 98–111)
Creatinine, Ser: 0.98 mg/dL (ref 0.44–1.00)
GFR, Estimated: >60 mL/min (ref >60)
Glucose, Bld: 92 mg/dL (ref 70–99)
Potassium: 4.3 mmol/L (ref 3.5–5.1)
Sodium: 140 mmol/L (ref 135–145)

## 2022-12-14 MED ORDER — POTASSIUM CHLORIDE CRYS ER 20 MEQ PO TBCR: 20.0000 meq | EXTENDED_RELEASE_TABLET | Freq: Every day | ORAL | 1 refills | Status: DC

## 2022-12-14 MED ORDER — FUROSEMIDE 80 MG PO TABS: 40.0000 mg | ORAL_TABLET | Freq: Every day | ORAL | 3 refills | Status: DC

## 2022-12-14 MED ORDER — LOSARTAN POTASSIUM 50 MG PO TABS: 50.0000 mg | ORAL_TABLET | Freq: Every day | ORAL | 1 refills | Status: DC

## 2022-12-14 NOTE — Discharge Summary (Signed)
Physician Discharge Summary  Linda Chase KGM:[id number] DOB: [birth year] DOA: 11/27/2022  PCP: Rometta Emery, MD  Admit date: 11/27/2022 Discharge date: 11/30/2022  Time spent: 45 minutes  Recommendations for Outpatient Follow-up:  PCP in 1 week Needs Sleep study at [facility]/CHF clinic in 1 month   Discharge Diagnoses:  Principal Problem:   Acute on chronic right heart failure with acute respiratory failure with hypoxia Active Problems:   Thoracic back pain   Persistent atrial fibrillation (HCC)   OSA/OHS   Coronary artery disease involving native coronary artery of native heart without angina pectoris   Diabetes mellitus (HCC)   COPD (chronic obstructive pulmonary disease) (HCC)   Obesity hypoventilation syndrome (HCC)   Acute respiratory failure with hypoxia (HCC)   CHF (congestive heart failure) (HCC)   Discharge Condition: improved  Diet recommendation: DM heart healthy  Filed Weights   11/27/22 1710 11/29/22 0419 11/30/22 0343  Weight: 128 kg 129 kg 128.2 kg    History of present illness:  Linda Chase is an obese 67/F with history of diastolic CHF, right heart failure, COPD, untreated OSA/OHS, history of cerebral aneurysm status postcraniotomy and clipping, type 2 diabetes mellitus, PAF not on anticoagulation, hypertension, morbid obesity presented to the hospital with shortness of breath and low back pain.  History of intermittent low back pain for months worse with activity and movement.  In addition also reports shortness of breath with activity, poor compliance with diuretics in the setting of her back pain and problems urinating too much on Lasix especially with her back problems.  Admitted in 8/24 for right heart failure and respiratory failure, discharged on diuretics. -In the ED hypertensive, O2 sats 82% on RA, chest x-ray with cardiomegaly, pulmonary vascular congestion, CTA chest with no PE, trace right pleural effusion, mild mosaic attenuation in both  lungs can be seen in the setting of small airway disease and thoracic aortic aneurysm 4.1 cm, cardiomegaly with reflux of contrast in the IVC and hepatic veins suggestive of RV dysfunction  Hospital Course:  Acute on chronic diastolic CHF Pulmonary hypertension, RV failure -Last echo 8/24 with EF 50%, mild LVH, mild RV failure, Mod  to severe tricuspid regurg -Untreated OSA OHS contributing -Diuresed well on IV Lasix, changed to PO -Continue ARB, metoprolol, aldactone -Poor candidate for SGLT2i with morbid obesity and limited mobility -Emphasized BiPAP nightly, needs sleep study as outpatient -Needs follow-up in CHF clinic   Chronic thoracic back pain When admitted in August 2024 there was initial concern of possible discitis/OM; however, MRI could not be obtained due to aneurysm clipping and inability to get records.  -Blood cultures were negative, aspirate of the disc space negative for growth at that time.  -she had follow up with ID on 9/24 with labs and low suspicion for infection -CT thoracic spine few months ago and CTA chest now suggest DJD -Lidocaine patch, add scheduled Robaxin, continue Vicodin PRN-improving on this regimen -PT OT -Needs weight loss and core strengthening   Persistent atrial fibrillation (HCC) CHA2DS2-VASc score is 6  Saw atrial fib clinic on 10/1 where cardioversion was going to be scheduled, but she had missed an eliquis dose Continue amiodarone and eliquis, currently in sinus rhythm Continue metoprolol, dose decreased   OSA/OHS Has not been on a cpap or bipap. She was supposed to be discharged on bipap at last hospitalization -Ordered BiPAP nightly, needs referral for sleep study at discharge   Coronary artery disease involving native coronary artery of native heart without angina  pectoris Stable  Continue lipitor/eliquis.   Diabetes mellitus (HCC) A1c of 7.8 in August 2024 Continue januvia, hold metformin  SSI and accuchecks qac/hs    COPD  (chronic obstructive pulmonary disease) (HCC) No signs of exacerbation Continue spiriva and SABA prn     Discharge Exam: Vitals:   11/30/22 0630 11/30/22 0902  BP: (!) 157/91 (!) 157/72  Pulse:  60  Resp:  18  Temp:  98 F (36.7 C)  SpO2:  96%   General exam: Obese chronically ill female appears calm and comfortable, AAOx3 HEENT: Neck obese unable to assess JVD CVS: S1-S2, regular Lungs: Decreased breath sounds at the bases, otherwise clear Abdomen: Soft, nontender, bowel sounds present Extremities: trace edema Skin: No rashes Psychiatry:  Mood & affect appropriate.   Discharge Instructions   Discharge Instructions     Diet - low sodium heart healthy   Complete by: As directed    Diet Carb Modified   Complete by: As directed    Increase activity slowly   Complete by: As directed       Allergies as of 11/30/2022   No Known Allergies      Medication List     TAKE these medications    albuterol 108 (90 Base) MCG/ACT inhaler Commonly known as: VENTOLIN HFA Inhale 1-2 puffs into the lungs as needed for wheezing or shortness of breath.   amiodarone 200 MG tablet Commonly known as: PACERONE Take 1 tablet (200 mg total) by mouth daily.   apixaban 5 MG Tabs tablet Commonly known as: Eliquis Take 1 tablet (5 mg total) by mouth 2 (two) times daily.   atorvastatin 20 MG tablet Commonly known as: LIPITOR Take 1 tablet (20 mg total) by mouth daily.   cholecalciferol 1000 units tablet Commonly known as: VITAMIN D Take 2,000 Units by mouth daily. Gummies   gabapentin 300 MG capsule Commonly known as: NEURONTIN Take 300 mg by mouth at bedtime.   HYDROcodone-acetaminophen 5-325 MG tablet Commonly known as: NORCO/VICODIN Take 1 tablet by mouth every 6 (six) hours as needed for severe pain (pain score 7-10).   Januvia 25 MG tablet Generic drug: sitaGLIPtin Take 1 tablet (25 mg total) by mouth daily.   lidocaine 5 % Commonly known as: LIDODERM Place 1  patch onto the skin daily. Remove & Discard patch within 12 hours or as directed by MD   metFORMIN 1000 MG tablet Commonly known as: GLUCOPHAGE Take 1 tablet (1,000 mg total) by mouth 2 (two) times daily with a meal.   methocarbamol 500 MG tablet Commonly known as: ROBAXIN Take 1 tablet (500 mg total) by mouth 3 (three) times daily.   metoprolol succinate 50 MG 24 hr tablet Commonly known as: TOPROL-XL Take 1 tablet (50 mg total) by mouth daily. Take with or immediately following a meal. What changed:  medication strength how much to take   OVER THE COUNTER MEDICATION Take 1 capsule by mouth daily. Omega XL   Senna-S 8.6-50 MG tablet Generic drug: senna-docusate Take 1 tablet by mouth 2 (two) times daily.   Spiriva HandiHaler 18 MCG inhalation capsule Generic drug: tiotropium Place 18 mcg into inhaler and inhale daily.   spironolactone 25 MG tablet Commonly known as: ALDACTONE Take 1 tablet (25 mg total) by mouth daily.       No Known Allergies  Follow-up Information     [facility]. Go in 14 day(s).   Specialty: Cardiology Why: Hospital follow up 12/14/2022 @  1 : 30 pm PLEASE bring a current medication list to appointment FREE valet parking, Entrance C, off [employer] information: [address] [place] [address] [phone]        Rometta Emery, MD Follow up.   Specialty: Internal Medicine Contact information: [address] [place] [address] [phone]         [facility] Follow up.   Specialty: Home Health Services Why: Agency will call you to set up apt times Contact information: [address] [place] [address] [phone]                  The results of significant diagnostics from this hospitalization (including imaging, microbiology, ancillary and laboratory) are listed below for reference.    Significant  Diagnostic Studies: CT Angio Chest Pulmonary Embolism (PE) W or WO Contrast  Result Date: 11/27/2022 CLINICAL DATA:  Pulmonary embolism (PE) suspected, high prob EXAM: CT ANGIOGRAPHY CHEST WITH CONTRAST TECHNIQUE: Multidetector CT imaging of the chest was performed using the standard protocol during bolus administration of intravenous contrast. Multiplanar CT image reconstructions and MIPs were obtained to evaluate the vascular anatomy. RADIATION DOSE REDUCTION: This exam was performed according to the departmental dose-optimization program which includes automated exposure control, adjustment of the mA and/or kV according to patient size and/or use of iterative reconstruction technique. CONTRAST:  75mL OMNIPAQUE IOHEXOL 350 MG/ML SOLN COMPARISON:  CT 09/08/2022 FINDINGS: Cardiovascular: Satisfactory opacification of the pulmonary arteries. No pulmonary arterial filling defect to the segmental branch level to suggest pulmonary embolism. Main pulmonary trunk is dilated at 4.3 cm. Mid ascending thoracic aorta measures 4.1 cm in diameter, unchanged from prior. Scattered atherosclerotic vascular calcifications of the aorta and coronary arteries. Cardiomegaly. No pericardial effusion. Mediastinum/Nodes: No enlarged mediastinal, hilar, or axillary lymph nodes. Thyroid gland, trachea, and esophagus demonstrate no significant findings. Lungs/Pleura: Trace right pleural effusion. Mild mosaic attenuation both lung fields. No airspace consolidation. No pneumothorax. Upper Abdomen: Reflux of contrast into the IVC and hepatic veins. No acute abnormality. Musculoskeletal: Degenerative disc disease with endplate irregularity at T8-9 is unchanged in appearance compared to the prior CT without progressive endplate erode Review of the MIP images confirms the above findings. IMPRESSION: 1. No evidence of pulmonary embolism. 2. Trace right pleural effusion. 3. Mild mosaic attenuation both lung fields, which can be seen in the  setting of small airways disease. 4. Ascending thoracic aortic aneurysm measuring 4.1 cm in diameter. Recommend annual imaging followup by CTA or MRA. This recommendation follows 2010 ACCF/AHA/AATS/ACR/ASA/SCA/SCAI/SIR/STS/SVM Guidelines for the Diagnosis and Management of Patients with Thoracic Aortic Disease. Circulation. 2010; 121: U725-D664. Aortic aneurysm NOS (ICD10-I71.9) 5. Cardiomegaly with reflux of contrast into the IVC and hepatic veins, suggestive of right heart dysfunction. 6. Dilated main pulmonary trunk, which can be seen in the setting of pulmonary arterial hypertension. 7. Aortic atherosclerosis (ICD10-I70.0). Electronically Signed   By: Duanne Guess D.O.   On: 11/27/2022 11:26   CT T-SPINE NO CHARGE  Result Date: 11/27/2022 CLINICAL DATA:  Shortness of breath and back pain. Previous abnormal imaging appearance of T8-9 with disc aspiration on 09/16/2022 being negative for infection. EXAM: CT THORACIC SPINE WITH CONTRAST TECHNIQUE: Multiplanar CT images of the thoracic spine were reconstructed from contemporary CT of the Chest. RADIATION DOSE REDUCTION: This exam was performed according to the departmental dose-optimization program which includes automated exposure control, adjustment of the mA and/or kV according to patient size and/or use of iterative reconstruction technique. CONTRAST:  No additional. COMPARISON:  Thoracic spine CT 09/20/2022 FINDINGS: Alignment: Mild upper thoracic levoscoliosis. No significant listhesis. Vertebrae: Prominent endplate irregularity and sclerosis at T8-9 is unchanged from the prior CT without evidence of interval bone erosion. No acute fracture. Paraspinal and other soft tissues: Unchanged mild paravertebral soft tissue thickening at T8-9. No paraspinal fluid collection. Remainder of the chest reported separately. Disc levels: Diffuse thoracic disc degeneration. Prominent left-sided facet spurring in the upper thoracic spine with mild spinal stenosis at  T5-6. No evidence of high-grade spinal stenosis. IMPRESSION: No acute osseous abnormality. 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 Electronically Signed   By: Sebastian Ache M.D.   On: 11/27/2022 11:13   DG Chest Port 1 View  Result Date: 11/27/2022 CLINICAL DATA:  67 year old female with history of shortness of breath and chest pain. Back pain. EXAM: PORTABLE CHEST 1 VIEW COMPARISON:  Chest x-ray 09/09/2022. FINDINGS: Lung volumes are low. No confluent consolidative airspace disease. No pleural effusions. No pneumothorax. Cephalization of the pulmonary vasculature, without frank pulmonary edema. Heart size is mildly enlarged. Upper mediastinal contours are within normal limits. IMPRESSION: 1. Cardiomegaly with pulmonary venous congestion, but no frank pulmonary edema. Electronically Signed   By: Trudie Reed M.D.   On: 11/27/2022 06:40    Microbiology: No results found for this or any previous visit (from the past 240 hour(s)).   Labs: Basic Metabolic Panel: No results for input(s): "NA", "K", "CL", "CO2", "GLUCOSE", "BUN", "CREATININE", "CALCIUM", "MG", "PHOS" in the last 168 hours. Liver Function Tests: No results for input(s): "AST", "ALT", "ALKPHOS", "BILITOT", "PROT", "ALBUMIN" in the last 168 hours. No results for input(s): "LIPASE", "AMYLASE" in the last 168 hours. No results for input(s): "AMMONIA" in the last 168 hours. CBC: No results for input(s): "WBC", "NEUTROABS", "HGB", "HCT", "MCV", "PLT" in the last 168 hours. Cardiac Enzymes: No results for input(s): "CKTOTAL", "CKMB", "CKMBINDEX", "TROPONINI" in the last 168 hours. BNP: BNP (last 3 results) Recent Labs    09/09/22 1048 10/06/22 1256 11/27/22 0633  BNP 293.6* 152.3* 213.7*    ProBNP (last 3 results) No results for input(s): "PROBNP" in the last 8760 hours.  CBG: No results for input(s): "GLUCAP" in the last 168  hours.     Signed:  Zannie Cove MD.  [facility] 12/14/2022, 2:27 PM

## 2022-12-14 NOTE — Patient Instructions (Signed)
Medication Changes:  TAKE LASIX (FUROSEMIDE) 40MG  ONCE DAILY   TAKE POTASSIUM ONCE DAILY   START: INCREASED DOSE OF LOSARTAN- 50MG  DAILY   Lab Work:  Labs done today, your results will be available in MyChart, we will contact you for abnormal readings.  Referrals:  YOU HAVE BEEN REFERRED TO DR. Hoberg THEY WILL REACH OUT TO YOU OR CALL TO ARRANGE THIS. PLEASE CALL us WITH ANY CONCERNS   Follow-Up in: AS NEEDED   At the [facility], you and your health needs are our priority. We have a designated team specialized in the treatment of Heart Failure. This Care Team includes your primary Heart Failure Specialized Cardiologist (physician), Advanced Practice Providers (APPs- Physician Assistants and Nurse Practitioners), and Pharmacist who all work together to provide you with the care you need, when you need it.   You may see any of the following providers on your designated Care Team at your next follow up:  Dr. Arvilla Meres Dr. Marca Ancona Dr. Dorthula Nettles Dr. Theresia Bough Tonye Becket, NP Robbie Lis, [place] [facility] [place], [place] Brynda Peon, NP [place] Lee, NP Karle Plumber, PharmD   Please be sure to bring in all your medications bottles to every appointment.   Need to Contact us:  If you have any questions or concerns before your next appointment please send [place] a message through Martha or call our office at [phone].    TO LEAVE A MESSAGE FOR THE NURSE SELECT OPTION 2, PLEASE LEAVE A MESSAGE INCLUDING: YOUR NAME DATE OF BIRTH CALL BACK NUMBER REASON FOR CALL**this is important as we prioritize the call backs  YOU WILL RECEIVE A CALL BACK THE SAME DAY AS LONG AS YOU CALL BEFORE 4:00 PM

## 2023-01-03 ENCOUNTER — Other Ambulatory Visit (HOSPITAL_COMMUNITY): Payer: Self-pay | Admitting: *Deleted

## 2023-01-03 MED ORDER — AMIODARONE HCL 200 MG PO TABS: 200.0000 mg | ORAL_TABLET | Freq: Every day | ORAL | 1 refills | Status: DC

## 2023-01-19 ENCOUNTER — Ambulatory Visit (HOSPITAL_COMMUNITY): Payer: Medicare HMO | Admitting: Physician Assistant

## 2023-01-27 ENCOUNTER — Telehealth: Payer: Self-pay | Admitting: Internal Medicine

## 2023-01-27 ENCOUNTER — Ambulatory Visit: Payer: Medicare HMO | Admitting: Podiatry

## 2023-01-27 NOTE — Telephone Encounter (Signed)
Copied from CRM [id number]. Topic: General - Inquiry >> Jan 26, 2023  4:18 PM Mosetta Putt H wrote: Reason for CRM: need document from [facility] faxed back over

## 2023-02-17 ENCOUNTER — Encounter (HOSPITAL_BASED_OUTPATIENT_CLINIC_OR_DEPARTMENT_OTHER): Payer: Self-pay | Admitting: Family

## 2023-02-17 ENCOUNTER — Ambulatory Visit (HOSPITAL_BASED_OUTPATIENT_CLINIC_OR_DEPARTMENT_OTHER): Payer: Medicare HMO | Admitting: Family

## 2023-02-17 VITALS — BP 138/88 | HR 73 | Ht 64.0 in | Wt 275.8 lb

## 2023-02-17 DIAGNOSIS — I5022 Chronic systolic (congestive) heart failure: Secondary | ICD-10-CM | POA: Diagnosis not present

## 2023-02-17 DIAGNOSIS — I25118 Atherosclerotic heart disease of native coronary artery with other forms of angina pectoris: Secondary | ICD-10-CM

## 2023-02-17 DIAGNOSIS — I10 Essential (primary) hypertension: Secondary | ICD-10-CM

## 2023-02-17 DIAGNOSIS — D6859 Other primary thrombophilia: Secondary | ICD-10-CM

## 2023-02-17 DIAGNOSIS — Z79899 Other long term (current) drug therapy: Secondary | ICD-10-CM

## 2023-02-17 DIAGNOSIS — E785 Hyperlipidemia, unspecified: Secondary | ICD-10-CM

## 2023-02-17 NOTE — Patient Instructions (Signed)
 Recommend if you have questions or concerns about your medications, please call the office to ask and don't just stop your medications on your own.   Medication Instructions:   Resume Gabapentin   We will decide whether to resume Lasix  and Potassium based on your blood work   Labwork: Your physician recommends that you return for lab work today: CMP, CBC, TSH, BNP, direct LDL These will check your kidneys, liver, thyroid, cholesterol, and to make sure you are not holding onto extra fluid.    Testing/Procedures: Your physician has requested that you have a renal artery duplex. During this test, an ultrasound is used to evaluate blood flow to the kidneys. Allow one hour for this exam. Do not eat after midnight the day before and avoid carbonated beverages. Take your medications as you usually do.    Follow-Up: In 1 month

## 2023-02-17 NOTE — Progress Notes (Signed)
 [facility] Initial Assessment:    Date:  02/23/2023   ID:  Linda Chase, DOB 08/18/1955, MRN [id number]  PCP:  Sim Emery CROME, MD  Cardiologist:  Vina Gull, MD  Nephrologist:  Referring MD: Lenetta Greig BIRCH, NP   CC: Hypertension  History of Present Illness:    Linda Chase is a 68 y.o. female with a hx of PAF, HTN, morbid obesity, OSA, DM2, HTN, cerebral aneurysm s/p craniotomy and clipping, prior tobacco use having quit in 2023, atrial fibrillation, severe TR, COPD, CJD, CHF here to establish care in the [facility].   Admitted 08/2022 with acute respiratory failure with hypoxia and hypercapnea in setting of untreated OSA/OHS/CHF/Afib with RVR. Echo LVEF 50%, mildly reduced rVSR, severe TR. Amiodarone  required. Diuresed with IV Lasix  327 lbs ? 285 lbs. ID consulted for degenerative changes vs discitis/osteomyelitis at T8-T9 treated with abx until disc aspirate with no growth.   Last seen  12/14/23 by [facility]. Recommended to continue Lasix  40mg  daily, Toprol  50mg  daily, Losartan  increased to 50mg  daily, continue Spironolcatone 25mg  daily. SLGT2i deferred due to concern for GU infection. Afib suspected persistent since 04/2022 with EKG junctional rhythm, recommended ot follow up with Afib clinic and sleep study. She was referred back to general cardiology.   Linda Chase was diagnosed with hypertension in her 26s.  she reports tobacco use previously. She notes shortness of breath if she is rushing but not at rest. One puf of her inhaler seems to help.  She does use transportation to get to appointments. She does not have a scale at home. She has not taken her fluid medicine in one month as did not feel she needed it. Also notes self discontinuing her potassium a month ago. Reports no swelling in her feet. She doe sleep in a recliner since the hospitalization which is unchanged. Describes she mostly  sleeps in her chair due to back discomfort.   She saw [facility] 02/15/23 and was recommended for home sleep study.   Previous antihypertensives:  Secondary Causes of Hypertension  Medications/Herbal: OCP, steroids, stimulants, antidepressants, weight loss medication, immune suppressants, NSAIDs, sympathomimetics, alcohol, caffeine, licorice, ginseng, St. John's wort, chemo  Sleep Apnea Renal artery stenosis Hyperaldosteronism Hyper/hypothyroidism Pheochromocytoma: palpitations, tachycardia, headache, diaphoresis (plasma metanephrines) Cushing's syndrome: Cushingoid facies, central obesity, proximal muscle weakness, and ecchymoses, adrenal incidentaloma (cortisol) Coarctation of the aorta  Past Medical History:  Diagnosis Date   Arthritis    Cerebral aneurysm    x2   Depression    Diabetes mellitus    Hypertension     Past Surgical History:  Procedure Laterality Date   BRAIN SURGERY     double aneurysm   IR THORACIC DISC ASPIRATION W/IMG GUIDE  09/14/2022   IR THORACIC DISC ASPIRATION W/IMG GUIDE  09/16/2022   RADIOLOGY WITH ANESTHESIA N/A 09/16/2022   Procedure: IR thoracic disc aspiration;  Surgeon: de Macedo Rodrigues, Katyucia, MD;  Location: [facility] OR;  Service: Radiology;  Laterality: N/A;    Current Medications: Current Meds  Medication Sig   albuterol  (VENTOLIN  HFA) 108 (90 Base) MCG/ACT inhaler Inhale 1-2 puffs into the lungs as needed for wheezing or shortness of breath.   amiodarone  (PACERONE ) 200 MG tablet Take 1 tablet (200 mg total) by mouth daily.   apixaban  (ELIQUIS ) 5 MG TABS tablet Take 1 tablet (5 mg total) by mouth 2 (two) times daily.   atorvastatin  (LIPITOR) 20 MG tablet Take 1 tablet (20  mg total) by mouth daily.   cholecalciferol  (VITAMIN D ) 1000 units tablet Take 2,000 Units by mouth daily. Gummies   furosemide  (LASIX ) 80 MG tablet Take 0.5 tablets (40 mg total) by mouth daily.   gabapentin  (NEURONTIN ) 300 MG capsule Take 300 mg by mouth at  bedtime.   HYDROcodone -acetaminophen  (NORCO/VICODIN) 5-325 MG tablet Take 1 tablet by mouth every 6 (six) hours as needed for severe pain (pain score 7-10).   JANUVIA  25 MG tablet Take 1 tablet (25 mg total) by mouth daily.   lidocaine  (LIDODERM ) 5 % Place 1 patch onto the skin daily. Remove & Discard patch within 12 hours or as directed by MD   losartan  (COZAAR ) 50 MG tablet Take 1 tablet (50 mg total) by mouth daily.   metFORMIN  (GLUCOPHAGE ) 1000 MG tablet Take 1 tablet (1,000 mg total) by mouth 2 (two) times daily with a meal.   methocarbamol  (ROBAXIN ) 500 MG tablet Take 1 tablet (500 mg total) by mouth 3 (three) times daily.   OVER THE COUNTER MEDICATION Take 1 capsule by mouth daily. Omega XL   senna-docusate (SENOKOT-S) 8.6-50 MG tablet Take 1 tablet by mouth 2 (two) times daily.   SPIRIVA  HANDIHALER 18 MCG inhalation capsule Place 18 mcg into inhaler and inhale daily.   spironolactone  (ALDACTONE ) 25 MG tablet Take 1 tablet (25 mg total) by mouth daily.   [DISCONTINUED] potassium chloride  SA (KLOR-CON  M) 20 MEQ tablet Take 1 tablet (20 mEq total) by mouth daily.     Allergies:   Patient has no known allergies.   Social History   Socioeconomic History   Marital status: Legally Separated    Spouse name: Not on file   Number of children: 2   Years of education: Not on file   Highest education level: 11th grade  Occupational History   Occupation: disabled  Tobacco Use   Smoking status: Former    Types: Cigarettes   Smokeless tobacco: Never   Tobacco comments:    Former smoker 10/12/22  Vaping Use   Vaping status: Never Used  Substance and Sexual Activity   Alcohol use: No   Drug use: No   Sexual activity: Never  Other Topics Concern   Not on file  Social History Narrative   Not on file   Social Drivers of Health   Financial Resource Strain: Low Risk  (11/29/2022)   Overall Financial Resource Strain (CARDIA)    Difficulty of Paying Living Expenses: Not very hard  Food  Insecurity: Food Insecurity Present (11/27/2022)   Hunger Vital Sign    Worried About Running Out of Food in the Last Year: Sometimes true    Ran Out of Food in the Last Year: Sometimes true  Transportation Needs: No Transportation Needs (11/29/2022)   PRAPARE - Administrator, Civil Service (Medical): No    Lack of Transportation (Non-Medical): No  Physical Activity: Not on file  Stress: Not on file  Social Connections: Not on file     Family History: The patient's Family history is unknown by patient.  ROS:   Please see the history of present illness.     All other systems reviewed and are negative.  EKGs/Labs/Other Studies Reviewed:         Recent Labs: 11/27/2022: Magnesium 2.1 02/17/2023: ALT 11; BNP 71.5; BUN 10; Creatinine, Ser 0.91; Hemoglobin 11.9; Platelets 329; Potassium 4.9; Sodium 144; TSH 4.300   Recent Lipid Panel    Component Value Date/Time   CHOL 89 09/11/2022 0245  TRIG 75 09/11/2022 0245   HDL 42 09/11/2022 0245   CHOLHDL 2.1 09/11/2022 0245   VLDL 15 09/11/2022 0245   LDLCALC 32 09/11/2022 0245   LDLDIRECT 54 02/17/2023 1604    Physical Exam:   VS:  BP 138/88 (BP Location: Left Arm, Patient Position: Sitting, Cuff Size: Large)   Pulse 73   Ht 5' 4 (1.626 m)   Wt 275 lb 12.8 oz (125.1 kg)   SpO2 90%   BMI 47.34 kg/m  , BMI Body mass index is 47.34 kg/m. GENERAL:  Well appearing HEENT: Pupils equal round and reactive, fundi not visualized, oral mucosa unremarkable NECK:  No jugular venous distention, waveform within normal limits, carotid upstroke brisk and symmetric, no bruits, no thyromegaly LYMPHATICS:  No cervical adenopathy LUNGS:  Clear to auscultation bilaterally HEART:  RRR.  PMI not displaced or sustained,S1 and S2 within normal limits, no S3, no S4, no clicks, no rubs, no murmurs ABD:  Flat, positive bowel sounds normal in frequency in pitch, no bruits, no rebound, no guarding, no midline pulsatile mass, no hepatomegaly,  no splenomegaly EXT:  2 plus pulses throughout, no edema, no cyanosis no clubbing SKIN:  No rashes no nodules NEURO:  Cranial nerves II through XII grossly intact, motor grossly intact throughout PSYCH:  Cognitively intact, oriented to person place and time   ASSESSMENT/PLAN:    HFrEF with RV failure - Self discontinued potassium and lasix . Discussed need for diuretic in heart failure. Plan for CMP, BNP to assess renal function and volume status to determine dosing of Lasix . GDMT Lasix , Losartan , Metoprolol , Spironolactone .   HTN - Not at goal of <130/80 despite Losartan  50mg  daily, Toprol  40mg  daily, Spironolactone  25mg  daily. She has not been taking her Lasix  for the last month. Anticipate BP control will improve with resumption of diuretic. Plan for renal artery duplex for secondary hypertension workup.   Atrial fibrillation / flutter / Amiodarone  therapy / Hypercoagulable state - Persistent since 04/2022. Denies palpitations. Continue to follow with AFib clinic.  CHA2DS2-VASc Score = 6 [CHF History: 1, HTN History: 1, Diabetes History: 1, Stroke History: 0, Vascular Disease History: 1, Age Score: 1, Gender Score: 1].  Therefore, the patient's annual risk of stroke is 9.7 %.    Continue Eliquis  5mg  BID, denies bleeding complications, does not meet dose reduction criteria.  Amiodarone  monitoring: TSH, CMP today.   Severe TR - Monitor with periodic echocardiogram.   OSA/OHS - Has sleep study upcoming with [facility].  Morbid obesity - Weight loss via diet and exercise encouraged. Discussed the impact being overweight would have on cardiovascular risk. Consider possible GLP1 at follow up pending insurance coverage.  Screening for Secondary Hypertension:     Relevant Labs/Studies:    Latest Ref Rng & Units 02/17/2023    4:04 PM 12/14/2022    2:31 PM 11/30/2022    9:20 AM  Basic Labs  Sodium 134 - 144 mmol/L 144  140  138   Potassium 3.5 - 5.2 mmol/L 4.9  4.3  3.8   Creatinine  0.57 - 1.00 mg/dL 9.08  9.01  8.66        Latest Ref Rng & Units 02/17/2023    4:04 PM 09/06/2022    1:49 PM  Thyroid   TSH 0.450 - 4.500 uIU/mL 4.300  1.776                 02/17/2023    3:41 PM  Renovascular   Renal Artery US  Completed Yes  Disposition:    FU with MD/APP/PharmD in 1 month    Medication Adjustments/Labs and Tests Ordered: Current medicines are reviewed at length with the patient today.  Concerns regarding medicines are outlined above.  Orders Placed This Encounter  Procedures   Brain natriuretic peptide   Comprehensive metabolic panel   CBC   TSH   Direct LDL   VAS US  RENAL ARTERY DUPLEX   No orders of the defined types were placed in this encounter.    Signed, Reche GORMAN Finder, NP  02/23/2023 12:44 PM    [facility]

## 2023-02-18 LAB — COMPREHENSIVE METABOLIC PANEL WITH GFR
ALT: 11 IU/L (ref 0–32)
AST: 15 IU/L (ref 0–40)
Albumin: 4 g/dL (ref 3.9–4.9)
Alkaline Phosphatase: 85 IU/L (ref 44–121)
BUN/Creatinine Ratio: 11 — ABNORMAL LOW (ref 12–28)
BUN: 10 mg/dL (ref 8–27)
Bilirubin Total: 0.3 mg/dL (ref 0.0–1.2)
CO2: 27 mmol/L (ref 20–29)
Calcium: 9.5 mg/dL (ref 8.7–10.3)
Chloride: 102 mmol/L (ref 96–106)
Creatinine, Ser: 0.91 mg/dL (ref 0.57–1.00)
Globulin, Total: 3.9 g/dL (ref 1.5–4.5)
Glucose: 116 mg/dL — ABNORMAL HIGH (ref 70–99)
Potassium: 4.9 mmol/L (ref 3.5–5.2)
Sodium: 144 mmol/L (ref 134–144)
Total Protein: 7.9 g/dL (ref 6.0–8.5)
eGFR: 69 mL/min/1.73 (ref >59)

## 2023-02-18 LAB — CBC
Hematocrit: 37.7 % (ref 34.0–46.6)
Hemoglobin: 11.9 g/dL (ref 11.1–15.9)
MCH: 26.6 pg (ref 26.6–33.0)
MCHC: 31.6 g/dL (ref 31.5–35.7)
MCV: 84 fL (ref 79–97)
Platelets: 329 10*3/uL (ref 150–450)
RBC: 4.47 x10E6/uL (ref 3.77–5.28)
RDW: 12.6 % (ref 11.7–15.4)
WBC: 7.7 10*3/uL (ref 3.4–10.8)

## 2023-02-18 LAB — BRAIN NATRIURETIC PEPTIDE: BNP: 71.5 pg/mL (ref 0.0–100.0)

## 2023-02-18 LAB — LDL CHOLESTEROL, DIRECT: LDL Direct: 54 mg/dL (ref 0–99)

## 2023-02-18 LAB — TSH: TSH: 4.3 u[IU]/mL (ref 0.450–4.500)

## 2023-02-21 ENCOUNTER — Telehealth (HOSPITAL_BASED_OUTPATIENT_CLINIC_OR_DEPARTMENT_OTHER): Payer: Self-pay

## 2023-02-21 NOTE — Telephone Encounter (Addendum)
 Call attempted to patient, no answer, per DPR no messages to be left.    ----- Message from Clearnce Curia sent at 02/20/2023  4:24 PM EST ----- Normal kidneys, liver, electrolytes, thyroid function. CBC with no evidence of anemia nor infection.  BNP no volume overload. LDL (bad cholesterol) at goal.   To ensure volume status remains stable, resume Lasix  at dose of 20mg  daily (had not taken in 1 month). May remain off potassium (remove from med list). Repeat BMP in 1-2 weeks for monitoring.

## 2023-02-22 ENCOUNTER — Telehealth (HOSPITAL_BASED_OUTPATIENT_CLINIC_OR_DEPARTMENT_OTHER): Payer: Self-pay

## 2023-02-22 NOTE — Telephone Encounter (Signed)
-----   Message from Alver Sorrow sent at 02/20/2023  4:24 PM EST ----- Normal kidneys, liver, electrolytes, thyroid function. CBC with no evidence of anemia nor infection.  BNP no volume overload. LDL (bad cholesterol) at goal.   To ensure volume status remains stable, resume Lasix at dose of 20mg  daily (had not taken in 1 month). May remain off potassium (remove from med list). Repeat BMP in 1-2 weeks for monitoring.

## 2023-02-22 NOTE — Telephone Encounter (Signed)
2nd call attempt, no answer, per DPR no messages to be left.

## 2023-02-22 NOTE — Telephone Encounter (Signed)
2nd attempt, unable to leave VM, mailbox is full.

## 2023-02-23 ENCOUNTER — Encounter (HOSPITAL_BASED_OUTPATIENT_CLINIC_OR_DEPARTMENT_OTHER): Payer: Self-pay | Admitting: Family

## 2023-02-23 ENCOUNTER — Telehealth: Payer: Self-pay | Admitting: Internal Medicine

## 2023-02-23 NOTE — Telephone Encounter (Signed)
Tried calling, voicemail full

## 2023-02-23 NOTE — Telephone Encounter (Signed)
-----   Message from Alver Sorrow sent at 02/20/2023  4:24 PM EST ----- Normal kidneys, liver, electrolytes, thyroid function. CBC with no evidence of anemia nor infection.  BNP no volume overload. LDL (bad cholesterol) at goal.   To ensure volume status remains stable, resume Lasix at dose of 20mg  daily (had not taken in 1 month). May remain off potassium (remove from med list). Repeat BMP in 1-2 weeks for monitoring.

## 2023-02-23 NOTE — Telephone Encounter (Signed)
Patient is returning phone call in regard to lab results. Please advise.

## 2023-03-02 ENCOUNTER — Other Ambulatory Visit (HOSPITAL_COMMUNITY): Payer: Self-pay | Admitting: Adult Health

## 2023-03-03 NOTE — Telephone Encounter (Signed)
 Pt was prescribed Losartan 50mg  at the [facility] in Dec, 2024 for 2mos Pharmacy is requesting a refill, but I don't see where Dr. Tenny Craw ever prescribed this medication for her. Does Dr. Tenny Craw want pt to continue with this medication? Please advise. Thanks

## 2023-03-05 ENCOUNTER — Telehealth (HOSPITAL_BASED_OUTPATIENT_CLINIC_OR_DEPARTMENT_OTHER): Payer: Self-pay | Admitting: Emergency Medicine

## 2023-03-05 ENCOUNTER — Emergency Department (HOSPITAL_BASED_OUTPATIENT_CLINIC_OR_DEPARTMENT_OTHER): Payer: Medicare HMO | Admitting: Radiology

## 2023-03-05 ENCOUNTER — Emergency Department (HOSPITAL_BASED_OUTPATIENT_CLINIC_OR_DEPARTMENT_OTHER)
Admission: EM | Admit: 2023-03-05 | Discharge: 2023-03-05 | Disposition: A | Discharge status: Home / Self Care | Payer: Medicare HMO

## 2023-03-05 ENCOUNTER — Other Ambulatory Visit (HOSPITAL_BASED_OUTPATIENT_CLINIC_OR_DEPARTMENT_OTHER): Payer: Self-pay

## 2023-03-05 DIAGNOSIS — M546 Pain in thoracic spine: Secondary | ICD-10-CM | POA: Diagnosis not present

## 2023-03-05 DIAGNOSIS — G8929 Other chronic pain: Secondary | ICD-10-CM | POA: Diagnosis not present

## 2023-03-05 DIAGNOSIS — M549 Dorsalgia, unspecified: Secondary | ICD-10-CM | POA: Diagnosis present

## 2023-03-05 DIAGNOSIS — Z79899 Other long term (current) drug therapy: Secondary | ICD-10-CM | POA: Insufficient documentation

## 2023-03-05 LAB — URINALYSIS, ROUTINE W REFLEX MICROSCOPIC
Bilirubin Urine: NEGATIVE
Glucose, UA: NEGATIVE mg/dL
Hgb urine dipstick: NEGATIVE
Ketones, ur: NEGATIVE mg/dL
Leukocytes,Ua: NEGATIVE
Nitrite: NEGATIVE
Protein, ur: NEGATIVE mg/dL
Specific Gravity, Urine: 1.005 (ref 1.005–1.030)
pH: 7 (ref 5.0–8.0)

## 2023-03-05 MED ORDER — HYDROCODONE-ACETAMINOPHEN 5-325 MG PO TABS: 1.0000 | ORAL_TABLET | Freq: Four times a day (QID) | ORAL | 0 refills | Status: DC | PRN

## 2023-03-05 MED ORDER — HYDROCODONE-ACETAMINOPHEN 5-325 MG PO TABS
1.0000 | ORAL_TABLET | Freq: Four times a day (QID) | ORAL | 0 refills | Status: DC | PRN
  Filled 2023-03-05: qty 15, 4d supply, fill #0

## 2023-03-05 MED ORDER — METHOCARBAMOL 500 MG PO TABS
500.0000 mg | ORAL_TABLET | Freq: Three times a day (TID) | ORAL | 0 refills | Status: AC
  Filled 2023-03-05: qty 60, 20d supply, fill #0

## 2023-03-05 MED ORDER — OXYCODONE-ACETAMINOPHEN 5-325 MG PO TABS
1.0000 | ORAL_TABLET | Freq: Once | ORAL | Status: AC
  Administered 2023-03-05: 1 via ORAL
  Filled 2023-03-05: qty 1

## 2023-03-05 NOTE — Discharge Instructions (Addendum)
 Please use Tylenol for pain.  You may use 1000 mg of Tylenol every 6 hours.  Not to exceed 4 g of Tylenol within 24 hours.  You can use the stronger narcotic pain medication in place of Tylenol for severe break through pain.  If you take the narcotic pain medication that we prescribed, I recommend that you also take a laxative such as MiraLAX or Dulcolax every day that you take the narcotic pain medicine, and drink plenty of fluids, 50 to 64 ounces to prevent any constipation.  I have additionally refilled your home muscle relaxant.  Please contact either the orthopedic doctor or the neurosurgeon that I have placed their contact information on your discharge paperwork, if you need long-term pain medicine for your back pain or follow-up imaging such as an MRI you need to reach out to one of the specialist to schedule this imaging and discuss long-term pain management.

## 2023-03-05 NOTE — ED Triage Notes (Signed)
 Pt c/o lower back pain upon waking today. 1 g tylenol at 1100 with no relief, robaxin at 1200. Denies dysuria. Pt also reports rib pain

## 2023-03-05 NOTE — ED Provider Notes (Signed)
 [place] EMER[facility] DEPARTMENT AT [facility] Provider Note   CSN: [id number] Arrival date & time: 03/05/23  1245     History  Chief Complaint  Patient presents with   Back Pain    Linda Chase is a 68 y.o. female with past medical history significant for obesity, diabetes, depression, hypertension, paroxysmal A-fib, CHF presents with concern for lower to thoracic back pain upon waking today.  She denies any recent fall, injury.  She reports that she took Tylenol, Robaxin at home without significant relief.  She denies any dysuria, hematuria, loss control of bladder, bowels.  She also endorses some rib pain.  She reports that she has had good control with home Norco in the past, she reports that she has not followed up with orthopedics or neurosurgeon ever in the past.  T8 and T9 endplate spurring noted on previous CTs of the thoracic spine.   Back Pain      Home Medications Prior to Admission medications   Medication Sig Start Date End Date Taking? Authorizing Provider  HYDROcodone-acetaminophen (NORCO/VICODIN) 5-325 MG tablet Take 1 tablet by mouth every 6 (six) hours as needed. 03/05/23  Yes Jamiracle Avants H, PA-C  albuterol (VENTOLIN HFA) 108 (90 Base) MCG/ACT inhaler Inhale 1-2 puffs into the lungs as needed for wheezing or shortness of breath. 03/18/22   [provider]  amiodarone (PACERONE) 200 MG tablet Take 1 tablet (200 mg total) by mouth daily. 01/03/23   Fenton, Clint R, PA  apixaban (ELIQUIS) 5 MG TABS tablet Take 1 tablet (5 mg total) by mouth 2 (two) times daily. 10/06/22   Andrey Farmer, PA-C  atorvastatin (LIPITOR) 20 MG tablet Take 1 tablet (20 mg total) by mouth daily. 10/06/22   Andrey Farmer, PA-C  cholecalciferol (VITAMIN D) 1000 units tablet Take 2,000 Units by mouth daily. Gummies    [provider]  furosemide (LASIX) 80 MG tablet Take 0.5 tablets (40 mg total) by mouth daily. 12/14/22   Clegg, Amy D, NP   gabapentin (NEURONTIN) 300 MG capsule Take 300 mg by mouth at bedtime.    [provider]  JANUVIA 25 MG tablet Take 1 tablet (25 mg total) by mouth daily. 11/30/22   Zannie Cove, MD  lidocaine (LIDODERM) 5 % Place 1 patch onto the skin daily. Remove & Discard patch within 12 hours or as directed by MD 11/30/22   Zannie Cove, MD  losartan (COZAAR) 50 MG tablet TAKE 1 TABLET EVERY DAY 03/04/23   Alver Sorrow, NP  metFORMIN (GLUCOPHAGE) 1000 MG tablet Take 1 tablet (1,000 mg total) by mouth 2 (two) times daily with a meal. 11/30/22   Zannie Cove, MD  methocarbamol (ROBAXIN) 500 MG tablet Take 1 tablet (500 mg total) by mouth 3 (three) times daily. 03/05/23   Marguis Mathieson H, PA-C  metoprolol succinate (TOPROL-XL) 50 MG 24 hr tablet Take 1 tablet (50 mg total) by mouth daily. Take with or immediately following a meal. 11/30/22 12/30/22  Zannie Cove, MD  OVER THE COUNTER MEDICATION Take 1 capsule by mouth daily. Omega XL    [provider]  senna-docusate (SENOKOT-S) 8.6-50 MG tablet Take 1 tablet by mouth 2 (two) times daily. 11/30/22   Zannie Cove, MD  SPIRIVA HANDIHALER 18 MCG inhalation capsule Place 18 mcg into inhaler and inhale daily. 06/23/22   [provider]  spironolactone (ALDACTONE) 25 MG tablet Take 1 tablet (25 mg total) by mouth daily. 12/01/22   Zannie Cove, MD  Allergies    Patient has no known allergies.    Review of Systems   Review of Systems  Musculoskeletal:  Positive for back pain.  All other systems reviewed and are negative.   Physical Exam Updated Vital Signs BP 120/84   Pulse (!) 58   Temp 97.9 F (36.6 C)   Resp 20   Wt 127 kg   SpO2 93%   BMI 48.06 kg/m  Physical Exam Vitals and nursing note reviewed.  Constitutional:      General: She is not in acute distress.    Appearance: Normal appearance.  HENT:     Head: Normocephalic and atraumatic.  Eyes:     General:        Right eye: No  discharge.        Left eye: No discharge.  Cardiovascular:     Rate and Rhythm: Normal rate and regular rhythm.     Heart sounds: No murmur heard.    No friction rub. No gallop.  Pulmonary:     Effort: Pulmonary effort is normal.     Breath sounds: Normal breath sounds.  Abdominal:     General: Bowel sounds are normal.     Palpations: Abdomen is soft.  Musculoskeletal:     Comments: Tenderness to palpation in the thoracic paraspinous muscles, no significant midline tenderness.  Decreased strength globally 4 /5 out of 5 of lower extremities, 5/5 strength of upper extremities.  Secondary to pain/effort in lower extremities  Skin:    General: Skin is warm and dry.     Capillary Refill: Capillary refill takes less than 2 seconds.  Neurological:     Mental Status: She is alert and oriented to person, place, and time.  Psychiatric:        Mood and Affect: Mood normal.        Behavior: Behavior normal.     ED Results / Procedures / Treatments   Labs (all labs ordered are listed, but only abnormal results are displayed) Labs Reviewed  URINALYSIS, ROUTINE W REFLEX MICROSCOPIC - Abnormal; Notable for the following components:      Result Value   Color, Urine COLORLESS (*)    All other components within normal limits    EKG None  Radiology DG Ribs Bilateral W/Chest Result Date: 03/05/2023 CLINICAL DATA:  [id number] Acute pain [id number].  Back pain EXAM: BILATERAL RIBS AND CHEST - 4+ VIEW COMPARISON:  CT angio chest 11/27/2022, chest x-ray 09/08/2022 FINDINGS: The heart and mediastinal contours are unchanged. No focal consolidation. No pulmonary edema. No pleural effusion. No pneumothorax. No acute displaced right fracture or other bone lesions are seen involving the ribs. IMPRESSION: 1. No acute displaced right rib fracture. Please note, nondisplaced rib fractures may be occult on radiograph. 2. No acute cardiopulmonary abnormality. Electronically Signed   By: Tish Frederickson M.D.   On:  03/05/2023 13:53    Procedures Procedures    Medications Ordered in ED Medications  oxyCODONE-acetaminophen (PERCOCET/ROXICET) 5-325 MG per tablet 1 tablet (has no administration in time range)    ED Course/ Medical Decision Making/ A&P                                 Medical Decision Making Amount and/or Complexity of Data Reviewed Labs: ordered. Radiology: ordered.   Patient with back pain.  My emergent differential diagnosis includes slipped disc, compression fracture, spondylolisthesis, less clinical concern for epidural abscess or  osteomyelitis based on patient history.  Overall with high clinical suspicion for lumbar sprain, sciatica based on clinical presentation, risk factors.  No neurological deficits. Patient is ambulatory. No warning symptoms of back pain including: fecal incontinence, urinary retention or overflow incontinence, night sweats, waking from sleep with back pain, unexplained fevers or weight loss, h/o cancer, IVDU, recent trauma. Overall low clinical concern for cauda equina, epidural abscess, or other serious cause of back pain.  Given this work-up, evaluation, physical exam I do not believe that radiographic imaging is indicated at this time.  I independently interpreted plain film radiograph of the right ribs which shows no evidence of acute fracture, dislocation.  Conservative measures such as rest, ice/heat, Tylenol, and  prescription for Robaxin indicated with orthopedic follow-up if no improvement with conservative management.  Given her pain needs at baseline, history of chronic back pain and failure of Tylenol, Robaxin prescribed a short course of Norco, but discussed that for additional pain medicine prescriptions that she will need to follow-up with long-term care.  Extensive return precautions given, patient discharged in stable condition at this time.  Final Clinical Impression(s) / ED Diagnoses Final diagnoses:  Acute on chronic back pain    Rx / DC  Orders ED Discharge Orders          Ordered    methocarbamol (ROBAXIN) 500 MG tablet  3 times daily        03/05/23 1433    HYDROcodone-acetaminophen (NORCO/VICODIN) 5-325 MG tablet  Every 6 hours PRN        03/05/23 1433              Lasonja Lakins, Billingsley H, PA-C 03/05/23 1437    Coral Spikes, DO 03/05/23 1611

## 2023-03-07 ENCOUNTER — Other Ambulatory Visit (HOSPITAL_BASED_OUTPATIENT_CLINIC_OR_DEPARTMENT_OTHER): Payer: Self-pay

## 2023-03-11 ENCOUNTER — Encounter (HOSPITAL_BASED_OUTPATIENT_CLINIC_OR_DEPARTMENT_OTHER): Payer: Medicare HMO

## 2023-03-21 ENCOUNTER — Encounter (HOSPITAL_BASED_OUTPATIENT_CLINIC_OR_DEPARTMENT_OTHER): Payer: Medicare HMO | Admitting: Cardiovascular Disease

## 2023-03-21 ENCOUNTER — Other Ambulatory Visit: Payer: Self-pay

## 2023-03-21 ENCOUNTER — Emergency Department (HOSPITAL_BASED_OUTPATIENT_CLINIC_OR_DEPARTMENT_OTHER)
Admission: EM | Admit: 2023-03-21 | Discharge: 2023-03-21 | Disposition: A | Discharge status: Home / Self Care | Attending: Emergency Medicine | Admitting: Emergency Medicine

## 2023-03-21 ENCOUNTER — Other Ambulatory Visit (HOSPITAL_BASED_OUTPATIENT_CLINIC_OR_DEPARTMENT_OTHER): Payer: Self-pay

## 2023-03-21 DIAGNOSIS — E119 Type 2 diabetes mellitus without complications: Secondary | ICD-10-CM | POA: Insufficient documentation

## 2023-03-21 DIAGNOSIS — I509 Heart failure, unspecified: Secondary | ICD-10-CM | POA: Diagnosis not present

## 2023-03-21 DIAGNOSIS — K047 Periapical abscess without sinus: Secondary | ICD-10-CM | POA: Insufficient documentation

## 2023-03-21 DIAGNOSIS — K0889 Other specified disorders of teeth and supporting structures: Secondary | ICD-10-CM | POA: Diagnosis present

## 2023-03-21 DIAGNOSIS — Z7984 Long term (current) use of oral hypoglycemic drugs: Secondary | ICD-10-CM | POA: Insufficient documentation

## 2023-03-21 DIAGNOSIS — I2510 Atherosclerotic heart disease of native coronary artery without angina pectoris: Secondary | ICD-10-CM | POA: Diagnosis not present

## 2023-03-21 MED ORDER — PENICILLIN V POTASSIUM 500 MG PO TABS
500.0000 mg | ORAL_TABLET | Freq: Four times a day (QID) | ORAL | 0 refills | Status: AC
  Filled 2023-03-21: qty 40, 10d supply, fill #0

## 2023-03-21 MED ORDER — FLUCONAZOLE 150 MG PO TABS
150.0000 mg | ORAL_TABLET | ORAL | 0 refills | Status: AC | PRN
  Filled 2023-03-21: qty 2, 6d supply, fill #0

## 2023-03-21 MED ORDER — NAPROXEN 375 MG PO TABS
375.0000 mg | ORAL_TABLET | Freq: Two times a day (BID) | ORAL | 0 refills | Status: DC
  Filled 2023-03-21: qty 20, 10d supply, fill #0

## 2023-03-21 MED ORDER — ACETAMINOPHEN 500 MG PO TABS
500.0000 mg | ORAL_TABLET | Freq: Four times a day (QID) | ORAL | 0 refills | Status: AC | PRN
  Filled 2023-03-21: qty 30, 8d supply, fill #0

## 2023-03-21 NOTE — ED Provider Notes (Addendum)
 [place] [facility]ERGENC[facility] DEPARTMENT AT [facility] Provider Note   CSN: [id number] Arrival date & time: 03/21/23  1313     History  Chief Complaint  Patient presents with   Dental Pain    Linda Chase is a 68 y.o. female.  HPI    Pt comes in with cc of toothache, left lower incisor for 1 week. Has hx of AF, CAD, CHF.  Denies n/v/f/c. Pain persistent, doesn't have dental insurance.   Home Medications Prior to Admission medications   Medication Sig Start Date End Date Taking? Authorizing Provider  acetaminophen (TYLENOL) 500 MG tablet Take 1 tablet (500 mg total) by mouth every 6 (six) hours as needed. 03/21/23  Yes Derwood Kaplan, MD  fluconazole (DIFLUCAN) 150 MG tablet Take 1 tablet (150 mg total) by mouth every 3 (three) days as needed for up to 2 doses (prn yeat infection). 03/21/23  Yes Derwood Kaplan, MD  naproxen (NAPROSYN) 375 MG tablet Take 1 tablet (375 mg total) by mouth 2 (two) times daily. 03/21/23  Yes Derwood Kaplan, MD  penicillin v potassium (VEETID) 500 MG tablet Take 1 tablet (500 mg total) by mouth 4 (four) times daily for 10 days. 03/21/23 03/31/23 Yes Derwood Kaplan, MD  albuterol (VENTOLIN HFA) 108 (90 Base) MCG/ACT inhaler Inhale 1-2 puffs into the lungs as needed for wheezing or shortness of breath. 03/18/22   [provider]  amiodarone (PACERONE) 200 MG tablet Take 1 tablet (200 mg total) by mouth daily. 01/03/23   Fenton, Clint R, PA  apixaban (ELIQUIS) 5 MG TABS tablet Take 1 tablet (5 mg total) by mouth 2 (two) times daily. 10/06/22   Andrey Farmer, PA-C  atorvastatin (LIPITOR) 20 MG tablet Take 1 tablet (20 mg total) by mouth daily. 10/06/22   Andrey Farmer, PA-C  cholecalciferol (VITAMIN D) 1000 units tablet Take 2,000 Units by mouth daily. Gummies    [provider]  furosemide (LASIX) 80 MG tablet Take 0.5 tablets (40 mg total) by mouth daily. 12/14/22   Clegg, Amy D, NP  gabapentin (NEURONTIN) 300 MG  capsule Take 300 mg by mouth at bedtime.    [provider]  HYDROcodone-acetaminophen (NORCO/VICODIN) 5-325 MG tablet Take 1 tablet by mouth every 6 (six) hours as needed. 03/05/23   Prosperi, Christian H, PA-C  HYDROcodone-acetaminophen (NORCO/VICODIN) 5-325 MG tablet Take 1 tablet by mouth every 6 (six) hours as needed. 03/05/23   Prosperi, Christian H, PA-C  JANUVIA 25 MG tablet Take 1 tablet (25 mg total) by mouth daily. 11/30/22   Zannie Cove, MD  lidocaine (LIDODERM) 5 % Place 1 patch onto the skin daily. Remove & Discard patch within 12 hours or as directed by MD 11/30/22   Zannie Cove, MD  losartan (COZAAR) 50 MG tablet TAKE 1 TABLET EVERY DAY 03/04/23   Alver Sorrow, NP  metFORMIN (GLUCOPHAGE) 1000 MG tablet Take 1 tablet (1,000 mg total) by mouth 2 (two) times daily with a meal. 11/30/22   Zannie Cove, MD  methocarbamol (ROBAXIN) 500 MG tablet Take 1 tablet (500 mg total) by mouth 3 (three) times daily. 03/05/23   Prosperi, Christian H, PA-C  metoprolol succinate (TOPROL-XL) 50 MG 24 hr tablet Take 1 tablet (50 mg total) by mouth daily. Take with or immediately following a meal. 11/30/22 12/30/22  Zannie Cove, MD  OVER THE COUNTER MEDICATION Take 1 capsule by mouth daily. Omega XL    [provider]  senna-docusate (SENOKOT-S) 8.6-50 MG tablet Take 1 tablet  by mouth 2 (two) times daily. 11/30/22   Zannie Cove, MD  SPIRIVA HANDIHALER 18 MCG inhalation capsule Place 18 mcg into inhaler and inhale daily. 06/23/22   [provider]  spironolactone (ALDACTONE) 25 MG tablet Take 1 tablet (25 mg total) by mouth daily. 12/01/22   Zannie Cove, MD      Allergies    Patient has no known allergies.    Review of Systems   Review of Systems  All other systems reviewed and are negative.   Physical Exam Updated Vital Signs BP (!) 177/78   Pulse (!) 59   Temp 97.6 F (36.4 C)   Resp 20   SpO2 95%  Physical Exam Vitals and nursing note  reviewed.  Constitutional:      Appearance: She is well-developed.  HENT:     Head: Atraumatic.     Mouth/Throat:     Comments: Pt has no trismus, stridor or crepitus over the neck. Pt has poor dentition, hyperpigmentation of gingiva and tenderness to palpation at the bottom of L incisor, but there is no fluctuance. Mild edema noted.   Cardiovascular:     Rate and Rhythm: Normal rate.  Pulmonary:     Effort: Pulmonary effort is normal.  Musculoskeletal:     Cervical back: Normal range of motion and neck supple.  Skin:    General: Skin is warm and dry.  Neurological:     Mental Status: She is alert and oriented to person, place, and time.     ED Results / Procedures / Treatments   Labs (all labs ordered are listed, but only abnormal results are displayed) Labs Reviewed - No data to display  EKG None  Radiology No results found.  Procedures Procedures    Medications Ordered in ED Medications - No data to display  ED Course/ Medical Decision Making/ A&P                                 Medical Decision Making Patient comes in with chief complaint of toothache. Pertinent history - immunocompetent, but is diabetic and has multiple missing teeth.  DDx considered includes: - Periapical tooth infection - Dental abscess - Gingivitis - Dental trauma - Pulpitis - Nerve root compression  Patient is nontoxic-appearing and exam is overall reassuring.  No hard signs for deep space infection such as Ludwig's angina.  No trismus, drooling.  No indication for imaging at this time.  Plan is to initiate antibiotics and as needed analgesia has been prescribed for supportive reasons.  Patient has been advised to follow-up with dentist for definitive care and return to the ER if there is worsening pain despite supportive treatment, swelling, facial swelling, drooling, difficulty breathing, difficulty opening mouth, fevers, chills, confusion, headaches, seizures.    Problems  Addressed: Dental infection: acute illness or injury  Risk OTC drugs. Prescription drug management.    Final Clinical Impression(s) / ED Diagnoses Final diagnoses:  Dental infection    Rx / DC Orders ED Discharge Orders          Ordered    penicillin v potassium (VEETID) 500 MG tablet  4 times daily        03/21/23 1551    naproxen (NAPROSYN) 375 MG tablet  2 times daily        03/21/23 1551    acetaminophen (TYLENOL) 500 MG tablet  Every 6 hours PRN  03/21/23 1551    fluconazole (DIFLUCAN) 150 MG tablet  Every 72 hours PRN        03/21/23 1551               Derwood Kaplan, MD 03/21/23 1556

## 2023-03-21 NOTE — ED Triage Notes (Signed)
 Lower left incisor pain-x1 week. Afebrile. Denies N/V/D/ Cp, SOB, abd pain.

## 2023-03-21 NOTE — Discharge Instructions (Addendum)
 We saw you in the ER for the toothache. No evidence of deep infection, no evidence of pus that can be drained out. YOU WILL NEED TO SEE A DENTIST to get appropriate care. Please take the antibiotics and pain meds provided in the interval.  Return to the ER if there is drooling, difficulty breathing, difficulty opening mouth, fevers, chills, confusion, headaches, seizures.

## 2023-03-21 NOTE — Progress Notes (Deleted)
 Advanced Hypertension Clinic Follow Up:    Date:  03/21/2023   ID:  Linda Chase, DOB 27-May-1955, MRN [id number]  PCP:  Rometta Emery, MD  Cardiologist:  Dietrich Pates, MD  Nephrologist:  Referring MD: Rometta Emery, MD   CC: Hypertension  History of Present Illness:    Linda Chase is a 68 y.o. female with a hx of PAF, hypertension, diabetes, morbid obesity, OSA, cerebral aneurysm status postcraniotomy and clipping, prior tobacco abuse, PAF, severe TR, COPD, and HFpEF here for follow-up.  She first establish care in the advanced hypertension clinic 02/2023.  She was admitted 08/2022 with hypercarbic and hypoxic respiratory failure due to OSA/OHS, HFpEF, atrial fibrillation with RVR.  Echo at the time revealed LVEF 50% with moderately reduced right ventricular function and severe TR.  She started on amiodarone and diuresis with IV Lasix from 327 to 285 lb.    She has follow-up at the heart failure team.  Losartan was increased and she was continued on spironolactone.  At her visit with Luther Parody she was awaiting a home sleep study.  Blood pressure in the office was 138/88.  She had not been taking her Lasix for the preceding month. BNP was 71.5.    Previous antihypertensives:  Secondary Causes of Hypertension  Medications/Herbal: OCP, steroids, stimulants, antidepressants, weight loss medication, immune suppressants, NSAIDs, sympathomimetics, alcohol, caffeine, licorice, ginseng, St. John's wort, chemo  Sleep Apnea Renal artery stenosis Hyperaldosteronism Hyper/hypothyroidism Pheochromocytoma: palpitations, tachycardia, headache, diaphoresis (plasma metanephrines) Cushing's syndrome: Cushingoid facies, central obesity, proximal muscle weakness, and ecchymoses, adrenal incidentaloma (cortisol) Coarctation of the aorta  Past Medical History:  Diagnosis Date   Arthritis    Cerebral aneurysm    x2   Depression    Diabetes mellitus    Hypertension     Past  Surgical History:  Procedure Laterality Date   BRAIN SURGERY     double aneurysm   IR THORACIC DISC ASPIRATION W/IMG GUIDE  09/14/2022   IR THORACIC DISC ASPIRATION W/IMG GUIDE  09/16/2022   RADIOLOGY WITH ANESTHESIA N/A 09/16/2022   Procedure: IR thoracic disc aspiration;  Surgeon: Baldemar Lenis, MD;  Location: [facility] OR;  Service: Radiology;  Laterality: N/A;    Current Medications: No outpatient medications have been marked as taking for the 03/21/23 encounter (Appointment) with Chilton Si, MD.     Allergies:   Patient has no known allergies.   Social History   Socioeconomic History   Marital status: Legally Separated    Spouse name: Not on file   Number of children: 2   Years of education: Not on file   Highest education level: 11th grade  Occupational History   Occupation: disabled  Tobacco Use   Smoking status: Former    Types: Cigarettes   Smokeless tobacco: Never   Tobacco comments:    Former smoker 10/12/22  Vaping Use   Vaping status: Never Used  Substance and Sexual Activity   Alcohol use: No   Drug use: No   Sexual activity: Never  Other Topics Concern   Not on file  Social History Narrative   Not on file   Social Drivers of Health   Financial Resource Strain: Low Risk  (11/29/2022)   Overall Financial Resource Strain (CARDIA)    Difficulty of Paying Living Expenses: Not very hard  Food Insecurity: Food Insecurity Present (11/27/2022)   Hunger Vital Sign    Worried About Running Out of Food in the Last Year: Sometimes true  Ran Out of Food in the Last Year: Sometimes true  Transportation Needs: No Transportation Needs (11/29/2022)   PRAPARE - Administrator, Civil Service (Medical): No    Lack of Transportation (Non-Medical): No  Physical Activity: Not on file  Stress: Not on file  Social Connections: Not on file     Family History: The patient's ***Family history is unknown by patient.  ROS:   Please see the  history of present illness.    *** All other systems reviewed and are negative.  EKGs/Labs/Other Studies Reviewed:    EKG:  EKG is *** ordered today.  The ekg ordered today demonstrates ***  Recent Labs: 11/27/2022: Magnesium 2.1 02/17/2023: ALT 11; BNP 71.5; BUN 10; Creatinine, Ser 0.91; Hemoglobin 11.9; Platelets 329; Potassium 4.9; Sodium 144; TSH 4.300   Recent Lipid Panel    Component Value Date/Time   CHOL 89 09/11/2022 0245   TRIG 75 09/11/2022 0245   HDL 42 09/11/2022 0245   CHOLHDL 2.1 09/11/2022 0245   VLDL 15 09/11/2022 0245   LDLCALC 32 09/11/2022 0245   LDLDIRECT 54 02/17/2023 1604    Physical Exam:   VS:  There were no vitals taken for this visit. , BMI There is no height or weight on file to calculate BMI. GENERAL:  Well appearing HEENT: Pupils equal round and reactive, fundi not visualized, oral mucosa unremarkable NECK:  No jugular venous distention, waveform within normal limits, carotid upstroke brisk and symmetric, no bruits, no thyromegaly LYMPHATICS:  No cervical adenopathy LUNGS:  Clear to auscultation bilaterally HEART:  RRR.  PMI not displaced or sustained,S1 and S2 within normal limits, no S3, no S4, no clicks, no rubs, *** murmurs ABD:  Flat, positive bowel sounds normal in frequency in pitch, no bruits, no rebound, no guarding, no midline pulsatile mass, no hepatomegaly, no splenomegaly EXT:  2 plus pulses throughout, no edema, no cyanosis no clubbing SKIN:  No rashes no nodules NEURO:  Cranial nerves II through XII grossly intact, motor grossly intact throughout PSYCH:  Cognitively intact, oriented to person place and time   ASSESSMENT/PLAN:    No problem-specific Assessment & Plan notes found for this encounter.   Screening for Secondary Hypertension: { Click here to document screening for secondary causes of HTN  :[id number]}    Relevant Labs/Studies:    Latest Ref Rng & Units 02/17/2023    4:04 PM 12/14/2022    2:31 PM 11/30/2022    9:20 AM   Basic Labs  Sodium 134 - 144 mmol/L 144  140  138   Potassium 3.5 - 5.2 mmol/L 4.9  4.3  3.8   Creatinine 0.57 - 1.00 mg/dL 4.09  8.11  9.14        Latest Ref Rng & Units 02/17/2023    4:04 PM 09/06/2022    1:49 PM  Thyroid   TSH 0.450 - 4.500 uIU/mL 4.300  1.776                 02/17/2023    3:41 PM  Renovascular   Renal Artery [place] Completed Yes        Disposition:    FU with MD/PharmD in {gen number [id number]} {Days to years:10300}    Medication Adjustments/Labs and Tests Ordered: Current medicines are reviewed at length with the patient today.  Concerns regarding medicines are outlined above.  No orders of the defined types were placed in this encounter.  No orders of the defined types were placed in this encounter.  Signed, Chilton Si, MD  03/21/2023 1:33 PM    [facility]

## 2023-03-27 NOTE — Telephone Encounter (Cosign Needed)
 Pharmacy for Rx changed

## 2023-03-29 ENCOUNTER — Encounter (HOSPITAL_BASED_OUTPATIENT_CLINIC_OR_DEPARTMENT_OTHER): Payer: Self-pay | Admitting: Internal Medicine

## 2023-05-17 ENCOUNTER — Encounter (HOSPITAL_BASED_OUTPATIENT_CLINIC_OR_DEPARTMENT_OTHER): Payer: Self-pay | Admitting: Emergency Medicine

## 2023-05-17 ENCOUNTER — Emergency Department (HOSPITAL_BASED_OUTPATIENT_CLINIC_OR_DEPARTMENT_OTHER)
Admission: EM | Admit: 2023-05-17 | Discharge: 2023-05-17 | Disposition: A | Discharge status: Home / Self Care | Attending: Emergency Medicine | Admitting: Emergency Medicine

## 2023-05-17 ENCOUNTER — Other Ambulatory Visit: Payer: Self-pay

## 2023-05-17 ENCOUNTER — Emergency Department (HOSPITAL_BASED_OUTPATIENT_CLINIC_OR_DEPARTMENT_OTHER)

## 2023-05-17 ENCOUNTER — Other Ambulatory Visit (HOSPITAL_BASED_OUTPATIENT_CLINIC_OR_DEPARTMENT_OTHER): Payer: Self-pay

## 2023-05-17 DIAGNOSIS — E119 Type 2 diabetes mellitus without complications: Secondary | ICD-10-CM | POA: Insufficient documentation

## 2023-05-17 DIAGNOSIS — I5030 Unspecified diastolic (congestive) heart failure: Secondary | ICD-10-CM | POA: Diagnosis not present

## 2023-05-17 DIAGNOSIS — Z7951 Long term (current) use of inhaled steroids: Secondary | ICD-10-CM | POA: Diagnosis not present

## 2023-05-17 DIAGNOSIS — I2510 Atherosclerotic heart disease of native coronary artery without angina pectoris: Secondary | ICD-10-CM | POA: Diagnosis not present

## 2023-05-17 DIAGNOSIS — R109 Unspecified abdominal pain: Secondary | ICD-10-CM | POA: Diagnosis not present

## 2023-05-17 DIAGNOSIS — I110 Hypertensive heart disease with heart failure: Secondary | ICD-10-CM | POA: Diagnosis not present

## 2023-05-17 DIAGNOSIS — Z7984 Long term (current) use of oral hypoglycemic drugs: Secondary | ICD-10-CM | POA: Insufficient documentation

## 2023-05-17 DIAGNOSIS — J449 Chronic obstructive pulmonary disease, unspecified: Secondary | ICD-10-CM | POA: Insufficient documentation

## 2023-05-17 DIAGNOSIS — Z79899 Other long term (current) drug therapy: Secondary | ICD-10-CM | POA: Insufficient documentation

## 2023-05-17 DIAGNOSIS — M545 Low back pain, unspecified: Secondary | ICD-10-CM | POA: Diagnosis present

## 2023-05-17 DIAGNOSIS — I480 Paroxysmal atrial fibrillation: Secondary | ICD-10-CM | POA: Diagnosis not present

## 2023-05-17 DIAGNOSIS — Z7901 Long term (current) use of anticoagulants: Secondary | ICD-10-CM | POA: Insufficient documentation

## 2023-05-17 LAB — CBC WITH DIFFERENTIAL/PLATELET
Abs Immature Granulocytes: 0.04 10*3/uL (ref 0.00–0.07)
Basophils Absolute: 0 10*3/uL (ref 0.0–0.1)
Basophils Relative: 0 %
Eosinophils Absolute: 0.2 10*3/uL (ref 0.0–0.5)
Eosinophils Relative: 2 %
HCT: 40.1 % (ref 36.0–46.0)
Hemoglobin: 12.1 g/dL (ref 12.0–15.0)
Immature Granulocytes: 1 %
Lymphocytes Relative: 13 %
Lymphs Abs: 1.1 10*3/uL (ref 0.7–4.0)
MCH: 27.1 pg (ref 26.0–34.0)
MCHC: 30.2 g/dL (ref 30.0–36.0)
MCV: 89.9 fL (ref 80.0–100.0)
Monocytes Absolute: 0.6 10*3/uL (ref 0.1–1.0)
Monocytes Relative: 7 %
Neutro Abs: 6.5 10*3/uL (ref 1.7–7.7)
Neutrophils Relative %: 77 %
Platelets: 238 10*3/uL (ref 150–400)
RBC: 4.46 MIL/uL (ref 3.87–5.11)
RDW: 13.6 % (ref 11.5–15.5)
WBC: 8.4 10*3/uL (ref 4.0–10.5)
nRBC: 0 % (ref 0.0–0.2)

## 2023-05-17 LAB — BASIC METABOLIC PANEL WITH GFR
Anion gap: 11 (ref 5–15)
BUN: 15 mg/dL (ref 8–23)
CO2: 28 mmol/L (ref 22–32)
Calcium: 9.8 mg/dL (ref 8.9–10.3)
Chloride: 102 mmol/L (ref 98–111)
Creatinine, Ser: 1.07 mg/dL — ABNORMAL HIGH (ref 0.44–1.00)
GFR, Estimated: 57 mL/min — ABNORMAL LOW (ref >60)
Glucose, Bld: 127 mg/dL — ABNORMAL HIGH (ref 70–99)
Potassium: 5.1 mmol/L (ref 3.5–5.1)
Sodium: 141 mmol/L (ref 135–145)

## 2023-05-17 LAB — URINALYSIS, ROUTINE W REFLEX MICROSCOPIC
Bilirubin Urine: NEGATIVE
Glucose, UA: NEGATIVE mg/dL
Hgb urine dipstick: NEGATIVE
Ketones, ur: NEGATIVE mg/dL
Leukocytes,Ua: NEGATIVE
Nitrite: NEGATIVE
Protein, ur: NEGATIVE mg/dL
Specific Gravity, Urine: 1.017 (ref 1.005–1.030)
pH: 6 (ref 5.0–8.0)

## 2023-05-17 MED ORDER — MORPHINE SULFATE (PF) 4 MG/ML IV SOLN
4.0000 mg | Freq: Once | INTRAVENOUS | Status: AC
  Administered 2023-05-17: 4 mg via INTRAVENOUS
  Filled 2023-05-17: qty 1

## 2023-05-17 MED ORDER — HYDROCODONE-ACETAMINOPHEN 5-325 MG PO TABS
1.0000 | ORAL_TABLET | Freq: Four times a day (QID) | ORAL | 0 refills | Status: AC | PRN
  Filled 2023-05-17: qty 8, 2d supply, fill #0

## 2023-05-17 MED ORDER — IOHEXOL 300 MG/ML  SOLN
100.0000 mL | Freq: Once | INTRAMUSCULAR | Status: AC | PRN
  Administered 2023-05-17: 100 mL via INTRAVENOUS

## 2023-05-17 MED ORDER — ONDANSETRON HCL 4 MG/2ML IJ SOLN
4.0000 mg | Freq: Once | INTRAMUSCULAR | Status: AC
  Administered 2023-05-17: 4 mg via INTRAVENOUS
  Filled 2023-05-17: qty 2

## 2023-05-17 NOTE — ED Provider Notes (Signed)
 [place] EMERGENCY DEPARTMENT AT [facility] Provider Note   CSN: [id number] Arrival date & time: 05/17/23  6644     History  Chief Complaint  Patient presents with   Back Pain    Linda Chase is a 68 y.o. female.  Patient with h/o hypertension, paroxysmal atrial fibrillation on Eliquis , type 2 diabetes, cerebral aneurysm s/p craniotomy and clipping, COPD, sleep apnea, CAD, HFpEF --presents to the emergency department for evaluation of lower back pain, worse with movement, with radiation to the right flank.  Symptoms started yesterday evening.  She denies any new falls or injuries. Patient denies warning symptoms of back pain including: fecal incontinence, urinary retention or overflow incontinence, night sweats, waking from sleep with back pain, unexplained fevers or weight loss, h/o cancer, IVDU, recent trauma.  Patient has had issues with back pain in the past as well as sciatica.  This feels somewhat different to her as she is not having any pain rating down her leg.  She states that in the past, pain has been improved with hydrocodone .  Currently she has a Lidoderm  patch in place and took muscle relaxers at home, neither of which have helped a whole lot.  Patient did have a question of a thoracic spine discitis/osteomyelitis in August/September 2024.  Patient was unable to have MRI done due to unclear compatibility of previous cerebral aneurysm clipping in 2006.  Per infectious disease notes, thought more likely be degenerative in nature as she improved without antibiotics (note Sept 2024).  No fever, chest pain, shortness of breath at current time.        Home Medications Prior to Admission medications   Medication Sig Start Date End Date Taking? Authorizing Provider  acetaminophen  (TYLENOL ) 500 MG tablet Take 1 tablet (500 mg total) by mouth every 6 (six) hours as needed. 03/21/23   Deatra Face, MD  albuterol  (VENTOLIN  HFA) 108 (90 Base) MCG/ACT inhaler Inhale  1-2 puffs into the lungs as needed for wheezing or shortness of breath. 03/18/22   [provider]  amiodarone  (PACERONE ) 200 MG tablet Take 1 tablet (200 mg total) by mouth daily. 01/03/23   Fenton, Clint R, PA  apixaban  (ELIQUIS ) 5 MG TABS tablet Take 1 tablet (5 mg total) by mouth 2 (two) times daily. 10/06/22   Arleene Belt, PA-C  atorvastatin  (LIPITOR) 20 MG tablet Take 1 tablet (20 mg total) by mouth daily. 10/06/22   Arleene Belt, PA-C  cholecalciferol  (VITAMIN D ) 1000 units tablet Take 2,000 Units by mouth daily. Gummies    [provider]  fluconazole  (DIFLUCAN ) 150 MG tablet Take 1 tablet (150 mg total) by mouth every 3 (three) days as needed for up to 2 doses (prn yeat infection). 03/21/23   Deatra Face, MD  furosemide  (LASIX ) 80 MG tablet Take 0.5 tablets (40 mg total) by mouth daily. 12/14/22   Clegg, Amy D, NP  gabapentin  (NEURONTIN ) 300 MG capsule Take 300 mg by mouth at bedtime.    [provider]  HYDROcodone -acetaminophen  (NORCO/VICODIN) 5-325 MG tablet Take 1 tablet by mouth every 6 (six) hours as needed. 03/05/23   Prosperi, Christian H, PA-C  HYDROcodone -acetaminophen  (NORCO/VICODIN) 5-325 MG tablet Take 1 tablet by mouth every 6 (six) hours as needed. 03/05/23   Prosperi, Christian H, PA-C  JANUVIA  25 MG tablet Take 1 tablet (25 mg total) by mouth daily. 11/30/22   Deforest Fast, MD  lidocaine  (LIDODERM ) 5 % Place 1 patch onto the skin daily. Remove & Discard patch within  12 hours or as directed by MD 11/30/22   Deforest Fast, MD  losartan  (COZAAR ) 50 MG tablet TAKE 1 TABLET EVERY DAY 03/04/23   Walker, Caitlin S, NP  metFORMIN  (GLUCOPHAGE ) 1000 MG tablet Take 1 tablet (1,000 mg total) by mouth 2 (two) times daily with a meal. 11/30/22   Deforest Fast, MD  methocarbamol  (ROBAXIN ) 500 MG tablet Take 1 tablet (500 mg total) by mouth 3 (three) times daily. 03/05/23   Prosperi, Christian H, PA-C  metoprolol  succinate (TOPROL -XL) 50 MG 24  hr tablet Take 1 tablet (50 mg total) by mouth daily. Take with or immediately following a meal. 11/30/22 12/30/22  Deforest Fast, MD  naproxen  (NAPROSYN ) 375 MG tablet Take 1 tablet (375 mg total) by mouth 2 (two) times daily. 03/21/23   Deatra Face, MD  OVER THE COUNTER MEDICATION Take 1 capsule by mouth daily. Omega XL    [provider]  senna-docusate (SENOKOT-S) 8.6-50 MG tablet Take 1 tablet by mouth 2 (two) times daily. 11/30/22   Deforest Fast, MD  SPIRIVA  HANDIHALER 18 MCG inhalation capsule Place 18 mcg into inhaler and inhale daily. 06/23/22   [provider]  spironolactone  (ALDACTONE ) 25 MG tablet Take 1 tablet (25 mg total) by mouth daily. 12/01/22   Deforest Fast, MD      Allergies    Patient has no known allergies.    Review of Systems   Review of Systems  Physical Exam Updated Vital Signs BP (!) 164/93 (BP Location: Right Arm)   Pulse 87   Temp 98.7 F (37.1 C) (Oral)   Resp 18   SpO2 95%   Physical Exam Vitals and nursing note reviewed.  Constitutional:      Appearance: She is well-developed.  HENT:     Head: Normocephalic and atraumatic.  Eyes:     Conjunctiva/sclera: Conjunctivae normal.  Pulmonary:     Effort: Pulmonary effort is normal.  Abdominal:     Palpations: Abdomen is soft.     Tenderness: There is no abdominal tenderness. There is no guarding.  Musculoskeletal:        General: Normal range of motion.     Cervical back: Normal range of motion and neck supple.     Thoracic back: No tenderness. Normal range of motion.     Lumbar back: Tenderness present. No bony tenderness.       Back:     Comments: No step-off noted with palpation of spine.  Pain seems to be more localized to the upper portion of the L-spine and right side of the lower back.  Patient reports pain all over this area when she tries to move.  I cannot elicit any pain with palpation or percussion in the mid to lower thoracic spine area where patient had  question of discitis previously.  Skin:    General: Skin is warm and dry.     Findings: No rash.  Neurological:     Mental Status: She is alert.     Sensory: No sensory deficit.     Deep Tendon Reflexes: Reflexes are normal and symmetric.     Comments: 5/5 strength in entire lower extremities bilaterally. No sensation deficit.      ED Results / Procedures / Treatments   Labs (all labs ordered are listed, but only abnormal results are displayed) Labs Reviewed  BASIC METABOLIC PANEL WITH GFR - Abnormal; Notable for the following components:      Result Value   Glucose, Bld 127 (*)  Creatinine, Ser 1.07 (*)    GFR, Estimated 57 (*)    All other components within normal limits  CBC WITH DIFFERENTIAL/PLATELET  URINALYSIS, ROUTINE W REFLEX MICROSCOPIC    EKG None  Radiology CT ABDOMEN PELVIS W CONTRAST Result Date: 05/17/2023 CLINICAL DATA:  Low back and abdominal pain since yesterday, no other symptoms, no stated injury EXAM: CT ABDOMEN AND PELVIS WITH CONTRAST CT LUMBAR SPINE WITH CONTRAST TECHNIQUE: Multidetector CT imaging of the abdomen and pelvis was performed using the standard protocol following bolus administration of intravenous contrast. Multidetector CT imaging of the lumbar spine was performed using the standard protocol following bolus administration of intravenous contrast. RADIATION DOSE REDUCTION: This exam was performed according to the departmental dose-optimization program which includes automated exposure control, adjustment of the mA and/or kV according to patient size and/or use of iterative reconstruction technique. CONTRAST:  OMNIPAQUE  IOHEXOL  300 MG/ML  SOLN COMPARISON:  09/06/2022 FINDINGS: CT ABDOMEN PELVIS FINDINGS Lower chest: No acute findings.  Cardiomegaly. Hepatobiliary: No solid liver abnormality is seen. No gallstones, gallbladder wall thickening, or biliary dilatation. Pancreas: Unremarkable. No pancreatic ductal dilatation or surrounding  inflammatory changes. Spleen: Normal in size without significant abnormality. Adrenals/Urinary Tract: Adrenal glands are unremarkable. Small nonobstructive calculus in the inferior pole of the right kidney (series 5, image 70). No left-sided calculi, ureteral calculi, or hydronephrosis. Bladder is unremarkable. Stomach/Bowel: Stomach is within normal limits. Appendix diminutive although normal. No evidence of bowel wall thickening, distention, or inflammatory changes. Sigmoid diverticulosis. Vascular/Lymphatic: Aortic atherosclerosis. No enlarged abdominal or pelvic lymph nodes. Reproductive: No mass or other abnormality. Other: No abdominal wall hernia or abnormality. No ascites. Musculoskeletal: No acute osseous findings. CT LUMBAR SPINE FINDINGS Alignment: Minimal degenerative anterolisthesis of L4 on L5. Otherwise normal lumbar lordosis. Vertebral bodies: Intact. No fracture or dislocation. Disc spaces: Disc space heights preserved with minimal multilevel endplate osteophytosis. Moderate facet degenerative change of the lower lumbar levels. Paraspinous soft tissues: Unremarkable. IMPRESSION: 1. No acute CT findings of the abdomen or pelvis to explain abdominal pain. 2. Small nonobstructive calculus in the inferior pole of the right kidney. No left-sided calculi, ureteral calculi, or hydronephrosis. 3. Sigmoid diverticulosis without evidence of acute diverticulitis. 4. No fracture or dislocation of the lumbar spine. Minimal degenerative anterolisthesis of L4 on L5. Lumbar disc space heights preserved with minimal multilevel endplate osteophytosis. Moderate facet degenerative change of the lower lumbar levels. Lumbar disc and neural foraminal pathology may be further evaluated by MRI if indicated by neurologically localizing signs and symptoms. 5. Cardiomegaly. Aortic Atherosclerosis (ICD10-I70.0). Electronically Signed   By: Fredricka Jenny M.D.   On: 05/17/2023 11:17   CT L-SPINE NO CHARGE Result Date:  05/17/2023 CLINICAL DATA:  Low back and abdominal pain since yesterday, no other symptoms, no stated injury EXAM: CT ABDOMEN AND PELVIS WITH CONTRAST CT LUMBAR SPINE WITH CONTRAST TECHNIQUE: Multidetector CT imaging of the abdomen and pelvis was performed using the standard protocol following bolus administration of intravenous contrast. Multidetector CT imaging of the lumbar spine was performed using the standard protocol following bolus administration of intravenous contrast. RADIATION DOSE REDUCTION: This exam was performed according to the departmental dose-optimization program which includes automated exposure control, adjustment of the mA and/or kV according to patient size and/or use of iterative reconstruction technique. CONTRAST:  OMNIPAQUE  IOHEXOL  300 MG/ML  SOLN COMPARISON:  09/06/2022 FINDINGS: CT ABDOMEN PELVIS FINDINGS Lower chest: No acute findings.  Cardiomegaly. Hepatobiliary: No solid liver abnormality is seen. No gallstones, gallbladder wall thickening, or biliary  dilatation. Pancreas: Unremarkable. No pancreatic ductal dilatation or surrounding inflammatory changes. Spleen: Normal in size without significant abnormality. Adrenals/Urinary Tract: Adrenal glands are unremarkable. Small nonobstructive calculus in the inferior pole of the right kidney (series 5, image 70). No left-sided calculi, ureteral calculi, or hydronephrosis. Bladder is unremarkable. Stomach/Bowel: Stomach is within normal limits. Appendix diminutive although normal. No evidence of bowel wall thickening, distention, or inflammatory changes. Sigmoid diverticulosis. Vascular/Lymphatic: Aortic atherosclerosis. No enlarged abdominal or pelvic lymph nodes. Reproductive: No mass or other abnormality. Other: No abdominal wall hernia or abnormality. No ascites. Musculoskeletal: No acute osseous findings. CT LUMBAR SPINE FINDINGS Alignment: Minimal degenerative anterolisthesis of L4 on L5. Otherwise normal lumbar lordosis. Vertebral  bodies: Intact. No fracture or dislocation. Disc spaces: Disc space heights preserved with minimal multilevel endplate osteophytosis. Moderate facet degenerative change of the lower lumbar levels. Paraspinous soft tissues: Unremarkable. IMPRESSION: 1. No acute CT findings of the abdomen or pelvis to explain abdominal pain. 2. Small nonobstructive calculus in the inferior pole of the right kidney. No left-sided calculi, ureteral calculi, or hydronephrosis. 3. Sigmoid diverticulosis without evidence of acute diverticulitis. 4. No fracture or dislocation of the lumbar spine. Minimal degenerative anterolisthesis of L4 on L5. Lumbar disc space heights preserved with minimal multilevel endplate osteophytosis. Moderate facet degenerative change of the lower lumbar levels. Lumbar disc and neural foraminal pathology may be further evaluated by MRI if indicated by neurologically localizing signs and symptoms. 5. Cardiomegaly. Aortic Atherosclerosis (ICD10-I70.0). Electronically Signed   By: Fredricka Jenny M.D.   On: 05/17/2023 11:17    Procedures Procedures    Medications Ordered in ED Medications  morphine  (PF) 4 MG/ML injection 4 mg (has no administration in time range)  ondansetron  (ZOFRAN ) injection 4 mg (has no administration in time range)    ED Course/ Medical Decision Making/ A&P    Patient seen and examined. History obtained directly from patient.  Reviewed hospitalization notes over the past year and infectious disease outpatient notes.  Labs/EKG: Ordered CBC, BMP, UA.  Imaging: Ordered CT abdomen pelvis, CT L-spine  Medications/Fluids: Ordered: Morphine /Zofran  for pain  Most recent vital signs reviewed and are as follows: BP (!) 164/93 (BP Location: Right Arm)   Pulse 87   Temp 98.7 F (37.1 C) (Oral)   Resp 18   SpO2 95%   Initial impression: Low back pain.  This is a complicated patient with multiple comorbidities.  There is a question of degenerative changes in spine versus  discitis/osteomyelitis last year, however felt less likely to be discitis/osteomyelitis.  Patient's pain today seems to be lower than the levels of the T-spine where this was previously question. Case and work-up discussed with Dr. Florie Husband due to complexity.  We decided to treat symptoms, perform CT of the abdomen and pelvis with lumbar spine reformatting, labs/UA.  11:33 AM Reassessment performed. Patient appears stable.  Reports partial improvement in her symptoms with IV medications.  She is up in the room, walking with her cane.  Labs personally reviewed and interpreted including: CBC with differential is unremarkable; BMP glucose 127, creatinine slightly elevated at 1.07; urinalysis is normal without signs of blood or infection.  Imaging personally visualized and interpreted including: CT of the abdomen pelvis, CT of the lumbar spine --small intrarenal stone noted, degenerative disease in the spine, otherwise unremarkable.  Reviewed pertinent lab work and imaging with patient at bedside. Questions answered.   Most current vital signs reviewed and are as follows: BP (!) 164/93 (BP Location: Right Arm)   Pulse  87   Temp 98.7 F (37.1 C) (Oral)   Resp 18   SpO2 95%   Plan: Discharge to home.   Prescriptions written for: Vicodin. She has muscle relaxer at home.   Patient counseled on use of narcotic pain medications. Counseled not to combine these medications with others containing tylenol . Urged not to drink alcohol, drive, or perform any other activities that requires focus while taking these medications. The patient verbalizes understanding and agrees with the plan.  Other home care instructions discussed: Rest, hydration  ED return instructions discussed: The patient was urged to return to the Emergency Department immediately with worsening of current symptoms, worsening abdominal pain, persistent vomiting, blood noted in stools, fever, or any other concerns.   Follow-up instructions  discussed: Patient encouraged to follow-up with their PCP in 3 days for recheck of symptoms.                                 Medical Decision Making Amount and/or Complexity of Data Reviewed Labs: ordered. Radiology: ordered.  Risk Prescription drug management.   Patient with back/flank pain, complicated history. No focal neurological deficits today. Patient is ambulatory. No warning symptoms of back pain including: fecal incontinence, urinary retention or overflow incontinence, night sweats, waking from sleep with back pain, unexplained fevers or weight loss, h/o cancer, IVDU, recent trauma. Low concern for cauda equina, epidural abscess, or other serious cause of back pain.  Patient was evaluated with CT imaging of the abdomen and pelvis as well as the lumbar spine today --this was reassuring without emergent acute cause of pain.  Symptoms seem lower than the level at which she had questionable discitis, osteomyelitis in the past year --although it seems overall that this was more likely due to degeneration and an infectious process.  Conservative measures such as rest, ice/heat and pain medicine indicated with PCP follow-up if no improvement with conservative management.          Final Clinical Impression(s) / ED Diagnoses Final diagnoses:  Acute right-sided low back pain without sciatica    Rx / DC Orders ED Discharge Orders          Ordered    HYDROcodone -acetaminophen  (NORCO/VICODIN) 5-325 MG tablet  Every 6 hours PRN        05/17/23 1138              Lyna Sandhoff, PA-C 05/17/23 1142    Afton Horse T, DO 05/18/23 [phone]

## 2023-05-17 NOTE — ED Notes (Signed)
 RT called to assess low O2 levels.  Pt noted to have sats of 84%.  Pt placed on 4L [place] initially and sats increased to 100%.  Pt turned down to 2L [place] with sats of 98%.  HR 62, RR 16.

## 2023-05-17 NOTE — ED Triage Notes (Signed)
 C/o lower back pain since yesterday. Denies any other symptoms.

## 2023-05-17 NOTE — Discharge Instructions (Signed)
 Please read and follow all provided instructions.  Your diagnoses today include:  1. Acute right-sided low back pain without sciatica     Tests performed today include: Vital signs - see below for your results today Complete blood cell count: Was normal Basic metabolic panel: Very slightly high blood sugar Urinalysis (urine test): No sign of bleeding or infection  Medications prescribed:  Vicodin (hydrocodone /acetaminophen ) - narcotic pain medication  DO NOT drive or perform any activities that require you to be awake and alert because this medicine can make you drowsy. BE VERY CAREFUL not to take multiple medicines containing Tylenol  (also called acetaminophen ). Doing so can lead to an overdose which can damage your liver and cause liver failure and possibly death.  Take any prescribed medications only as directed.  Home care instructions:  Follow any educational materials contained in this packet Please rest, use ice or heat on your back for the next several days Do not lift, push, pull anything more than 10 pounds for the next week  Follow-up instructions: Please follow-up with your primary care provider in the next 3 days for further evaluation of your symptoms.   Return instructions:  SEEK IMMEDIATE MEDICAL ATTENTION IF YOU HAVE: New numbness, tingling, weakness, or problem with the use of your arms or legs Severe back pain not relieved with medications Loss control of your bowels or bladder Increasing pain in any areas of the body (such as chest or abdominal pain) Shortness of breath, dizziness, or fainting.  Worsening nausea (feeling sick to your stomach), vomiting, fever, or sweats Any other emergent concerns regarding your health   Additional Information:  Your vital signs today were: BP (!) 164/93 (BP Location: Right Arm)   Pulse 87   Temp 98.7 F (37.1 C) (Oral)   Resp 18   SpO2 95%  If your blood pressure (BP) was elevated above 135/85 this visit, please have  this repeated by your doctor within one month. --------------

## 2023-05-30 ENCOUNTER — Encounter: Payer: Self-pay | Admitting: Podiatry

## 2023-05-30 ENCOUNTER — Ambulatory Visit: Admitting: Podiatry

## 2023-05-30 DIAGNOSIS — E119 Type 2 diabetes mellitus without complications: Secondary | ICD-10-CM

## 2023-05-30 DIAGNOSIS — M79675 Pain in left toe(s): Secondary | ICD-10-CM

## 2023-05-30 DIAGNOSIS — B351 Tinea unguium: Secondary | ICD-10-CM

## 2023-05-30 NOTE — Progress Notes (Signed)
 Patient ID: Linda Chase, female   DOB: May 03, 1955, 68 y.o.   MRN: [id number] Complaint:  Visit Type: Patient returns to my office for continued preventative foot care services. Complaint: Patient states" my nails have grown long and thick and become painful to walk and wear shoes" Patient has been diagnosed with DM with neuropathy.. She  presents for preventative foot care services. No changes to ROS  Podiatric Exam: Vascular: dorsalis pedis and posterior tibial pulses are  weakly palpable bilateral. Capillary return is immediate. Temperature gradient is WNL. Skin turgor WNL Absent hair digits  B/L. Sensorium: Semmes Weinstein monofilament test diminished  Diminished tactile sensation bilaterally. Nail Exam: Pt has thick disfigured discolored nails with subungual debris noted bilateral entire nail hallux through fifth toenails Ulcer Exam: There is no evidence of ulcer or pre-ulcerative changes or infection. Orthopedic Exam: Muscle tone and strength are WNL. No limitations in general ROM. No crepitus or effusions noted. Foot type and digits show no abnormalities. Bony prominences are unremarkable. Skin   No infection or ulcers.    Diagnosis:  Tinea unguium, Pain in right toe, pain in left toes.    Treatment & Plan Procedures and Treatment: Consent by patient was obtained for treatment procedures. The patient understood the discussion of treatment and procedures well. All questions were answered thoroughly reviewed. Debridement of mycotic and hypertrophic toenails, 1 through 5 bilateral and clearing of subungual debris.  Return Visit-Office Procedure: Patient instructed to return to the office for a follow up visit 3 months. for continued evaluation and treatment.  Ruffin Cotton DPM

## 2023-06-14 DIAGNOSIS — E785 Hyperlipidemia, unspecified: Secondary | ICD-10-CM | POA: Diagnosis not present

## 2023-06-14 DIAGNOSIS — I10 Essential (primary) hypertension: Secondary | ICD-10-CM | POA: Diagnosis not present

## 2023-06-14 DIAGNOSIS — E876 Hypokalemia: Secondary | ICD-10-CM | POA: Diagnosis not present

## 2023-06-14 DIAGNOSIS — E119 Type 2 diabetes mellitus without complications: Secondary | ICD-10-CM | POA: Diagnosis not present

## 2023-06-14 DIAGNOSIS — J449 Chronic obstructive pulmonary disease, unspecified: Secondary | ICD-10-CM | POA: Diagnosis not present

## 2023-06-14 DIAGNOSIS — E1165 Type 2 diabetes mellitus with hyperglycemia: Secondary | ICD-10-CM | POA: Diagnosis not present

## 2023-08-02 DIAGNOSIS — G4733 Obstructive sleep apnea (adult) (pediatric): Secondary | ICD-10-CM | POA: Diagnosis not present

## 2023-08-29 ENCOUNTER — Other Ambulatory Visit (HOSPITAL_COMMUNITY): Payer: Self-pay | Admitting: Physician Assistant

## 2023-08-30 ENCOUNTER — Ambulatory Visit: Admitting: Podiatry

## 2023-09-02 DIAGNOSIS — G4733 Obstructive sleep apnea (adult) (pediatric): Secondary | ICD-10-CM | POA: Diagnosis not present

## 2023-09-14 ENCOUNTER — Emergency Department (HOSPITAL_BASED_OUTPATIENT_CLINIC_OR_DEPARTMENT_OTHER)
Admission: EM | Admit: 2023-09-14 | Discharge: 2023-09-14 | Disposition: A | Discharge status: Home / Self Care | Attending: Emergency Medicine | Admitting: Emergency Medicine

## 2023-09-14 ENCOUNTER — Emergency Department (HOSPITAL_BASED_OUTPATIENT_CLINIC_OR_DEPARTMENT_OTHER)

## 2023-09-14 ENCOUNTER — Other Ambulatory Visit (HOSPITAL_BASED_OUTPATIENT_CLINIC_OR_DEPARTMENT_OTHER): Payer: Self-pay

## 2023-09-14 ENCOUNTER — Emergency Department (HOSPITAL_BASED_OUTPATIENT_CLINIC_OR_DEPARTMENT_OTHER): Admitting: Radiology

## 2023-09-14 ENCOUNTER — Other Ambulatory Visit: Payer: Self-pay

## 2023-09-14 ENCOUNTER — Encounter (HOSPITAL_BASED_OUTPATIENT_CLINIC_OR_DEPARTMENT_OTHER): Payer: Self-pay

## 2023-09-14 DIAGNOSIS — M7989 Other specified soft tissue disorders: Secondary | ICD-10-CM | POA: Diagnosis not present

## 2023-09-14 DIAGNOSIS — S79922A Unspecified injury of left thigh, initial encounter: Secondary | ICD-10-CM | POA: Diagnosis not present

## 2023-09-14 DIAGNOSIS — I6782 Cerebral ischemia: Secondary | ICD-10-CM | POA: Insufficient documentation

## 2023-09-14 DIAGNOSIS — Z7401 Bed confinement status: Secondary | ICD-10-CM | POA: Diagnosis not present

## 2023-09-14 DIAGNOSIS — I10 Essential (primary) hypertension: Secondary | ICD-10-CM | POA: Diagnosis not present

## 2023-09-14 DIAGNOSIS — W010XXA Fall on same level from slipping, tripping and stumbling without subsequent striking against object, initial encounter: Secondary | ICD-10-CM | POA: Diagnosis not present

## 2023-09-14 DIAGNOSIS — Z7901 Long term (current) use of anticoagulants: Secondary | ICD-10-CM | POA: Insufficient documentation

## 2023-09-14 DIAGNOSIS — Z79899 Other long term (current) drug therapy: Secondary | ICD-10-CM | POA: Diagnosis not present

## 2023-09-14 DIAGNOSIS — S3993XA Unspecified injury of pelvis, initial encounter: Secondary | ICD-10-CM | POA: Diagnosis not present

## 2023-09-14 DIAGNOSIS — G9389 Other specified disorders of brain: Secondary | ICD-10-CM | POA: Diagnosis not present

## 2023-09-14 DIAGNOSIS — S299XXA Unspecified injury of thorax, initial encounter: Secondary | ICD-10-CM | POA: Diagnosis not present

## 2023-09-14 DIAGNOSIS — M79652 Pain in left thigh: Secondary | ICD-10-CM | POA: Insufficient documentation

## 2023-09-14 DIAGNOSIS — R531 Weakness: Secondary | ICD-10-CM | POA: Diagnosis not present

## 2023-09-14 DIAGNOSIS — M47812 Spondylosis without myelopathy or radiculopathy, cervical region: Secondary | ICD-10-CM | POA: Diagnosis not present

## 2023-09-14 DIAGNOSIS — E119 Type 2 diabetes mellitus without complications: Secondary | ICD-10-CM | POA: Insufficient documentation

## 2023-09-14 DIAGNOSIS — M25572 Pain in left ankle and joints of left foot: Secondary | ICD-10-CM | POA: Insufficient documentation

## 2023-09-14 DIAGNOSIS — Z7984 Long term (current) use of oral hypoglycemic drugs: Secondary | ICD-10-CM | POA: Insufficient documentation

## 2023-09-14 DIAGNOSIS — Y92000 Kitchen of unspecified non-institutional (private) residence as  the place of occurrence of the external cause: Secondary | ICD-10-CM | POA: Insufficient documentation

## 2023-09-14 DIAGNOSIS — S93492A Sprain of other ligament of left ankle, initial encounter: Secondary | ICD-10-CM

## 2023-09-14 DIAGNOSIS — Z043 Encounter for examination and observation following other accident: Secondary | ICD-10-CM | POA: Diagnosis not present

## 2023-09-14 DIAGNOSIS — M1712 Unilateral primary osteoarthritis, left knee: Secondary | ICD-10-CM | POA: Diagnosis not present

## 2023-09-14 DIAGNOSIS — M19011 Primary osteoarthritis, right shoulder: Secondary | ICD-10-CM | POA: Diagnosis not present

## 2023-09-14 DIAGNOSIS — M79672 Pain in left foot: Secondary | ICD-10-CM | POA: Diagnosis not present

## 2023-09-14 DIAGNOSIS — R609 Edema, unspecified: Secondary | ICD-10-CM | POA: Diagnosis not present

## 2023-09-14 DIAGNOSIS — S93431A Sprain of tibiofibular ligament of right ankle, initial encounter: Secondary | ICD-10-CM | POA: Diagnosis not present

## 2023-09-14 DIAGNOSIS — M7732 Calcaneal spur, left foot: Secondary | ICD-10-CM | POA: Diagnosis not present

## 2023-09-14 DIAGNOSIS — S99922A Unspecified injury of left foot, initial encounter: Secondary | ICD-10-CM | POA: Diagnosis not present

## 2023-09-14 DIAGNOSIS — W19XXXA Unspecified fall, initial encounter: Secondary | ICD-10-CM | POA: Diagnosis not present

## 2023-09-14 DIAGNOSIS — M25511 Pain in right shoulder: Secondary | ICD-10-CM | POA: Diagnosis not present

## 2023-09-14 DIAGNOSIS — M129 Arthropathy, unspecified: Secondary | ICD-10-CM | POA: Diagnosis not present

## 2023-09-14 DIAGNOSIS — S99912A Unspecified injury of left ankle, initial encounter: Secondary | ICD-10-CM | POA: Diagnosis not present

## 2023-09-14 DIAGNOSIS — S93491A Sprain of other ligament of right ankle, initial encounter: Secondary | ICD-10-CM | POA: Diagnosis not present

## 2023-09-14 LAB — COMPREHENSIVE METABOLIC PANEL WITH GFR
ALT: 17 U/L (ref 0–44)
AST: 70 U/L — ABNORMAL HIGH (ref 15–41)
Albumin: 4.1 g/dL (ref 3.5–5.0)
Alkaline Phosphatase: 78 U/L (ref 38–126)
Anion gap: 11 (ref 5–15)
BUN: 12 mg/dL (ref 8–23)
CO2: 29 mmol/L (ref 22–32)
Calcium: 9.7 mg/dL (ref 8.9–10.3)
Chloride: 101 mmol/L (ref 98–111)
Creatinine, Ser: 0.96 mg/dL (ref 0.44–1.00)
GFR, Estimated: >60 mL/min (ref >60)
Glucose, Bld: 132 mg/dL — ABNORMAL HIGH (ref 70–99)
Potassium: 4.1 mmol/L (ref 3.5–5.1)
Sodium: 141 mmol/L (ref 135–145)
Total Bilirubin: 0.4 mg/dL (ref 0.0–1.2)
Total Protein: 8.3 g/dL — ABNORMAL HIGH (ref 6.5–8.1)

## 2023-09-14 LAB — CBC
HCT: 39.2 % (ref 36.0–46.0)
Hemoglobin: 11.8 g/dL — ABNORMAL LOW (ref 12.0–15.0)
MCH: 27.3 pg (ref 26.0–34.0)
MCHC: 30.1 g/dL (ref 30.0–36.0)
MCV: 90.5 fL (ref 80.0–100.0)
Platelets: 265 K/uL (ref 150–400)
RBC: 4.33 MIL/uL (ref 3.87–5.11)
RDW: 13.3 % (ref 11.5–15.5)
WBC: 7.9 K/uL (ref 4.0–10.5)
nRBC: 0 % (ref 0.0–0.2)

## 2023-09-14 MED ORDER — ACETAMINOPHEN 500 MG PO TABS
1000.0000 mg | ORAL_TABLET | Freq: Once | ORAL | Status: AC
  Administered 2023-09-14: 1000 mg via ORAL
  Filled 2023-09-14: qty 2

## 2023-09-14 MED ORDER — FENTANYL CITRATE PF 50 MCG/ML IJ SOSY
50.0000 ug | PREFILLED_SYRINGE | Freq: Once | INTRAMUSCULAR | Status: AC
  Administered 2023-09-14: 50 ug via INTRAVENOUS
  Filled 2023-09-14: qty 1

## 2023-09-14 MED ORDER — DICLOFENAC SODIUM 1 % EX GEL
4.0000 g | Freq: Four times a day (QID) | CUTANEOUS | 0 refills | Status: AC
Start: 2023-09-14 — End: ?
  Filled 2023-09-14: qty 100, 7d supply, fill #0

## 2023-09-14 MED ORDER — KETOROLAC TROMETHAMINE 15 MG/ML IJ SOLN
15.0000 mg | Freq: Once | INTRAMUSCULAR | Status: AC
  Administered 2023-09-14: 15 mg via INTRAMUSCULAR
  Filled 2023-09-14: qty 1

## 2023-09-14 MED ORDER — OXYCODONE HCL 5 MG PO TABS
5.0000 mg | ORAL_TABLET | Freq: Once | ORAL | Status: AC
  Administered 2023-09-14: 5 mg via ORAL
  Filled 2023-09-14: qty 1

## 2023-09-14 NOTE — ED Notes (Signed)
 Called [facility] to transport the patient home--Zorina Mallin

## 2023-09-14 NOTE — Discharge Instructions (Addendum)
 Please follow-up with your family doctor in the office.  Please return if you do not feel safe at home.  Use the gel as prescribed. Also take tylenol  1000mg (2 extra strength) four times a day.

## 2023-09-14 NOTE — ED Notes (Signed)
 Patient transported to CT

## 2023-09-14 NOTE — ED Triage Notes (Signed)
Patient states she is on blood thinners.

## 2023-09-14 NOTE — ED Provider Notes (Signed)
 [place] EMERGENCY DEPARTMENT AT [facility] Provider Note   CSN: [id number] Arrival date & time: 09/14/23  1104     Patient presents with: Fall   Linda Chase is a 68 y.o. female.    Fall     68 year old female with medical history significant for morbid obesity, diabetes mellitus, HTN, depression presenting to the emergency department with a chief complaint of of fall.  The patient states that she sustained a mechanical fall slipping on something in her kitchen while ambulating.  She denies head trauma but states that she is on anticoagulation takes Eliquis .  She complains of pain to her right shoulder, pain along her left thigh and left ankle.  She has been able to bear weight and walk as a result of the pain.  She denies any other injuries or complaints.  She arrives GCS 15, ABC intact.  Prior to Admission medications   Medication Sig Start Date End Date Taking? Authorizing Provider  acetaminophen  (TYLENOL ) 500 MG tablet Take 1 tablet (500 mg total) by mouth every 6 (six) hours as needed. 03/21/23   Charlyn Sora, MD  albuterol  (VENTOLIN  HFA) 108 (90 Base) MCG/ACT inhaler Inhale 1-2 puffs into the lungs as needed for wheezing or shortness of breath. 03/18/22   [provider]  amiodarone  (PACERONE ) 200 MG tablet TAKE 1 TABLET EVERY DAY 08/29/23   Fenton, Clint R, PA  apixaban  (ELIQUIS ) 5 MG TABS tablet Take 1 tablet (5 mg total) by mouth 2 (two) times daily. 10/06/22   Colletta Manuelita Garre, PA-C  atorvastatin  (LIPITOR) 20 MG tablet Take 1 tablet (20 mg total) by mouth daily. 10/06/22   Colletta Manuelita Garre, PA-C  cholecalciferol  (VITAMIN D ) 1000 units tablet Take 2,000 Units by mouth daily. Gummies    [provider]  fluconazole  (DIFLUCAN ) 150 MG tablet Take 1 tablet (150 mg total) by mouth every 3 (three) days as needed for up to 2 doses (prn yeat infection). 03/21/23   Charlyn Sora, MD  furosemide  (LASIX ) 80 MG tablet Take 0.5 tablets (40 mg  total) by mouth daily. 12/14/22   Clegg, Amy D, NP  gabapentin  (NEURONTIN ) 300 MG capsule Take 300 mg by mouth at bedtime.    [provider]  HYDROcodone -acetaminophen  (NORCO/VICODIN) 5-325 MG tablet Take 1 tablet by mouth every 6 (six) hours as needed for severe pain (pain score 7-10). 05/17/23   Desiderio Chew, PA-C  JANUVIA  25 MG tablet Take 1 tablet (25 mg total) by mouth daily. 11/30/22   Fairy Frames, MD  lidocaine  (LIDODERM ) 5 % Place 1 patch onto the skin daily. Remove & Discard patch within 12 hours or as directed by MD 11/30/22   Fairy Frames, MD  losartan  (COZAAR ) 50 MG tablet TAKE 1 TABLET EVERY DAY 03/04/23   Walker, Caitlin S, NP  metFORMIN  (GLUCOPHAGE ) 1000 MG tablet Take 1 tablet (1,000 mg total) by mouth 2 (two) times daily with a meal. 11/30/22   Fairy Frames, MD  methocarbamol  (ROBAXIN ) 500 MG tablet Take 1 tablet (500 mg total) by mouth 3 (three) times daily. 03/05/23   Prosperi, Christian H, PA-C  metoprolol  succinate (TOPROL -XL) 50 MG 24 hr tablet Take 1 tablet (50 mg total) by mouth daily. Take with or immediately following a meal. 11/30/22 12/30/22  Fairy Frames, MD  naproxen  (NAPROSYN ) 375 MG tablet Take 1 tablet (375 mg total) by mouth 2 (two) times daily. 03/21/23   Charlyn Sora, MD  OVER THE COUNTER MEDICATION Take 1 capsule by mouth daily. Omega  XL    [provider]  senna-docusate (SENOKOT-S) 8.6-50 MG tablet Take 1 tablet by mouth 2 (two) times daily. 11/30/22   Fairy Frames, MD  SPIRIVA  HANDIHALER 18 MCG inhalation capsule Place 18 mcg into inhaler and inhale daily. 06/23/22   [provider]  spironolactone  (ALDACTONE ) 25 MG tablet Take 1 tablet (25 mg total) by mouth daily. 12/01/22   Fairy Frames, MD    Allergies: Patient has no known allergies.    Review of Systems  All other systems reviewed and are negative.   Updated Vital Signs There were no vitals taken for this visit.  Physical Exam Vitals and nursing note  reviewed.  Constitutional:      General: She is not in acute distress.    Appearance: She is well-developed.     Comments: GCS 15, ABC intact, morbidly obese  HENT:     Head: Normocephalic and atraumatic.  Eyes:     Extraocular Movements: Extraocular movements intact.     Conjunctiva/sclera: Conjunctivae normal.     Pupils: Pupils are equal, round, and reactive to light.  Neck:     Comments: No midline tenderness to palpation of the cervical spine.  Range of motion intact Cardiovascular:     Rate and Rhythm: Normal rate and regular rhythm.     Heart sounds: No murmur heard. Pulmonary:     Effort: Pulmonary effort is normal. No respiratory distress.     Breath sounds: Normal breath sounds.  Chest:     Comments: Clavicles stable nontender to AP compression.  Chest wall stable and nontender to AP and lateral compression. Abdominal:     Palpations: Abdomen is soft.     Tenderness: There is no abdominal tenderness.     Comments: Pelvis stable to lateral compression  Musculoskeletal:        General: Swelling and tenderness present.     Cervical back: Neck supple.     Comments: No midline tenderness to palpation of the thoracic or lumbar spine.  Extremities atraumatic with intact range of motion with the exception of mild right shoulder tenderness, intact range of motion, tenderness to palpation of the medial and lateral malleolus with mild swelling noted, tenderness about the dorsum of the left foot, bilateral lower extremity swelling noted, 1+, mild tenderness of the mid left thigh  Skin:    General: Skin is warm and dry.  Neurological:     Mental Status: She is alert.     Comments: Cranial nerves II through XII grossly intact.  Moving all 4 extremities spontaneously.  Sensation grossly intact all 4 extremities     (all labs ordered are listed, but only abnormal results are displayed) Labs Reviewed - No data to display  EKG: None  Radiology: No results found.   Procedures    Medications Ordered in the ED - No data to display                                  Medical Decision Making Amount and/or Complexity of Data Reviewed Labs: ordered. Radiology: ordered.  Risk Prescription drug management.    68 year old female with medical history significant for morbid obesity, diabetes mellitus, HTN, depression presenting to the emergency department with a chief complaint of of fall.  The patient states that she sustained a mechanical fall slipping on something in her kitchen while ambulating.  She denies head trauma but states that she is on anticoagulation  takes Eliquis .  She complains of pain to her right shoulder, pain along her left thigh and left ankle.  She has been able to bear weight and walk as a result of the pain.  She denies any other injuries or complaints.  She arrives GCS 15, ABC intact.  On arrival, vitals stable. Currently, she is awake, alert, and protecting her own airway and is hemodynamically stable. Neuro exam nonfocal, trauma exam as per above with multiple areas of tenderness along the extremities, primarily right shoulder and left ankle.   Trauma imaging revealed (full reports in EMR): Portable CXR:  No evidence of pneumothorax or tracheal deviation Portable Pelvis:  No evidence of acute hip fracture or malalignment CT scans (pan-scan):  CT Head: IMPRESSION:  1. No acute intracranial abnormality.  2. Stable changes of prior aneurysm clipping in the right para  clinoid region with associated cystic encephalomalacia of the right  parieto-occipital lobe and more mild encephalomalacia in the  anterior inferior right temporal lobe.  3. Chronic microvascular ischemic white matter changes.   CT Cervical Spine: IMPRESSION:  1. No acute fracture or malalignment.  2. Reversal of the normal cervical lordosis is favored to be chronic  and degenerative in nature. Difficult to exclude acute muscle spasm  as a contributing factor.  3. Multilevel  cervical spondylosis most significant at C4-C5 and  C5-C6.  4. Approximately 1.9 cm left thyroid nodule or cyst. Recommend  further evaluation with non emergent dedicated thyroid ultrasound.   XR Left Foot: IMPRESSION:  Moderate soft tissue swelling about the dorsum of the foot.  Otherwise, no acute, displaced fracture, or dislocation visualized.   XR Left Ankle:   IMPRESSION:  1. Subtle lucency through the medial malleolus, below the level of  the tibial plafond, worrisome for a nondisplaced fracture.  2. Multiple small ossific fragment subjacent to the medial  malleolus, possibly reflecting small avulsion fractures.   XR Right Shoulder: IMPRESSION:  Suboptimal visualization of the shoulder bones due to patient's body  habitus. Otherwise, no acute, displaced fracture or dislocation  visualized.   XR Left Femur: Neg  CT Right Shoulder: pending  CT Left Ankle: pending   Labs revealed CBC with no leukocytosis, mild anemia to 11.8, close to baseline of 12, CMP without significant electrolyte abnormality, normal renal function.  CT imaging pending at time of signout.  Plan to follow-up results of CT, ambulate the patient and reassess pain, ultimate disposition pending results of reassessment and ambulatory trial.  Signout given to Dr. Emil at [phone].       Final diagnoses:  None    ED Discharge Orders     None          Jerrol Agent, MD 09/14/23 [phone]

## 2023-09-14 NOTE — ED Notes (Signed)
 Pt ambulated in hallway with front wheel walker with CAM boot in place. Pt ambulated with steady gait with utilization of front wheel walker. Pt reports she has a walker at home to use. Pt ambulated back to stretcher and placed back in bed. Pt linen and brief changed. Pt in NAD, call bell in reach.

## 2023-09-14 NOTE — ED Provider Notes (Signed)
 Received patient in turnover from Dr. Jerrol.  Please see their note for further details of Hx, PE.  Briefly patient is a 68 y.o. female with a Fall .  Patient with pain to right shoulder and L foot.  Reportedly nonsyncopal fall.  Plan for CT imaging of the left foot and right shoulder.  Concern for ability to ambulate will attempt to ambulate post.  CT without obvious fracture of the shoulder or the ankle.  On my independent interpretation of the CT of the left ankle with likely ATF strain.  Placed in a boot for comfort.  I discussed results with patient.  I did offer to have the patient possibly placed into a nursing facility.  Would like to try and go home.  Will place home health orders.    Emil Share, DO 09/14/23 [phone]

## 2023-09-14 NOTE — ED Triage Notes (Signed)
 Fell yesterday.  Today arrived via EMS. Pain to left thigh and left foot.  C/O swollen.  Pain to right shoulder.  Fall from couch and slide out.  States un able to walk.

## 2023-09-14 NOTE — ED Notes (Signed)
 Pt d/c instructions, medications, and follow-up care reviewed with pt. Pt verbalized understanding and had no further questions at time of d/c. Pt CA&Ox4 and in NAD at time of d/c. Pt paperwork and belongings handed over to [employer] for transport home.

## 2023-09-15 DIAGNOSIS — S93492A Sprain of other ligament of left ankle, initial encounter: Secondary | ICD-10-CM | POA: Diagnosis not present

## 2023-09-19 ENCOUNTER — Ambulatory Visit: Payer: Self-pay

## 2023-09-19 NOTE — Telephone Encounter (Signed)
 FYI Only or Action Required?: FYI only for provider.: referred pt to ED: pt verbalized understanding.  Patient was last seen in primary care on  n/a.  Called Nurse Triage reporting Leg Swelling.  Symptoms began ongoing.  Interventions attempted: Nothing.  Symptoms are: gradually worsening.  Triage Disposition: See PCP When Office is Open (Within 3 Days)  Patient/caregiver understands and will follow disposition?: Yes   Copied from CRM [id number]. Topic: Clinical - Red Word Triage >> Sep 19, 2023  3:21 PM Mercer PEDLAR wrote: Red Word that prompted transfer to Nurse Triage: Swollen leg. Reason for Disposition  [1] MILD swelling of both ankles (i.e., pedal edema) AND [2] new-onset or getting worse  Answer Assessment - Initial Assessment Questions 1. ONSET: When did the swelling start? (e.g., minutes, hours, days)     X couple days 2. LOCATION: What part of the leg is swollen?  Are both legs swollen or just one leg?     Right leg bigger than left : right foot 8/10 pain 3. SEVERITY: How bad is the swelling? (e.g., localized; mild, moderate, severe)     moderate 4. REDNESS: Is there redness or signs of infection?    no 5. PAIN: Is the swelling painful to touch? If Yes, ask: How painful is it?   (Scale 1-10; mild, moderate or severe)     8/10 6. FEVER: Do you have a fever? If Yes, ask: What is it, how was it measured, and when did it start?      no 7. CAUSE: What do you think is causing the leg swelling?     unknown 8. MEDICAL HISTORY: Do you have a history of blood clots (e.g., DVT), cancer, heart failure, kidney disease, or liver failure?     no 9. RECURRENT SYMPTOM: Have you had leg swelling before? If Yes, ask: When was the last time? What happened that time?     no 10. OTHER SYMPTOMS: Do you have any other symptoms? (e.g., chest pain, difficulty breathing)       SOB at times but none now 11. PREGNANCY: Is there any chance you are pregnant? When was  your last menstrual period?       Na  Not a pt : referred to ED: pt verbalized understanding  Protocols used: Leg Swelling and Edema-A-AH

## 2023-09-20 ENCOUNTER — Ambulatory Visit (HOSPITAL_COMMUNITY): Admitting: Physician Assistant

## 2023-09-23 ENCOUNTER — Other Ambulatory Visit (HOSPITAL_COMMUNITY): Payer: Self-pay | Admitting: Physician Assistant

## 2023-09-26 ENCOUNTER — Other Ambulatory Visit (HOSPITAL_BASED_OUTPATIENT_CLINIC_OR_DEPARTMENT_OTHER): Payer: Self-pay

## 2023-10-03 DIAGNOSIS — G4733 Obstructive sleep apnea (adult) (pediatric): Secondary | ICD-10-CM | POA: Diagnosis not present

## 2023-10-04 ENCOUNTER — Ambulatory Visit (HOSPITAL_COMMUNITY): Admission: RE | Admit: 2023-10-04 | Source: Ambulatory Visit | Admitting: Physician Assistant

## 2023-10-04 NOTE — Progress Notes (Signed)
 Linda Chase                                          MRN: [id number]   10/04/2023   The [facility] Quality Team Specialist reviewed this patient medical record for the purposes of chart review for care gap closure. The following were reviewed: chart review for care gap closure-diabetic eye exam.    [facility] Quality Team

## 2023-10-17 ENCOUNTER — Ambulatory Visit (HOSPITAL_COMMUNITY): Admission: RE | Admit: 2023-10-17 | Source: Ambulatory Visit | Admitting: Physician Assistant

## 2023-10-18 ENCOUNTER — Ambulatory Visit: Admitting: Podiatry

## 2023-10-24 ENCOUNTER — Other Ambulatory Visit (HOSPITAL_COMMUNITY): Payer: Self-pay | Admitting: Physician Assistant

## 2023-10-25 ENCOUNTER — Other Ambulatory Visit: Payer: Self-pay

## 2023-10-25 DIAGNOSIS — Z1231 Encounter for screening mammogram for malignant neoplasm of breast: Secondary | ICD-10-CM

## 2023-10-28 ENCOUNTER — Ambulatory Visit (HOSPITAL_COMMUNITY)
Admission: RE | Admit: 2023-10-28 | Discharge: 2023-10-28 | Disposition: A | Source: Ambulatory Visit | Attending: Physician Assistant | Admitting: Physician Assistant

## 2023-10-28 VITALS — BP 150/96 | HR 71 | Ht 64.0 in | Wt 293.6 lb

## 2023-10-28 DIAGNOSIS — D6869 Other thrombophilia: Secondary | ICD-10-CM

## 2023-10-28 DIAGNOSIS — I4891 Unspecified atrial fibrillation: Secondary | ICD-10-CM | POA: Diagnosis not present

## 2023-10-28 DIAGNOSIS — I4819 Other persistent atrial fibrillation: Secondary | ICD-10-CM | POA: Diagnosis not present

## 2023-10-28 DIAGNOSIS — Z6841 Body Mass Index (BMI) 40.0 and over, adult: Secondary | ICD-10-CM | POA: Diagnosis not present

## 2023-10-28 DIAGNOSIS — E66813 Obesity, class 3: Secondary | ICD-10-CM | POA: Diagnosis not present

## 2023-10-28 NOTE — Progress Notes (Signed)
 Primary Care Physician: Sim Emery CROME, MD Primary Cardiologist: Vina Gull, MD Electrophysiologist: None  Referring Physician: Heart Failure TOC   Linda Chase is a 68 y.o. female with a history of OSA, DM, HTN, cerebral aneurysm s/p clipping, COPD, prior tobacco use, atrial fibrillation who presents for follow up in the [facility].  The patient was admitted in [place] in 04/24 with atrial fibrillation with RVR (uncertain if new). Spontaneously converted to SR.   Presented to ED for evaluation of back pain in 08/24. Admitted with acute respiratory failure with hypoxia and hypercapnia in setting of untreated OSA/OHS/acute CHF and Afib with RVR. Required BiPAP. Echo with EF 50-%, mildly reduced RV, evidence of RV pressure overload w/ flattened interventricular septum in systole, RVSP 44 mmHg, moderate to severe TR. Initially treated with IV diltiazem  but later switched to IV amiodarone . Amiodarone  switched to PO for loading. In atrial fibrillation at discharge. She was diuresed with IV lasix , 327>>285 lb. Her hospital course was further c/b concern for possible degenerative changes vs discitis/osteomyelitis in T8-T9 spine. She was seen by ID. Initially on IV abx. Unable to get MRI d/t prior cerebral aneurysm coiling. Disc aspirate with no growth. Antibiotics discontinued. Patient is on Eliquis  for stroke prevention. She was scheduled for DCCV but converted chemically to SR. She was lost to follow up.  Patient returns for follow up for atrial fibrillation and amiodarone  monitoring. She remains in SR today. She stopped amiodarone  a few days ago due to concern of potential side effects. No bleeding issues on anticoagulation. She reports compliance with her CPAP.   Today, she  denies symptoms of palpitations, chest pain, orthopnea, PND, lower extremity edema, dizziness, presyncope, syncope, bleeding, or neurologic sequela. The patient is tolerating medications without  difficulties and is otherwise without complaint today.    Atrial Fibrillation Risk Factors:  she does have symptoms or diagnosis of sleep apnea. she does not have a history of rheumatic fever. The patient does have a history of early familial atrial fibrillation or other arrhythmias. Brother has afib.  Atrial Fibrillation Management history:  Previous antiarrhythmic drugs: amiodarone   Previous cardioversions: none Previous ablations: none Anticoagulation history: Eliquis   ROS- All systems are reviewed and negative except as per the HPI above.  Past Medical History:  Diagnosis Date   Arthritis    Cerebral aneurysm    x2   Depression    Diabetes mellitus    Hypertension     Current Outpatient Medications  Medication Sig Dispense Refill   acetaminophen  (TYLENOL ) 500 MG tablet Take 1 tablet (500 mg total) by mouth every 6 (six) hours as needed. (Patient taking differently: Take 500 mg by mouth as needed.) 30 tablet 0   albuterol  (VENTOLIN  HFA) 108 (90 Base) MCG/ACT inhaler Inhale 1-2 puffs into the lungs as needed for wheezing or shortness of breath.     apixaban  (ELIQUIS ) 5 MG TABS tablet Take 1 tablet (5 mg total) by mouth 2 (two) times daily. 60 tablet 1   atorvastatin  (LIPITOR) 20 MG tablet Take 1 tablet (20 mg total) by mouth daily. 90 tablet 3   cholecalciferol  (VITAMIN D ) 1000 units tablet Take 2,000 Units by mouth daily. Gummies     diclofenac  Sodium (VOLTAREN ) 1 % GEL Apply 4 g topically 4 (four) times daily. 100 g 0   fluconazole  (DIFLUCAN ) 150 MG tablet Take 1 tablet (150 mg total) by mouth every 3 (three) days as needed for up to 2 doses (prn yeat infection).  2 tablet 0   furosemide  (LASIX ) 80 MG tablet Take 0.5 tablets (40 mg total) by mouth daily. 15 tablet 3   HYDROcodone -acetaminophen  (NORCO/VICODIN) 5-325 MG tablet Take 1 tablet by mouth every 6 (six) hours as needed for severe pain (pain score 7-10). 8 tablet 0   JANUVIA  25 MG tablet Take 1 tablet (25 mg total) by  mouth daily. 30 tablet 0   lidocaine  (LIDODERM ) 5 % Place 1 patch onto the skin daily. Remove & Discard patch within 12 hours or as directed by MD (Patient taking differently: Place 1 patch onto the skin as needed. Remove & Discard patch within 12 hours or as directed by MD) 30 patch 0   losartan  (COZAAR ) 50 MG tablet TAKE 1 TABLET EVERY DAY 90 tablet 1   metFORMIN  (GLUCOPHAGE ) 1000 MG tablet Take 1 tablet (1,000 mg total) by mouth 2 (two) times daily with a meal. 60 tablet 0   methocarbamol  (ROBAXIN ) 500 MG tablet Take 1 tablet (500 mg total) by mouth 3 (three) times daily. 60 tablet 0   metoprolol  succinate (TOPROL -XL) 50 MG 24 hr tablet Take 1 tablet (50 mg total) by mouth daily. Take with or immediately following a meal. 30 tablet 1   senna-docusate (SENOKOT-S) 8.6-50 MG tablet Take 1 tablet by mouth 2 (two) times daily. (Patient taking differently: Take 1 tablet by mouth as needed.) 30 tablet 1   spironolactone  (ALDACTONE ) 25 MG tablet Take 1 tablet (25 mg total) by mouth daily. 30 tablet 0   amiodarone  (PACERONE ) 200 MG tablet Take 1 tablet by mouth daily (Patient not taking: Reported on 10/28/2023) 30 tablet 0   gabapentin  (NEURONTIN ) 300 MG capsule Take 300 mg by mouth at bedtime. (Patient not taking: Reported on 10/28/2023)     OVER THE COUNTER MEDICATION Take 1 capsule by mouth daily. Omega XL (Patient not taking: Reported on 10/28/2023)     No current facility-administered medications for this encounter.    Physical Exam: BP (!) 150/96   Pulse 71   Ht 5' 4 (1.626 m)   Wt 133.2 kg   BMI 50.40 kg/m   GEN: Well nourished, well developed in no acute distress CARDIAC: Regular rate and rhythm, no murmurs, rubs, gallops RESPIRATORY:  Clear to auscultation without rales, wheezing or rhonchi  ABDOMEN: Soft, non-tender, non-distended EXTREMITIES:  Trace bilateral lower extremity edema, No deformity    Wt Readings from Last 3 Encounters:  10/28/23 133.2 kg  09/14/23 136.1 kg   03/05/23 127 kg     EKG today demonstrates  SR Vent. rate 71 BPM PR interval 164 ms QRS duration 82 ms QT/QTcB 432/469 ms   Echo 09/07/22 demonstrated   1. Left ventricular ejection fraction, by estimation, is 50%. Left  ventricular ejection fraction by 3D volume is 49 %. The left ventricle has  mildly decreased function. The left ventricle demonstrates global  hypokinesis. There is mild left ventricular hypertrophy. Left ventricular diastolic parameters are indeterminate. There is the interventricular septum is flattened in systole, consistent with right ventricular pressure overload.   2. Right ventricular systolic function is mildly reduced. The right  ventricular size is mildly enlarged. Mildly increased right ventricular  wall thickness. There is mildly elevated pulmonary artery systolic  pressure. The estimated right ventricular  systolic pressure is 44.8 mmHg.   3. Left atrial size was mildly dilated.   4. Right atrial size was mildly dilated.   5. The mitral valve is grossly normal. Trivial mitral valve  regurgitation. No evidence of  mitral stenosis. Moderate mitral annular  calcification.   6. There is one beat of systolic flow reversal of hepatic vein spectral  Doppler. Tricuspid valve regurgitation is moderate to severe.   7. Nodular calcification of leaflet tips. The aortic valve is tricuspid.  There is mild calcification of the aortic valve. Aortic valve  regurgitation is trivial. No aortic stenosis is present.   8. Aortic dilatation noted. There is mild dilatation of the ascending  aorta, measuring 42 mm.   9. The inferior vena cava is dilated in size with <50% respiratory  variability, suggesting right atrial pressure of 15 mmHg.    CHA2DS2-VASc Score = 6  The patient's score is based upon: CHF History: 1 HTN History: 1 Diabetes History: 1 Stroke History: 0 Vascular Disease History: 1 Age Score: 1 Gender Score: 1       ASSESSMENT AND PLAN: Persistent  Atrial Fibrillation (ICD10:  I48.19) The patient's CHA2DS2-VASc score is 6, indicating a 9.7% annual risk of stroke.   Patient appears to be maintaining SR. She self discontinued amiodarone  due to concerns about side effects. We discussed alternative rhythm control options. She is not currently a candidate for ablation with elevated BMI. Would avoid class IC and Multaq with chronic CHF. Could consider dofetilide after amiodarone  washout if she is consistent with follow up.  Continue Eliquis  5 mg BID Continue Toprol  50 mg daily  Secondary Hypercoagulable State (ICD10:  D68.69) The patient is at significant risk for stroke/thromboembolism based upon her CHA2DS2-VASc Score of 6.  Continue Apixaban  (Eliquis ). No bleeding issues.   HTN Mildly elevated today. She no showed her appointment for renal artery ultrasound. No changes today.   OSA/OHS Diagnosed remotely in [place], had repeat study 02/28/23 at [facility]. 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  Encouraged lifestyle modification Will refer her to PharmD for GLP-1A consideration.    Follow up in the AF clinic in 3 months. She also overdue for follow up with her primary cardiology team, will request appointment.    Daril Kicks PA-C Afib Clinic [facility] [address] [place], [name] [phone]

## 2023-11-01 ENCOUNTER — Ambulatory Visit (INDEPENDENT_AMBULATORY_CARE_PROVIDER_SITE_OTHER)

## 2023-11-01 ENCOUNTER — Encounter: Payer: Self-pay | Admitting: Podiatry

## 2023-11-01 ENCOUNTER — Ambulatory Visit: Admitting: Podiatry

## 2023-11-01 DIAGNOSIS — M25572 Pain in left ankle and joints of left foot: Secondary | ICD-10-CM

## 2023-11-01 DIAGNOSIS — M7752 Other enthesopathy of left foot: Secondary | ICD-10-CM

## 2023-11-01 DIAGNOSIS — M7672 Peroneal tendinitis, left leg: Secondary | ICD-10-CM

## 2023-11-01 MED ORDER — METHYLPREDNISOLONE 4 MG PO TBPK: ORAL_TABLET | ORAL | 0 refills | Status: AC

## 2023-11-01 NOTE — Progress Notes (Signed)
 Patient presents 3 weeks status post ankle sprain left foot.  She is continue to have some pain and swelling.  Says the swelling and pain has reduced in the past 3 weeks.  Not nearly as painful as it was initially.  She has been intermittently wearing a pneumatic cast and using a cane.  She also been using elastic ankle wrap.  She says this helps with the swelling.  Had not has not noticed any large increase in her blood glucose.  No fever chills nausea or vomiting   Physical exam:  General appearance: Pleasant, and in no acute distress. AOx3.  Vascular: Pedal pulses: DP 2/4 bilaterally, PT 1/4 bilaterally.  Severe edema lower legs bilaterally. Capillary fill time immediate bilaterally.  Neurological: Light touch intact feet bilaterally.  Normal Achilles reflex bilaterally.    Dermatologic:   Skin normal temperature bilaterally.  Skin normal color, tone, and texture bilaterally.   Musculoskeletal: Tenderness anterior lateral ankle and along the ATFL left.  Tenderness along the peroneal tendons from the distal fibula to the retromalleolar area.  Some tenderness at the insertion of the Achilles tendon left.  No defects in Achilles tendon noted.  No tenderness with lateral compression of the calcaneus.  Radiographs: 3 views ankle left: Sellymin joint space narrowing at the ankle joint.  Anterior impingement.  No ascending fractures around the ankle or the calcaneus or midfoot.  No dislocations noted.  Diagnosis: 1.  Peroneal tendinitis left 2.  Arthralgia ankle joint left  Plan: -Established office visit for evaluation and management level 3. - Discussed with her the pain around the ankle and swelling she injured it 3 weeks ago but it sounds like it is getting been getting better.  I think at this point for some inflammation that still remains from the initial ankle sprain and injury.  She already has a pneumatic cast encouraged her to wear this for the next 2 weeks.  Also elevation and ice  will help reduce the discomfort.  Will also give her a prescription for a Medrol  Dosepak which will help with the inflammation also.  Cautioned her that it might increase her blood sugar somewhat.  If it does continue to bother her may need to consider injections. -Rx Medrol  Dosepak take as directed.   Return as needed, if not better in 2 to 3 weeks call us  for an appointment

## 2023-11-02 DIAGNOSIS — G4733 Obstructive sleep apnea (adult) (pediatric): Secondary | ICD-10-CM | POA: Diagnosis not present

## 2023-11-03 ENCOUNTER — Telehealth (HOSPITAL_BASED_OUTPATIENT_CLINIC_OR_DEPARTMENT_OTHER): Payer: Self-pay | Admitting: *Deleted

## 2023-11-03 ENCOUNTER — Ambulatory Visit (INDEPENDENT_AMBULATORY_CARE_PROVIDER_SITE_OTHER): Admitting: Family

## 2023-11-03 ENCOUNTER — Encounter (HOSPITAL_BASED_OUTPATIENT_CLINIC_OR_DEPARTMENT_OTHER): Payer: Self-pay | Admitting: Family

## 2023-11-03 VITALS — BP 132/80 | HR 68 | Ht 64.0 in | Wt 297.8 lb

## 2023-11-03 DIAGNOSIS — E1165 Type 2 diabetes mellitus with hyperglycemia: Secondary | ICD-10-CM

## 2023-11-03 DIAGNOSIS — I50812 Chronic right heart failure: Secondary | ICD-10-CM

## 2023-11-03 DIAGNOSIS — R600 Localized edema: Secondary | ICD-10-CM

## 2023-11-03 DIAGNOSIS — R0609 Other forms of dyspnea: Secondary | ICD-10-CM | POA: Diagnosis not present

## 2023-11-03 MED ORDER — DAPAGLIFLOZIN PROPANEDIOL 10 MG PO TABS
10.0000 mg | ORAL_TABLET | Freq: Every day | ORAL | 1 refills | Status: AC
Start: 2023-11-03 — End: ?

## 2023-11-03 MED ORDER — LOSARTAN POTASSIUM 50 MG PO TABS: 50.0000 mg | ORAL_TABLET | Freq: Every day | ORAL | 1 refills | Status: AC

## 2023-11-03 MED ORDER — FUROSEMIDE 40 MG PO TABS: 40.0000 mg | ORAL_TABLET | Freq: Every day | ORAL | 3 refills | Status: AC

## 2023-11-03 MED ORDER — SPIRONOLACTONE 25 MG PO TABS: 25.0000 mg | ORAL_TABLET | Freq: Every day | ORAL | 1 refills | Status: AC

## 2023-11-03 MED ORDER — METOPROLOL SUCCINATE ER 50 MG PO TB24: 50.0000 mg | ORAL_TABLET | Freq: Every day | ORAL | 1 refills | Status: AC

## 2023-11-03 NOTE — Progress Notes (Signed)
 [facility] Assessment:    Date:  11/05/2023   ID:  Linda Chase, DOB April 25, 1955, MRN [id number]  PCP:  Sim Emery CROME, MD  Cardiologist:  Vina Gull, MD  Nephrologist:  Referring MD: Sim Emery CROME, MD   CC: Hypertension  History of Present Illness:    Linda Chase is a 68 y.o. female with a hx of PAF, HTN, morbid obesity, OSA, DM2, HTN, cerebral aneurysm s/p craniotomy and clipping, prior tobacco use having quit in 2023, atrial fibrillation, severe TR, COPD, CJD, CHF here to follow up  care in the [facility].   Admitted 08/2022 with acute respiratory failure with hypoxia and hypercapnea in setting of untreated OSA/OHS/CHF/Afib with RVR. Echo LVEF 50%, mildly reduced rVSR, severe TR. Amiodarone  required. Diuresed with IV Lasix  327 lbs ? 285 lbs. ID consulted for degenerative changes vs discitis/osteomyelitis at T8-T9 treated with abx until disc aspirate with no growth.   Seen  12/14/23 by [facility]. Recommended to continue Lasix  40mg  daily, Toprol  50mg  daily, Losartan  increased to 50mg  daily, continue Spironolcatone 25mg  daily. SLGT2i deferred due to concern for GU infection. Afib suspected persistent since 04/2022 with EKG junctional rhythm, recommended ot follow up with Afib clinic and sleep study. She was referred back to general cardiology.   Established wit [facility] 02/17/23. Linda Chase was diagnosed with hypertension in her 25s. Prior tobacco use. Did not have a scale at home for weight monitoring and taking diuretic only once per month. Had self discontinued potassium a month prior. She reported no edema. She had seen [facility] and was waiting home sleep study.  Subsequent lab work with normal kidneys, liver.  CBC no anemia.  BMP no volume overload.  LDL at goal.  She was recommended to resume Lasix  20 mg daily and repeat BMP in 1 to 2 weeks.At that visit she was  recommended for renal artery duplex this was not performed.  She saw A-fib clinic 10/28/2023.  She was maintaining sinus rhythm.  She had self discontinued amiodarone  a few days ago due to concern for side effects.  She reported wearing CPAP regularly.  Alternatives were discussed.  Not candidate for ablation, class Ic, Multaq.  Dofetilide could be considered after amiodarone  washout.  She was advised to continue Eliquis , Toprol .    Presents today for follow up independently. Not taking her fluid pill routinely. Reports she has not been taking as when she goes out in public she has to use the restroom too frequently. No recent dispense of Lasix  per last report. Has not taken in some time. She notes bilateral lower extremity edema. She reports exertional dyspnea which she attributed to Amiodarone  which has been discontinued. However, exertional dyspnea persists. No chest pain, orthopnea, PND, palpitations. Of note her weight in our clinic 02/2023 was 275 lbs and today 297 lbs. She does not have a scale at home nor a BP cuff.   Previous antihypertensives:   Past Medical History:  Diagnosis Date   Arthritis    Cerebral aneurysm    x2   Depression    Diabetes mellitus    Hypertension     Past Surgical History:  Procedure Laterality Date   BRAIN SURGERY     double aneurysm   IR THORACIC DISC ASPIRATION W/IMG GUIDE  09/14/2022   IR THORACIC DISC ASPIRATION W/IMG GUIDE  09/16/2022   RADIOLOGY WITH ANESTHESIA N/A 09/16/2022   Procedure: IR thoracic disc aspiration;  Surgeon: de Macedo Rodrigues, Katyucia, MD;  [facility] OR;  Service: Radiology;  Laterality: N/A;    Current Medications: Current Meds  Medication Sig   acetaminophen  (TYLENOL ) 500 MG tablet Take 1 tablet (500 mg total) by mouth every 6 (six) hours as needed. (Patient taking differently: Take 500 mg by mouth as needed.)   albuterol  (VENTOLIN  HFA) 108 (90 Base) MCG/ACT inhaler Inhale 1-2 puffs into the lungs as needed for wheezing or  shortness of breath.   apixaban  (ELIQUIS ) 5 MG TABS tablet Take 1 tablet (5 mg total) by mouth 2 (two) times daily.   atorvastatin  (LIPITOR) 20 MG tablet Take 1 tablet (20 mg total) by mouth daily.   cholecalciferol  (VITAMIN D ) 1000 units tablet Take 2,000 Units by mouth daily. Gummies   dapagliflozin propanediol (FARXIGA) 10 MG TABS tablet Take 1 tablet (10 mg total) by mouth daily before breakfast. To help prevent fluid retention, strengthen heart muscle, protect kidney function.   diclofenac  Sodium (VOLTAREN ) 1 % GEL Apply 4 g topically 4 (four) times daily.   fluconazole  (DIFLUCAN ) 150 MG tablet Take 1 tablet (150 mg total) by mouth every 3 (three) days as needed for up to 2 doses (prn yeat infection).   furosemide  (LASIX ) 40 MG tablet Take 1 tablet (40 mg total) by mouth daily. To help get rid of fluid.   HYDROcodone -acetaminophen  (NORCO/VICODIN) 5-325 MG tablet Take 1 tablet by mouth every 6 (six) hours as needed for severe pain (pain score 7-10).   JANUVIA  25 MG tablet Take 1 tablet (25 mg total) by mouth daily.   lidocaine  (LIDODERM ) 5 % Place 1 patch onto the skin daily. Remove & Discard patch within 12 hours or as directed by MD (Patient taking differently: Place 1 patch onto the skin as needed. Remove & Discard patch within 12 hours or as directed by MD)   metFORMIN  (GLUCOPHAGE ) 1000 MG tablet Take 1 tablet (1,000 mg total) by mouth 2 (two) times daily with a meal.   senna-docusate (SENOKOT-S) 8.6-50 MG tablet Take 1 tablet by mouth 2 (two) times daily.   [DISCONTINUED] furosemide  (LASIX ) 80 MG tablet Take 0.5 tablets (40 mg total) by mouth daily.   [DISCONTINUED] losartan  (COZAAR ) 50 MG tablet TAKE 1 TABLET EVERY DAY   [DISCONTINUED] metoprolol  succinate (TOPROL -XL) 50 MG 24 hr tablet Take 1 tablet (50 mg total) by mouth daily. Take with or immediately following a meal.   [DISCONTINUED] spironolactone  (ALDACTONE ) 25 MG tablet Take 1 tablet (25 mg total) by mouth daily.     Allergies:    Patient has no known allergies.   Social History   Socioeconomic History   Marital status: Legally Separated    Spouse name: Not on file   Number of children: 2   Years of education: Not on file   Highest education level: 11th grade  Occupational History   Occupation: disabled  Tobacco Use   Smoking status: Former    Types: Cigarettes   Smokeless tobacco: Never   Tobacco comments:    Former smoker 10/12/22  Vaping Use   Vaping status: Never Used  Substance and Sexual Activity   Alcohol use: No   Drug use: No   Sexual activity: Never  Other Topics Concern   Not on file  Social History Narrative   Not on file   Social Drivers of Health   Financial Resource Strain: Low Risk  (11/29/2022)   Overall Financial Resource Strain (CARDIA)    Difficulty of Paying Living Expenses: Not very  hard  Food Insecurity: Food Insecurity Present (11/27/2022)   Hunger Vital Sign    Worried About Running Out of Food in the Last Year: Sometimes true    Ran Out of Food in the Last Year: Sometimes true  Transportation Needs: No Transportation Needs (11/29/2022)   PRAPARE - Administrator, Civil Service (Medical): No    Lack of Transportation (Non-Medical): No  Physical Activity: Not on file  Stress: Not on file  Social Connections: Not on file     Family History: The patient's Family history is unknown by patient.  ROS:   Please see the history of present illness.     All other systems reviewed and are negative.  EKGs/Labs/Other Studies Reviewed:         Recent Labs: 11/27/2022: Magnesium 2.1 02/17/2023: BNP 71.5; TSH 4.300 09/14/2023: ALT 17; BUN 12; Creatinine, Ser 0.96; Hemoglobin 11.8; Platelets 265; Potassium 4.1; Sodium 141   Recent Lipid Panel    Component Value Date/Time   CHOL 89 09/11/2022 0245   TRIG 75 09/11/2022 0245   HDL 42 09/11/2022 0245   CHOLHDL 2.1 09/11/2022 0245   VLDL 15 09/11/2022 0245   LDLCALC 32 09/11/2022 0245   LDLDIRECT 54 02/17/2023  1604    Physical Exam:   VS:  BP 132/80   Pulse 68   Ht 5' 4 (1.626 m)   Wt 297 lb 12.8 oz (135.1 kg)   SpO2 91%   BMI 51.12 kg/m  , BMI Body mass index is 51.12 kg/m. GENERAL:  Well appearing HEENT: Pupils equal round and reactive, fundi not visualized, oral mucosa unremarkable NECK:  No jugular venous distention, waveform within normal limits, carotid upstroke brisk and symmetric, no bruits, no thyromegaly LYMPHATICS:  No cervical adenopathy LUNGS:  Clear to auscultation bilaterally HEART:  RRR.  PMI not displaced or sustained,S1 and S2 within normal limits, no S3, no S4, no clicks, no rubs, no murmurs ABD:  Flat, positive bowel sounds normal in frequency in pitch, no bruits, no rebound, no guarding, no midline pulsatile mass, no hepatomegaly, no splenomegaly EXT:  2 plus pulses throughout, no edema, no cyanosis no clubbing SKIN:  No rashes no nodules NEURO:  Cranial nerves II through XII grossly intact, motor grossly intact throughout PSYCH:  Cognitively intact, oriented to person place and time   ASSESSMENT/PLAN:    HFrEF with RV failure - 08/2022 LVEF 49%, indeterminate diastolic function, RVSF mildly reduced. Nonadherent to Lasix , potassium for some time. Does not have scale nor BP cuff at home for monitoring. Weight gain of 22 lbs over the last 8 months. Suspect multifactorial fluid retention and inactivity.  Resume lasix  40mg  daily Start Farxiga 10mg  daily Labs in 1 week: BMET, CBC, BNP Continue Losartan  50mg  daily, Toprol  50mg  daily, Spironolactone  25mg  daily.   HTN - Not at goal of <130/80. Anticipate BP control will improve with resumption of diuretic. Renal artery duplex previously ordered, not completed, reconsider at follow up. Not addressed at clinic today due to need to adjust diuresis and HF GDMT as above.   Atrial fibrillation / flutter / Amiodarone  therapy / Hypercoagulable state - Persistent since 04/2022. Denies palpitations. Continue to follow with AFib clinic.   CHA2DS2-VASc Score = 6 [CHF History: 1, HTN History: 1, Diabetes History: 1, Stroke History: 0, Vascular Disease History: 1, Age Score: 1, Gender Score: 1].  Therefore, the patient's annual risk of stroke is 9.7 %.    Continue Eliquis  5mg  BID, denies bleeding complications, does not meet  dose reduction criteria.  Amiodarone  monitoring: 02/2023 stable TSH, 09/14/23 normal renal function  Severe TR - Monitor with periodic echocardiogram. Consider repeat echo at follow up pending response to Lasix , as above.   OSA/OHS - endorses wearing CPAP regularly.   Morbid obesity - Weight loss via diet and exercise encouraged. Discussed the impact being overweight would have on cardiovascular risk.  Labs in 1 week: A1c  Screening for Secondary Hypertension:     Relevant Labs/Studies:    Latest Ref Rng & Units 09/14/2023   12:03 PM 05/17/2023    9:55 AM 02/17/2023    4:04 PM  Basic Labs  Sodium 135 - 145 mmol/L 141  141  144   Potassium 3.5 - 5.1 mmol/L 4.1  5.1  4.9   Creatinine 0.44 - 1.00 mg/dL 9.03  8.92  9.08        Latest Ref Rng & Units 02/17/2023    4:04 PM 09/06/2022    1:49 PM  Thyroid   TSH 0.450 - 4.500 uIU/mL 4.300  1.776                 02/17/2023    3:41 PM  Renovascular   Renal Artery US  Completed Yes    Disposition:    FU with MD/APP/PharmD in 4-6 weeks   Medication Adjustments/Labs and Tests Ordered: Current medicines are reviewed at length with the patient today.  Concerns regarding medicines are outlined above.  Orders Placed This Encounter  Procedures   Basic metabolic panel with GFR   CBC   HgB A1c   B Nat Peptide   Meds ordered this encounter  Medications   dapagliflozin propanediol (FARXIGA) 10 MG TABS tablet    Sig: Take 1 tablet (10 mg total) by mouth daily before breakfast. To help prevent fluid retention, strengthen heart muscle, protect kidney function.    Dispense:  90 tablet    Refill:  1    Supervising Provider:   CHRISTOPHER, BRIDGETTE [[id number]]    furosemide  (LASIX ) 40 MG tablet    Sig: Take 1 tablet (40 mg total) by mouth daily. To help get rid of fluid.    Dispense:  90 tablet    Refill:  3    Supervising Provider:   LONNI SLAIN [[id number]]   spironolactone  (ALDACTONE ) 25 MG tablet    Sig: Take 1 tablet (25 mg total) by mouth daily. To help strengthen heart muscle, get rid of extra fluid, help with swelling.    Dispense:  90 tablet    Refill:  1    Supervising Provider:   CHRISTOPHER, BRIDGETTE [[id number]]   metoprolol  succinate (TOPROL -XL) 50 MG 24 hr tablet    Sig: Take 1 tablet (50 mg total) by mouth daily. Take with or immediately following a meal. To help strengthen heart muscle, keep your heart rate regular.    Dispense:  90 tablet    Refill:  1    Supervising Provider:   CHRISTOPHER, BRIDGETTE [[id number]]   losartan  (COZAAR ) 50 MG tablet    Sig: Take 1 tablet (50 mg total) by mouth daily. To help strengthen your heart muscle, keep blood pressure well controlled.    Dispense:  90 tablet    Refill:  1    Supervising Provider:   LONNI SLAIN [[id number]]     Signed, Reche GORMAN Finder, NP  11/05/2023 9:35 AM    [facility]

## 2023-11-03 NOTE — Patient Instructions (Addendum)
 Medication Instructions:   RESUME Furosemide  (Lasix ) 40mg  once per day  START Farxiga (Dapagliflozin) 10mg  once per day   Labwork: Your physician recommends that you return for lab work in 1 week: BMET, CBC, A1c, BNP    Follow-Up: Please follow up in 4-6 weeks in [facility] with Dr. Raford, Reche Finder, NP or Allean Mink PharmD    Special Instructions:    To prevent or reduce lower extremity swelling: Eat a low salt diet. Salt makes the body hold onto extra fluid which causes swelling. Sit with legs elevated. For example, in the recliner or on an ottoman.  Wear knee-high compression stockings during the daytime. Ones labeled 15-20 mmHg provide good compression.   Please take your medication regularly to help strengthen your heart muscle function, prevent your heart from holding onto fluid, improve your swelling. Your medications that help with this are: Farxiga (Dapagliflozin), Furosemide  (Lasix ), Metoprolol , Spironolactone , Losartan 

## 2023-11-03 NOTE — Telephone Encounter (Signed)
 Patient in office for visit with Reche ORN NP today  Doreen started Will forward to Rx Prior auth team to initiate

## 2023-11-04 ENCOUNTER — Other Ambulatory Visit (HOSPITAL_COMMUNITY): Payer: Self-pay

## 2023-11-04 NOTE — Telephone Encounter (Signed)
 Pharmacy Patient Advocate Encounter   Received notification from Physician's Office that prior authorization for FARXIGA is required/requested.   Insurance verification completed.   The patient is insured through [employer].   Per test claim: The current 30 day co-pay is, $0.  No PA needed at this time. This test claim was processed through [facility]- copay amounts may vary at other pharmacies due to pharmacy/plan contracts, or as the patient moves through the different stages of their insurance plan.

## 2023-11-07 NOTE — Progress Notes (Signed)
 Keyry Ladean Coltrain                                          MRN: [id number]   11/07/2023   The [facility] Quality Team Specialist reviewed this patient medical record for the purposes of chart review for care gap closure. The following were reviewed: chart review for care gap closure-kidney health evaluation for diabetes:eGFR  and uACR.    [facility]I Quality Team

## 2023-11-07 NOTE — Telephone Encounter (Signed)
 Rx sent to mail order at visit

## 2023-11-09 ENCOUNTER — Encounter: Payer: Self-pay | Admitting: Family

## 2023-11-14 ENCOUNTER — Other Ambulatory Visit (HOSPITAL_COMMUNITY): Payer: Self-pay | Admitting: Physician Assistant

## 2023-11-16 NOTE — Telephone Encounter (Signed)
 Pt's pharmacy is requesting a refill on medication atorvastatin . This medication was not prescribed by Dr. Okey. Would Dr. Okey like to refill this medication? Please address

## 2023-11-18 ENCOUNTER — Ambulatory Visit (HOSPITAL_BASED_OUTPATIENT_CLINIC_OR_DEPARTMENT_OTHER): Payer: Self-pay | Admitting: Family

## 2023-11-18 LAB — CBC
Hematocrit: 38 % (ref 34.0–46.6)
Hemoglobin: 11.9 g/dL (ref 11.1–15.9)
MCH: 27.4 pg (ref 26.6–33.0)
MCHC: 31.3 g/dL — ABNORMAL LOW (ref 31.5–35.7)
MCV: 87 fL (ref 79–97)
Platelets: 301 x10E3/uL (ref 150–450)
RBC: 4.35 x10E6/uL (ref 3.77–5.28)
RDW: 12.5 % (ref 11.7–15.4)
WBC: 10.1 x10E3/uL (ref 3.4–10.8)

## 2023-11-18 LAB — BASIC METABOLIC PANEL WITH GFR
BUN/Creatinine Ratio: 10 — ABNORMAL LOW (ref 12–28)
BUN: 10 mg/dL (ref 8–27)
CO2: 26 mmol/L (ref 20–29)
Calcium: 9.2 mg/dL (ref 8.7–10.3)
Chloride: 99 mmol/L (ref 96–106)
Creatinine, Ser: 1.03 mg/dL — ABNORMAL HIGH (ref 0.57–1.00)
Glucose: 111 mg/dL — ABNORMAL HIGH (ref 70–99)
Potassium: 4.5 mmol/L (ref 3.5–5.2)
Sodium: 140 mmol/L (ref 134–144)
eGFR: 60 mL/min/1.73 (ref >59)

## 2023-11-18 LAB — HEMOGLOBIN A1C
Est. average glucose Bld gHb Est-mCnc: 157 mg/dL
Hgb A1c MFr Bld: 7.1 % — ABNORMAL HIGH (ref 4.8–5.6)

## 2023-11-18 LAB — BRAIN NATRIURETIC PEPTIDE: BNP: 80.9 pg/mL (ref 0.0–100.0)

## 2023-11-25 ENCOUNTER — Ambulatory Visit
Admission: RE | Admit: 2023-11-25 | Discharge: 2023-11-25 | Disposition: A | Discharge status: Home / Self Care | Source: Ambulatory Visit

## 2023-11-25 DIAGNOSIS — Z1231 Encounter for screening mammogram for malignant neoplasm of breast: Secondary | ICD-10-CM

## 2023-11-29 ENCOUNTER — Encounter (HOSPITAL_BASED_OUTPATIENT_CLINIC_OR_DEPARTMENT_OTHER): Payer: Self-pay | Admitting: Family

## 2023-11-29 ENCOUNTER — Ambulatory Visit (INDEPENDENT_AMBULATORY_CARE_PROVIDER_SITE_OTHER): Admitting: Family

## 2023-11-29 VITALS — BP 137/80 | HR 64 | Ht 64.0 in | Wt 295.4 lb

## 2023-11-29 DIAGNOSIS — I4821 Permanent atrial fibrillation: Secondary | ICD-10-CM

## 2023-11-29 DIAGNOSIS — D6859 Other primary thrombophilia: Secondary | ICD-10-CM

## 2023-11-29 DIAGNOSIS — I10 Essential (primary) hypertension: Secondary | ICD-10-CM

## 2023-11-29 DIAGNOSIS — I5022 Chronic systolic (congestive) heart failure: Secondary | ICD-10-CM

## 2023-11-29 DIAGNOSIS — G4733 Obstructive sleep apnea (adult) (pediatric): Secondary | ICD-10-CM

## 2023-11-29 NOTE — Progress Notes (Signed)
 Advanced Hypertension Clinic Assessment:    Date:  11/29/2023   ID:  Linda Chase, DOB [birth year], MRN [id number]  PCP:  Sim Emery CROME, MD  Cardiologist:  Vina Gull, MD  Nephrologist:  Referring MD: Sim Emery CROME, MD   CC: Hypertension  History of Present Illness:    Linda Chase is a 68 y.o. female with a hx of PAF, HTN, morbid obesity, OSA, DM2, HTN, cerebral aneurysm s/p craniotomy and clipping, prior tobacco use having quit in 2023, atrial fibrillation, severe TR, COPD, CJD, CHF here to follow up  care in the [facility]pertension [facility].   Admitted 08/2022 with acute respiratory failure with hypoxia and hypercapnea in setting of untreated OSA/OHS/CHF/Afib with RVR. Echo LVEF 50%, mildly reduced rVSR, severe TR. Amiodarone  required. Diuresed with IV Lasix  327 lbs ? 285 lbs. ID consulted for degenerative changes vs discitis/osteomyelitis at T8-T9 treated with abx until disc aspirate with no growth.   Seen  12/14/23 by Heart Failure Trans[facility] of Care Clinic. Recommended to continue Lasix  40mg  daily, Toprol  50mg  daily, Losartan  increased to 50mg  daily, continue Spironolcatone 25mg  daily. SLGT2i deferred due to concern for GU infection. Afib suspected persistent since 04/2022 with EKG junctional rhythm, recommended ot follow up with Afib clinic and sleep study. She was referred back to general cardiology.   Established wit [facility] 02/17/23. Linda Chase was diagnosed with hypertension in her 61s. Prior tobacco use. Did not have a scale at home for weight monitoring and taking diuretic only once per month. Had self discontinued potassium a month prior. She reported no edema. She had seen [facility] and was waiting home sleep study.  Subsequent lab work with normal kidneys, liver.  CBC no anemia.  BMP no volume overload.  LDL at goal.  She was recommended to resume Lasix  20 mg daily and repeat BMP in 1 to 2 weeks.At that visit she was  recommended for renal artery duplex this was not performed.  She saw A-fib clinic 10/28/2023.  She was maintaining sinus rhythm.  She had self discontinued amiodarone  a few days ago due to concern for side effects.  She reported wearing CPAP regularly.  Alternatives were discussed.  Not candidate for ablation, class Ic, Multaq.  Dofetilide could be considered after amiodarone  washout.  She was advised to continue Eliquis , Toprol .    Seen by [facility]ension Clinic 11/03/23.  She was not taking Lasix  routinely and noted bilateral lower extremity edema, exertional dyspnea.  Her weight was up 22 pounds over the past 8 months.  Lasix  resumed at 40 mg daily, Farxiga 10 mg daily initiated.  Presents today for follow up independently. Weight down 2 lbs from clinic visit one month ago. Reports she fell a month ago and feels her swelling is related to healing from her fall. She does not recall how she fell, does sound mechanical in nature. She has not been wearing her boot at home routinely as advised by foot doctor (unclear whether podiatry or orthopedics). She was started on Prednisone  last week. No recent BP checks at home. Reports she did not take her Lasix  at all last week due to medical appointment. She is taking her Farxiga as prescribed. She does feel her breathing is a little bit improved on Farxiga.   Previous antihypertensives:   Past Medical History:  Diagnosis Date   Arthritis    Cerebral aneurysm    x2   Depression    Diabetes mellitus    Hypertension  Past Surgical History:  Procedure Laterality Date   BRAIN SURGERY     double aneurysm   IR THORACIC DISC ASPIRATION W/IMG GUIDE  09/14/2022   IR THORACIC DISC ASPIRATION W/IMG GUIDE  09/16/2022   RADIOLOGY WITH ANESTHESIA N/A 09/16/2022   Procedure: IR thoracic disc aspiration;  Surgeon: de Macedo Rodrigues, Katyucia, MD;  Location: [facility] OR;  Service: Radiology;  Laterality: N/A;    Current Medications: No outpatient medications  have been marked as taking for the 11/29/23 encounter (Appointment) with Vannie Reche RAMAN, NP.     Allergies:   Patient has no known allergies.   Social History   Socioeconomic History   Marital status: Legally Separated    Spouse name: Not on file   Number of children: 2   Years of education: Not on file   Highest education level: 11th grade  Occupational History   Occupation: disabled  Tobacco Use   Smoking status: Former    Types: Cigarettes   Smokeless tobacco: Never   Tobacco comments:    Former smoker 10/12/22  Vaping Use   Vaping status: Never Used  Substance and Sexual Activity   Alcohol use: No   Drug use: No   Sexual activity: Never  Other Topics Concern   Not on file  Social History Narrative   Not on file   Social Drivers of Health   Financial Resource Strain: Low Risk  (11/29/2022)   Overall Financial Resource Strain (CARDIA)    Difficulty of Paying Living Expenses: Not very hard  Food Insecurity: Food Insecurity Present (11/27/2022)   Hunger Vital Sign    Worried About Running Out of Food in the Last Year: Sometimes true    Ran Out of Food in the Last Year: Sometimes true  Transportation Needs: No Transportation Needs (11/29/2022)   PRAPARE - Administrator, Civil Service (Medical): No    Lack of Transportation (Non-Medical): No  Physical Activity: Not on file  Stress: Not on file  Social Connections: Not on file     Family History: The patient's family history is negative for Breast cancer.  ROS:   Please see the history of present illness.     All other systems reviewed and are negative.  EKGs/Labs/Other Studies Reviewed:         Recent Labs: 02/17/2023: TSH 4.300 09/14/2023: ALT 17 11/17/2023: BNP 80.9; BUN 10; Creatinine, Ser 1.03; Hemoglobin 11.9; Platelets 301; Potassium 4.5; Sodium 140   Recent Lipid Panel    Component Value Date/Time   CHOL 89 09/11/2022 0245   TRIG 75 09/11/2022 0245   HDL 42 09/11/2022 0245    CHOLHDL 2.1 09/11/2022 0245   VLDL 15 09/11/2022 0245   LDLCALC 32 09/11/2022 0245   LDLDIRECT 54 02/17/2023 1604    Physical Exam:   VS:  There were no vitals taken for this visit. , BMI There is no height or weight on file to calculate BMI. GENERAL:  Well appearing HEENT: Pupils equal round and reactive, fundi not visualized, oral mucosa unremarkable NECK:  No jugular venous distention, waveform within normal limits, carotid upstroke brisk and symmetric, no bruits, no thyromegaly LYMPHATICS:  No cervical adenopathy LUNGS:  Clear to auscultation bilaterally HEART:  RRR.  PMI not displaced or sustained,S1 and S2 within normal limits, no S3, no S4, no clicks, no rubs, no murmurs ABD:  Flat, positive bowel sounds normal in frequency in pitch, no bruits, no rebound, no guarding, no midline pulsatile mass, no hepatomegaly, no splenomegaly EXT:  2 plus pulses throughout, no edema, no cyanosis no clubbing SKIN:  No rashes no nodules NEURO:  Cranial nerves II through XII grossly intact, motor grossly intact throughout PSYCH:  Cognitively intact, oriented to person place and time   ASSESSMENT/PLAN:    HFrEF with RV failure - 08/2022 LVEF 49%, indeterminate diastolic function, RVSF mildly reduced. Weight loss of 2 lbs since last clinic visit. Dyspnea and edema improved with Farxiga. Continues to take Lasix  intermittently, we again reviewed the need for daily dosing. Continue Losartan  50mg  daily, Toprol  50mg  daily, Spironolactone  25mg  daily, Lasix  40mg  daily, Farxiga 10mg  daily.   HTN - Not at goal of <130/80. Anticipate BP control will improve with resumption of diuretic. Discussed to monitor BP at home at least 2 hours after medications and sitting for 5-10 minutes.   Atrial fibrillation / flutter / Amiodarone  therapy / Hypercoagulable state - Persistent since 04/2022. Denies palpitations. Continue to follow with AFib clinic.  CHA2DS2-VASc Score = 6 [CHF History: 1, HTN History: 1, Diabetes  History: 1, Stroke History: 0, Vascular Disease History: 1, Age Score: 1, Gender Score: 1].  Therefore, the patient's annual risk of stroke is 9.7 %.    Continue Eliquis  5mg  BID, denies bleeding complications, does not meet dose reduction criteria.  Amiodarone  monitoring: 02/2023 stable TSH, 09/14/23 normal renal function  Severe TR - Monitor with periodic echocardiogram. As dyspnea, edema improved with medication changes will defer repeat echo at this time.   OSA/OHS - endorses wearing CPAP regularly.   Morbid obesity / DM2 - Weight loss via diet and exercise encouraged. Discussed the impact being overweight would have on cardiovascular risk. 11/17/23 A1c 7.1. upcoming visit with PharmD to discuss GLP1  Screening for Secondary Hypertension:     Relevant Labs/Studies:    Latest Ref Rng & Units 11/17/2023    1:07 PM 09/14/2023   12:03 PM 05/17/2023    9:55 AM  Basic Labs  Sodium 134 - 144 mmol/L 140  141  141   Potassium 3.5 - 5.2 mmol/L 4.5  4.1  5.1   Creatinine 0.57 - 1.00 mg/dL 8.96  9.03  8.92        Latest Ref Rng & Units 02/17/2023    4:04 PM 09/06/2022    1:49 PM  Thyroid   TSH 0.450 - 4.500 uIU/mL 4.300  1.776                 02/17/2023    3:41 PM  Renovascular   Renal Artery US  Completed Yes    Disposition:    FU with Afib Clinic as scheduled and with Dr. Okey or APP in March 2026   Medication Adjustments/Labs and Tests Ordered: Current medicines are reviewed at length with the patient today.  Concerns regarding medicines are outlined above.  No orders of the defined types were placed in this encounter.  No orders of the defined types were placed in this encounter.    Signed, Reche GORMAN Finder, NP  11/29/2023 1:19 PM    [facility]

## 2023-11-29 NOTE — Patient Instructions (Addendum)
 Medication Instructions:  Continue your current medications  Be sure to take your Lasix  (Furosemide ) once per day   You do not need to take a potassium supplement  *If you need a refill on your cardiac medications before your next appointment, please call your pharmacy*  Follow-Up: At [facility], you and your health needs are our priority.  As part of our continuing mission to provide you with exceptional heart care, our providers are all part of one team.  This team includes your primary Cardiologist (physician) and Advanced Practice Providers or APPs (Physician Assistants and Nurse Practitioners) who all work together to provide you with the care you need, when you need it.  Your next appointment:   As scheduled with AFib Clinic In March or April 2026 with Dr. Okey or Reche GORMAN Finder, NP   We recommend signing up for the patient portal called MyChart.  Sign up information is provided on this After Visit Summary.  MyChart is used to connect with patients for Virtual Visits (Telemedicine).  Patients are able to view lab/test results, encounter notes, upcoming appointments, etc.  Non-urgent messages can be sent to your provider as well.   To learn more about what you can do with MyChart, go to [email or web address].   Other Instructions  To prevent or reduce lower extremity swelling: Eat a low salt diet. Salt makes the body hold onto extra fluid which causes swelling. Sit with legs elevated. For example, in the recliner or on an ottoman.  Wear knee-high compression stockings during the daytime. Ones labeled 15-20 mmHg provide good compression.

## 2023-11-30 ENCOUNTER — Telehealth: Payer: Self-pay

## 2023-11-30 ENCOUNTER — Ambulatory Visit: Attending: Cardiovascular Disease

## 2023-11-30 VITALS — Ht 64.0 in | Wt 295.0 lb

## 2023-11-30 DIAGNOSIS — E66813 Obesity, class 3: Secondary | ICD-10-CM | POA: Diagnosis not present

## 2023-11-30 DIAGNOSIS — R609 Edema, unspecified: Secondary | ICD-10-CM | POA: Diagnosis not present

## 2023-11-30 DIAGNOSIS — R0602 Shortness of breath: Secondary | ICD-10-CM | POA: Diagnosis not present

## 2023-11-30 NOTE — Progress Notes (Signed)
 Patient ID: Linda Chase                 DOB: 1955-11-12                    MRN: [id number]     HPI: Linda Chase is a 68 y.o. female patient referred to pharmacy clinic by Quita Kicks to initiate GLP1-RA therapy. PMH is significant for HTN, atrial fibrillation, CAD, HFpEF, COPD, OSA, DM, and obesity. Most recent BMI 50.64 kg/m.  The patient presents today in good spirits. She reports experiencing shortness of breath and swelling, which she attributes to not taking her water pill. She explains that she has avoided the medication because of multiple recent doctor appointments and concerns about frequent bathroom use. I emphasized the importance of not skipping medications and advised her to take her diuretic when she is home for an extended period or for the remainder of the day.  Currently, she is taking metformin , Farxiga , and Januvia  for diabetes management. She is interested in weight loss and lowering her A1c which is slightly above goal at 7.1%. The patient expresses interest in weight loss medications and specifically mentions hearing about Ozempic . She states that several friends from her church are using similar medications and believes it could be beneficial for her.  We discussed that initiating a GLP-1 receptor agonist will replace Januvia , and she should discontinue Januvia  upon starting the GLP-1 therapy.   Education was provided on:  Mechanism of action of GLP-1 medications Potential side effects Administration techniques  To reinforce understanding, we reviewed instructional videos together.   Current weight and BMI: 295 lbs and 50.64 kg/m  Goal weight: 150 lbs Current meds that affect weight: Farxiga , methylprednisolone   Diet: Breakfast: Typically skipped Lunch/Dinner: Egg salad, rotisserie chicken; occasionally fried foods; eats out frequently Beverages: Primarily water  Exercise: Patient reports engaging in frequent walking but limited these past few  days to not taking water pill  Family History:  Relation Problem Comments  Mother (Deceased)   Father (Deceased)   Neg Hx Breast cancer       Social History: Alcohol: none Smoking: former, quit 10/2022  Labs: Lab Results  Component Value Date   HGBA1C 7.1 (H) 11/17/2023    Wt Readings from Last 1 Encounters:  11/29/23 295 lb 6.4 oz (134 kg)    BP Readings from Last 1 Encounters:  11/29/23 137/80   Pulse Readings from Last 1 Encounters:  11/29/23 64       Component Value Date/Time   CHOL 89 09/11/2022 0245   TRIG 75 09/11/2022 0245   HDL 42 09/11/2022 0245   CHOLHDL 2.1 09/11/2022 0245   VLDL 15 09/11/2022 0245   LDLCALC 32 09/11/2022 0245   LDLDIRECT 54 02/17/2023 1604    Past Medical History:  Diagnosis Date   Arthritis    Cerebral aneurysm    x2   Depression    Diabetes mellitus    Hypertension     Current Outpatient Medications on File Prior to Visit  Medication Sig Dispense Refill   acetaminophen  (TYLENOL ) 500 MG tablet Take 1 tablet (500 mg total) by mouth every 6 (six) hours as needed. (Patient taking differently: Take 500 mg by mouth as needed.) 30 tablet 0   albuterol  (VENTOLIN  HFA) 108 (90 Base) MCG/ACT inhaler Inhale 1-2 puffs into the lungs as needed for wheezing or shortness of breath.     amoxicillin (AMOXIL) 500 MG capsule Take 500 mg by mouth 3 (three)  times daily.     apixaban  (ELIQUIS ) 5 MG TABS tablet Take 1 tablet (5 mg total) by mouth 2 (two) times daily. 60 tablet 1   atorvastatin  (LIPITOR) 20 MG tablet TAKE 1 TABLET EVERY DAY 90 tablet 3   cholecalciferol  (VITAMIN D ) 1000 units tablet Take 2,000 Units by mouth daily. Gummies     dapagliflozin propanediol (FARXIGA) 10 MG TABS tablet Take 1 tablet (10 mg total) by mouth daily before breakfast. To help prevent fluid retention, strengthen heart muscle, protect kidney function. 90 tablet 1   diclofenac  Sodium (VOLTAREN ) 1 % GEL Apply 4 g topically 4 (four) times daily. 100 g 0    fluconazole  (DIFLUCAN ) 150 MG tablet Take 1 tablet (150 mg total) by mouth every 3 (three) days as needed for up to 2 doses (prn yeat infection). (Patient not taking: Reported on 11/29/2023) 2 tablet 0   furosemide  (LASIX ) 40 MG tablet Take 1 tablet (40 mg total) by mouth daily. To help get rid of fluid. 90 tablet 3   gabapentin  (NEURONTIN ) 300 MG capsule Take 300 mg by mouth at bedtime.     HYDROcodone -acetaminophen  (NORCO/VICODIN) 5-325 MG tablet Take 1 tablet by mouth every 6 (six) hours as needed for severe pain (pain score 7-10). 8 tablet 0   JANUVIA  25 MG tablet Take 1 tablet (25 mg total) by mouth daily. 30 tablet 0   lidocaine  (LIDODERM ) 5 % Place 1 patch onto the skin daily. Remove & Discard patch within 12 hours or as directed by MD (Patient taking differently: Place 1 patch onto the skin as needed. Remove & Discard patch within 12 hours or as directed by MD) 30 patch 0   losartan  (COZAAR ) 50 MG tablet Take 1 tablet (50 mg total) by mouth daily. To help strengthen your heart muscle, keep blood pressure well controlled. 90 tablet 1   metFORMIN  (GLUCOPHAGE ) 1000 MG tablet Take 1 tablet (1,000 mg total) by mouth 2 (two) times daily with a meal. 60 tablet 0   methocarbamol  (ROBAXIN ) 500 MG tablet Take 1 tablet (500 mg total) by mouth 3 (three) times daily. 60 tablet 0   methylPREDNISolone  (MEDROL  DOSEPAK) 4 MG TBPK tablet Take as directed (Patient not taking: Reported on 11/29/2023) 21 tablet 0   metoprolol  succinate (TOPROL -XL) 50 MG 24 hr tablet Take 1 tablet (50 mg total) by mouth daily. Take with or immediately following a meal. To help strengthen heart muscle, keep your heart rate regular. 90 tablet 1   OVER THE COUNTER MEDICATION Take 1 capsule by mouth daily. Omega XL (Patient not taking: Reported on 11/29/2023)     senna-docusate (SENOKOT-S) 8.6-50 MG tablet Take 1 tablet by mouth 2 (two) times daily. (Patient not taking: Reported on 11/29/2023) 30 tablet 1   spironolactone  (ALDACTONE ) 25  MG tablet Take 1 tablet (25 mg total) by mouth daily. To help strengthen heart muscle, get rid of extra fluid, help with swelling. 90 tablet 1   No current facility-administered medications on file prior to visit.    No Known Allergies   Assessment/Plan:  1. Weight loss - Patient has not met goal of at least 5% of body weight loss with comprehensive lifestyle modifications alone in the past 3-6 months. Pharmacotherapy is appropriate to pursue as augmentation. Will start GLP-1. Confirmed patient not pregnant and no personal or family history of medullary thyroid carcinoma (MTC) or Multiple Endocrine Neoplasia syndrome type 2 (MEN 2). Injection technique reviewed at today's visit.  Advised patient on common side effects  including nausea, diarrhea, dyspepsia, decreased appetite, and fatigue. Counseled patient on reducing meal size and how to titrate medication to minimize side effects. Counseled patient to call if intolerable side effects or if experiencing dehydration, abdominal pain, or dizziness. Along with pharmacotherapy, the patient will follow dietary modifications and aim for at least 150 minutes of moderate-intensity exercise per week, plus resistance training twice a week (as recommended by the American Heart Association). This resistance training--such as weightlifting, bodyweight exercises, or using resistance bands, adapted to the patient's ability--will help prevent muscle loss.  Follow up in 1-2 days regarding coverage of GLP-1 . If therapy is initiated, phone follow-ups will be conducted every 4 weeks for dose titration until the patient reaches the effective therapeutic dose and target weight.  Instructed patient to discontinue Januvia  once GLP-1 agent is approved and received.  Rowyn Mustapha E. Lisbeth Puller, Pharm.D [facility] BIR[facility]. [facility] [address], [place], [name] Phone: [phone]; Fax: [phone]

## 2023-11-30 NOTE — Patient Instructions (Addendum)
 I will give you a call once we know which one is covered under your insurance  Linda Chase, Pharm.D [place]. [facility] [address], [place], [name] Phone: [phone]; Fax: [phone]     GLP-1 Receptor Agonist Counseling Points This medication reduces your appetite and may make you feel fuller longer.  Stop eating when your body tells you that you are full. This will likely happen sooner than you are used to. Fried/greasy food and sweets may upset your stomach - minimize these as much as possible. Store your medication in the fridge until you are ready to use it. Inject your medication in the fatty tissue of your lower abdominal area (2 inches away from belly button) or upper outer thigh. Rotate injection sites. Common side effects include: nausea, diarrhea/constipation, and heartburn, and are more likely to occur if you overeat. Stop your injection for 7 days prior to surgical procedures requiring anesthesia.  Dosing schedule:  We will touch base with you monthly over the phone. The medication can be increased in monthly intervals depending on tolerability and efficacy.  Tips for success: Write down the reasons why you want to lose weight and post it in a place where you'll see it often.  Start small and work your way up. Keep in mind that it takes time to achieve goals, and small steps add up.  Any additional movements help to burn calories. Taking the stairs rather than the elevator and parking at the far end of your parking lot are easy ways to start. Brisk walking for at least 30 minutes 4 or more days of the week is an excellent goal to work toward  Understanding what it means to feel full: Did you know that it can take 15 minutes or more for your brain to receive the message that you've eaten? That means that, if you eat less food, but consume it slower, you may still feel satisfied.  Eating a lot of fruits  and vegetables can also help you feel fuller.  Eat off of smaller plates so that moderate portions don't seem too small  Tips for living a healthier life     Building a Healthy and Balanced Diet Make most of your meal vegetables and fruits -  of your plate. Aim for color and variety, and remember that potatoes don't count as vegetables on the Healthy Eating Plate because of their negative impact on blood sugar.  Go for whole grains -  of your plate. Whole and intact grains--whole wheat, barley, wheat berries, quinoa, oats, brown rice, and foods made with them, such as whole wheat pasta--have a milder effect on blood sugar and insulin  than [name] bread, [name] rice, and other refined grains.  Protein power -  of your plate. Fish, poultry, beans, and nuts are all healthy, versatile protein sources--they can be mixed into salads, and pair well with vegetables on a plate. Limit red meat, and avoid processed meats such as bacon and sausage.  Healthy plant oils - in moderation. Choose healthy vegetable oils like olive, canola, soy, corn, sunflower, peanut, and others, and avoid partially hydrogenated oils, which contain unhealthy trans fats. Remember that low-fat does not mean "healthy."  Drink water, coffee, or tea. Skip sugary drinks, limit milk and dairy products to one to two servings per day, and limit juice to a small glass per day.  Stay active. The red figure running across the Healthy Eating Plate's placemat  is a reminder that staying active is also important in weight control.  The main message of the Healthy Eating Plate is to focus on diet quality:  The type of carbohydrate in the diet is more important than the amount of carbohydrate in the diet, because some sources of carbohydrate--like vegetables (other than potatoes), fruits, whole grains, and beans--are healthier than others. The Healthy Eating Plate also advises consumers to avoid sugary beverages, a major source of  calories--usually with little nutritional value--in the American diet. The Healthy Eating Plate encourages consumers to use healthy oils, and it does not set a maximum on the percentage of calories people should get each day from healthy sources of fat. In this way, the Healthy Eating Plate recommends the opposite of the low-fat message promoted for decades by the USDA.  cuetune.com.ee  SUGAR  Sugar is a huge problem in the modern day diet. Sugar is a big contributor to heart disease, diabetes, high triglyceride levels, fatty liver disease and obesity. Sugar is hidden in almost all packaged foods/beverages. Added sugar is extra sugar that is added beyond what is naturally found and has no nutritional benefit for your body. The American Heart Association recommends limiting added sugars to no more than 25g for women and 36 grams for men per day. There are many names for sugar including maltose, sucrose (names ending in ose), high fructose corn syrup, molasses, cane sugar, corn sweetener, raw sugar, syrup, honey or fruit juice concentrate.   One of the best ways to limit your added sugars is to stop drinking sweetened beverages such as soda, sweet tea, and fruit juice.  There is 65g of added sugars in one 20oz bottle of Coke! That is equal to 7.5 donuts.   Pay attention and read all nutrition facts labels. Below is an examples of a nutrition facts label. The #1 is showing you the total sugars where the # 2 is showing you the added sugars. This one serving has almost the max amount of added sugars per day!   EXERCISE  Exercise is good. We've all heard that. In an ideal world, we would all have time and resources to get plenty of it. When you are active, your heart pumps more efficiently and you will feel better.  Multiple studies show that even walking regularly has benefits that include living a longer life. The American Heart Association  recommends 150 minutes per week of exercise (30 minutes per day most days of the week). You can do this in any increment you wish. Nine or more 10-minute walks count. So does an hour-long exercise class. Break the time apart into what will work in your life. Some of the best things you can do include walking briskly, jogging, cycling or swimming laps. Not everyone is ready to "exercise." Sometimes we need to start with just getting active. Here are some easy ways to be more active throughout the day:  Take the stairs instead of the elevator  Go for a 10-15 minute walk during your lunch break (find a friend to make it more enjoyable)  When shopping, park at the back of the parking lot  If you take public transportation, get off one stop early and walk the extra distance  Pace around while making phone calls  Check with your doctor if you aren't sure what your limitations may be. Always remember to drink plenty of water when doing any type of exercise. Don't feel like a failure if you're not getting the 90-150 minutes  per week. If you started by being a couch potato, then just a 10-minute walk each day is a huge improvement. Start with little victories and work your way up.   HEALTHY EATING TIPS              Plan ahead: make a menu of the meals for a week then create a grocery list to go with that menu. Consider meals that easily stretch into a night of leftovers, such as stews or casseroles. Or consider making two of your favorite meal and put one in the freezer for another night. Try a night or two each week that is "meatless" or "no cook" such as salads. When you get home from the grocery store wash and prepare your vegetables and fruits. Then when you need them they are ready to go.   Tips for going to the grocery store:  Buy store or generic brands  Check the weekly ad from your store on-line or in their in-store flyer  Look at the unit price on the shelf tag to compare/contrast the costs of  different items  Buy fruits/vegetables in season  Carrots, bananas and apples are low-cost, naturally healthy items  If meats or frozen vegetables are on sale, buy some extras and put in your freezer  Limit buying prepared or "ready to eat" items, even if they are pre-made salads or fruit snacks  Do not shop when you're hungry  Foods at eye level tend to be more expensive. Look on the high and low shelves for deals.  Consider shopping at the farmer's market for fresh foods in season.  Avoid the cookie and chip aisles (these are expensive, high in calories and low in nutritional value). Shop on the outside of the grocery store.  Healthy food preparations:  If you can't get lean hamburger, be sure to drain the fat when cooking  Steam, saut (in olive oil), grill or bake foods  Experiment with different seasonings to avoid adding salt to your foods. Kosher salt, sea salt and Himalayan salt are all still salt and should be avoided. Try seasoning food with onion, garlic, thyme, rosemary, basil ect. Onion powder or garlic powder is ok. Avoid if it says salt (ie garlic salt).

## 2023-12-01 ENCOUNTER — Other Ambulatory Visit (HOSPITAL_COMMUNITY): Payer: Self-pay

## 2023-12-01 ENCOUNTER — Telehealth: Payer: Self-pay | Admitting: Pharmacy Technician

## 2023-12-01 ENCOUNTER — Other Ambulatory Visit (HOSPITAL_BASED_OUTPATIENT_CLINIC_OR_DEPARTMENT_OTHER): Payer: Self-pay

## 2023-12-01 MED ORDER — SEMAGLUTIDE(0.25 OR 0.5MG/DOS) 2 MG/3ML ~~LOC~~ SOPN
PEN_INJECTOR | SUBCUTANEOUS | 0 refills | Status: AC
  Filled 2023-12-01: qty 3, 42d supply, fill #0

## 2023-12-01 NOTE — Telephone Encounter (Signed)
   Ran test claim for ozempic . For a 28 day supply and the co-pay is 3.53 . PA is not needed at this time. No message was given stating this is a transition fill.  This test claim was processed through [facility]- copay amounts may vary at other pharmacies due to pharmacy/plan contracts, or as the patient moves through the different stages of their insurance plan.

## 2023-12-01 NOTE — Progress Notes (Signed)
 This patient is appearing on a report for being at risk of failing the adherence measure for cholesterol (statin) medications this calendar year.   Medication: atorvastatin  20 mg daily Last fill date: 11/17/23 for 90 day supply - refills remaining (prescribed by cardiology)  Insurance report was not up to date. No action needed at this time.  Patient is seen by Dr. Sim for primary care (non [facility] patient)  Lorain Baseman, PharmD [facility] [phone]

## 2023-12-01 NOTE — Addendum Note (Signed)
 Addended by: Camarie Mctigue E on: 12/01/2023 02:11 PM   Modules accepted: Orders

## 2023-12-01 NOTE — Telephone Encounter (Addendum)
 Contacted the patient to review Ozempic copay, reminded her of administration technique, and to call for refills. Advised her to take 0.25 mg x 4 weeks and proceed with 0.5 mg x 2 weeks if she is tolerating well. Patient knows first pen should last 6 weeks and to request a refill thereafter. Also, reminded patient to discontinue Januvia  at this time

## 2023-12-01 NOTE — Telephone Encounter (Signed)
 Please see other encounter.

## 2024-01-26 ENCOUNTER — Ambulatory Visit (HOSPITAL_COMMUNITY): Attending: Physician Assistant | Admitting: Physician Assistant

## 2024-01-26 NOTE — Progress Notes (Incomplete)
 "   Primary Care Physician: Sim Emery CROME, MD Primary Cardiologist: Vina Gull, MD Electrophysiologist: None  Referring Physician: Heart Failure TOC   [place] is a 69 y.o. female with a history of OSA, DM, HTN, cerebral aneurysm s/p clipping, COPD, prior tobacco use, atrial fibrillation who presents for follow up in the [facility].  The [place] was [place] in [place] in 04/24 with atrial fibrillation with RVR (uncertain if new). Spontaneously converted to SR.   Presented to ED for evaluation of back pain in 08/24. Admitted with acute respiratory failure with hypoxia and hypercapnia in setting of untreated OSA/OHS/acute CHF and Afib with RVR. Required BiPAP. Echo with EF 50-%, mildly reduced RV, evidence of RV pressure overload w/ flattened interventricular septum in systole, RVSP 44 mmHg, moderate to severe TR. Initially treated with IV diltiazem  but later switched to IV amiodarone . Amiodarone  switched to PO for loading. In atrial fibrillation at discharge. She was diuresed with IV lasix , 327>>285 lb. Her hospital course was further c/b concern for possible degenerative changes vs discitis/osteomyelitis in T8-T9 spine. She was seen by ID. Initially on IV abx. Unable to get MRI d/t prior cerebral aneurysm coiling. Disc aspirate with no growth. Antibiotics discontinued. Patient is on Eliquis  for stroke prevention. She was scheduled for DCCV but converted chemically to SR. She was lost to follow up. She stopped amiodarone  due to concerns about potential side effects.   Patient returns for follow up for atrial fibrillation ***  Today, she  denies symptoms of ***palpitations, chest pain, shortness of breath, orthopnea, PND, lower extremity edema, dizziness, presyncope, syncope, bleeding, or neurologic sequela. The patient is tolerating medications without difficulties and is otherwise without complaint today.    Atrial Fibrillation Risk Factors:  she does have  symptoms or diagnosis of sleep apnea. she does not have a history of rheumatic fever. The patient does have a history of early familial atrial fibrillation or other arrhythmias. Brother has afib.  Atrial Fibrillation Management history:  Previous antiarrhythmic drugs: amiodarone   Previous cardioversions: none Previous ablations: none Anticoagulation history: Eliquis   ROS- All systems are reviewed and negative except as per the HPI above.  Past Medical History:  Diagnosis Date   Arthritis    Cerebral aneurysm    x2   Depression    Diabetes mellitus    Hypertension     Current Outpatient Medications  Medication Sig Dispense Refill   acetaminophen  (TYLENOL ) 500 MG tablet Take 1 tablet (500 mg total) by mouth every 6 (six) hours as needed. (Patient taking differently: Take 500 mg by mouth as needed.) 30 tablet 0   albuterol  (VENTOLIN  HFA) 108 (90 Base) MCG/ACT inhaler Inhale 1-2 puffs into the lungs as needed for wheezing or shortness of breath.     amoxicillin (AMOXIL) 500 MG capsule Take 500 mg by mouth 3 (three) times daily.     apixaban  (ELIQUIS ) 5 MG TABS tablet Take 1 tablet (5 mg total) by mouth 2 (two) times daily. 60 tablet 1   atorvastatin  (LIPITOR) 20 MG tablet TAKE 1 TABLET EVERY DAY 90 tablet 3   cholecalciferol  (VITAMIN D ) 1000 units tablet Take 2,000 Units by mouth daily. Gummies     dapagliflozin  propanediol (FARXIGA ) 10 MG TABS tablet Take 1 tablet (10 mg total) by mouth daily before breakfast. To help prevent fluid retention, strengthen heart muscle, protect kidney function. 90 tablet 1   diclofenac  Sodium (VOLTAREN ) 1 % GEL Apply 4 g topically 4 (four) times daily. 100 g  0   fluconazole  (DIFLUCAN ) 150 MG tablet Take 1 tablet (150 mg total) by mouth every 3 (three) days as needed for up to 2 doses (prn yeat infection). (Patient not taking: Reported on 11/29/2023) 2 tablet 0   furosemide  (LASIX ) 40 MG tablet Take 1 tablet (40 mg total) by mouth daily. To help get rid of  fluid. 90 tablet 3   gabapentin  (NEURONTIN ) 300 MG capsule Take 300 mg by mouth at bedtime.     HYDROcodone -acetaminophen  (NORCO/VICODIN) 5-325 MG tablet Take 1 tablet by mouth every 6 (six) hours as needed for severe pain (pain score 7-10). 8 tablet 0   lidocaine  (LIDODERM ) 5 % Place 1 patch onto the skin daily. Remove & Discard patch within 12 hours or as directed by MD (Patient taking differently: Place 1 patch onto the skin as needed. Remove & Discard patch within 12 hours or as directed by MD) 30 patch 0   losartan  (COZAAR ) 50 MG tablet Take 1 tablet (50 mg total) by mouth daily. To help strengthen your heart muscle, keep blood pressure well controlled. 90 tablet 1   metFORMIN  (GLUCOPHAGE ) 1000 MG tablet Take 1 tablet (1,000 mg total) by mouth 2 (two) times daily with a meal. 60 tablet 0   methocarbamol  (ROBAXIN ) 500 MG tablet Take 1 tablet (500 mg total) by mouth 3 (three) times daily. 60 tablet 0   methylPREDNISolone  (MEDROL  DOSEPAK) 4 MG TBPK tablet Take as directed (Patient not taking: Reported on 11/29/2023) 21 tablet 0   metoprolol  succinate (TOPROL -XL) 50 MG 24 hr tablet Take 1 tablet (50 mg total) by mouth daily. Take with or immediately following a meal. To help strengthen heart muscle, keep your heart rate regular. 90 tablet 1   OVER THE COUNTER MEDICATION Take 1 capsule by mouth daily. Omega XL (Patient not taking: Reported on 11/29/2023)     Semaglutide ,0.25 or 0.5MG /DOS, 2 MG/3ML SOPN Inject 0.25 mg into the skin once a week for 4 weeks then inject 0.5 mg into skin once a week for 2 weeks 3 mL 0   senna-docusate (SENOKOT-S) 8.6-50 MG tablet Take 1 tablet by mouth 2 (two) times daily. (Patient not taking: Reported on 11/29/2023) 30 tablet 1   spironolactone  (ALDACTONE ) 25 MG tablet Take 1 tablet (25 mg total) by mouth daily. To help strengthen heart muscle, get rid of extra fluid, help with swelling. 90 tablet 1   No current facility-administered medications for this visit.     Physical Exam: There were no vitals taken for this visit.  GEN: Well nourished, well developed in no acute distress NECK: No JVD; No carotid bruits*** CARDIAC: {EPRHYTHM:28826}, no murmurs, rubs, gallops RESPIRATORY:  Clear to auscultation without rales, wheezing or rhonchi  ABDOMEN: Soft, non-tender, non-distended EXTREMITIES:  No edema; No deformity    Wt Readings from Last 3 Encounters:  11/30/23 133.8 kg  11/29/23 134 kg  11/03/23 135.1 kg       ***   Echo 09/07/22 demonstrated   1. Left ventricular ejection fraction, by estimation, is 50%. Left  ventricular ejection fraction by 3D volume is 49 %. The left ventricle has  mildly decreased function. The left ventricle demonstrates global  hypokinesis. There is mild left ventricular hypertrophy. Left ventricular diastolic parameters are indeterminate. There is the interventricular septum is flattened in systole, consistent with right ventricular pressure overload.   2. Right ventricular systolic function is mildly reduced. The right  ventricular size is mildly enlarged. Mildly increased right ventricular  wall thickness. There is  mildly elevated pulmonary artery systolic  pressure. The estimated right ventricular  systolic pressure is 44.8 mmHg.   3. Left atrial size was mildly dilated.   4. Right atrial size was mildly dilated.   5. The mitral valve is grossly normal. Trivial mitral valve  regurgitation. No evidence of mitral stenosis. Moderate mitral annular  calcification.   6. There is one beat of systolic flow reversal of hepatic vein spectral  Doppler. Tricuspid valve regurgitation is moderate to severe.   7. Nodular calcification of leaflet tips. The aortic valve is tricuspid.  There is mild calcification of the aortic valve. Aortic valve  regurgitation is trivial. No aortic stenosis is present.   8. Aortic dilatation noted. There is mild dilatation of the ascending  aorta, measuring 42 mm.   9. The inferior  vena cava is dilated in size with <50% respiratory  variability, suggesting right atrial pressure of 15 mmHg.    CHA2DS2-VASc Score = 6  The patient's score is based upon: CHF History: 1 HTN History: 1 Diabetes History: 1 Stroke History: 0 Vascular Disease History: 1 Age Score: 1 Gender Score: 1   {Confirm score is correct.  If not, click here to update score.  REFRESH note.  :1}    ASSESSMENT AND PLAN: Persistent Atrial Fibrillation (ICD10:  I48.19) The patient's CHA2DS2-VASc score is 6, indicating a 9.7% annual risk of stroke.   Patient appears to be maintaining SR*** If further rhythm control is needed, could consider dofetilide admission. Would avoid class IC and Multaq with chronic CHF. BMI is prohibitive for ablation.  Continue Eliquis  5 mg BID Continue Toprol  50 mg daily  Secondary Hypercoagulable State (ICD10:  D68.69){Click to add to Prob List or Visit Dx  :[id number]} The patient is at significant risk for stroke/thromboembolism based upon her CHA2DS2-VASc Score of 6.  Continue Apixaban  (Eliquis ). No bleeding issues.   HTN Stable on current regimen ***  OSA/OHS Encouraged nightly CPAP Diagnosed remotely in [place], had repeat study 02/28/23 at [facility]. 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  Encouraged lifestyle modification On Ozempic  ***   Follow up ***   Daril Kicks PA-C [facility] [address] [place], [name] [phone] "

## 2024-02-02 ENCOUNTER — Other Ambulatory Visit (HOSPITAL_BASED_OUTPATIENT_CLINIC_OR_DEPARTMENT_OTHER): Payer: Self-pay

## 2024-02-02 MED ORDER — HYDROCODONE-ACETAMINOPHEN 5-325 MG PO TABS
1.0000 | ORAL_TABLET | Freq: Four times a day (QID) | ORAL | 0 refills | Status: AC
  Filled 2024-02-02 – 2024-02-08 (×2): qty 40, 10d supply, fill #0

## 2024-02-08 ENCOUNTER — Other Ambulatory Visit (HOSPITAL_BASED_OUTPATIENT_CLINIC_OR_DEPARTMENT_OTHER): Payer: Self-pay

## 2024-04-02 ENCOUNTER — Encounter (HOSPITAL_BASED_OUTPATIENT_CLINIC_OR_DEPARTMENT_OTHER): Admitting: Family
